# Patient Record
Sex: Female | Born: 1965
Health system: Southern US, Community
[De-identification: ages and names within clinical notes are randomized; demographics above are authoritative.]

## PROBLEM LIST (undated history)

## (undated) ENCOUNTER — Ambulatory Visit

## (undated) DIAGNOSIS — E119 Type 2 diabetes mellitus without complications: Secondary | ICD-10-CM

## (undated) DIAGNOSIS — E785 Hyperlipidemia, unspecified: Secondary | ICD-10-CM

## (undated) DIAGNOSIS — K5900 Constipation, unspecified: Secondary | ICD-10-CM

## (undated) DIAGNOSIS — K219 Gastro-esophageal reflux disease without esophagitis: Secondary | ICD-10-CM

## (undated) DIAGNOSIS — K649 Unspecified hemorrhoids: Secondary | ICD-10-CM

## (undated) DIAGNOSIS — R42 Dizziness and giddiness: Secondary | ICD-10-CM

## (undated) DIAGNOSIS — K603 Anal fistula: Principal | ICD-10-CM

## (undated) DIAGNOSIS — J302 Other seasonal allergic rhinitis: Secondary | ICD-10-CM

## (undated) DIAGNOSIS — D649 Anemia, unspecified: Secondary | ICD-10-CM

## (undated) DIAGNOSIS — I10 Essential (primary) hypertension: Secondary | ICD-10-CM

## (undated) HISTORY — DX: Anemia, unspecified: D64.9

## (undated) HISTORY — DX: Unspecified hemorrhoids: K64.9

## (undated) HISTORY — DX: Essential (primary) hypertension: I10

## (undated) HISTORY — PX: OTHER SURGICAL HISTORY: SHX169

## (undated) HISTORY — DX: Gastro-esophageal reflux disease without esophagitis: K21.9

---

## 1998-09-02 ENCOUNTER — Emergency Department (HOSPITAL_COMMUNITY): Admission: EM | Admit: 1998-09-02 | Discharge: 1998-09-02 | Payer: Self-pay | Admitting: Emergency Medicine

## 1999-10-05 ENCOUNTER — Encounter: Payer: Self-pay | Admitting: Emergency Medicine

## 1999-10-05 ENCOUNTER — Emergency Department (HOSPITAL_COMMUNITY): Admission: EM | Admit: 1999-10-05 | Discharge: 1999-10-05 | Payer: Self-pay | Admitting: Emergency Medicine

## 1999-10-07 ENCOUNTER — Emergency Department (HOSPITAL_COMMUNITY): Admission: EM | Admit: 1999-10-07 | Discharge: 1999-10-07 | Payer: Self-pay | Admitting: Emergency Medicine

## 1999-10-09 ENCOUNTER — Emergency Department (HOSPITAL_COMMUNITY): Admission: EM | Admit: 1999-10-09 | Discharge: 1999-10-09 | Payer: Self-pay | Admitting: Emergency Medicine

## 2000-10-26 ENCOUNTER — Emergency Department (HOSPITAL_COMMUNITY): Admission: EM | Admit: 2000-10-26 | Discharge: 2000-10-26 | Payer: Self-pay

## 2001-12-04 ENCOUNTER — Ambulatory Visit (HOSPITAL_COMMUNITY): Admission: RE | Admit: 2001-12-04 | Discharge: 2001-12-04 | Payer: Self-pay | Admitting: Internal Medicine

## 2004-03-30 ENCOUNTER — Other Ambulatory Visit: Admission: RE | Admit: 2004-03-30 | Discharge: 2004-03-30 | Payer: Self-pay | Admitting: Obstetrics and Gynecology

## 2004-11-10 ENCOUNTER — Ambulatory Visit: Payer: Self-pay | Admitting: Internal Medicine

## 2005-04-18 ENCOUNTER — Other Ambulatory Visit: Admission: RE | Admit: 2005-04-18 | Discharge: 2005-04-18 | Payer: Self-pay | Admitting: Obstetrics and Gynecology

## 2005-07-13 ENCOUNTER — Ambulatory Visit (HOSPITAL_COMMUNITY): Admission: RE | Admit: 2005-07-13 | Discharge: 2005-07-13 | Payer: Self-pay | Admitting: Obstetrics and Gynecology

## 2006-02-01 ENCOUNTER — Other Ambulatory Visit: Admission: RE | Admit: 2006-02-01 | Discharge: 2006-02-01 | Payer: Self-pay | Admitting: Obstetrics and Gynecology

## 2006-08-01 ENCOUNTER — Ambulatory Visit (HOSPITAL_COMMUNITY): Admission: RE | Admit: 2006-08-01 | Discharge: 2006-08-01 | Payer: Self-pay | Admitting: Obstetrics and Gynecology

## 2007-01-10 ENCOUNTER — Ambulatory Visit (HOSPITAL_COMMUNITY): Admission: RE | Admit: 2007-01-10 | Discharge: 2007-01-10 | Payer: Self-pay | Admitting: Obstetrics and Gynecology

## 2007-01-10 ENCOUNTER — Encounter (INDEPENDENT_AMBULATORY_CARE_PROVIDER_SITE_OTHER): Payer: Self-pay | Admitting: Specialist

## 2008-01-15 ENCOUNTER — Emergency Department (HOSPITAL_COMMUNITY): Admission: EM | Admit: 2008-01-15 | Discharge: 2008-01-15 | Payer: Self-pay | Admitting: Emergency Medicine

## 2008-10-25 ENCOUNTER — Emergency Department (HOSPITAL_COMMUNITY): Admission: EM | Admit: 2008-10-25 | Discharge: 2008-10-25 | Payer: Self-pay | Admitting: Emergency Medicine

## 2009-01-30 ENCOUNTER — Inpatient Hospital Stay (HOSPITAL_COMMUNITY): Admission: EM | Admit: 2009-01-30 | Discharge: 2009-02-01 | Payer: Self-pay | Admitting: Emergency Medicine

## 2009-01-31 ENCOUNTER — Ambulatory Visit: Payer: Self-pay | Admitting: Gastroenterology

## 2009-01-31 ENCOUNTER — Encounter: Payer: Self-pay | Admitting: Gastroenterology

## 2009-02-01 ENCOUNTER — Ambulatory Visit: Payer: Self-pay | Admitting: Oncology

## 2009-02-09 LAB — CBC & DIFF AND RETIC
BASO%: 1.5 % (ref 0.0–2.0)
HCT: 35.2 % (ref 34.8–46.6)
IRF: 0.36 — ABNORMAL HIGH (ref 0.130–0.330)
MCHC: 33.8 g/dL (ref 31.5–36.0)
MONO#: 0.6 10*3/uL (ref 0.1–0.9)
RBC: 4.09 10*6/uL (ref 3.70–5.45)
RDW: 15.1 % — ABNORMAL HIGH (ref 11.2–14.5)
RETIC #: 40.9 10*3/uL (ref 19.7–115.1)
Retic %: 1 % (ref 0.4–2.3)
WBC: 7.2 10*3/uL (ref 3.9–10.3)
lymph#: 2 10*3/uL (ref 0.9–3.3)

## 2009-02-09 LAB — CHCC SMEAR

## 2009-02-09 LAB — MORPHOLOGY

## 2009-02-11 LAB — IMMUNOFIXATION ELECTROPHORESIS
IgA: 199 mg/dL (ref 68–378)
IgM, Serum: 149 mg/dL (ref 60–263)

## 2009-02-11 LAB — IRON AND TIBC: Iron: 52 ug/dL (ref 42–145)

## 2009-02-24 ENCOUNTER — Emergency Department (HOSPITAL_COMMUNITY): Admission: EM | Admit: 2009-02-24 | Discharge: 2009-02-24 | Payer: Self-pay | Admitting: Emergency Medicine

## 2009-03-11 ENCOUNTER — Ambulatory Visit: Payer: Self-pay | Admitting: Internal Medicine

## 2009-03-11 DIAGNOSIS — D509 Iron deficiency anemia, unspecified: Secondary | ICD-10-CM

## 2009-03-11 DIAGNOSIS — J309 Allergic rhinitis, unspecified: Secondary | ICD-10-CM | POA: Insufficient documentation

## 2009-03-11 DIAGNOSIS — R51 Headache: Secondary | ICD-10-CM

## 2009-03-11 DIAGNOSIS — R55 Syncope and collapse: Secondary | ICD-10-CM | POA: Insufficient documentation

## 2009-03-11 DIAGNOSIS — R519 Headache, unspecified: Secondary | ICD-10-CM | POA: Insufficient documentation

## 2009-03-18 ENCOUNTER — Ambulatory Visit: Payer: Self-pay | Admitting: Internal Medicine

## 2009-03-18 ENCOUNTER — Observation Stay (HOSPITAL_COMMUNITY): Admission: EM | Admit: 2009-03-18 | Discharge: 2009-03-20 | Payer: Self-pay | Admitting: Emergency Medicine

## 2009-03-22 ENCOUNTER — Encounter: Payer: Self-pay | Admitting: Internal Medicine

## 2009-04-19 ENCOUNTER — Emergency Department (HOSPITAL_COMMUNITY): Admission: EM | Admit: 2009-04-19 | Discharge: 2009-04-19 | Payer: Self-pay | Admitting: Emergency Medicine

## 2009-04-22 ENCOUNTER — Ambulatory Visit: Payer: Self-pay | Admitting: Internal Medicine

## 2009-04-22 ENCOUNTER — Ambulatory Visit: Payer: Self-pay | Admitting: Oncology

## 2009-04-26 LAB — CBC & DIFF AND RETIC
Basophils Absolute: 0.1 10*3/uL (ref 0.0–0.1)
EOS%: 2.2 % (ref 0.0–7.0)
HGB: 13 g/dL (ref 11.6–15.9)
IRF: 0.32 (ref 0.130–0.330)
MCH: 30.9 pg (ref 25.1–34.0)
NEUT#: 3.1 10*3/uL (ref 1.5–6.5)
RDW: 15.9 % — ABNORMAL HIGH (ref 11.2–14.5)
RETIC #: 60.5 10*3/uL (ref 19.7–115.1)
lymph#: 1.9 10*3/uL (ref 0.9–3.3)

## 2009-04-26 LAB — IRON AND TIBC: TIBC: 351 ug/dL (ref 250–470)

## 2009-04-28 ENCOUNTER — Ambulatory Visit (HOSPITAL_COMMUNITY): Admission: RE | Admit: 2009-04-28 | Discharge: 2009-04-28 | Payer: Self-pay | Admitting: General Surgery

## 2009-04-28 ENCOUNTER — Encounter (INDEPENDENT_AMBULATORY_CARE_PROVIDER_SITE_OTHER): Payer: Self-pay | Admitting: General Surgery

## 2009-04-28 HISTORY — PX: HEMORRHOID SURGERY: SHX153

## 2009-05-19 ENCOUNTER — Telehealth: Payer: Self-pay | Admitting: Internal Medicine

## 2009-05-19 ENCOUNTER — Ambulatory Visit: Payer: Self-pay | Admitting: Internal Medicine

## 2009-05-24 ENCOUNTER — Telehealth: Payer: Self-pay | Admitting: Internal Medicine

## 2009-05-31 ENCOUNTER — Telehealth: Payer: Self-pay | Admitting: Internal Medicine

## 2009-06-23 ENCOUNTER — Ambulatory Visit: Payer: Self-pay | Admitting: Internal Medicine

## 2009-06-23 DIAGNOSIS — IMO0001 Reserved for inherently not codable concepts without codable children: Secondary | ICD-10-CM

## 2009-06-24 ENCOUNTER — Ambulatory Visit: Payer: Self-pay | Admitting: Oncology

## 2009-06-28 LAB — CBC WITH DIFFERENTIAL/PLATELET
BASO%: 0.9 % (ref 0.0–2.0)
EOS%: 2.1 % (ref 0.0–7.0)
Eosinophils Absolute: 0.1 10*3/uL (ref 0.0–0.5)
HCT: 41.5 % (ref 34.8–46.6)
HGB: 14.1 g/dL (ref 11.6–15.9)
MCV: 91.1 fL (ref 79.5–101.0)
MONO#: 0.4 10*3/uL (ref 0.1–0.9)
NEUT#: 2.3 10*3/uL (ref 1.5–6.5)
NEUT%: 47.4 % (ref 38.4–76.8)
Platelets: 280 10*3/uL (ref 145–400)
WBC: 4.8 10*3/uL (ref 3.9–10.3)

## 2009-06-29 ENCOUNTER — Telehealth: Payer: Self-pay | Admitting: Internal Medicine

## 2009-06-29 ENCOUNTER — Emergency Department (HOSPITAL_COMMUNITY): Admission: EM | Admit: 2009-06-29 | Discharge: 2009-06-29 | Payer: Self-pay | Admitting: Emergency Medicine

## 2009-06-29 DIAGNOSIS — R609 Edema, unspecified: Secondary | ICD-10-CM | POA: Insufficient documentation

## 2009-06-30 ENCOUNTER — Encounter (INDEPENDENT_AMBULATORY_CARE_PROVIDER_SITE_OTHER): Payer: Self-pay | Admitting: *Deleted

## 2009-07-01 ENCOUNTER — Telehealth: Payer: Self-pay | Admitting: Internal Medicine

## 2009-07-07 ENCOUNTER — Ambulatory Visit: Payer: Self-pay | Admitting: Internal Medicine

## 2009-07-07 DIAGNOSIS — T148XXA Other injury of unspecified body region, initial encounter: Secondary | ICD-10-CM

## 2009-07-14 ENCOUNTER — Telehealth: Payer: Self-pay | Admitting: Internal Medicine

## 2009-07-21 ENCOUNTER — Ambulatory Visit: Payer: Self-pay | Admitting: Internal Medicine

## 2009-07-28 ENCOUNTER — Telehealth: Payer: Self-pay | Admitting: Internal Medicine

## 2009-08-01 ENCOUNTER — Ambulatory Visit: Payer: Self-pay | Admitting: Surgery

## 2009-08-01 ENCOUNTER — Encounter: Payer: Self-pay | Admitting: Internal Medicine

## 2009-08-10 ENCOUNTER — Ambulatory Visit: Payer: Self-pay | Admitting: Internal Medicine

## 2009-08-17 ENCOUNTER — Emergency Department (HOSPITAL_COMMUNITY): Admission: EM | Admit: 2009-08-17 | Discharge: 2009-08-17 | Payer: Self-pay | Admitting: Emergency Medicine

## 2009-08-19 ENCOUNTER — Ambulatory Visit: Payer: Self-pay | Admitting: Oncology

## 2009-08-23 ENCOUNTER — Encounter: Admission: RE | Admit: 2009-08-23 | Discharge: 2009-08-23 | Payer: Self-pay | Admitting: Cardiology

## 2009-09-05 LAB — CBC & DIFF AND RETIC
BASO%: 1.6 % (ref 0.0–2.0)
Basophils Absolute: 0.1 10*3/uL (ref 0.0–0.1)
EOS%: 2.5 % (ref 0.0–7.0)
HGB: 13.8 g/dL (ref 11.6–15.9)
Immature Retic Fract: 1.7 % (ref 0.00–10.70)
MCH: 30.7 pg (ref 25.1–34.0)
MONO%: 7.1 % (ref 0.0–14.0)
RBC: 4.49 10*6/uL (ref 3.70–5.45)
RDW: 13 % (ref 11.2–14.5)
Retic %: 1.01 % (ref 0.50–1.50)
Retic Ct Abs: 45.35 10*3/uL (ref 18.30–72.70)
lymph#: 2.1 10*3/uL (ref 0.9–3.3)

## 2009-09-05 LAB — IRON AND TIBC: TIBC: 306 ug/dL (ref 250–470)

## 2009-09-15 ENCOUNTER — Encounter: Admission: RE | Admit: 2009-09-15 | Discharge: 2009-11-02 | Payer: Self-pay | Admitting: Cardiology

## 2009-12-13 ENCOUNTER — Ambulatory Visit (HOSPITAL_COMMUNITY): Admission: RE | Admit: 2009-12-13 | Discharge: 2009-12-13 | Payer: Self-pay | Admitting: Neurosurgery

## 2009-12-16 ENCOUNTER — Encounter: Admission: RE | Admit: 2009-12-16 | Discharge: 2009-12-16 | Payer: Self-pay | Admitting: Neurosurgery

## 2010-01-04 ENCOUNTER — Emergency Department (HOSPITAL_COMMUNITY): Admission: EM | Admit: 2010-01-04 | Discharge: 2010-01-04 | Payer: Self-pay | Admitting: Emergency Medicine

## 2010-01-24 ENCOUNTER — Other Ambulatory Visit: Admission: RE | Admit: 2010-01-24 | Discharge: 2010-01-24 | Payer: Self-pay | Admitting: Obstetrics and Gynecology

## 2010-04-19 ENCOUNTER — Ambulatory Visit (HOSPITAL_COMMUNITY): Admission: RE | Admit: 2010-04-19 | Discharge: 2010-04-19 | Payer: Self-pay | Admitting: Obstetrics and Gynecology

## 2010-04-21 ENCOUNTER — Ambulatory Visit (HOSPITAL_COMMUNITY): Admission: RE | Admit: 2010-04-21 | Discharge: 2010-04-21 | Payer: Self-pay | Admitting: Orthopedic Surgery

## 2010-05-16 ENCOUNTER — Encounter: Admission: RE | Admit: 2010-05-16 | Discharge: 2010-05-16 | Payer: Self-pay | Admitting: Orthopedic Surgery

## 2010-12-31 ENCOUNTER — Inpatient Hospital Stay (INDEPENDENT_AMBULATORY_CARE_PROVIDER_SITE_OTHER)
Admission: RE | Admit: 2010-12-31 | Discharge: 2010-12-31 | Disposition: A | Discharge status: Home / Self Care | Payer: BC Managed Care – PPO | Source: Ambulatory Visit | Attending: Emergency Medicine | Admitting: Emergency Medicine

## 2010-12-31 DIAGNOSIS — H9209 Otalgia, unspecified ear: Secondary | ICD-10-CM

## 2010-12-31 DIAGNOSIS — J029 Acute pharyngitis, unspecified: Secondary | ICD-10-CM

## 2011-01-19 ENCOUNTER — Other Ambulatory Visit (HOSPITAL_COMMUNITY)
Admission: RE | Admit: 2011-01-19 | Discharge: 2011-01-19 | Disposition: A | Discharge status: Home / Self Care | Payer: BC Managed Care – PPO | Source: Ambulatory Visit | Attending: Obstetrics and Gynecology | Admitting: Obstetrics and Gynecology

## 2011-01-19 ENCOUNTER — Other Ambulatory Visit: Payer: Self-pay | Admitting: Obstetrics and Gynecology

## 2011-01-19 DIAGNOSIS — Z113 Encounter for screening for infections with a predominantly sexual mode of transmission: Secondary | ICD-10-CM | POA: Insufficient documentation

## 2011-01-19 DIAGNOSIS — Z01419 Encounter for gynecological examination (general) (routine) without abnormal findings: Secondary | ICD-10-CM | POA: Insufficient documentation

## 2011-02-08 LAB — CK TOTAL AND CKMB (NOT AT ARMC)
CK, MB: 0.6 ng/mL (ref 0.3–4.0)
Total CK: 45 U/L (ref 7–177)

## 2011-02-12 LAB — CBC
HCT: 39.9 % (ref 36.0–46.0)
Hemoglobin: 13.5 g/dL (ref 12.0–15.0)
MCHC: 33.8 g/dL (ref 30.0–36.0)
MCV: 89.9 fL (ref 78.0–100.0)
RBC: 4.44 MIL/uL (ref 3.87–5.11)
WBC: 5.8 10*3/uL (ref 4.0–10.5)

## 2011-02-12 LAB — URINALYSIS, ROUTINE W REFLEX MICROSCOPIC
Hgb urine dipstick: NEGATIVE
Ketones, ur: NEGATIVE mg/dL
Specific Gravity, Urine: 1.025 (ref 1.005–1.030)
Urobilinogen, UA: 0.2 mg/dL (ref 0.0–1.0)

## 2011-02-12 LAB — DIFFERENTIAL
Basophils Relative: 1 % (ref 0–1)
Eosinophils Absolute: 0.1 10*3/uL (ref 0.0–0.7)
Eosinophils Relative: 2 % (ref 0–5)
Monocytes Relative: 6 % (ref 3–12)

## 2011-02-12 LAB — POCT I-STAT, CHEM 8
Chloride: 104 mEq/L (ref 96–112)
Creatinine, Ser: 0.7 mg/dL (ref 0.4–1.2)
HCT: 37 % (ref 36.0–46.0)
Potassium: 3.6 mEq/L (ref 3.5–5.1)
TCO2: 25 mmol/L (ref 0–100)

## 2011-02-12 LAB — PREGNANCY, URINE: Preg Test, Ur: NEGATIVE

## 2011-02-12 LAB — URINE CULTURE

## 2011-02-13 LAB — URINALYSIS, ROUTINE W REFLEX MICROSCOPIC
Bilirubin Urine: NEGATIVE
Ketones, ur: NEGATIVE mg/dL
Nitrite: NEGATIVE
Protein, ur: NEGATIVE mg/dL
pH: 6.5 (ref 5.0–8.0)

## 2011-02-13 LAB — COMPREHENSIVE METABOLIC PANEL
ALT: 11 U/L (ref 0–35)
AST: 15 U/L (ref 0–37)
AST: 16 U/L (ref 0–37)
Albumin: 3.4 g/dL — ABNORMAL LOW (ref 3.5–5.2)
Albumin: 3.5 g/dL (ref 3.5–5.2)
Alkaline Phosphatase: 37 U/L — ABNORMAL LOW (ref 39–117)
Alkaline Phosphatase: 41 U/L (ref 39–117)
BUN: 2 mg/dL — ABNORMAL LOW (ref 6–23)
Creatinine, Ser: 0.61 mg/dL (ref 0.4–1.2)
GFR calc Af Amer: >60 mL/min (ref >60)
Potassium: 3.7 mEq/L (ref 3.5–5.1)
Potassium: 3.8 mEq/L (ref 3.5–5.1)
Sodium: 140 mEq/L (ref 135–145)
Total Protein: 6.6 g/dL (ref 6.0–8.3)
Total Protein: 6.6 g/dL (ref 6.0–8.3)

## 2011-02-13 LAB — CBC
HCT: 36.5 % (ref 36.0–46.0)
Hemoglobin: 11.6 g/dL — ABNORMAL LOW (ref 12.0–15.0)
MCHC: 34 g/dL (ref 30.0–36.0)
MCHC: 34.3 g/dL (ref 30.0–36.0)
MCV: 87.3 fL (ref 78.0–100.0)
MCV: 87.5 fL (ref 78.0–100.0)
Platelets: 191 10*3/uL (ref 150–400)
RBC: 4.15 MIL/uL (ref 3.87–5.11)
RDW: 17.3 % — ABNORMAL HIGH (ref 11.5–15.5)
RDW: 17.9 % — ABNORMAL HIGH (ref 11.5–15.5)
RDW: 18 % — ABNORMAL HIGH (ref 11.5–15.5)
WBC: 4.3 10*3/uL (ref 4.0–10.5)
WBC: 6 10*3/uL (ref 4.0–10.5)

## 2011-02-13 LAB — POCT I-STAT, CHEM 8
BUN: 3 mg/dL — ABNORMAL LOW (ref 6–23)
Calcium, Ion: 1.15 mmol/L (ref 1.12–1.32)
HCT: 39 % (ref 36.0–46.0)
Hemoglobin: 13.3 g/dL (ref 12.0–15.0)
Sodium: 141 mEq/L (ref 135–145)
TCO2: 23 mmol/L (ref 0–100)

## 2011-02-13 LAB — IRON AND TIBC
Saturation Ratios: 21 % (ref 20–55)
TIBC: 357 ug/dL (ref 250–470)

## 2011-02-13 LAB — DIFFERENTIAL
Basophils Absolute: 0.1 10*3/uL (ref 0.0–0.1)
Basophils Relative: 1 % (ref 0–1)
Eosinophils Absolute: 0.1 10*3/uL (ref 0.0–0.7)
Eosinophils Relative: 2 % (ref 0–5)
Lymphocytes Relative: 37 % (ref 12–46)
Lymphs Abs: 1.6 10*3/uL (ref 0.7–4.0)
Monocytes Relative: 6 % (ref 3–12)
Neutrophils Relative %: 53 % (ref 43–77)

## 2011-02-13 LAB — CK: Total CK: 55 U/L (ref 7–177)

## 2011-02-13 LAB — CK TOTAL AND CKMB (NOT AT ARMC)
CK, MB: 0.5 ng/mL (ref 0.3–4.0)
Total CK: 54 U/L (ref 7–177)

## 2011-02-13 LAB — FERRITIN: Ferritin: 32 ng/mL (ref 10–291)

## 2011-02-13 LAB — TYPE AND SCREEN
ABO/RH(D): O POS
Antibody Screen: NEGATIVE

## 2011-02-13 LAB — LIPID PANEL
LDL Cholesterol: 117 mg/dL — ABNORMAL HIGH (ref 0–99)
VLDL: 13 mg/dL (ref 0–40)

## 2011-02-13 LAB — HEMOCCULT GUIAC POC 1CARD (OFFICE): Fecal Occult Bld: POSITIVE

## 2011-02-13 LAB — FOLATE: Folate: >20 ng/mL

## 2011-02-13 LAB — GC/CHLAMYDIA PROBE AMP, URINE: Chlamydia, Swab/Urine, PCR: NEGATIVE

## 2011-02-13 LAB — PROTIME-INR: INR: 1 (ref 0.00–1.49)

## 2011-02-13 LAB — LACTIC ACID, PLASMA: Lactic Acid, Venous: 1.1 mmol/L (ref 0.5–2.2)

## 2011-02-14 LAB — RAPID URINE DRUG SCREEN, HOSP PERFORMED
Amphetamines: NOT DETECTED
Barbiturates: NOT DETECTED
Cocaine: NOT DETECTED
Opiates: NOT DETECTED
Tetrahydrocannabinol: NOT DETECTED

## 2011-02-14 LAB — POCT I-STAT, CHEM 8
Chloride: 108 mEq/L (ref 96–112)
Glucose, Bld: 97 mg/dL (ref 70–99)
HCT: 36 % (ref 36.0–46.0)
Potassium: 4.2 mEq/L (ref 3.5–5.1)

## 2011-02-14 LAB — URINALYSIS, ROUTINE W REFLEX MICROSCOPIC
Bilirubin Urine: NEGATIVE
Leukocytes, UA: NEGATIVE
Nitrite: NEGATIVE
Specific Gravity, Urine: 1.021 (ref 1.005–1.030)
pH: 5.5 (ref 5.0–8.0)

## 2011-02-14 LAB — DIFFERENTIAL
Basophils Absolute: 0.1 10*3/uL (ref 0.0–0.1)
Basophils Relative: 1 % (ref 0–1)
Eosinophils Relative: 2 % (ref 0–5)
Monocytes Absolute: 0.3 10*3/uL (ref 0.1–1.0)

## 2011-02-14 LAB — URINE MICROSCOPIC-ADD ON

## 2011-02-14 LAB — CBC
HCT: 34.3 % — ABNORMAL LOW (ref 36.0–46.0)
Hemoglobin: 11.8 g/dL — ABNORMAL LOW (ref 12.0–15.0)
MCHC: 34.5 g/dL (ref 30.0–36.0)
Platelets: 465 10*3/uL — ABNORMAL HIGH (ref 150–400)
RDW: 15.9 % — ABNORMAL HIGH (ref 11.5–15.5)

## 2011-02-15 LAB — COMPREHENSIVE METABOLIC PANEL
ALT: 11 U/L (ref 0–35)
AST: 19 U/L (ref 0–37)
Albumin: 3.5 g/dL (ref 3.5–5.2)
Alkaline Phosphatase: 30 U/L — ABNORMAL LOW (ref 39–117)
BUN: 3 mg/dL — ABNORMAL LOW (ref 6–23)
Creatinine, Ser: 0.63 mg/dL (ref 0.4–1.2)
GFR calc non Af Amer: >60 mL/min (ref >60)
Glucose, Bld: 118 mg/dL — ABNORMAL HIGH (ref 70–99)
Total Bilirubin: 1.1 mg/dL (ref 0.3–1.2)

## 2011-02-15 LAB — HEMOGLOBIN AND HEMATOCRIT, BLOOD
HCT: 26.9 % — ABNORMAL LOW (ref 36.0–46.0)
HCT: 31.9 % — ABNORMAL LOW (ref 36.0–46.0)
Hemoglobin: 10.7 g/dL — ABNORMAL LOW (ref 12.0–15.0)
Hemoglobin: 7.9 g/dL — CL (ref 12.0–15.0)
Hemoglobin: 8.9 g/dL — ABNORMAL LOW (ref 12.0–15.0)

## 2011-02-15 LAB — CBC
HCT: 19.8 % — ABNORMAL LOW (ref 36.0–46.0)
HCT: 28.8 % — ABNORMAL LOW (ref 36.0–46.0)
Hemoglobin: 6.5 g/dL — CL (ref 12.0–15.0)
Hemoglobin: 9.4 g/dL — ABNORMAL LOW (ref 12.0–15.0)
MCHC: 32.5 g/dL (ref 30.0–36.0)
MCHC: 33 g/dL (ref 30.0–36.0)
MCV: 86.8 fL (ref 78.0–100.0)
RBC: 2.28 MIL/uL — ABNORMAL LOW (ref 3.87–5.11)
RDW: 15.4 % (ref 11.5–15.5)

## 2011-02-15 LAB — URINALYSIS, ROUTINE W REFLEX MICROSCOPIC
Bilirubin Urine: NEGATIVE
Ketones, ur: NEGATIVE mg/dL
Leukocytes, UA: NEGATIVE
Nitrite: NEGATIVE
Protein, ur: 30 mg/dL — AB
Urobilinogen, UA: 0.2 mg/dL (ref 0.0–1.0)

## 2011-02-15 LAB — CROSSMATCH: ABO/RH(D): O POS

## 2011-02-15 LAB — BASIC METABOLIC PANEL
CO2: 26 mEq/L (ref 19–32)
Calcium: 8.3 mg/dL — ABNORMAL LOW (ref 8.4–10.5)
Chloride: 108 mEq/L (ref 96–112)
GFR calc Af Amer: >60 mL/min (ref >60)
Potassium: 3.5 mEq/L (ref 3.5–5.1)
Sodium: 138 mEq/L (ref 135–145)

## 2011-02-15 LAB — PROTEIN ELECTROPH W RFLX QUANT IMMUNOGLOBULINS
Beta Globulin: 6.9 % (ref 4.7–7.2)
Gamma Globulin: 14 % (ref 11.1–18.8)
M-Spike, %: NOT DETECTED g/dL
Total Protein ELP: 6.1 g/dL (ref 6.0–8.3)

## 2011-02-15 LAB — DIFFERENTIAL
Basophils Absolute: 0 10*3/uL (ref 0.0–0.1)
Eosinophils Absolute: 0.1 10*3/uL (ref 0.0–0.7)
Eosinophils Relative: 2 % (ref 0–5)
Lymphocytes Relative: 18 % (ref 12–46)
Monocytes Absolute: 0.3 10*3/uL (ref 0.1–1.0)

## 2011-02-15 LAB — VITAMIN B12: Vitamin B-12: 340 pg/mL (ref 211–911)

## 2011-02-15 LAB — RETICULOCYTES: Retic Ct Pct: 1.5 % (ref 0.4–3.1)

## 2011-02-15 LAB — HEMOCCULT GUIAC POC 1CARD (OFFICE): Fecal Occult Bld: NEGATIVE

## 2011-02-15 LAB — LACTATE DEHYDROGENASE: LDH: 121 U/L (ref 94–250)

## 2011-02-15 LAB — URINE CULTURE

## 2011-02-15 LAB — PREPARE RBC (CROSSMATCH)

## 2011-03-20 NOTE — Op Note (Signed)
[name], [name]                   ACCOUNT NO.:  [id number]   MEDICAL RECORD NO.:  [id number]          PATIENT TYPE:  INP   LOCATION:  5531                         FACILITY:  [facility]   PHYSICIAN:  Anselmo Rod, M.D.  DATE OF BIRTH:  December 21, 1965   DATE OF PROCEDURE:  03/20/2009  DATE OF DISCHARGE:  03/20/2009                               OPERATIVE REPORT   PROCEDURE PERFORMED:  Flexible sigmoidoscopy up to 70 cm.   ENDOSCOPIST:  Anselmo Rod, M.D.   INSTRUMENT USED:  Pentax panendoscope.   INDICATION FOR PROCEDURE:  A 45 year old white female with a history of  iron deficiency anemia and rectal bleeding undergoing a flexible  sigmoidoscopy for ongoing bleeding.  Patient had a colonoscopy in March  that was unrevealing except for hemorrhoids.   PREPROCEDURE PREPARATION:  Informed consent was procured from the  patient and the patient had fasted for 8 hours prior to the procedure  and prepped with MiraLax the night prior to the procedure.  Risks and  benefits of the procedure were discussed with the patient in great  detail.   PREPROCEDURE PHYSICAL:  VITAL SIGNS:  Stable.  NECK:  Supple.  CHEST:  Clear to auscultation.  S1, S2 regular.  ABDOMEN:  Soft with normal bowel sounds.   DESCRIPTION OF THE PROCEDURE:  The patient was placed in the left  lateral decubitus position and was sedated for the EGD with Fentanyl and  Versed.  No other additional sedation was given for the flexible  sigmoidoscopy.  Once the patient was adequately positioned, the Pentax  video panendoscope was advanced from the rectum to 70 cm without  difficulty.  No masses, polyps, erosions, ulcerations or diverticula  were seen.  Large internal hemorrhoids were seen on retroflexion with  small external hemorrhoids on anal inspection.  The patient tolerated  the procedure well without immediate complications.   IMPRESSION:  Small external hemorrhoids with large internal hemorrhoids.  No other abnormalities  seen up to 70 cm.   RECOMMENDATIONS:  The patient had been advised to continue her iron  supplements and follow up with me on an outpatient basis.  She will be  referred to the surgeon for an hemorrhoidectomy and a daily diary will  be  maintained after the surgery is done.  If she continues to have rectal  bleeding once the hemorrhoidectomy is done, a capsule study may be in  order.  Further recommendation to be made in followup.  I have discussed  these plans with Dr. Reynold Bowen who is on for the teaching service today  and she agrees with my plans.      Anselmo Rod, M.D.  Electronically Signed     JNM/MEDQ  D:  03/20/2009  T:  03/20/2009  Job:  [id number]

## 2011-03-20 NOTE — Assessment & Plan Note (Signed)
OFFICE VISIT   [name], [name]  DOB:  Dec 05, 1965                                       08/01/2009  ZOXWR#:[id number]   REASON FOR VISIT:  Leg pain and swelling.   REFERRING PHYSICIAN:  Dr. Sanda Linger   HISTORY:  This is a 45 year old female that I am seeing at the request  of Dr. Yetta Barre for evaluation of bilateral leg and thigh pain, left  greater than right as well as swelling.  The patient states that this  began on August 19 and this was not associated with any trauma..  Her  biggest complaint is of pain around her left knee.  She does have some  mild swelling.  She is now walking with a cane, she has tried multiple  medications including ibuprofen and muscle relaxants as well as  elevation and has had no benefit.  The only thing that helps her is rest  and sleeping.  This is not associated with walking as it occurs at night  while she is in bed.  She has never had anything like this before.   PAST MEDICAL HISTORY:  Allergic rhinitis, iron deficiency anemia and  headaches.   PAST SURGICAL HISTORY:  Is a hemorrhoidectomy.   SOCIAL HISTORY:  No tobacco and no alcohol.   REVIEW OF SYSTEMS:  GENERAL:  Negative for fevers, chills, weight gain  or weight loss.  CARDIAC:  Negative.  PULMONARY:  Negative.  GI:  Positive for constipation.  GU:  Negative.  VASCULAR:  Negative.  NEURO:  Positive for headaches.  Ortho:  Negative.  PSYCH:  Negative.  ENT:  Negative.  HEME:  Positive for anemia.   MEDICATIONS:  Include prenatal vitamins, promethazine and iron.   ALLERGIES:  ARE TO PENICILLIN, SULFA, TYLENOL, __________, ASPIRIN,  CALCIUM AND FIORICET.   PHYSICAL EXAMINATION:  VITAL SIGNS:  Blood pressure is 131/78, pulse 82.  GENERAL:  She is well-appearing in no distress.  CARDIOVASCULAR:  Regular rate and rhythm, respirations nonlabored.  EXTREMITIES:  Warm, well-perfused.  She has palpable posterior tibial  pulses bilaterally.  There is minimal edema.  She  has no ulcerations.  PSYCH:  She is alert and oriented x3.  NEURO:  Without focal deficits.  SKIN:  Without rash.   DIAGNOSTIC STUDIES:  A venous ultrasound was performed today.  There is  no evidence of deep vein thrombosis.  There is no evidence of reflux.   ASSESSMENT/PLAN:  Bilateral leg pain left greater than right.   I had a long conversation with the patient today and I told her that it  was unclear to me as to the etiology of her pain.  However, I reassured  her that she does not have any form of vascular compromise. She has no  arterial insufficiency and her venous system is working well.  I have  recommended that if she has continued problems with swelling that she  would need to be placed in compression stockings.  I have given her a  prescription for 20-30-mm compression.  I also told her that I think it  would be appropriate for her to return to work.  Again I have reassured  her that she does not have a vascular component to her complaints.   Jorge Ny, MD  Electronically Signed   VWB/MEDQ  D:  08/01/2009  T:  08/02/2009  Job:  [id number]   cc:   Dr. Sanda Linger

## 2011-03-20 NOTE — H&P (Signed)
[name], [name]                   ACCOUNT NO.:  [id number]   MEDICAL RECORD NO.:  [id number]          PATIENT TYPE:  INP   LOCATION:  0104                         FACILITY:  [facility]   PHYSICIAN:  Oswald Hillock, MD        DATE OF BIRTH:  [birth year]   DATE OF ADMISSION:  01/30/2009  DATE OF DISCHARGE:                              HISTORY & PHYSICAL   CHIEF COMPLAINT:  Generalized weakness.   HISTORY OF PRESENT ILLNESS:  The patient is a 45 year old African  American female who presents to the emergency room with a 1 week history  of generalized weakness.  Apparently she had an upper respiratory  infection about a week back that resolved 4 days ago but she persisted  with this generalized weakness.  She does admit to bleeding PR and  states that in the past she has had hemorrhoids and she would use  Preparation H and symptoms would resolve.  However, this time around she  continues to have bleeding PR and does not report any recent history of  having hemorrhoids though.  She denies any vomiting, has taken Advil  occasionally over the last few days, last dose was yesterday.  She  denies any black tarry stools.  No chest pain, shortness of breath,  palpitations, diaphoresis, loss of consciousness or any focal weakness  of any part of the body.  The patient does not use any over-the-counter  medications and has seen her ob/gyn in the recent past.   PAST MEDICAL HISTORY:  Is significant for:  1. Iron deficiency anemia thought to be secondary to menorrhagia.  2. History of hemorrhoids.   PAST SURGICAL HISTORY:  The patient had cervical dilatation,  hysteroscopy, uterine curettage followed by NovaSure endometrial  ablation done in 2008 and incision and drainage of thrombosed  hemorrhoid.   CURRENT MEDICATIONS:  None.   ALLERGIES:  1. TYLENOL.  2. PENICILLIN.  3. SULFA.   FAMILY HISTORY:  No history of hypertension, diabetes mellitus or anemia  in the family.   SOCIAL HISTORY:  No  history of tobacco use.  Drinks alcohol socially.  No history of drug use.  Lives with family.   REVIEW OF SYSTEMS:  An extensive review of systems was done and all  systems are negative except for the positives mentioned in history of  present illness.   PHYSICAL EXAMINATION:  VITAL SIGNS:  On admission pulse 93, blood  pressure 121/80, respiratory rate of 20, temperature 98.4, O2 sats of  100% on room air.  GENERAL:  The patient is conscious, alert, oriented to time, place and  person.  Pale looking.  HEENT:  Significant pallor.  No scleral icterus.  NECK:  Neck is supple.  No lymphadenopathy.  No JVD.  Mucous membranes  are moist.  No significant oropharyngeal lesions.  CHEST:  Breath sounds heard bilaterally.  Good air entry.  No added  sounds.  CVS:  S1, S2 plus regular.  No gallop or rub.  ABDOMEN:  Abdomen is soft.  There is deep tenderness in the lower  quadrants, however, no  guarding or rebound.  Bowel sounds present.  EXTREMITIES:  No cyanosis, clubbing or edema.  RECTAL:  Exam done by the ER physician did not reveal any significant  abnormalities and hemoccult was reported negative.   LABORATORY DATA:  Stool for occult blood negative.  WBC count 4.3,  hemoglobin 6.5, hematocrit 19.8, platelet count 331.  Sodium 138,  potassium 3.5, chloride 108, CO2 26, glucose 87, BUN 6, creatinine 0.72.  Urinalysis showed cloudy appearance, negative nitrite, negative  leukocyte esterase.   IMPRESSION AND PLAN:  This is a case of a 45 year old African American  female who presents with a 1 week history of generalized progressive  weakness and admits to bleeding PR for the same duration.  1. Symptomatic anemia with bleeding PR.  Need to rule out      gastrointestinal bleed.  The patient will be admitted to the      medical service for further evaluation and management.  Given the      abdominal tenderness on clinical exam will get a CT scan of her      abdomen and pelvis and follow up  the results.  The patient will be      typed and cross matched for 4 units of packed red blood cells.      Will transfuse 2 units.  Will also do baseline blood work including      checking a reticulocyte count, iron studies, B12 and folate levels.      We will consult gastroenterology and follow with their      recommendations as well.  Will start the patient on Protonix 40 mg      IV q.8 h and give her a dose now and monitor her H and H q.6 h x24      hours.  2. DVT/GI prophylaxis.  Prophylaxis is not indicated, the patient is      already on Protonix for GI prophylaxis.      Oswald Hillock, MD  Electronically Signed     BA/MEDQ  D:  01/30/2009  T:  01/30/2009  Job:  [id number]   cc:   PCP

## 2011-03-20 NOTE — Procedures (Signed)
DUPLEX DEEP VENOUS EXAM - LOWER EXTREMITY   INDICATION:  Pedal edema.   HISTORY:  Edema:  Yes.  Trauma/Surgery:  No.  Pain:  Yes.  PE:  No.  Previous DVT:  No.  Anticoagulants:  No.  Other:   DUPLEX EXAM:                CFV   SFV   PopV  PTV    GSV                R  L  R  L  R  L  R   L  R  L  Thrombosis    o  o  o  o  o  o  o   o  o  o  Spontaneous   +  +  +  +  +  +  +   +  +  +  Phasic        +  +  +  +  +  +  +   +  +  +  Augmentation  +  +  +  +  +  +  +   +  +  +  Compressible  +  +  +  +  +  +  +   +  +  +  Competent     +  +  +  +  +  +  +   +  +  +   Legend:  + - yes  o - no  p - partial  D - decreased   IMPRESSION:  No evidence of deep venous thrombosis bilaterally.    _____________________________  V. Charlena Cross, MD   CJ/MEDQ  D:  08/01/2009  T:  08/01/2009  Job:  [id number]

## 2011-03-20 NOTE — Discharge Summary (Signed)
[name], [name]                   ACCOUNT NO.:  [id number]   MEDICAL RECORD NO.:  [id number]          PATIENT TYPE:  INP   LOCATION:  1305                         [facility]ACILITY[facility]   PHYSICIAN:  Altha Harm, MDDATE OF BIRTH:  10/25/1966   DATE OF ADMISSION:  01/30/2009  DATE OF DISCHARGE:  02/01/2009                               DISCHARGE SUMMARY   DISCHARGE DISPOSITION:  Home.   FINAL DISCHARGE DIAGNOSES:  1. Normocytic normochromic anemia, etiology unclear.  2. External thrombosed hemorrhoids.  3. Uterine curettage followed by endometrial ablation done in 2008.   DISCHARGE MEDICATIONS:  1. Preparation H applied topically once daily until hemorrhoids      resolve.  2. Fiber supplements 1 tablespoon of Citracal mixed in with water      daily.  3. Over-the-counter stool softener daily.  4. Over-the-counter laxatives such MiraLax on an as needed basis.   CONSULTANT:  Rachael Fee, MD.   PROCEDURES:  1. EGD.  2. Colonoscopy.  3. Status post 4 units of packed red blood cells.   DIAGNOSTIC STUDIES:  CT of the abdomen and pelvis with contrast done on  admission which shows left renal calculi, otherwise negative CT of the  abdomen and trace pelvic fluid.   CODE STATUS:  Full code.   ALLERGIES:  1. PENICILLIN  2. SULFA.  3. TYLENOL.   PRIMARY CARE PHYSICIAN:  Unassigned.   OB/GYN PHYSICIAN:  Dr. Alvester Morin.   PRESENTING COMPLAINT:  Generalized weakness.   HISTORY OF PRESENT ILLNESS:  Please see the H and P by Dr. Ninfa Linden for  details of the HPI.   HOSPITAL COURSE:  This is a patient who presented with a profound anemia  with a hemoglobin of 6.5 and hematocrit of 19.8.  The patient had no  active bleeding and was found to be fecal blood tested negative.  Nevertheless given her historical report of having blood in her stools  secondary to her external hemorrhoids, Gastroenterology was consulted.  They did both an EGD and colonoscopy.  The colonoscopy showed  internal/external hemorrhoids.  However, it showed a normal terminal  ileum and no active bleeding.  The EGD was essentially unremarkable.  The patient during this hospitalization had no active bleeding.  However  during the hospitalization and despite her transfusion, her hemoglobin  dropped down to 7.5.  The patient was symptomatic from this and thus was  transfused 2 units of packed red blood cells which brought her  hemoglobin up to 10.7 as noted today.  The patient denies any abdominal  pain.  She denied any back pain, any chest pain.  The patient had no  focal neurological deficits.  An LDH was done which was found to be  normal at 121 and an iron panel was also done with a serum iron normal  at 76.  I felt that this patient warranted a hematologic workup,  however, this did not require hospitalization.  Thus I spoke with Dr.  Cephas Darby of Hematology who agreed that the patient would  benefit from a hematologic workup in the outpatient setting.  He has  requested immunoelectrophoresis.  We will give the patient a container  to collect her urine at the direction of Dr. Cyndie Chime as to the  timing of when he wants that done.  However prior to leaving the  hospital, I will order serum immunoelectrophoresis to be drawn and can  be followed up by Dr. Cyndie Chime in the outpatient setting.  I have  counseled the patient on symptoms of low hemoglobin and when to present  to her  OB/GYN to have her hemoglobin tested.  I do not believe that the  patient should have random testing of her hemoglobin, but rather she  should be symptom treated at this time.   DISCHARGE INSTRUCTIONS:  1. The patient will get her records from Dr. Shannan Harper office including      the procedures done, her last hemoglobin and hematocrit that were      done for comparison.  We are also going to request that the fax to      be sent to Dr. Patsy Lager office.  2. In terms of followup, Dr. Patsy Lager office  will contact the      patient to set up an appointment for her as an outpatient.  The      patient does not need routine follow up with Gastroenterology, but      only as needed.  3. Dietary restrictions:  The patient should be on a high-fiber diet      and has been counseled to take a fiber supplement on a daily basis.      The patient will probably benefit from also using a laxative or      stool softener given the fact that she does have hemorrhoids and      that has been instructed to her  4. Physical restrictions are none.  The patient is able to return to      work without restrictions at this time.   Total time for discharge 42 minutes.      Altha Harm, MD  Electronically Signed     MAM/MEDQ  D:  02/01/2009  T:  02/01/2009  Job:  [id number]   cc:   Genene Churn. Cyndie Chime, M.D.  Fax: [phone]   Rachael Fee, MD  [address]  [place], [place] [address]

## 2011-03-20 NOTE — Consult Note (Signed)
NAMEMCKAYLEE, [name]                   ACCOUNT NO.:  [id number]   MEDICAL RECORD NO.:  [id number]          PATIENT TYPE:  INP   LOCATION:  1305                         [facility]AC[facility]   PHYSICIAN:  Jordan Hawks. Elnoria Howard, MD    DATE OF BIRTH:  [birth year]   DATE OF CONSULTATION:  01/30/2009  DATE OF DISCHARGE:                                 CONSULTATION   This is a GI consultation for  GI performed on January 30, 2009.   REASON FOR CONSULTATION:  Iron deficiency anemia.   HISTORY OF PRESENT ILLNESS:  This is a 45 year old female with a past  medical history of menorrhagia status post NovaSure endometrial ablation  in March 2008 or 2009 and history of hemorrhoids who was admitted to the  hospital with complaints of weakness.  The patient states that she was  stepping out of the shower and felt persistently weak and subsequently  presented to the emergency room for further evaluation and treatment.  While in the emergency room, the patient was noted to have a hemoglobin  of 6.5.  She complains of having hematochezia that has been ongoing and  it is a chronic issue which she has always attributed to her  hemorrhoids.  However, during the rectal examination in the emergency  room, there are no obvious findings and she was noted to be heme  negative.  In the past in 2008, she is documented to have a low blood  count of some in the 7 range with MCV in the 69 range and also an  elevated platelet count in the 700 range,  but she has not had any blood  transfusions in the past.  No complaints of chest pain or shortness of  breath but she does feel weak.  No known family history of colon cancer.  She does use Advil for severe headaches and this is every few days but  she denies taking it on a daily basis.  The patient did undergo a  NovaSure endometrial ablation in the past and this has decreased the  amount of bleeding and it appears that her periods are irregular at this  time.   PAST MEDICAL  HISTORY AND PAST SURGICAL HISTORY:  As stated above.   FAMILY HISTORY:  Noncontributory.   SOCIAL HISTORY:  Negative for tobacco, illicit drug use.  Mild alcohol  use.   ALLERGIES:  TYLENOL, PENICILLIN and SULFA.   HOME MEDICATIONS:  None.   REVIEW OF SYSTEMS:  As stated above in history of present illness,  otherwise negative.   PHYSICAL EXAMINATION:  VITAL SIGNS:  Blood pressure is 121/77, heart  rate 89, respirations 13.  GENERAL:  The patient is in no acute distress, alert and oriented.  HEENT:  Normocephalic, atraumatic.  Extraocular muscles intact.  NECK:  Supple.  No lymphadenopathy.  LUNGS:  Clear to auscultation bilaterally.  CARDIOVASCULAR:  Regular rate and rhythm.  ABDOMEN:  Flat and soft, some tenderness in the suprapubic region.  No  rebound or rigidity.  Positive bowel sounds.  EXTREMITIES:  No clubbing, cyanosis or edema.  RECTAL:  Examination is deferred by the patient as she just had a rectal  exam by the ER physician.  Apparently, there are no reported  abnormalities.   LABORATORY VALUES:  White blood cell count is 4.3, hemoglobin 6.5, MCV  is 86.8, platelets at 331.  Sodium is 138, potassium 3.5, chloride 108,  CO2 26, glucose is 87, BUN 6, creatinine 0.7.  Urinalysis is negative  and Hemoccult is negative.   IMPRESSION:  1. Severe iron-deficiency anemia.  2. Hematochezia.  3. History of menorrhagia.   Apparently, the patient's menorrhagia has decreased and there is this  history of hematochezia with every bowel movement.  In the past, she has  tried Preparation H and Tucks medicated pad with some benefit.  Her last  bloody bowel movement was yesterday; however, the physical examination  by the ER physician was negative for any findings of blood.  Unfortunately, she did not want me to reexamine her rectum at this time.  Because of this severe anemia and the rectal bleeding, I believe it  would be prudent to perform an esophagogastroduodenoscopy and   colonoscopy.   PLAN:  Plan is for the EGD and colonoscopy tomorrow, do blood  transfusions and further recommendations pending the findings.      Jordan Hawks Elnoria Howard, MD  Electronically Signed     PDH/MEDQ  D:  01/30/2009  T:  01/30/2009  Job:  [id number]

## 2011-03-20 NOTE — Consult Note (Signed)
[name], [name]                   ACCOUNT NO.:  [id number]   MEDICAL RECORD NO.:  [id number]           PATIENT TYPE:   LOCATION:                                 FACILITY:   PHYSICIAN:  Anselmo Rod, M.D.  DATE OF BIRTH:  03/19/1966   DATE OF CONSULTATION:  03/19/2009  DATE OF DISCHARGE:                                 CONSULTATION   REASON FOR CONSULTATION:  Rectal bleeding with severe anemia requiring 4  units of blood transfusion.   ASSESSMENT:  1. Rectal bleeding with anemia and a history of hemorrhoids, rule out      other courses of gastrointestinal blood loss.  2. Status post endometrial ablation for menorrhagia on January 10, 2007.  3. Right lower quadrant pain of unclear etiology.  4. History of proctalgia fugax.  5. Severe nausea/vomiting with epigastric pain, rule out peptic ulcer      disease.   RECOMMENDATIONS:  1. EGD with possible flex sig in the morning.  2. We will plan a capsule study if these tests are unrevealing.  3. We will prep the patient tonight.   DISCUSSION:  [name] is a pleasant black female who was  admitted to [facility] in March where she had similar symptoms  except for her hemoglobin was down to 6.1 g/dL.  She had an EGD and  colonoscopy done that were essentially unrevealing except for the  presence of external and internal hemorrhoids.  Patient received 4 units  of blood at the time and was discharged home; however, she continued to  have nausea with epigastric pain and was readmitted for rectal bleeding.  This time around, her hemoglobin was 12.4 and dropped to 11.6.  Patient  claims she is concerned about the source of blood loss and abdominal  discomfort that she has been having along with the nausea and vomiting.  She has 1 soft bowel movement per day with occasional bouts of  constipation.  She has not been on iron supplements through all of this.  Patient had a near syncopal episode this time around and in March  which  prompted her to come to the hospital.   PAST MEDICAL HISTORY:  See list above.   ALLERGIES:  1. PENICILLIN.  2. SULFA.  3. TYLENOL.   ALL OF WHICH SEEM TO CAUSE A RASH.   MEDICATIONS:  She takes iron supplements on a daily basis.   SOCIAL HISTORY:  She does not smoke.  She drinks alcohol very rarely on  social occasions and works at the [employer], Diplomatic Services operational officer.  She is single  with 2 grown children and lives with her mother.   FAMILY HISTORY:  Her children are healthy.  Her father died of an MI at  64.  She has a brother with diabetes.  Her mother has diabetes and  hypertension.  There is no family history of colon cancer, breast,  ovarian, or uterine cancer.   REVIEW OF SYSTEMS:  1. Epigastric and right lower quadrant pain.  2. Rectal bleeding.  3. Rectal pain.  4. Near  syncopal event prior to admission.   PHYSICAL EXAMINATION:  GENERAL:  Patient is a very pleasant,  cooperative, 45 year old black female in no acute distress with stable  vital signs.  NECK:  Supple.  CHEST:  Clear to auscultation.  S1 and S2 regular.  ABDOMEN:  Soft with epigastric and right lower quadrant tenderness on  palpation with guarding but no rebound or rigidity.  No  hepatosplenomegaly.  RECTAL:  Examination reveals small external hemorrhoids with no evidence  of an anal fissure.  Stools showed guaiac negative on guaiac testing.   LABORATORY EVALUATION:  On admission revealed a hemoglobin of 12.4 which  is down to 7.6.  Labs are otherwise unimpressive.  Lipid profile  revealed a cholesterol of 176 with triglycerides of 65, HDL of 46, LDL  of 107.  CMP done on Mar 18, 2009, revealed sodium of 140, potassium of  3.7, chloride 108, CO2 of 25, BUN 5, glucose 86, creatinine 0.7, AST of  16, ALT of 11, and alk phos of 37, albumin 3.4, calcium 8.6.  Anemia  panel done on Mar 18, 2009, also revealed a vitamin B12 level of 496,  iron of 74, TIBC of 357, percent saturation 21, retic count of  0.9%.  Stool was guaiac positive on admission, guaiac negative subsequently.   PLAN:  As above.  Further recommendations to be made in followup.      Anselmo Rod, M.D.  Electronically Signed     JNM/MEDQ  D:  03/20/2009  T:  03/20/2009  Job:  [id number]

## 2011-03-20 NOTE — Op Note (Signed)
NAMEMARTIN, SMEAL                   ACCOUNT NO.:  [id number]   MEDICAL RECORD NO.:  [id number]          PATIENT TYPE:  AMB   LOCATION:  DAY                          FACILITY:  [facility]   PHYSICIAN:  Lennie Muckle, MD      DATE OF BIRTH:  Mar 10, 1966   DATE OF PROCEDURE:  04/28/2009  DATE OF DISCHARGE:  04/19/2009                               OPERATIVE REPORT   PREOPERATIVE DIAGNOSIS:  Grade 2-3 internal hemorrhoids.   POSTOPERATIVE DIAGNOSIS:  Grade 2-3 internal hemorrhoids.   PROCEDURE:  PPH.   SURGEON:  Lennie Muckle, MD.   ASSISTANTWilmon Arms. Tsuei, M.D.   ANESTHESIA:  Endotracheal anesthesia.   FINDINGS:  Large internal hemorrhoids at 2, 5 and 7 o'clock.   SPECIMEN:  Hemorrhoids.   ESTIMATED BLOOD LOSS:  Minimal amount of blood loss.   COMPLICATIONS:  No immediate complications.   DRAINS:  No drains were placed.   INDICATIONS FOR PROCEDURE:  Ms. Sottile is a 45 year old female who had had  multiple episodes of perirectal bleeding.  She was found to have large  internal hemorrhoids.  This seemed somewhat circumferential on  examination in the office.  I had talked her about a stapled PPH.  The  risks of the surgery including but not limited to bleeding, infection,  pain were explained to the patient.  Informed consent was obtained.   DETAILS OF PROCEDURE:  Ms. Calia was identified in the preoperative  holding area.  The patient had 400 mg of Cipro.  She was then taken to  the operating room.  Once in the operating room, placed in a supine  position.  After administration of general endotracheal anesthesia, she  was placed in the prone position.  She was placed in the jackknife  position with buttock cheeks taped apart.  I then prepped the rectum  using Betadine and saline.  Time-out procedure indicating the patient  and procedure was performed.  Using 1 mL of Wydase and 9 mL of 0.25%  Marcaine, I injected the hemorrhoidal piles.  I had finger dilated the  rectum up to 3  digits prior to injecting.  After injecting the largest  bundles at approximately 2 and 5 o'clock, there was a smaller bundle  injected at approximately 7 o'clock.  I then injected the sphincters  circumferentially.  I used approximately 40 mL of Marcaine.  I then used  a 2 Prolene suture, placed a pursestring measuring 4 cm, from the  dentate line.  This was placed with the help of Dr. Corliss Skains.  The stapler  was then introduced through the pursestring.  I tied the pursestring  around the stapling device.  The stapler was deployed and held for 40  seconds.  I checked the vagina prior to performing the stapling.  The  stapler did not appear to impinge upon the vaginal wall. It was then  fired without difficulty.  The stapler was removed along with the  retractor intact.  The staple line was intact.  Hemorrhoidal tissue was  identified.  No musculature was identified.  We  then placed a Ray-Tec  within the rectum.  The staple line was inspected and we found no  evidence of bleeding.  A Gelfoam was placed in the rectum.  The patient  was extubated, transferred to the post anesthesia care unit in stable  condition.  She will be given Vicodin for pain, instructed to take  Colace and MiraLax.  I placed her off work for approximately 2 weeks.  See me back in 3 weeks.      Lennie Muckle, MD  Electronically Signed     ALA/MEDQ  D:  04/28/2009  T:  04/28/2009  Job:  [id number]   cc:   Sanda Linger, MD  [address] [place] [address] [place] [address]

## 2011-03-20 NOTE — Op Note (Signed)
[name], [name]                   ACCOUNT NO.:  [id number]   MEDICAL RECORD NO.:  [id number]          PATIENT TYPE:  INP   LOCATION:  5531                         FACILITY:  [facility]   PHYSICIAN:  Anselmo Rod, M.D.  DATE OF BIRTH:  1966/02/18   DATE OF PROCEDURE:  03/20/2009  DATE OF DISCHARGE:  03/20/2009                               OPERATIVE REPORT   PROCEDURE PERFORMED:  Esophagogastroduodenoscopy.   ENDOSCOPIST:  Anselmo Rod, MD.   INSTRUMENT USED:  Pentax video panendoscope.   INDICATION FOR PROCEDURE:  Iron deficiency anemia and rectal bleeding in  a 45 year old black female with a history of ongoing epigastric pain,  nausea, and blood in stool undergoing EGD to rule out peptic ulcer  disease, esophagitis, gastritis, etc.   PRE-PROCEDURE PREPARATION:  Informed consent was procured from the  patient.  The patient was fasted for 8 hours prior to the procedure.  Risks and benefits of the procedure were discussed with the patient in  great detail.   PREPROCEDURE PHYSICAL:  VITAL SIGNS:  Patient had stable vital signs.  NECK:  Supple.  CHEST:  Clear to auscultation, S1 S2 regular.  ABDOMEN:  Soft with normal bowel sounds.   DESCRIPTION OF PROCEDURE:  The patient was placed in the left lateral  decubitus position, sedated with 100 mcg of Fentanyl and 7.5 mg of  Versed given intravenously in slow incremental doses.  Once the patient  was adequately sedated and maintained on low flow oxygen and continuous  cardiac monitoring, the Pentax video panendoscope was advanced through  the mouthpiece over the tongue and into the esophagus under direct  vision.  The entire esophagus was widely patent with no evidence of  ring, stricture, mass, esophagitis, or Barrett's mucosa.  The Z-line  appeared healthy.  The scope was then advanced into the stomach.  The  entire gastric mucosa and the proximal small bowel appeared normal.  There was no obvious obstruction.  No source of blood  loss was  identified.   IMPRESSION:  Normal esophagogastroduodenoscopy.   RECOMMENDATIONS:  Proceed with a flexible sigmoidoscopy at this time.  Further recommendations will made thereafter.      Anselmo Rod, M.D.  Electronically Signed     JNM/MEDQ  D:  03/20/2009  T:  03/20/2009  Job:  [id number]

## 2011-03-23 NOTE — Op Note (Signed)
NAMETEDRA, COPPERNOLL NO.:  [id number]   MEDICAL RECORD NO.:  [id number]          PATIENT TYPE:  AMB   LOCATION:  SDC                           FACILITY:  [facility]   PHYSICIAN:  Miguel Aschoff, M.D.       DATE OF BIRTH:  11/09/1965   DATE OF PROCEDURE:  01/10/2007  DATE OF DISCHARGE:                               OPERATIVE REPORT   PREOPERATIVE DIAGNOSES:  1. Menorrhagia.  2. Thrombosis of hemorrhoid.   PROCEDURES:  1. Cervical dilatation, hysteroscopy, uterine curettage, followed by      NovaSure endometrial ablation.  2. Incision and drainage of thrombosed hemorrhoid   SURGEON:  Miguel Aschoff, MD   ANESTHESIA:  General.   COMPLICATIONS:  None.   JUSTIFICATION:  The patient is a 45 year old black female with a history  of very heavy menses with passage of clots that has not responded to  conservative treatment.  She presents now to undergo cervical  dilatation, hysteroscopy, uterine curettage, and endometrial ablation in  an effort to control her very heavy bleeding.  Incidentally, the patient  had been noted to have a very painful thrombosed hemorrhoid that has  been treated with topical medication but has not responded.  At this  time she is going to have this thrombosis I&D'd in an effort to control  her perianal pain.  The risks and benefits of all these procedures have  been discussed with the patient.   PROCEDURE:  The patient was taken to the operating room and placed in a  supine position, where general anesthesia was administered without  difficulty.  She was then placed in the dorsal lithotomy position and  prepped and draped in the usual sterile fashion.  After this was done,  an examination under anesthesia was carried out.  This revealed normal  external genitalia, normal Bartholin and Skene's glands, normal urethra.  The vaginal vault was without gross lesion.  The cervix was without  gross lesion.  The uterus was top normal size and globular,  smooth and  regular in shape.  No adnexal masses were noted.  The patient obviously  had a large thrombosed hemorrhoid at the 12 o'clock position.  No other  abnormalities were noted.  At this point the speculum was placed in  vaginal vault and the anterior  cervical lip was grasped with a  tenaculum and then the endocervical canal was sounded to 9 cm.  A  cervical length 4 cm was determined for a cavity length of 5 cm.  Once  this was done, the cervix was further dilated and the diagnostic  hysteroscope was advanced.  No endocervical lesions were noted.  No  endometrial lesions were noted, either.  The cavity was smooth and  regular.  No polyps or submucous myomas were found.  At this point the  hysteroscope was removed, sharp vigorous curettage was carried out.  The  tissue was sent for histologic study.  At this point the endometrial  ablation unit was placed through the cervix.  The NovaSure array was  then opened and a cavity  width of 3.8 cm was determined.  At this point  a treatment cycle for 60 seconds at 105 watts was carried out  successfully and without difficulty.  The instrument was removed  following the procedure.  It was inspected and appeared to be intact.  At this point, hysteroscopy was done again.  The cavity appeared be  completely coagulated.  At this point attention was directed to the  large hemorrhoid.  It was elevated and the area of the thrombosis was  incised and the clot was expressed.  Once this was done the area was  injected with 1% Xylocaine for relief of patient discomfort.  The  patient will continue to use topical medications on this area.   After this was done, the patient was reversed the anesthetic, taken to  the recovery room in satisfactory condition.  Medications for home are  Darvocet-N 100 one every 4 hours as needed for pain, doxycycline twice a  day x5 days, benzocaine spray to use topically on the hemorrhoid, Anusol-  HC 2.5% cream crane to  be used b.i.d. on the hemorrhoid \,  the  patient is to use an ice pack tonight and continue with sitz baths.  She  will be seen back in 3 or 4 weeks.  She is to call for any problems such  as fever, pain or heavy bleeding.  The patient tolerated the procedure  well.  The blood loss from the procedures was less than 30 mL.      Miguel Aschoff, M.D.  Electronically Signed     AR/MEDQ  D:  01/10/2007  T:  01/10/2007  Job:  [id number]

## 2011-03-23 NOTE — Discharge Summary (Signed)
Diane, Dawson NO.:  [id number]   MEDICAL RECORD NO.:  [id number]          PATIENT TYPE:  INP   LOCATION:  5531                         FACILITY:  [facility]   PHYSICIAN:  Ileana Roup, M.D.  DATE OF BIRTH:  06/23/1966   DATE OF ADMISSION:  03/18/2009  DATE OF DISCHARGE:  03/20/2009                               DISCHARGE SUMMARY   ATTENDING PHYSICIAN:  Ileana Roup, M.D.   DISCHARGE DIAGNOSES:  1. Lower gastrointestinal bleeding most likely secondary to large      internal hemorrhoids, hemoglobin at the time of discharge 11.6,      stable status post esophagogastroduodenoscopy and flexible      sigmoidoscopy, to follow up with General Surgery as an outpatient      for hemorrhoidectomy and outpatient gastrointestinal followup.  2. Right lower quadrant abdominal pain of unclear etiology, to      followup with Dr. Loreta Ave as an outpatient.  3. Chronic iron deficiency anemia, secondary to hemorrhoidal bleeding,      ferritin 32 on this admission, to continue ferrous sulfate therapy.  4. History of thrombosed hemorrhoids in March 2008, status post      incision and drainage.  5. History of menorrhagia, status post endometrial ablation and      curettage.  6. History of recent admission in March 2008 for a hemoglobin of 6.5,      status post 4 units of blood transfusion, negative      esophagogastroduodenoscopy and negative CT abdomen, colonoscopy      showing internal hemorrhoids and external hemorrhoids.  7. History of seizure disorder in childhood.   She is allergic to PENICILLIN, SULFA and TYLENOL.   DISCHARGE MEDICATIONS:  1. Ferrous sulfate 325 mg 1 pill 3 times daily.  2. Colace 100 mg 1 pill twice daily.  3. Claritin 10 mg 1 pill once daily.   The patient is advised if the rectal bleeding continues after  hemorrhoidectomy, the patient is to see Dr. Loreta Ave to perform capsule  endoscopy.   DISPOSITION ON FOLLOWUP:  The patient is to follow up with  Dr. Bertram Savin, of [facility] on Mar 22, 2009, at 11:30 a.m. [phone] for a possible hemorrhoidectomy.  The patient is to follow up with  Dr. Loreta Ave as an outpatient if the rectal bleeding continues.  The patient  is also advised to follow up with her primary care physician, Dr. Sanda Linger, in 2 weeks to check her hemoglobin.   PROCEDURES PERFORMED:  1. Flexible sigmoidoscopy, Mar 20, 2009, impression, small external      hemorrhoids with large internal hemorrhoids.  No abnormalities are      seen up to 70 cm.  2. Esophagogastroduodenoscopy.  Normal esophagogastroduodenoscopy.   Fasting lipid panel; total cholesterol 176, LDL cholesterol 117, HDL  cholesterol 46, LDH 137, lactic acid is 1.1.  Anemia panel; TIBC 357,  serum ferritin 32, vitamin B12 496.  Folate greater than 20.  Urine  pregnancy test is negative.  Fecal occult blood test is positive.   CONSULTATIONS:  GI (Dr. Loreta Ave).   BRIEF ADMITTING HISTORY AND PHYSICAL:  The patient is a 45 year old lady  with a past medical history significant for internal as well as external  hemorrhoids, status post colonoscopy in March 2010, status post 4 units  of PRBC transfusion in March 2010, comes to the emergency with chief  complaint of rectal bleed.  The patient's complaint started yesterday  afternoon when she had a big bloody bowel movement.  The patient denies  any nausea, vomiting, diarrhea, or constipation.  On the morning of  admission, she had another bowel movement at around 6 o'clock in the  morning associated with bright red blood and some stool.  Also  complaints of perianal pain after the bowel movement but denies any  constipation, nausea, vomiting, or diarrhea.  The patient reports  feeling dizzy after this episode and decided to come to the emergency.  The patient complaints of right lower quadrant abdominal pain, crampy,  total 3/10 in severity, intermittent, occurs every 5-10 minutes.  Denies  any  fever, chills, palpitation, shortness of breath, cough, chest pain,  or dysuria.  The patient denies any other complaints.   PHYSICAL EXAMINATION:  VITAL SIGNS:  Temperature 99.9, blood pressure  122/86, pulse rate of 89, respiratory rate of 16, O2 saturation 99% on  room air.  GENERAL:  The patient is not in any acute distress.  EYES:  Pupils equal, round and reacting to light.  Extraocular movements  are intact.  ENT:  Moist mucous membranes.  No sinus tenderness.  NECK:  Supple.  RESPIRATORY:  Clear to auscultation bilaterally.  CARDIOVASCULAR:  Regular in rate and rhythm.  No murmurs.  No rubs.  No  gallops.  Good peripheral pulses.  GASTROINTESTINAL:  Abdomen is soft, nondistended, nontender.  Mild  tenderness to palpation in the right lower quadrant of abdomen.  No  guarding.  No rigidity.  Bowel sounds positive.  EXTREMITIES:  No pedal edema.  GENITOURINARY:  No CVA tenderness.  External hemorrhoids palpable at 5  o'clock and 8 o'clock position.  NEUROLOGIC:  The patient is alert and oriented x3.  Motor strength is  5/5 in all 4 extremities.  Sensation is intact.  Gait is normal.  PSYCHIATRIC:  Appropriate.   LABORATORY DATA ON ADMISSION:  CBC; white count 4.3, hemoglobin 12.4,  and platelet count 178.  CMP; sodium 140, potassium 3.8, chloride 107,  bicarb 25, glucose 111, BUN 2, creatinine 0.61, total bilirubin 0.6,  alkaline phosphatase 41, AST 15, ALT 11, total protein 6.6, albumin 3.5,  and calcium 8.6.   HOSPITAL COURSE:  1. Lower GI bleeding most likely from large internal hemorrhoids.  The      patient was admitted for the same problem in March 2010 and had an      EGD as well as a colonoscopy that showed normal EGD and internal      and external hemorrhoids.  Given her right lower quadrant abdominal      pain, we consulted Gastroenterology for further workup and      management.  Gastroenterology did a repeat EGD as well as flexible      sigmoidoscopy; EGD was  normal, and flexible sigmoidoscopy showed      large internal as well as small external hemorrhoids.  The patient      is to follow up with Dr. Bertram Savin, on Mar 22, 2009 at 11:30 a.m.      for a possible hemorrhoidectomy.  If the rectal bleeding continues  even after hemorrhoidectomy, the patient is to follow up with GI as      an outpatient for a possible capsule endoscopy.  The patient's      hemoglobin was stable during the hospital stay and she did not have      any further episodes of rectal bleeding.  The patient is to follow      up with her primary care physician, Dr. Sanda Linger, for      monitoring of her hemoglobin.  2. Iron deficiency anemia.  The patient's ferritin level during this      admission is 32 and given her on going losses of rectal bleeding      from the hemorrhoids, we continued her iron supplementation      therapy.  The patient is to follow up with her primary care      physician and is to check her hemoglobin as well as ferritin levels      in 2-3 months.   DISCHARGE VITALS:  Temperature 97.9, pulse rate of 78, respiratory rate  19, blood pressure 110/77, oxygen saturation 97% on room air.  The CBC  on the day prior to discharge, white count 4.6, hemoglobin 11.6,  platelet count 164.  The patient on the day of discharge is alert and  oriented x3, and he is not in any acute distress.  He is completely  asymptomatic.  The patient is to follow up with [facility]  with Dr. Bertram Savin, on May 18, and is to take all the medications  mentioned in the discharge instructions.      Blondell Reveal, MD  Electronically Signed      Ileana Roup, M.D.  Electronically Signed    VB/MEDQ  D:  03/21/2009  T:  03/22/2009  Job:  [id number]   cc:   Rachael Fee, MD  Anselmo Rod, M.D.  Lennie Muckle, MD  Sanda Linger, MD

## 2012-04-07 ENCOUNTER — Other Ambulatory Visit (HOSPITAL_COMMUNITY)
Admission: RE | Admit: 2012-04-07 | Discharge: 2012-04-07 | Disposition: A | Discharge status: Home / Self Care | Payer: BC Managed Care – PPO | Source: Ambulatory Visit | Attending: Obstetrics and Gynecology | Admitting: Obstetrics and Gynecology

## 2012-04-07 ENCOUNTER — Other Ambulatory Visit: Payer: Self-pay | Admitting: Obstetrics and Gynecology

## 2012-04-07 DIAGNOSIS — Z01419 Encounter for gynecological examination (general) (routine) without abnormal findings: Secondary | ICD-10-CM | POA: Insufficient documentation

## 2012-04-07 DIAGNOSIS — Z1159 Encounter for screening for other viral diseases: Secondary | ICD-10-CM | POA: Insufficient documentation

## 2012-04-08 ENCOUNTER — Other Ambulatory Visit: Payer: Self-pay | Admitting: Obstetrics and Gynecology

## 2012-04-08 DIAGNOSIS — Z1231 Encounter for screening mammogram for malignant neoplasm of breast: Secondary | ICD-10-CM

## 2012-04-16 ENCOUNTER — Other Ambulatory Visit: Payer: Self-pay | Admitting: Obstetrics and Gynecology

## 2012-05-01 ENCOUNTER — Ambulatory Visit (HOSPITAL_COMMUNITY)
Admission: RE | Admit: 2012-05-01 | Discharge: 2012-05-01 | Disposition: A | Discharge status: Home / Self Care | Payer: BC Managed Care – PPO | Source: Ambulatory Visit | Attending: Obstetrics and Gynecology | Admitting: Obstetrics and Gynecology

## 2012-05-01 DIAGNOSIS — Z1231 Encounter for screening mammogram for malignant neoplasm of breast: Secondary | ICD-10-CM | POA: Insufficient documentation

## 2012-07-04 ENCOUNTER — Encounter (HOSPITAL_BASED_OUTPATIENT_CLINIC_OR_DEPARTMENT_OTHER): Payer: Self-pay | Admitting: *Deleted

## 2012-07-04 NOTE — Progress Notes (Signed)
NPO AFTER MN. ARRIVES AT 0945. PRE-OP ORDERS PENDING. (DR VARNADO IS OUT OF OFFICE TODAY). SO I ORDERED HG AND URINE PREG. UNLESS OTHERWISE ORDERED DIFFERENT FROM MD.

## 2012-07-07 ENCOUNTER — Other Ambulatory Visit: Payer: Self-pay | Admitting: Obstetrics and Gynecology

## 2012-07-07 NOTE — H&P (Signed)
07/02/2012  History of Present Illness  General:  46 y/o with chronic vulvar lesion. Previous w/u has included HSV culture, wound culture and biopsy. 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 Pt was given Clobetasol.  Pt states that it still hurts and continues to drain. After examination today, it appears she has a vulvar abscess. She desires I&D under anesthesia.   Current Medications  Claritin 10 MG Tablet 1 tablet Once a day  Atorvastatin Calcium 40 MG Tablet 1 tablet Once a day  Antivert 25.0 Milligram Tablet TAKE 1 TO 2 TABLET BY MOUTH UP TO THREE TIMES DAILY AS NEEDED FOR DIZZINESS   Meclizine HCl 25 MG Tablet Chewable 1 tablet Three times a day  Astepro 0.15% 0.15% Solution 2 puffs in each nostril once daily  Clobetasol Propionate 0.05 % Ointment 1 application to affected area Twice a day for 4 weeks  Quasense 0.15-0.03 MG Tablet TAKE 1 TABLET BY MOUTH DAILY (NEEDS AN APPT. [facility])   Medication List reviewed and reconciled with the patient   Past Medical History  Anemia   Surgical History  Hemorrhoid 2010  back surgery 2010  Novasure 2009   Family History  Mother: hypertension, diabetes   Brother 1: Diabetes   denies any GYN family cancer hx.   Social History  General:  History of smoking cigarettes: Never smoked.  no Smoking.  no Alcohol.  Caffeine: 3-4 servings daily, soda, tea.  no Recreational drug use.  Exercise: walks occasionally.  Occupation: Conservation officer, nature at [employer].  Marital Status: single.  Children: 2.  Seat belt use: yes.    Gyn History  Sexual activity currently sexually active.  Periods : Novasure, takes ocp's all the time.  LMP Takes OCP's continously.  Birth control none.  Last pap smear date 04/07/12, all neg.  Last mammogram date 04/2010.  Denies H/O Abnormal pap smear .  Denies H/O STD .  Menarche 10.    OB History  Number of pregnancies 2.  Pregnancy # 1 live birth, vaginal delivery.  Pregnancy # 2 live  birth, C-section.    Allergies  Tylenol: rash: Allergy  Penicillin (for allergy): anaphylaxis: Allergy  Sulfa drugs (for allergy): anaphylaxis: Allergy  Butalbital-APAP: rash: Allergy   Hospitalization/Major Diagnostic Procedure  bleeding hemorrhoids    Review of Systems  CONSTITUTIONAL:  Fatigue none. Fever none today.  SKIN:  Rash no. Suspicious lesions no.  CARDIOLOGY:  Chest pain none. Murmurs No h/o mitral valve prolapse.  RESPIRATORY:  Shortness of breath no. Cough no.  GASTROENTEROLOGY:  Abdominal pain none. Change in bowel habits no. Constipation No. Gallstones No gallbladder problems. Hepatitis/yellow jaundice No h/o liver problems.  FEMALE REPRODUCTIVE:  Breast lumps or discharge no. Breast pain none. Dyspareunia none. Dysuria no. Irregular menses no . Pelvic pain none. Regular menses yes. Unusual vaginal discharge no. Vaginal itching no.  NEUROLOGY:  Migraines none. Seizures No. Tingling/numbness none.  PSYCHOLOGY:  Depression no.  ENDOCRINOLOGY:  Hot flashes none. Weight gain none. Weight loss none.  HEMATOLOGY/LYMPH:  Anemia no. Blood Clots No h/o blood clots.  DERMATOLOGY:  Acne none.     Vital Signs  Wt 153, Wt change 2 lb, Ht 64.75, BMI 25.65, Pulse sitting 79, BP sitting 144/92.   Physical Examination  GENERAL:  Patient appears alert and oriented.  General Appearance: well-appearing, well-developed, no acute distress.  Speech: clear.  LUNGS:  General clear bilaterally, no crackles, no wheezes.  HEART:  Heart sounds: normal, RRR, no murmur.  ABDOMEN:  General: soft, nontender, nondistended.  FEMALE GENITOURINARY:  Vulva: 2 raised lesion less than 1 cm. Upper lesion draining puralent discharge and tender. Lower lesion without drainage. Fluctuant areat above the top lesion about 2 cm. When abscess compressed pus drained through top lesion.  EXTREMITIES:  General: No edema, no calf tenderness.     Assessments   1. Pre-op exam - V72.84 (Primary)     2. Vulvar abscess - 616.4   Treatment  1. Vulvar abscess  Pt counseled on diagnosis. In that the biopsy was so tender, I recommend surgical management under anesthesia to includ I&D of abscess and removal of to vulvar lesions. Risks, benefits and alternatives discussed. Pt aware she may have a lesion that may need to heal by secondary intension, which would require TID packing of wound.  Referral To:Geryl Rankins [facility] - Gynecology Reason:Precert and schedule Excison of vulvar abscess at [facility]    Follow Up  1 week post op

## 2012-07-08 ENCOUNTER — Encounter (HOSPITAL_BASED_OUTPATIENT_CLINIC_OR_DEPARTMENT_OTHER): Payer: Self-pay | Admitting: *Deleted

## 2012-07-08 ENCOUNTER — Other Ambulatory Visit: Payer: Self-pay | Admitting: Obstetrics and Gynecology

## 2012-07-08 ENCOUNTER — Ambulatory Visit (HOSPITAL_BASED_OUTPATIENT_CLINIC_OR_DEPARTMENT_OTHER)
Admission: RE | Admit: 2012-07-08 | Discharge: 2012-07-08 | Disposition: A | Discharge status: Home / Self Care | Payer: BC Managed Care – PPO | Source: Ambulatory Visit | Attending: Obstetrics and Gynecology | Admitting: Obstetrics and Gynecology

## 2012-07-08 ENCOUNTER — Ambulatory Visit (HOSPITAL_BASED_OUTPATIENT_CLINIC_OR_DEPARTMENT_OTHER): Payer: BC Managed Care – PPO | Admitting: Anesthesiology

## 2012-07-08 ENCOUNTER — Encounter (HOSPITAL_BASED_OUTPATIENT_CLINIC_OR_DEPARTMENT_OTHER): Payer: Self-pay | Admitting: Anesthesiology

## 2012-07-08 ENCOUNTER — Encounter (HOSPITAL_BASED_OUTPATIENT_CLINIC_OR_DEPARTMENT_OTHER)
Admission: RE | Disposition: A | Discharge status: Home / Self Care | Payer: Self-pay | Source: Ambulatory Visit | Attending: Obstetrics and Gynecology

## 2012-07-08 DIAGNOSIS — N764 Abscess of vulva: Secondary | ICD-10-CM | POA: Insufficient documentation

## 2012-07-08 DIAGNOSIS — K611 Rectal abscess: Secondary | ICD-10-CM

## 2012-07-08 DIAGNOSIS — N909 Noninflammatory disorder of vulva and perineum, unspecified: Secondary | ICD-10-CM | POA: Insufficient documentation

## 2012-07-08 HISTORY — PX: IRRIGATION AND DEBRIDEMENT ABSCESS: SHX5252

## 2012-07-08 HISTORY — DX: Hyperlipidemia, unspecified: E78.5

## 2012-07-08 LAB — CBC WITH DIFFERENTIAL/PLATELET
Basophils Absolute: 0.1 10*3/uL (ref 0.0–0.1)
Basophils Relative: 1 % (ref 0–1)
Eosinophils Absolute: 0.1 10*3/uL (ref 0.0–0.7)
HCT: 43 % (ref 36.0–46.0)
MCHC: 33.7 g/dL (ref 30.0–36.0)
Monocytes Absolute: 0.3 10*3/uL (ref 0.1–1.0)
Neutro Abs: 2.4 10*3/uL (ref 1.7–7.7)
Neutrophils Relative %: 43 % (ref 43–77)
RDW: 12.8 % (ref 11.5–15.5)

## 2012-07-08 SURGERY — MINOR INCISION AND DRAINAGE OF ABSCESS
Anesthesia: General | Site: Vulva | Wound class: Clean Contaminated

## 2012-07-08 MED ORDER — CLINDAMYCIN PHOSPHATE 900 MG/50ML IV SOLN
900.0000 mg | INTRAVENOUS | Status: AC
  Administered 2012-07-08: 900 mg via INTRAVENOUS

## 2012-07-08 MED ORDER — PROPOFOL 10 MG/ML IV BOLUS
INTRAVENOUS | Status: DC | PRN
  Administered 2012-07-08: 200 mg via INTRAVENOUS

## 2012-07-08 MED ORDER — ONDANSETRON HCL 4 MG/2ML IJ SOLN
INTRAMUSCULAR | Status: DC | PRN
  Administered 2012-07-08: 4 mg via INTRAVENOUS

## 2012-07-08 MED ORDER — DEXAMETHASONE SODIUM PHOSPHATE 4 MG/ML IJ SOLN
INTRAMUSCULAR | Status: DC | PRN
  Administered 2012-07-08: 8 mg via INTRAVENOUS

## 2012-07-08 MED ORDER — BUPIVACAINE HCL 0.25 % IJ SOLN
INTRAMUSCULAR | Status: DC | PRN
  Administered 2012-07-08: 1 mL

## 2012-07-08 MED ORDER — CIPROFLOXACIN IN D5W 400 MG/200ML IV SOLN
400.0000 mg | INTRAVENOUS | Status: AC
  Administered 2012-07-08: 400 mg via INTRAVENOUS

## 2012-07-08 MED ORDER — IBUPROFEN 600 MG PO TABS: 600.0000 mg | ORAL_TABLET | Freq: Four times a day (QID) | ORAL | Status: AC | PRN

## 2012-07-08 MED ORDER — LIDOCAINE HCL (CARDIAC) 20 MG/ML IV SOLN
INTRAVENOUS | Status: DC | PRN
  Administered 2012-07-08: 50 mg via INTRAVENOUS

## 2012-07-08 MED ORDER — FENTANYL CITRATE 0.05 MG/ML IJ SOLN
25.0000 ug | INTRAMUSCULAR | Status: DC | PRN
  Administered 2012-07-08 (×3): 25 ug via INTRAVENOUS

## 2012-07-08 MED ORDER — LACTATED RINGERS IV SOLN
INTRAVENOUS | Status: DC
  Administered 2012-07-08 (×2): via INTRAVENOUS

## 2012-07-08 MED ORDER — OXYCODONE-ACETAMINOPHEN 5-325 MG PO TABS
1.0000 | ORAL_TABLET | ORAL | Status: DC | PRN
  Administered 2012-07-08: 1 via ORAL

## 2012-07-08 MED ORDER — MIDAZOLAM HCL 5 MG/5ML IJ SOLN
INTRAMUSCULAR | Status: DC | PRN
  Administered 2012-07-08: 2 mg via INTRAVENOUS

## 2012-07-08 MED ORDER — FENTANYL CITRATE 0.05 MG/ML IJ SOLN
INTRAMUSCULAR | Status: DC | PRN
  Administered 2012-07-08 (×3): 25 ug via INTRAVENOUS
  Administered 2012-07-08: 50 ug via INTRAVENOUS
  Administered 2012-07-08: 25 ug via INTRAVENOUS

## 2012-07-08 MED ORDER — LACTATED RINGERS IV SOLN: INTRAVENOUS | Status: DC

## 2012-07-08 MED ORDER — PROMETHAZINE HCL 25 MG/ML IJ SOLN: 6.2500 mg | INTRAMUSCULAR | Status: DC | PRN

## 2012-07-08 MED ORDER — OXYCODONE-ACETAMINOPHEN 5-325 MG PO TABS: ORAL_TABLET | ORAL | Status: DC

## 2012-07-08 SURGICAL SUPPLY — 38 items
APPLICATOR COTTON TIP 6IN NSTR (MISCELLANEOUS) ×1 IMPLANT
BLADE SURG 15 STRL LF DISP TIS (BLADE) ×1 IMPLANT
BLADE SURG 15 STRL SS (BLADE) ×2
CLOTH BEACON ORANGE TIMEOUT ST (SAFETY) ×2 IMPLANT
DRAIN PENROSE 18X1/4 LTX STRL (WOUND CARE) IMPLANT
DRAPE LG THREE QUARTER DISP (DRAPES) ×2 IMPLANT
DRAPE UNDERBUTTOCKS STRL (DRAPE) ×2 IMPLANT
ELECT REM PT RETURN 9FT ADLT (ELECTROSURGICAL)
ELECTRODE REM PT RTRN 9FT ADLT (ELECTROSURGICAL) IMPLANT
GAUZE PACKING 1/4 X5 YD (GAUZE/BANDAGES/DRESSINGS) IMPLANT
GAUZE PACKING IODOFORM 1/2 (PACKING) IMPLANT
GLOVE BIO SURGEON STRL SZ7 (GLOVE) ×2 IMPLANT
GLOVE BIOGEL M 6.5 STRL (GLOVE) ×1 IMPLANT
GLOVE BIOGEL PI IND STRL 7.0 (GLOVE) ×2 IMPLANT
GLOVE BIOGEL PI INDICATOR 7.0 (GLOVE) ×3
GOWN PREVENTION PLUS LG XLONG (DISPOSABLE) ×2 IMPLANT
GOWN PREVENTION PLUS XLARGE (GOWN DISPOSABLE) ×2 IMPLANT
IODIFORM ×1 IMPLANT
LEGGING LITHOTOMY PAIR STRL (DRAPES) ×2 IMPLANT
NDL HYPO 25X1 1.5 SAFETY (NEEDLE) ×1 IMPLANT
NEEDLE HYPO 25X1 1.5 SAFETY (NEEDLE) ×2 IMPLANT
NS IRRIG 500ML POUR BTL (IV SOLUTION) ×2 IMPLANT
PACK BASIN DAY SURGERY FS (CUSTOM PROCEDURE TRAY) ×2 IMPLANT
PAD OB MATERNITY 4.3X12.25 (PERSONAL CARE ITEMS) ×2 IMPLANT
PAD PREP 24X48 CUFFED NSTRL (MISCELLANEOUS) ×2 IMPLANT
PENCIL BUTTON HOLSTER BLD 10FT (ELECTRODE) ×1 IMPLANT
SPONGE GAUZE 4X4 12PLY (GAUZE/BANDAGES/DRESSINGS) ×1 IMPLANT
SUT VIC AB 2-0 SH 27 (SUTURE)
SUT VIC AB 2-0 SH 27XBRD (SUTURE) IMPLANT
SWAB CULTURE LIQ STUART DBL (MISCELLANEOUS) IMPLANT
SYR CONTROL 10ML LL (SYRINGE) IMPLANT
TAPE PAPER 2X10 WHT MICROPORE (GAUZE/BANDAGES/DRESSINGS) ×1 IMPLANT
TOWEL OR 17X24 6PK STRL BLUE (TOWEL DISPOSABLE) ×4 IMPLANT
TRAY DSU PREP LF (CUSTOM PROCEDURE TRAY) ×2 IMPLANT
TUBE ANAEROBIC SPECIMEN COL (MISCELLANEOUS) IMPLANT
TUBE CONNECTING 12X1/4 (SUCTIONS) ×1 IMPLANT
WATER STERILE IRR 500ML POUR (IV SOLUTION) ×1 IMPLANT
YANKAUER SUCT BULB TIP NO VENT (SUCTIONS) ×1 IMPLANT

## 2012-07-08 NOTE — Progress Notes (Signed)
Pt seen and examinded in the holding area.  Pt reports mass continues to drain.  No pain meds required in the last few days.  Procedure reviewed and post op wound care discussed.

## 2012-07-08 NOTE — Anesthesia Preprocedure Evaluation (Signed)
Anesthesia Evaluation  Patient identified by MRN, date of birth, ID band Patient awake    Reviewed: Allergy & Precautions, H&P , NPO status , Patient's Chart, lab work & pertinent test results  Airway Mallampati: II TM Distance: >3 FB Neck ROM: Full    Dental  (+) Teeth Intact and Dental Advisory Given   Pulmonary neg pulmonary ROS,  breath sounds clear to auscultation  Pulmonary exam normal       Cardiovascular negative cardio ROS  Rhythm:Regular Rate:Normal     Neuro/Psych  Headaches,  Neuromuscular disease negative neurological ROS  negative psych ROS   GI/Hepatic negative GI ROS, Neg liver ROS,   Endo/Other  negative endocrine ROS  Renal/GU negative Renal ROS  negative genitourinary   Musculoskeletal negative musculoskeletal ROS (+)   Abdominal   Peds negative pediatric ROS (+)  Hematology negative hematology ROS (+)   Anesthesia Other Findings   Reproductive/Obstetrics negative OB ROS                           Anesthesia Physical Anesthesia Plan  ASA: I  Anesthesia Plan: General   Post-op Pain Management:    Induction: Intravenous  Airway Management Planned: LMA  Additional Equipment:   Intra-op Plan:   Post-operative Plan: Extubation in OR  Informed Consent: I have reviewed the patients History and Physical, chart, labs and discussed the procedure including the risks, benefits and alternatives for the proposed anesthesia with the patient or authorized representative who has indicated his/her understanding and acceptance.   Dental advisory given  Plan Discussed with: CRNA  Anesthesia Plan Comments:         Anesthesia Quick Evaluation

## 2012-07-08 NOTE — Transfer of Care (Signed)
Immediate Anesthesia Transfer of Care Note  Patient: [name]  Procedure(s) Performed: Procedure(s) (LRB): MINOR INCISION AND DRAINAGE OF ABSCESS (N/A)  Patient Location: PACU  Anesthesia Type: General  Level of Consciousness: sleeping and comfortable  Airway & Oxygen Therapy: Patient Spontanous Breathing and Patient connected to face mask oxygen  Post-op Assessment: Report given to PACU RN and Post -op Vital signs reviewed and stable  Post vital signs: Reviewed and stable  Complications: No apparent anesthesia complications

## 2012-07-08 NOTE — Anesthesia Postprocedure Evaluation (Signed)
Anesthesia Post Note  Patient: [name]  Procedure(s) Performed: Procedure(s) (LRB): MINOR INCISION AND DRAINAGE OF ABSCESS (N/A)  Anesthesia type: General  Patient location: PACU  Post pain: Pain level controlled  Post assessment: Post-op Vital signs reviewed  Last Vitals:  Filed Vitals:   07/08/12 1239  BP: 113/75  Pulse: 93  Temp: 36.2 C  Resp: 12    Post vital signs: Reviewed  Level of consciousness: sedated  Complications: No apparent anesthesia complications

## 2012-07-08 NOTE — Brief Op Note (Signed)
07/08/2012  12:28 PM  PATIENT:  [name]  46 y.o. female  PRE-OPERATIVE DIAGNOSIS:  Vulvar Abcess  POST-OPERATIVE DIAGNOSIS:  Vulvar/ perirectal Abscess  PROCEDURE:  Procedure(s) (LRB) with comments: MINOR INCISION AND DRAINAGE OF ABSCESS (N/A) - Vulvar Abscess  SURGEON:  Surgeon(s) and Role:    * Geryl Rankins, MD - Primary  PHYSICIAN ASSISTANT: None  ASSISTANTS: Technician   ANESTHESIA:   general  EBL:  Total I/O In: 200 [I.V.:200] Out: -   BLOOD ADMINISTERED:none  DRAINS: none   LOCAL MEDICATIONS USED:  MARCAINE  0.25%   SPECIMEN:  Source of Specimen:  vulvar lesions and abscess wall, aerobic and anaerobic cultures obtained  DISPOSITION OF SPECIMEN:  PATHOLOGY  COUNTS:  YES  TOURNIQUET:  * No tourniquets in log *  DICTATION: .Other Dictation: Dictation Number [id number]  PLAN OF CARE: Discharge to home after PACU  PATIENT DISPOSITION:  PACU - hemodynamically stable.   Delay start of Pharmacological VTE agent (>24hrs) due to surgical blood loss or risk of bleeding: not applicable

## 2012-07-08 NOTE — Anesthesia Procedure Notes (Signed)
Procedure Name: LMA Insertion Date/Time: 07/08/2012 11:48 AM Performed by: Norva Pavlov Pre-anesthesia Checklist: Patient identified, Emergency Drugs available, Suction available and Patient being monitored Patient Re-evaluated:Patient Re-evaluated prior to inductionOxygen Delivery Method: Circle System Utilized Preoxygenation: Pre-oxygenation with 100% oxygen Intubation Type: IV induction Ventilation: Mask ventilation without difficulty LMA: LMA inserted LMA Size: 4.0 Number of attempts: 1 Airway Equipment and Method: bite block Placement Confirmation: positive ETCO2 Tube secured with: Tape Dental Injury: Teeth and Oropharynx as per pre-operative assessment

## 2012-07-09 ENCOUNTER — Encounter (HOSPITAL_BASED_OUTPATIENT_CLINIC_OR_DEPARTMENT_OTHER): Payer: Self-pay | Admitting: Obstetrics and Gynecology

## 2012-07-09 NOTE — Op Note (Signed)
[name], [name] NO.:  [id number]  MEDICAL RECORD NO.:  [id number]  LOCATION:                                FACILITY:  [facility]  PHYSICIAN:  Pieter Partridge, MD   DATE OF BIRTH:  20-Feb-1966  DATE OF PROCEDURE:  07/08/2012 DATE OF DISCHARGE:                              OPERATIVE REPORT   PREOPERATIVE DIAGNOSIS:  Left vulvar abscess.  POSTOPERATIVE DIAGNOSIS:  Left vulvar/perirectal abscess.  PROCEDURE:  Incision and drainage of abscess with excision of vulvar lesions.  SURGEON:  Pieter Partridge, MD  ASSISTANT:  Technician.  ANESTHESIA:  General.  IV FLUIDS:  200.  URINE OUTPUT:  No urine drained during the procedure.  EBL:  Minimal.  BLOOD ADMINISTERED:  None.  DRAINS:  None.  Local Marcaine 0.25%.  SOURCE OF SPECIMEN:  Vulvar lesion and abscess.  CULTURES OBTAINED:  Aerobic and anaerobic cultures obtained.  DISPOSITION:  Specimens to pathology.  DISPOSITION TO PACU:  Hemodynamically stable.  FINDINGS:  Several areas of induration extending from the left of the peritoneum, right below the labia majora and extending down to the rectum, and a previous vulvar abscess that was active on the 12 o'clock position of this area was no longer obvious.  The bottom lesion had active draining or pus draining with compression of the perirectal region.  Abscess cavity was significantly indurated.  No obvious fistula or tracking identified.  PROCEDURE IN DETAIL:  [name] was identified in the holding area.  She was then taken to the operating room with IV running.  She was placed in the dorsal supine position and underwent LMA anesthesia without complication.  She was then placed in the dorsal supine position and prepped and draped in normal sterile fashion.  Prior to prepping, I palpated the area that was previously fluctuant, which is no longer was.  Once the patient was prepped, I palpated the entire area and marked the demarcations the borders  of the abscess.  The 2 vulvar lesions that have actively been draining for vertical to each other like 12 and 6 o'clock.  I initially made an elliptical incision to encompass those 2 areas.  I then opened the subcutaneous space below, it was very, very indurated, but there was a softer fluctuant area that I felt was a tract that led to those 2 lesions that was then opened and extended into the vulvar perirectal area approximately 2-3 cm.  The cyst wall was identified, but there was no collection of purulent discharge. However, cultures were obtained.  I debrided the area, removed most of the portion of the induration and abscess wall and that was sent.  I tried to identify if there was a tract or some type of fistula with hemostat and there was no obvious connection.  Once the exploration and debridement was complete, hemostasis was achieved as it had been during the surgery with the Bovie.  I placed some Monsel at the bed of the wound.  It was then packed with iodoform gauze.  A 4 x 4 gauze would be placed in the areas.  The bleeding was hemostatic, however, as I scrubbed there was  some pulling, I felt like it was in the crease of her buttock and as soon as her leg was down that would provide some tamponade.  It responded very well to tamponade prior to completing the procedure and packing it.  All instruments and sponge counts were correct x3.  The patient did very well during the procedure.  She received Cipro prior to incision and clindamycin as the procedure continued.  SCDs were in place throughout the procedure.  The patient tolerated the procedure well.     Pieter Partridge, MD     EBV/MEDQ  D:  07/08/2012  T:  07/09/2012  Job:  [id number]

## 2012-07-12 LAB — CULTURE, ROUTINE-ABSCESS: Culture: NO GROWTH

## 2012-07-13 LAB — ANAEROBIC CULTURE

## 2012-08-13 ENCOUNTER — Telehealth (INDEPENDENT_AMBULATORY_CARE_PROVIDER_SITE_OTHER): Payer: Self-pay | Admitting: General Surgery

## 2012-08-13 NOTE — Telephone Encounter (Signed)
LMOM letting pt know that we have set up an appt for her to see Dr. Biagio Quint on 10/11 at 10:00.

## 2012-08-14 ENCOUNTER — Telehealth (INDEPENDENT_AMBULATORY_CARE_PROVIDER_SITE_OTHER): Payer: Self-pay

## 2012-08-14 NOTE — Telephone Encounter (Signed)
Left message for patient regarding appointment time change from 10:00 to 2:00pm w/Dr. Biagio Quint 08/15/12.

## 2012-08-15 ENCOUNTER — Encounter (INDEPENDENT_AMBULATORY_CARE_PROVIDER_SITE_OTHER): Payer: Self-pay | Admitting: General Surgery

## 2012-08-15 ENCOUNTER — Encounter (INDEPENDENT_AMBULATORY_CARE_PROVIDER_SITE_OTHER): Payer: Self-pay

## 2012-08-15 ENCOUNTER — Telehealth (INDEPENDENT_AMBULATORY_CARE_PROVIDER_SITE_OTHER): Payer: Self-pay

## 2012-08-15 ENCOUNTER — Ambulatory Visit (INDEPENDENT_AMBULATORY_CARE_PROVIDER_SITE_OTHER): Payer: BC Managed Care – PPO | Admitting: General Surgery

## 2012-08-15 VITALS — BP 142/80 | HR 88 | Temp 97.2°F | Resp 16 | Ht 64.0 in | Wt 157.2 lb

## 2012-08-15 DIAGNOSIS — K603 Anal fistula, unspecified: Secondary | ICD-10-CM

## 2012-08-15 NOTE — Progress Notes (Signed)
Patient ID: [name], female   DOB: 07/04/1966, 45 y.o.   MRN: [id number]  Chief Complaint  Patient presents with  . Pre-op Exam    eval vulvar/perirectal abscess    HPI [name] is a 45 y.o. female.   HPI this patient is known to our practice for a prior hemorrhoid procedure by Dr. Allen. She recently had development of a perirectal abscess and her gynecologist performed exam under anesthesia and incision and drainage of this area which required wound packing. She said that this wound has never completely healed and she has daily drainage and discomfort from a small area in the skin to the left side of her anus. She does have a fever chills or active redness but has been on antibiotics and has not demonstrated complete healing. She says that it does cause some discomfort and occasionally some feculent drainage but mostly has greenish mucoid drainage  Past Medical History  Diagnosis Date  . Vulvar abscess   . Hyperlipidemia     Past Surgical History  Procedure Date  . Irrigation and debridement abscess 07/08/2012    Procedure: MINOR INCISION AND DRAINAGE OF ABSCESS;  Surgeon: Evelyn Varnado, MD;  Location: [facility];  Service: Gynecology;  Laterality: N/A;  Vulvar Abscess    History reviewed. No pertinent family history.  Social History History  Substance Use Topics  . Smoking status: Never Smoker   . Smokeless tobacco: Never Used  . Alcohol Use: No    Allergies  Allergen Reactions  . Ampicillin Anaphylaxis  . Penicillins Anaphylaxis  . Sulfonamide Derivatives Anaphylaxis    Current Outpatient Prescriptions  Medication Sig Dispense Refill  . oxyCODONE-acetaminophen (ROXICET) 5-325 MG per tablet 1-2 tablets by mouth every 4-6 hours prn pain.  30 tablet  0  . PRESCRIPTION MEDICATION Take 1 tablet by mouth daily.      . simvastatin (ZOCOR) 10 MG tablet Take 10 mg by mouth at bedtime.        Review of Systems Review of Systems All other review of  systems negative or noncontributory except as stated in the HPI  Blood pressure 142/80, pulse 88, temperature 97.2 F (36.2 C), temperature source Temporal, resp. rate 16, height 5' 4" (1.626 m), weight 157 lb 3.2 oz (71.305 kg).  Physical Exam Physical Exam Physical Exam  Nursing note and vitals reviewed. Constitutional: She is oriented to person, place, and time. She appears well-developed and well-nourished. No distress.  HENT:  Head: Normocephalic and atraumatic.  Mouth/Throat: No oropharyngeal exudate.  Eyes: Conjunctivae and EOM are normal. Pupils are equal, round, and reactive to light. Right eye exhibits no discharge. Left eye exhibits no discharge. No scleral icterus.  Neck: Normal range of motion. Neck supple. No tracheal deviation present.  Cardiovascular: Normal rate, regular rhythm, normal heart sounds and intact distal pulses.   Pulmonary/Chest: Effort normal and breath sounds normal. No stridor. No respiratory distress. She has no wheezes.  Abdominal: Soft. Bowel sounds are normal. She exhibits no distension and no mass. There is no tenderness. There is no rebound and no guarding.  Musculoskeletal: Normal range of motion. She exhibits no edema and no tenderness.  Neurological: She is alert and oriented to person, place, and time.  Skin: Skin is warm and dry. No rash noted. She is not diaphoretic. No erythema. No pallor.  Psychiatric: She has a normal mood and affect. Her behavior is normal. Judgment and thought content normal.  Rectal:  Rectal exam demonstrates no internal lesions   although there was possibly a small amount of purulence expressed from the rectum. She has a 5 mm area of with a pinpoint opening to the left of the rectum at the 2:00 position which has some mucus drainage which is likely a perianal fistula I do not appreciate any obvious abscess or erythema she does have some chronic induration around the area.  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 It is difficult to tell in the clinic because her exam is limited by discomfort. I have recommended rectal exam under anesthesia for further evaluation with possible seton placement or possible anal fistulotomy. We discussed the pros and cons of this including the risks of the procedure including the possibility of injuring the sphincters and needing repeat procedure and the possibility of persistent drainage and the need for wound care. We also discussed the option of continued observation as this may heal on its own but it has been 2 months and continues to drain. She would like to proceed with rectal exam under anesthesia and possible fistulotomy and seton placement   Plan    We will set her up for rectal exam under anesthesia with possible fistulotomy possible seton placement       Lamaria Hildebrandt DAVID 08/15/2012, 3:19 PM    

## 2012-08-15 NOTE — Telephone Encounter (Signed)
Second message left for patient regarding appointment time change from 10:00 to 2:00 on 08/15/12 w/Dr. Biagio Quint.

## 2012-08-20 ENCOUNTER — Other Ambulatory Visit (INDEPENDENT_AMBULATORY_CARE_PROVIDER_SITE_OTHER): Payer: Self-pay | Admitting: General Surgery

## 2012-08-20 DIAGNOSIS — K603 Anal fistula, unspecified: Secondary | ICD-10-CM

## 2012-08-21 ENCOUNTER — Encounter (HOSPITAL_COMMUNITY): Payer: Self-pay

## 2012-08-21 ENCOUNTER — Encounter (HOSPITAL_COMMUNITY)
Admission: RE | Admit: 2012-08-21 | Discharge: 2012-08-21 | Disposition: A | Discharge status: Home / Self Care | Payer: BC Managed Care – PPO | Source: Ambulatory Visit | Attending: General Surgery | Admitting: General Surgery

## 2012-08-21 VITALS — BP 140/91 | HR 81 | Temp 98.9°F | Resp 20 | Ht 64.0 in | Wt 156.3 lb

## 2012-08-21 DIAGNOSIS — K603 Anal fistula, unspecified: Secondary | ICD-10-CM

## 2012-08-21 LAB — BASIC METABOLIC PANEL
BUN: 8 mg/dL (ref 6–23)
CO2: 24 mEq/L (ref 19–32)
Calcium: 9.3 mg/dL (ref 8.4–10.5)
Creatinine, Ser: 0.78 mg/dL (ref 0.50–1.10)
GFR calc Af Amer: >90 mL/min (ref >90)
GFR calc non Af Amer: >90 mL/min (ref >90)
Potassium: 4.1 mEq/L (ref 3.5–5.1)
Sodium: 139 mEq/L (ref 135–145)

## 2012-08-21 LAB — CBC
MCHC: 33.4 g/dL (ref 30.0–36.0)
Platelets: 288 10*3/uL (ref 150–400)
RDW: 13 % (ref 11.5–15.5)
WBC: 6.1 10*3/uL (ref 4.0–10.5)

## 2012-08-21 LAB — HCG, SERUM, QUALITATIVE: Preg, Serum: NEGATIVE

## 2012-08-21 MED ORDER — CIPROFLOXACIN IN D5W 400 MG/200ML IV SOLN
400.0000 mg | INTRAVENOUS | Status: AC
  Administered 2012-08-22: 400 mg via INTRAVENOUS
  Filled 2012-08-21: qty 200

## 2012-08-21 NOTE — Pre-Procedure Instructions (Signed)
18 [name]  08/21/2012   Your procedure is scheduled on:  Friday August 22, 2012 at 1230 PM  Report to [facility] at 1030AM.  Call this number if you have problems the morning of surgery: [phone]   Remember:   Do not eat food or drink:After Midnight.Thursday      Take these medicines the morning of surgery with A SIP OF WATER: Antivert and Percocet if needed.   Do not wear jewelry, make-up or nail polish.  Do not wear lotions, powders, or perfumes. You may wear deodorant.  Do not shave 48 hours prior to surgery.   Do not bring valuables to the hospital.  Contacts, dentures or bridgework may not be worn into surgery.  Leave suitcase in the car. After surgery it may be brought to your room.  For patients admitted to the hospital, checkout time is 11:00 AM the day of discharge.   Patients discharged the day of surgery will not be allowed to drive home.    Special Instructions: Shower using CHG 2 nights before surgery and the night before surgery.  If you shower the day of surgery use CHG.  Use special wash - you have one bottle of CHG for all showers.  You should use approximately 1/3 of the bottle for each shower.   Please read over the following fact sheets that you were given: Pain Booklet, Coughing and Deep Breathing, MRSA Information and Surgical Site Infection Prevention

## 2012-08-21 NOTE — Progress Notes (Signed)
Dr Biagio Quint 's  Office notified  that pt  was unaware that an enema was needed AM before surgery. Germaine at office stated to instruct pt to do a Fleets enema before coming to hospital. Pt 's mother notified.

## 2012-08-22 ENCOUNTER — Encounter (HOSPITAL_COMMUNITY): Payer: Self-pay | Admitting: *Deleted

## 2012-08-22 ENCOUNTER — Ambulatory Visit (HOSPITAL_COMMUNITY)
Admission: RE | Admit: 2012-08-22 | Discharge: 2012-08-22 | Disposition: A | Discharge status: Home / Self Care | Payer: BC Managed Care – PPO | Source: Ambulatory Visit | Attending: General Surgery | Admitting: General Surgery

## 2012-08-22 ENCOUNTER — Encounter (HOSPITAL_COMMUNITY): Payer: Self-pay | Admitting: Anesthesiology

## 2012-08-22 ENCOUNTER — Encounter (HOSPITAL_COMMUNITY)
Admission: RE | Disposition: A | Discharge status: Home / Self Care | Payer: Self-pay | Source: Ambulatory Visit | Attending: General Surgery

## 2012-08-22 ENCOUNTER — Ambulatory Visit (HOSPITAL_COMMUNITY): Payer: BC Managed Care – PPO | Admitting: Anesthesiology

## 2012-08-22 DIAGNOSIS — K603 Anal fistula, unspecified: Secondary | ICD-10-CM | POA: Insufficient documentation

## 2012-08-22 DIAGNOSIS — Z01812 Encounter for preprocedural laboratory examination: Secondary | ICD-10-CM | POA: Insufficient documentation

## 2012-08-22 DIAGNOSIS — K648 Other hemorrhoids: Secondary | ICD-10-CM | POA: Insufficient documentation

## 2012-08-22 DIAGNOSIS — E785 Hyperlipidemia, unspecified: Secondary | ICD-10-CM | POA: Insufficient documentation

## 2012-08-22 HISTORY — PX: ANAL FISTULECTOMY: SHX1139

## 2012-08-22 SURGERY — FISTULECTOMY, ANAL
Anesthesia: General | Site: Rectum | Wound class: Dirty or Infected

## 2012-08-22 MED ORDER — LACTATED RINGERS IV SOLN
INTRAVENOUS | Status: DC
  Administered 2012-08-22: 12:00:00 via INTRAVENOUS

## 2012-08-22 MED ORDER — ROCURONIUM BROMIDE 100 MG/10ML IV SOLN
INTRAVENOUS | Status: DC | PRN
  Administered 2012-08-22: 40 mg via INTRAVENOUS

## 2012-08-22 MED ORDER — ONDANSETRON HCL 4 MG/2ML IJ SOLN
INTRAMUSCULAR | Status: DC | PRN
  Administered 2012-08-22: 4 mg via INTRAVENOUS

## 2012-08-22 MED ORDER — FLEET ENEMA 7-19 GM/118ML RE ENEM
1.0000 | ENEMA | Freq: Once | RECTAL | Status: AC
  Administered 2012-08-22: 1 via RECTAL
  Filled 2012-08-22: qty 1

## 2012-08-22 MED ORDER — BUPIVACAINE-EPINEPHRINE PF 0.25-1:200000 % IJ SOLN
INTRAMUSCULAR | Status: AC
  Filled 2012-08-22: qty 30

## 2012-08-22 MED ORDER — HYDROCODONE-ACETAMINOPHEN 5-325 MG PO TABS: 1.0000 | ORAL_TABLET | Freq: Four times a day (QID) | ORAL | Status: DC | PRN

## 2012-08-22 MED ORDER — PROMETHAZINE HCL 25 MG/ML IJ SOLN: 6.2500 mg | INTRAMUSCULAR | Status: DC | PRN

## 2012-08-22 MED ORDER — MIDAZOLAM HCL 5 MG/5ML IJ SOLN
INTRAMUSCULAR | Status: DC | PRN
  Administered 2012-08-22: 2 mg via INTRAVENOUS

## 2012-08-22 MED ORDER — DIBUCAINE 1 % RE OINT
TOPICAL_OINTMENT | RECTAL | Status: DC | PRN
  Administered 2012-08-22: 1 via RECTAL

## 2012-08-22 MED ORDER — LACTATED RINGERS IV SOLN
INTRAVENOUS | Status: DC | PRN
  Administered 2012-08-22: 14:00:00 via INTRAVENOUS

## 2012-08-22 MED ORDER — ARTIFICIAL TEARS OP OINT
TOPICAL_OINTMENT | OPHTHALMIC | Status: DC | PRN
  Administered 2012-08-22: 1 via OPHTHALMIC

## 2012-08-22 MED ORDER — BUPIVACAINE LIPOSOME 1.3 % IJ SUSP
20.0000 mL | INTRAMUSCULAR | Status: DC
  Filled 2012-08-22: qty 20

## 2012-08-22 MED ORDER — MIDAZOLAM HCL 2 MG/2ML IJ SOLN: 1.0000 mg | INTRAMUSCULAR | Status: DC | PRN

## 2012-08-22 MED ORDER — HYDROMORPHONE HCL PF 1 MG/ML IJ SOLN: 0.2500 mg | INTRAMUSCULAR | Status: DC | PRN

## 2012-08-22 MED ORDER — DEXTROSE 5 % IV SOLN
INTRAVENOUS | Status: DC | PRN
  Administered 2012-08-22: 15:00:00 via INTRAVENOUS

## 2012-08-22 MED ORDER — FENTANYL CITRATE 0.05 MG/ML IJ SOLN: 50.0000 ug | Freq: Once | INTRAMUSCULAR | Status: DC

## 2012-08-22 MED ORDER — LIDOCAINE-EPINEPHRINE (PF) 1 %-1:200000 IJ SOLN
INTRAMUSCULAR | Status: DC | PRN
  Administered 2012-08-22: 14:00:00 via INTRAMUSCULAR

## 2012-08-22 MED ORDER — OXYCODONE HCL 5 MG PO TABS: 5.0000 mg | ORAL_TABLET | Freq: Once | ORAL | Status: DC | PRN

## 2012-08-22 MED ORDER — FENTANYL CITRATE 0.05 MG/ML IJ SOLN
INTRAMUSCULAR | Status: DC | PRN
  Administered 2012-08-22 (×3): 50 ug via INTRAVENOUS

## 2012-08-22 MED ORDER — NEOSTIGMINE METHYLSULFATE 1 MG/ML IJ SOLN
INTRAMUSCULAR | Status: DC | PRN
  Administered 2012-08-22: 4 mg via INTRAVENOUS

## 2012-08-22 MED ORDER — PROPOFOL 10 MG/ML IV BOLUS
INTRAVENOUS | Status: DC | PRN
  Administered 2012-08-22: 180 mg via INTRAVENOUS

## 2012-08-22 MED ORDER — ACETAMINOPHEN 10 MG/ML IV SOLN
1000.0000 mg | Freq: Once | INTRAVENOUS | Status: DC
  Filled 2012-08-22: qty 100

## 2012-08-22 MED ORDER — LIDOCAINE HCL (CARDIAC) 20 MG/ML IV SOLN
INTRAVENOUS | Status: DC | PRN
  Administered 2012-08-22: 80 mg via INTRAVENOUS

## 2012-08-22 MED ORDER — GLYCOPYRROLATE 0.2 MG/ML IJ SOLN
INTRAMUSCULAR | Status: DC | PRN
  Administered 2012-08-22: 0.6 mg via INTRAVENOUS

## 2012-08-22 MED ORDER — OXYCODONE HCL 5 MG/5ML PO SOLN: 5.0000 mg | Freq: Once | ORAL | Status: DC | PRN

## 2012-08-22 SURGICAL SUPPLY — 46 items
APL SKNCLS STERI-STRIP NONHPOA (GAUZE/BANDAGES/DRESSINGS) ×1
BENZOIN TINCTURE PRP APPL 2/3 (GAUZE/BANDAGES/DRESSINGS) ×2 IMPLANT
BLADE SURG 11 STRL SS (BLADE) IMPLANT
BLADE SURG 15 STRL LF DISP TIS (BLADE) ×1 IMPLANT
BLADE SURG 15 STRL SS (BLADE) ×2
BRIEF STRETCH FOR OB PAD LRG (UNDERPADS AND DIAPERS) ×2 IMPLANT
CANISTER SUCTION 1200CC (MISCELLANEOUS) ×2 IMPLANT
CLEANER TIP ELECTROSURG 2X2 (MISCELLANEOUS) ×1 IMPLANT
COVER MAYO STAND STRL (DRAPES) ×2 IMPLANT
COVER TABLE BACK 60X90 (DRAPES) ×2 IMPLANT
DECANTER SPIKE VIAL GLASS SM (MISCELLANEOUS) IMPLANT
DRAPE PED LAPAROTOMY (DRAPES) ×2 IMPLANT
DRAPE UTILITY XL STRL (DRAPES) ×2 IMPLANT
DRSG PAD ABDOMINAL 8X10 ST (GAUZE/BANDAGES/DRESSINGS) ×1 IMPLANT
ELECT COATED BLADE 2.86 ST (ELECTRODE) ×2 IMPLANT
ELECT REM PT RETURN 9FT ADLT (ELECTROSURGICAL) ×2
ELECTRODE REM PT RTRN 9FT ADLT (ELECTROSURGICAL) ×1 IMPLANT
GAUZE SPONGE 4X4 12PLY STRL LF (GAUZE/BANDAGES/DRESSINGS) ×2 IMPLANT
GAUZE VASELINE 1X8 (GAUZE/BANDAGES/DRESSINGS) ×2 IMPLANT
GLOVE SURG SS PI 7.5 STRL IVOR (GLOVE) ×4 IMPLANT
GLOVE SURG SS PI 8.5 STRL IVOR (GLOVE) ×1
GLOVE SURG SS PI 8.5 STRL STRW (GLOVE) ×1 IMPLANT
GOWN PREVENTION PLUS XLARGE (GOWN DISPOSABLE) ×2 IMPLANT
GOWN PREVENTION PLUS XXLARGE (GOWN DISPOSABLE) ×2 IMPLANT
LOOP VESSEL MAXI BLUE (MISCELLANEOUS) ×1 IMPLANT
NDL HYPO 25X1 1.5 SAFETY (NEEDLE) ×1 IMPLANT
NEEDLE HYPO 25X1 1.5 SAFETY (NEEDLE) ×2 IMPLANT
PACK BASIN DAY SURGERY FS (CUSTOM PROCEDURE TRAY) ×2 IMPLANT
PENCIL BUTTON HOLSTER BLD 10FT (ELECTRODE) ×2 IMPLANT
SHEARS HARMONIC 9CM CVD (BLADE) ×2 IMPLANT
SLEEVE SCD COMPRESS KNEE MED (MISCELLANEOUS) ×1 IMPLANT
SPONGE GAUZE 4X4 12PLY (GAUZE/BANDAGES/DRESSINGS) ×2 IMPLANT
SPONGE SURGIFOAM ABS GEL 100 (HEMOSTASIS) IMPLANT
SURGILUBE 2OZ TUBE FLIPTOP (MISCELLANEOUS) ×2 IMPLANT
SUT CHROMIC 3 0 SH 27 (SUTURE) ×2 IMPLANT
SYR CONTROL 10ML LL (SYRINGE) ×2 IMPLANT
TAPE CLOTH 3X10 TAN LF (GAUZE/BANDAGES/DRESSINGS) ×2 IMPLANT
TAPE CLOTH SURG 4X10 WHT LF (GAUZE/BANDAGES/DRESSINGS) ×1 IMPLANT
TAPE UMBILICAL 1/8 X36 TWILL (MISCELLANEOUS) ×1 IMPLANT
TOWEL OR 17X24 6PK STRL BLUE (TOWEL DISPOSABLE) ×4 IMPLANT
TOWEL OR NON WOVEN STRL DISP B (DISPOSABLE) ×2 IMPLANT
TRAY DSU PREP LF (CUSTOM PROCEDURE TRAY) ×2 IMPLANT
TUBE CONNECTING 20X1/4 (TUBING) ×2 IMPLANT
UNDERPAD 30X30 INCONTINENT (UNDERPADS AND DIAPERS) IMPLANT
WATER STERILE IRR 1000ML POUR (IV SOLUTION) ×2 IMPLANT
YANKAUER SUCT BULB TIP NO VENT (SUCTIONS) ×2 IMPLANT

## 2012-08-22 NOTE — Anesthesia Postprocedure Evaluation (Signed)
Anesthesia Post Note  Patient: [name]  Procedure(s) Performed: Procedure(s) (LRB): FISTULECTOMY ANAL (N/A)  Anesthesia type: general  Patient location: PACU  Post pain: Pain level controlled  Post assessment: Patient's Cardiovascular Status Stable  Last Vitals:  Filed Vitals:   08/22/12 1042  BP: 143/97  Pulse: 82  Temp: 36.9 C  Resp: 18    Post vital signs: Reviewed and stable  Level of consciousness: sedated  Complications: No apparent anesthesia complications

## 2012-08-22 NOTE — Anesthesia Procedure Notes (Signed)
Procedure Name: Intubation Date/Time: 08/22/2012 1:47 PM Performed by: Darcey Nora B Pre-anesthesia Checklist: Patient identified, Emergency Drugs available and Suction available Patient Re-evaluated:Patient Re-evaluated prior to inductionOxygen Delivery Method: Circle system utilized Preoxygenation: Pre-oxygenation with 100% oxygen Intubation Type: IV induction Ventilation: Mask ventilation without difficulty Laryngoscope Size: Mac and 3 Grade View: Grade II Tube type: Oral Tube size: 7.5 mm Number of attempts: 1 Airway Equipment and Method: Stylet Secured at: 21 (cm at teeth) cm Tube secured with: Tape Dental Injury: Teeth and Oropharynx as per pre-operative assessment

## 2012-08-22 NOTE — Op Note (Signed)
[name], [name] NO.:  [id number]  MEDICAL RECORD NO.:  [id number]  LOCATION:  [facility]                         FACILITY:  [facility]  PHYSICIAN:  Lodema Pilot, MD       DATE OF BIRTH:  [birth year]  DATE OF PROCEDURE: DATE OF DISCHARGE:  08/22/2012                              OPERATIVE REPORT   PROCEDURE:  Rectal exam under anesthesia with Seton placement.  SURGEON:  Lodema Pilot, MD  ASSISTANT:  None.  ANESTHESIA:  General endotracheal tube anesthesia with 30 mL of 0.25% Marcaine with epinephrine.  FLUIDS:  600 mL of crystalloid.  ESTIMATED BLOOD LOSS:  Minimal.  DRAINS:  Noncutting Seton placement.  SPECIMENS:  None.  COMPLICATIONS:  None apparent.  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  INDICATION FOR PROCEDURE:  [name] is a 46 year old female with prior incision and drainage of the perirectal abscess back in September. Since then, even though her wound has healed, she has persistent drainage from the area of the incision and drainage and concern for perirectal abscess.  OPERATIVE DETAILS:  [name] was seen and evaluated in the preoperative area, and risks and benefits of the procedure were discussed in lay terms.  Informed consent was obtained.  She was given prophylactic antibiotics and taken to the operating room, placed on the table in supine position and general endotracheal tube anesthesia was obtained, and she was flipped in the prone jack-knife position, and her buttocks were taped laterally, and her buttock region was prepped and draped in a standard surgical fashion.  Procedure time-out was performed with all operative team members to confirm proper patient, procedure, and I then placed the anal speculum into the rectum, inspected the area for any internal connection.  I immediately noticed at the 7 o'clock  position while she was in the prone jack-knife position an internal opening with purulent drainage.  I placed the fistula probe through the external opening and tract in a radial fashion towards the internal opening, and the tip of the probe was easily visualized coming out from the internal opening.  There was some firm tissue superficial to the probe concerning for sphincter muscle, although this could not be induration from her chronic infections, and felt that the safest thing to do for this probable intersphincteric abscess would be to curettage the tract and placed the [facility] with plans for later fistula plug placement.  I pulled the umbilical tape from an opening through to the other, and the curettage the tract with the umbilical tape and then placed the vessel loop through the wound and tied to itself for placement of a noncutting Seton.  Because of the probable sphincter involvement, I did not feel it was safe to perform fistulotomy, and I thought that this would be the best option to drain the chronic infection and any residual abscess and come back in 2-6 weeks for placement of the fistula plug and hopeful definitive treatment.  The Burnadette Pop was tied to itself and inspected the remainder of the perirectal area.  There  was no other internal opening was identified.  The only other abnormality was internal and external hemorrhoid at the left lateral column.  I injected the perirectal area with 30 mL of 0.25% Marcaine with epinephrine, and a dressing was applied.  All sponge, needle, and instrument counts were correct at the end of the case.  The patient tolerated the procedure well without apparent complication.          ______________________________ Lodema Pilot, MD     BL/MEDQ  D:  08/22/2012  T:  08/22/2012  Job:  [id number]

## 2012-08-22 NOTE — H&P (View-Only) (Signed)
Patient ID: [name], female   DOB: 07-25-1966, 46 y.o.   MRN: [id number]  Chief Complaint  Patient presents with  . Pre-op Exam    eval vulvar/perirectal abscess    HPI [name] is a 46 y.o. female.   HPI this patient is known to our practice for a prior hemorrhoid procedure by Dr. Freida Busman. She recently had development of a perirectal abscess and her gynecologist performed exam under anesthesia and incision and drainage of this area which required wound packing. She said that this wound has never completely healed and she has daily drainage and discomfort from a small area in the skin to the left side of her anus. She does have a fever chills or active redness but has been on antibiotics and has not demonstrated complete healing. She says that it does cause some discomfort and occasionally some feculent drainage but mostly has greenish mucoid drainage  Past Medical History  Diagnosis Date  . Vulvar abscess   . Hyperlipidemia     Past Surgical History  Procedure Date  . Irrigation and debridement abscess 07/08/2012    Procedure: MINOR INCISION AND DRAINAGE OF ABSCESS;  Surgeon: Geryl Rankins, MD;  Location: [facility];  Service: Gynecology;  Laterality: N/A;  Vulvar Abscess    History reviewed. No pertinent family history.  Social History History  Substance Use Topics  . Smoking status: Never Smoker   . Smokeless tobacco: Never Used  . Alcohol Use: No    Allergies  Allergen Reactions  . Ampicillin Anaphylaxis  . Penicillins Anaphylaxis  . Sulfonamide Derivatives Anaphylaxis    Current Outpatient Prescriptions  Medication Sig Dispense Refill  . oxyCODONE-acetaminophen (ROXICET) 5-325 MG per tablet 1-2 tablets by mouth every 4-6 hours prn pain.  30 tablet  0  . PRESCRIPTION MEDICATION Take 1 tablet by mouth daily.      . simvastatin (ZOCOR) 10 MG tablet Take 10 mg by mouth at bedtime.        Review of Systems Review of Systems All other review of  systems negative or noncontributory except as stated in the HPI  Blood pressure 142/80, pulse 88, temperature 97.2 F (36.2 C), temperature source Temporal, resp. rate 16, height 5\' 4"  (1.626 m), weight 157 lb 3.2 oz (71.305 kg).  Physical Exam Physical Exam Physical Exam  Nursing note and vitals reviewed. Constitutional: She is oriented to person, place, and time. She appears well-developed and well-nourished. No distress.  HENT:  Head: Normocephalic and atraumatic.  Mouth/Throat: No oropharyngeal exudate.  Eyes: Conjunctivae and EOM are normal. Pupils are equal, round, and reactive to light. Right eye exhibits no discharge. Left eye exhibits no discharge. No scleral icterus.  Neck: Normal range of motion. Neck supple. No tracheal deviation present.  Cardiovascular: Normal rate, regular rhythm, normal heart sounds and intact distal pulses.   Pulmonary/Chest: Effort normal and breath sounds normal. No stridor. No respiratory distress. She has no wheezes.  Abdominal: Soft. Bowel sounds are normal. She exhibits no distension and no mass. There is no tenderness. There is no rebound and no guarding.  Musculoskeletal: Normal range of motion. She exhibits no edema and no tenderness.  Neurological: She is alert and oriented to person, place, and time.  Skin: Skin is warm and dry. No rash noted. She is not diaphoretic. No erythema. No pallor.  Psychiatric: She has a normal mood and affect. Her behavior is normal. Judgment and thought content normal.  Rectal:  Rectal exam demonstrates no internal lesions  although there was possibly a small amount of purulence expressed from the rectum. She has a 5 mm area of with a pinpoint opening to the left of the rectum at the 2:00 position which has some mucus drainage which is likely a perianal fistula I do not appreciate any obvious abscess or erythema she does have some chronic induration around the area.  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 It is difficult to tell in the clinic because her exam is limited by discomfort. I have recommended rectal exam under anesthesia for further evaluation with possible seton placement or possible anal fistulotomy. We discussed the pros and cons of this including the risks of the procedure including the possibility of injuring the sphincters and needing repeat procedure and the possibility of persistent drainage and the need for wound care. We also discussed the option of continued observation as this may heal on its own but it has been 2 months and continues to drain. She would like to proceed with rectal exam under anesthesia and possible fistulotomy and seton placement   Plan    We will set her up for rectal exam under anesthesia with possible fistulotomy possible seton placement       [name] DAVID 08/15/2012, 3:19 PM

## 2012-08-22 NOTE — Transfer of Care (Signed)
Immediate Anesthesia Transfer of Care Note  Patient: [name]  Procedure(s) Performed: Procedure(s) (LRB) with comments: FISTULECTOMY ANAL (N/A) - rectal examination under anesthesia with seton placement   Patient Location: PACU  Anesthesia Type: General  Level of Consciousness: awake, sedated and patient cooperative  Airway & Oxygen Therapy: Patient Spontanous Breathing and Patient connected to face mask oxygen  Post-op Assessment: Report given to PACU RN and Post -op Vital signs reviewed and stable  Post vital signs: Reviewed and stable  Complications: No apparent anesthesia complications

## 2012-08-22 NOTE — Interval H&P Note (Signed)
History and Physical Interval Note:  08/22/2012 12:54 PM  [name]  has presented today for surgery, with the diagnosis of Anal fistula  The various methods of treatment have been discussed with the patient and family. After consideration of risks, benefits and other options for treatment, the patient has consented to  Procedure(s) (LRB) with comments: FISTULECTOMY ANAL (N/A) - rectal EUA, possible fistulotomy, possible Seton as a surgical intervention .  The patient's history has been reviewed, patient examined, no change in status, stable for surgery.  I have reviewed the patient's chart and labs.  Questions were answered to the patient's satisfaction.   I have seen and examined the patient.  She denies any changes from our last visit.  I again discussed with her the risks of infection, bleeding, pain, scarring, need for wound care, recurrence, incontinence, and possible need for seton placement or for future procedures. She expressed understanding and desires to proceed with rectal EUA, possible fistulotomy, possible seton  [name]

## 2012-08-22 NOTE — Anesthesia Preprocedure Evaluation (Addendum)
Anesthesia Evaluation  Patient identified by MRN, date of birth, ID band Patient awake    Reviewed: Allergy & Precautions, H&P , NPO status , Patient's Chart, lab work & pertinent test results, reviewed documented beta blocker date and time   Airway Mallampati: I TM Distance: >3 FB Neck ROM: Full    Dental  (+) Teeth Intact and Dental Advisory Given   Pulmonary  breath sounds clear to auscultation        Cardiovascular Exercise Tolerance: Good Rhythm:Regular Rate:Normal     Neuro/Psych  Headaches,  Neuromuscular disease    GI/Hepatic   Endo/Other    Renal/GU      Musculoskeletal   Abdominal (+)  Abdomen: soft. Bowel sounds: normal.  Peds  Hematology   Anesthesia Other Findings   Reproductive/Obstetrics                         Anesthesia Physical Anesthesia Plan  ASA: I  Anesthesia Plan: General   Post-op Pain Management:    Induction: Intravenous  Airway Management Planned: LMA  Additional Equipment:   Intra-op Plan:   Post-operative Plan: Extubation in OR  Informed Consent: I have reviewed the patients History and Physical, chart, labs and discussed the procedure including the risks, benefits and alternatives for the proposed anesthesia with the patient or authorized representative who has indicated his/her understanding and acceptance.     Plan Discussed with: CRNA and Surgeon  Anesthesia Plan Comments: (Will need OET if prone)        Anesthesia Quick Evaluation

## 2012-08-22 NOTE — Brief Op Note (Signed)
08/22/2012  2:44 PM  PATIENT:  [name]  46 y.o. female  PRE-OPERATIVE DIAGNOSIS:  Anal fistula  POST-OPERATIVE DIAGNOSIS:  Anal fistula  PROCEDURE:  Procedure(s) (LRB) with comments: FISTULECTOMY ANAL (N/A) - rectal examination under anesthesia with seton placement   SURGEON:  Surgeon(s) and Role:    * Lodema Pilot, DO - Primary  PHYSICIAN ASSISTANT:   ASSISTANTS: none   ANESTHESIA:   general  EBL:     BLOOD ADMINISTERED:none  DRAINS: noncutting seton   LOCAL MEDICATIONS USED:  MARCAINE     SPECIMEN:  No Specimen  DISPOSITION OF SPECIMEN:  N/A  COUNTS:  YES  TOURNIQUET:  * No tourniquets in log *  DICTATION: .Other Dictation: Dictation Number [id number]  PLAN OF CARE: Discharge to home after PACU  PATIENT DISPOSITION:  PACU - hemodynamically stable.   Delay start of Pharmacological VTE agent (>24hrs) due to surgical blood loss or risk of bleeding: no

## 2012-08-22 NOTE — Preoperative (Signed)
Beta Blockers   Reason not to administer Beta Blockers:Not Applicable 

## 2012-08-25 ENCOUNTER — Telehealth (INDEPENDENT_AMBULATORY_CARE_PROVIDER_SITE_OTHER): Payer: Self-pay

## 2012-08-25 DIAGNOSIS — K603 Anal fistula: Secondary | ICD-10-CM

## 2012-08-25 MED ORDER — OXYCODONE HCL 5 MG PO TABS: 5.0000 mg | ORAL_TABLET | ORAL | Status: DC | PRN

## 2012-08-25 NOTE — Telephone Encounter (Signed)
The pt called to report her Hydrocodone caused a bumpy rash on her legs and arms.  Her surgery was Friday 10/18.  She stopped it and took Benadryl.  She would like something else for pain.  She is allergic to Tramadol.  I paged Dr Biagio Quint and he gave the ok for Oxycodone #50 if someone else can write it.  I will get someone in the office to write it.  She needs to come back in 10-14 days.  I will let Neysa Bonito know.

## 2012-08-25 NOTE — Addendum Note (Signed)
Addended by: Ardeth Sportsman on: 08/25/2012 11:52 AM   Modules accepted: Orders

## 2012-08-26 ENCOUNTER — Encounter (INDEPENDENT_AMBULATORY_CARE_PROVIDER_SITE_OTHER): Payer: Self-pay

## 2012-08-26 ENCOUNTER — Encounter (HOSPITAL_COMMUNITY): Payer: Self-pay | Admitting: General Surgery

## 2012-08-26 NOTE — Telephone Encounter (Signed)
Patient scheduled for post op appointment on 08/29/12 @ 8:45 w/Dr. Biagio Quint

## 2012-08-28 ENCOUNTER — Encounter (INDEPENDENT_AMBULATORY_CARE_PROVIDER_SITE_OTHER): Payer: BC Managed Care – PPO | Admitting: General Surgery

## 2012-08-29 ENCOUNTER — Encounter (INDEPENDENT_AMBULATORY_CARE_PROVIDER_SITE_OTHER): Payer: Self-pay | Admitting: General Surgery

## 2012-08-29 ENCOUNTER — Other Ambulatory Visit (INDEPENDENT_AMBULATORY_CARE_PROVIDER_SITE_OTHER): Payer: Self-pay | Admitting: General Surgery

## 2012-08-29 ENCOUNTER — Ambulatory Visit (INDEPENDENT_AMBULATORY_CARE_PROVIDER_SITE_OTHER): Payer: BC Managed Care – PPO | Admitting: General Surgery

## 2012-08-29 VITALS — BP 132/80 | HR 80 | Temp 98.0°F | Resp 18 | Ht 64.0 in | Wt 155.6 lb

## 2012-08-29 DIAGNOSIS — K603 Anal fistula: Secondary | ICD-10-CM

## 2012-08-29 NOTE — Progress Notes (Signed)
Subjective:     Patient ID: [name], female   DOB: 1966-02-26, 46 y.o.   MRN: [id number]  HPI This patient follows up 2 weeks status post rectal exam under anesthesia with seton placement for intersphinctericfistula. She has some occasional discomfort and small amount of drainage but her discomfort seems to be improved. No fevers or chills and no complaints.  Review of Systems     Objective:   Physical Exam No distress nontoxic-appearing Her seton is still in place 10 she has no evidence of infection. She has a small amount of granulation tissue at the external opening of the fistula    Assessment:     Fistula in ano Her seton is in place and it is draining well. We discussed the treatment options from here including converting this to a cutting seton versus repeat rectal exam under anesthesia with attempted fistula plug placement versus fistulotomy. We discussed the pros and cons of each and the risks and benefits of each and she is interested in possible fistula plug. We will go ahead and set her up for this. Discussed the risk of infection, bleeding, pain, scarring, recurrence or persistent fistula, incontinence and she expressed understanding and would like to proceed with rectal exam under anesthesia with possible fistulotomy or fistula plug placement     Plan:     We will set her up for rectal exam and anesthesia in about 2 weeks.

## 2012-09-05 DIAGNOSIS — K603 Anal fistula, unspecified: Secondary | ICD-10-CM

## 2012-09-05 HISTORY — DX: Anal fistula, unspecified: K60.30

## 2012-09-05 HISTORY — DX: Anal fistula: K60.3

## 2012-09-11 ENCOUNTER — Encounter (INDEPENDENT_AMBULATORY_CARE_PROVIDER_SITE_OTHER): Payer: BC Managed Care – PPO | Admitting: General Surgery

## 2012-09-11 ENCOUNTER — Encounter (HOSPITAL_BASED_OUTPATIENT_CLINIC_OR_DEPARTMENT_OTHER): Payer: Self-pay | Admitting: *Deleted

## 2012-09-17 ENCOUNTER — Encounter (HOSPITAL_BASED_OUTPATIENT_CLINIC_OR_DEPARTMENT_OTHER): Payer: Self-pay | Admitting: Anesthesiology

## 2012-09-17 ENCOUNTER — Ambulatory Visit (HOSPITAL_BASED_OUTPATIENT_CLINIC_OR_DEPARTMENT_OTHER): Payer: BC Managed Care – PPO | Admitting: Anesthesiology

## 2012-09-17 ENCOUNTER — Ambulatory Visit (HOSPITAL_BASED_OUTPATIENT_CLINIC_OR_DEPARTMENT_OTHER)
Admission: RE | Admit: 2012-09-17 | Discharge: 2012-09-17 | Disposition: A | Discharge status: Home / Self Care | Payer: BC Managed Care – PPO | Source: Ambulatory Visit | Attending: General Surgery | Admitting: General Surgery

## 2012-09-17 ENCOUNTER — Encounter (HOSPITAL_BASED_OUTPATIENT_CLINIC_OR_DEPARTMENT_OTHER): Payer: Self-pay | Admitting: *Deleted

## 2012-09-17 ENCOUNTER — Encounter (HOSPITAL_BASED_OUTPATIENT_CLINIC_OR_DEPARTMENT_OTHER)
Admission: RE | Disposition: A | Discharge status: Home / Self Care | Payer: Self-pay | Source: Ambulatory Visit | Attending: General Surgery

## 2012-09-17 DIAGNOSIS — K602 Anal fissure, unspecified: Secondary | ICD-10-CM

## 2012-09-17 DIAGNOSIS — K603 Anal fistula, unspecified: Secondary | ICD-10-CM | POA: Insufficient documentation

## 2012-09-17 HISTORY — PX: EXAMINATION UNDER ANESTHESIA: SHX1540

## 2012-09-17 HISTORY — DX: Anal fistula: K60.3

## 2012-09-17 HISTORY — PX: FISTULA PLUG: SHX5831

## 2012-09-17 HISTORY — PX: ANAL FISTULECTOMY: SHX1139

## 2012-09-17 SURGERY — EXAM UNDER ANESTHESIA
Anesthesia: General | Site: Rectum | Wound class: Dirty or Infected

## 2012-09-17 MED ORDER — LEVOFLOXACIN IN D5W 500 MG/100ML IV SOLN
500.0000 mg | Freq: Once | INTRAVENOUS | Status: DC
Start: 2012-09-17 — End: 2012-09-17
  Filled 2012-09-17: qty 100

## 2012-09-17 MED ORDER — MIDAZOLAM HCL 5 MG/5ML IJ SOLN
INTRAMUSCULAR | Status: DC | PRN
  Administered 2012-09-17: 2 mg via INTRAVENOUS

## 2012-09-17 MED ORDER — PROPOFOL 10 MG/ML IV BOLUS
INTRAVENOUS | Status: DC | PRN
  Administered 2012-09-17: 150 mg via INTRAVENOUS

## 2012-09-17 MED ORDER — FLEET ENEMA 7-19 GM/118ML RE ENEM
1.0000 | ENEMA | Freq: Once | RECTAL | Status: DC
Start: 2012-09-17 — End: 2012-09-17

## 2012-09-17 MED ORDER — FENTANYL CITRATE 0.05 MG/ML IJ SOLN
INTRAMUSCULAR | Status: DC | PRN
  Administered 2012-09-17: 25 ug via INTRAVENOUS
  Administered 2012-09-17: 50 ug via INTRAVENOUS
  Administered 2012-09-17: 25 ug via INTRAVENOUS
  Administered 2012-09-17: 50 ug via INTRAVENOUS

## 2012-09-17 MED ORDER — FENTANYL CITRATE 0.05 MG/ML IJ SOLN
25.0000 ug | INTRAMUSCULAR | Status: DC | PRN
  Administered 2012-09-17: 50 ug via INTRAVENOUS
  Administered 2012-09-17 (×2): 25 ug via INTRAVENOUS

## 2012-09-17 MED ORDER — DEXAMETHASONE SODIUM PHOSPHATE 4 MG/ML IJ SOLN
INTRAMUSCULAR | Status: DC | PRN
  Administered 2012-09-17: 10 mg via INTRAVENOUS

## 2012-09-17 MED ORDER — OXYCODONE HCL 5 MG PO TABS: 5.0000 mg | ORAL_TABLET | ORAL | Status: DC | PRN

## 2012-09-17 MED ORDER — SUCCINYLCHOLINE CHLORIDE 20 MG/ML IJ SOLN
INTRAMUSCULAR | Status: DC | PRN
  Administered 2012-09-17: 100 mg via INTRAVENOUS

## 2012-09-17 MED ORDER — ONDANSETRON HCL 4 MG/2ML IJ SOLN
INTRAMUSCULAR | Status: DC | PRN
  Administered 2012-09-17: 4 mg via INTRAVENOUS

## 2012-09-17 MED ORDER — LIDOCAINE HCL (CARDIAC) 20 MG/ML IV SOLN
INTRAVENOUS | Status: DC | PRN
  Administered 2012-09-17: 50 mg via INTRAVENOUS

## 2012-09-17 MED ORDER — PHENYLEPHRINE HCL 10 MG/ML IJ SOLN
INTRAMUSCULAR | Status: DC | PRN
  Administered 2012-09-17 (×2): 40 ug via INTRAVENOUS

## 2012-09-17 MED ORDER — DOCUSATE SODIUM 100 MG PO CAPS: 100.0000 mg | ORAL_CAPSULE | Freq: Two times a day (BID) | ORAL | Status: DC

## 2012-09-17 MED ORDER — BUPIVACAINE LIPOSOME 1.3 % IJ SUSP
INTRAMUSCULAR | Status: DC | PRN
  Administered 2012-09-17: 20 mL

## 2012-09-17 MED ORDER — LEVOFLOXACIN IN D5W 500 MG/100ML IV SOLN
500.0000 mg | Freq: Once | INTRAVENOUS | Status: AC
  Administered 2012-09-17: .5 g via INTRAVENOUS

## 2012-09-17 MED ORDER — OXYCODONE HCL 5 MG PO TABS
5.0000 mg | ORAL_TABLET | Freq: Once | ORAL | Status: AC
  Administered 2012-09-17: 5 mg via ORAL

## 2012-09-17 MED ORDER — LACTATED RINGERS IV SOLN
INTRAVENOUS | Status: DC
  Administered 2012-09-17 (×2): via INTRAVENOUS

## 2012-09-17 SURGICAL SUPPLY — 52 items
APL SKNCLS STERI-STRIP NONHPOA (GAUZE/BANDAGES/DRESSINGS) ×3
BENZOIN TINCTURE PRP APPL 2/3 (GAUZE/BANDAGES/DRESSINGS) ×4 IMPLANT
BLADE SURG 11 STRL SS (BLADE) IMPLANT
BLADE SURG 15 STRL LF DISP TIS (BLADE) ×1 IMPLANT
BLADE SURG 15 STRL SS (BLADE) ×2
BRIEF STRETCH FOR OB PAD LRG (UNDERPADS AND DIAPERS) ×2 IMPLANT
CANISTER SUCTION 1200CC (MISCELLANEOUS) ×2 IMPLANT
COVER MAYO STAND STRL (DRAPES) ×2 IMPLANT
COVER TABLE BACK 60X90 (DRAPES) ×2 IMPLANT
DECANTER SPIKE VIAL GLASS SM (MISCELLANEOUS) IMPLANT
DRAPE PED LAPAROTOMY (DRAPES) ×2 IMPLANT
DRAPE UTILITY XL STRL (DRAPES) ×2 IMPLANT
DRSG PAD ABDOMINAL 8X10 ST (GAUZE/BANDAGES/DRESSINGS) IMPLANT
ELECT COATED BLADE 2.86 ST (ELECTRODE) ×2 IMPLANT
ELECT REM PT RETURN 9FT ADLT (ELECTROSURGICAL) ×2
ELECTRODE REM PT RTRN 9FT ADLT (ELECTROSURGICAL) ×1 IMPLANT
GAUZE SPONGE 4X4 12PLY STRL LF (GAUZE/BANDAGES/DRESSINGS) ×2 IMPLANT
GAUZE VASELINE 1X8 (GAUZE/BANDAGES/DRESSINGS) ×2 IMPLANT
GLOVE BIO SURGEON STRL SZ 6.5 (GLOVE) ×1 IMPLANT
GLOVE BIO SURGEON STRL SZ7.5 (GLOVE) ×1 IMPLANT
GLOVE BIOGEL M STER SZ 6 (GLOVE) ×1 IMPLANT
GLOVE BIOGEL PI IND STRL 7.0 (GLOVE) IMPLANT
GLOVE BIOGEL PI IND STRL 8 (GLOVE) IMPLANT
GLOVE BIOGEL PI INDICATOR 7.0 (GLOVE) ×1
GLOVE BIOGEL PI INDICATOR 8 (GLOVE) ×1
GLOVE SURG SS PI 7.5 STRL IVOR (GLOVE) ×4 IMPLANT
GLOVE SURG SS PI 8.5 STRL IVOR (GLOVE) ×1
GLOVE SURG SS PI 8.5 STRL STRW (GLOVE) ×1 IMPLANT
GOWN PREVENTION PLUS XLARGE (GOWN DISPOSABLE) ×1 IMPLANT
GOWN PREVENTION PLUS XXLARGE (GOWN DISPOSABLE) ×4 IMPLANT
KIT ANAL FISTULA (Mesh General) ×1 IMPLANT
NDL HYPO 25X1 1.5 SAFETY (NEEDLE) ×1 IMPLANT
NEEDLE HYPO 25X1 1.5 SAFETY (NEEDLE) ×2 IMPLANT
PACK BASIN DAY SURGERY FS (CUSTOM PROCEDURE TRAY) ×2 IMPLANT
PENCIL BUTTON HOLSTER BLD 10FT (ELECTRODE) ×2 IMPLANT
SHEARS HARMONIC 9CM CVD (BLADE) ×1 IMPLANT
SLEEVE SCD COMPRESS KNEE MED (MISCELLANEOUS) ×1 IMPLANT
SPONGE GAUZE 4X4 12PLY (GAUZE/BANDAGES/DRESSINGS) ×2 IMPLANT
SPONGE SURGIFOAM ABS GEL 100 (HEMOSTASIS) IMPLANT
SURGILUBE 2OZ TUBE FLIPTOP (MISCELLANEOUS) ×2 IMPLANT
SUT CHROMIC 3 0 SH 27 (SUTURE) ×3 IMPLANT
SUT VIC AB 3-0 SH 27 (SUTURE) ×2
SUT VIC AB 3-0 SH 27X BRD (SUTURE) IMPLANT
SYR CONTROL 10ML LL (SYRINGE) ×2 IMPLANT
TAPE CLOTH 3X10 TAN LF (GAUZE/BANDAGES/DRESSINGS) ×2 IMPLANT
TOWEL OR 17X24 6PK STRL BLUE (TOWEL DISPOSABLE) ×4 IMPLANT
TOWEL OR NON WOVEN STRL DISP B (DISPOSABLE) ×2 IMPLANT
TRAY DSU PREP LF (CUSTOM PROCEDURE TRAY) ×2 IMPLANT
TUBE CONNECTING 20X1/4 (TUBING) ×2 IMPLANT
UNDERPAD 30X30 INCONTINENT (UNDERPADS AND DIAPERS) IMPLANT
WATER STERILE IRR 1000ML POUR (IV SOLUTION) ×2 IMPLANT
YANKAUER SUCT BULB TIP NO VENT (SUCTIONS) ×2 IMPLANT

## 2012-09-17 NOTE — Interval H&P Note (Signed)
History and Physical Interval Note:  09/17/2012 10:46 AM  [name]  has presented today for surgery, with the diagnosis of anal fistula  The various methods of treatment have been discussed with the patient and family. After consideration of risks, benefits and other options for treatment, the patient has consented to  Procedure(s) (LRB) with comments: EXAM UNDER ANESTHESIA (N/A) - Rectal exam under anesthesia FISTULECTOMY ANAL (N/A) - possible fistulotomy FISTULA PLUG (N/A) - Possible fistula plug as a surgical intervention .  The patient's history has been reviewed, patient examined, no change in status, stable for surgery.  I have reviewed the patient's chart and labs.  Questions were answered to the patient's satisfaction.  She has had prior seton placement for intersphincteric fistula.  She has been feeling better but drainage persists.  Otherwise she denies any changes from prior exam.  I again discussed with her the planned procedure of infection, bleeding, pain, scarring, recurrence, persistent drainage and fistula, and possible fistulotomy and incontinence and she expressed understanding and desires to proceed with rectal EUA, possible fistulotomy, and possible fistula plug.   Lodema Pilot DAVID

## 2012-09-17 NOTE — Brief Op Note (Signed)
09/17/2012  12:40 PM  PATIENT:  [name]  46 y.o. female  PRE-OPERATIVE DIAGNOSIS:  anal fistula  POST-OPERATIVE DIAGNOSIS:  anal fistula  PROCEDURE:  Procedure(s) (LRB) with comments: EXAM UNDER ANESTHESIA (N/A) - Rectal exam under anesthesia FISTULECTOMY ANAL (N/A) - possible fistulotomy FISTULA PLUG (N/A) - Possible fistula plug  SURGEON:  Surgeon(s) and Role:    * Lodema Pilot, DO - Primary    * Romie Levee, MD - Assisting  PHYSICIAN ASSISTANT:   ASSISTANTS: Thomas   ANESTHESIA:   local and general  EBL:  Total I/O In: 1000 [I.V.:1000] Out: -   BLOOD ADMINISTERED:none  DRAINS: none   LOCAL MEDICATIONS USED:  MARCAINE     SPECIMEN:  No Specimen  DISPOSITION OF SPECIMEN:  N/A  COUNTS:  YES  TOURNIQUET:  * No tourniquets in log *  DICTATION: .Other Dictation: Dictation Number   PLAN OF CARE: Discharge to home after PACU  PATIENT DISPOSITION:  PACU - hemodynamically stable.   Delay start of Pharmacological VTE agent (>24hrs) due to surgical blood loss or risk of bleeding: no

## 2012-09-17 NOTE — Anesthesia Preprocedure Evaluation (Signed)
Anesthesia Evaluation  Patient identified by MRN, date of birth, ID band Patient awake    Reviewed: Allergy & Precautions, H&P , NPO status , Patient's Chart, lab work & pertinent test results  Airway Mallampati: II TM Distance: >3 FB Neck ROM: Full    Dental No notable dental hx. (+) Teeth Intact and Dental Advisory Given   Pulmonary neg pulmonary ROS,  breath sounds clear to auscultation  Pulmonary exam normal       Cardiovascular negative cardio ROS  Rhythm:Regular Rate:Normal     Neuro/Psych negative neurological ROS  negative psych ROS   GI/Hepatic negative GI ROS, Neg liver ROS,   Endo/Other  negative endocrine ROS  Renal/GU negative Renal ROS  negative genitourinary   Musculoskeletal   Abdominal   Peds  Hematology negative hematology ROS (+)   Anesthesia Other Findings   Reproductive/Obstetrics negative OB ROS                           Anesthesia Physical Anesthesia Plan  ASA: I  Anesthesia Plan: General   Post-op Pain Management:    Induction: Intravenous  Airway Management Planned: Oral ETT  Additional Equipment:   Intra-op Plan:   Post-operative Plan: Extubation in OR  Informed Consent: I have reviewed the patients History and Physical, chart, labs and discussed the procedure including the risks, benefits and alternatives for the proposed anesthesia with the patient or authorized representative who has indicated his/her understanding and acceptance.   Dental advisory given  Plan Discussed with: CRNA  Anesthesia Plan Comments:         Anesthesia Quick Evaluation  

## 2012-09-17 NOTE — Anesthesia Postprocedure Evaluation (Signed)
  Anesthesia Post-op Note  Patient: [name]  Procedure(s) Performed: Procedure(s) (LRB) with comments: EXAM UNDER ANESTHESIA (N/A) - Rectal exam under anesthesia FISTULECTOMY ANAL (N/A) - possible fistulotomy FISTULA PLUG (N/A) - Possible fistula plug  Patient Location: PACU  Anesthesia Type:General  Level of Consciousness: awake, alert  and oriented  Airway and Oxygen Therapy: Patient Spontanous Breathing  Post-op Pain: mild  Post-op Assessment: Post-op Vital signs reviewed, Patient's Cardiovascular Status Stable, Respiratory Function Stable, Patent Airway and No signs of Nausea or vomiting  Post-op Vital Signs: Reviewed and stable  Complications: No apparent anesthesia complications

## 2012-09-17 NOTE — Op Note (Signed)
NAMELOYS, CWIKLA NO.:  [id number]  MEDICAL RECORD NO.:  [id number]  LOCATION:                                 FACILITY:  PHYSICIAN:  Lodema Pilot, MD       DATE OF BIRTH:  November 11, 1965  DATE OF PROCEDURE:  09/17/2012 DATE OF DISCHARGE:                              OPERATIVE REPORT   PROCEDURE:  Rectal exam under anesthesia with mucosal advancement flap and placement of anal fistula plug.  SURGEON:  Lodema Pilot, MD  ASSISTANT:  Dr. Maisie Fus.  ANESTHESIA:  General endotracheal anesthesia with 20 mL of Exparel.  FLUIDS:  1300 mL crystalloid.  ESTIMATED BLOOD LOSS:  Minimal.  DRAINS:  None.  SPECIMENS:  None.  COMPLICATIONS:  None apparent.  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  COMPLICATIONS:  None apparent.  INDICATION FOR PROCEDURE:  Ms. Jobin is a 46 year old female who had incision and drainage of perirectal abscess in September who had what felt was nonhealing of the wound, but had persistent drainage and underwent rectal exam under anesthesia and was found to have well- developed anal fistula.  I placed a seton for drainage of her chronic infection and this has been in place for 3-4 weeks and now we planned for a more definitive treatment.  OPERATIVE DETAILS:  Ms. Desir was seen and evaluated in the preoperative area.  The risks and benefits of the procedure were discussed in lay terms.  Informed consent was obtained.  She was taken to the operating room and general endotracheal anesthesia was obtained.  Prophylactic antibiotics were given.  She was flipped in a prone jack-knife position and previously placed seton was visualized at the 7 o'clock position. She had some chronic small amount of purulent drainage from this area. The tract was well developed and again easily cannulated.  Dr.  Maisie Fus and I palpated the area with anal fistula probe intact.  There was just a small amount of internal sphincter muscle superficial to the probe and then we thought that anal fistulotomy would probably be the most definitive treatment for her and would involve minimal muscles and I started to divide the skin on top of the fistula probe.  However it did appear to involve some of the external sphincter and so I did not divide any of the sphincter and at this point, we decided to abandon the fistulotomy and planned for fistula plug placement.  I excised the external chronic granulation tissue and then abraded and debrided the internal tract with the provided type clamps in the [facility] anal fistula plug.  Injected the tract with hydrogen peroxide and then flushed this with saline solution and then a mucosal advancement flap was performed with the proximal mucosa superficial to the sphincter muscles and the tissue around the internal opening was excised and debrided.  The fistula plug was then pulled from the internal opening to the external opening.  The plug was sutured in place in standard fashion with perpendicular sutures with the provided 3-0 Vicryl suture.  The sutures were secured and  this approximated the mucosa over the internal fistula opening and then the mucosal advancement flap was sutured down over the internal opening as well with 3-0 chromic sutures.  She also had some redundant hemorrhoidal tissue and hemorrhoidopexy was used to suture over the advancement flap as well with 3-0 Vicryl suture and the fistula plug was cut, flushed along the tract.  The wound was inspected for hemostasis which was noted to be adequate and the area was injected with 20 mL of Exparel anesthetic.  All sponge, needle, and instrument counts were correct at the end of the case.  The patient tolerated the procedure well without apparent complications.          ______________________________ Lodema Pilot, MD     BL/MEDQ  D:  09/17/2012  T:  09/17/2012  Job:  [id number]

## 2012-09-17 NOTE — Anesthesia Procedure Notes (Signed)
Procedure Name: Intubation Date/Time: 09/17/2012 11:21 AM Performed by: Zenia Resides D Pre-anesthesia Checklist: Emergency Drugs available, Suction available, Patient being monitored, Patient identified and Timeout performed Patient Re-evaluated:Patient Re-evaluated prior to inductionOxygen Delivery Method: Circle System Utilized Preoxygenation: Pre-oxygenation with 100% oxygen Intubation Type: IV induction Ventilation: Mask ventilation without difficulty Laryngoscope Size: Mac and 3 Grade View: Grade I Tube type: Oral Tube size: 7.0 mm Number of attempts: 1 Airway Equipment and Method: stylet and oral airway Placement Confirmation: ETT inserted through vocal cords under direct vision,  positive ETCO2 and breath sounds checked- equal and bilateral Secured at: 21 cm Tube secured with: Tape Dental Injury: Teeth and Oropharynx as per pre-operative assessment

## 2012-09-17 NOTE — Transfer of Care (Signed)
Immediate Anesthesia Transfer of Care Note  Patient: [name]  Procedure(s) Performed: Procedure(s) (LRB) with comments: EXAM UNDER ANESTHESIA (N/A) - Rectal exam under anesthesia FISTULECTOMY ANAL (N/A) - possible fistulotomy FISTULA PLUG (N/A) - Possible fistula plug  Patient Location: PACU  Anesthesia Type:General  Level of Consciousness: awake, alert  and oriented  Airway & Oxygen Therapy: Patient Spontanous Breathing and Patient connected to face mask oxygen  Post-op Assessment: Report given to PACU RN and Post -op Vital signs reviewed and stable  Post vital signs: Reviewed and stable  Complications: No apparent anesthesia complications

## 2012-09-17 NOTE — H&P (View-Only) (Signed)
Subjective:     Patient ID: [name], female   DOB: 05/11/1966, 45 y.o.   MRN: [id number]  HPI This patient follows up 2 weeks status post rectal exam under anesthesia with seton placement for intersphinctericfistula. She has some occasional discomfort and small amount of drainage but her discomfort seems to be improved. No fevers or chills and no complaints.  Review of Systems     Objective:   Physical Exam No distress nontoxic-appearing Her seton is still in place 10 she has no evidence of infection. She has a small amount of granulation tissue at the external opening of the fistula    Assessment:     Fistula in ano Her seton is in place and it is draining well. We discussed the treatment options from here including converting this to a cutting seton versus repeat rectal exam under anesthesia with attempted fistula plug placement versus fistulotomy. We discussed the pros and cons of each and the risks and benefits of each and she is interested in possible fistula plug. We will go ahead and set her up for this. Discussed the risk of infection, bleeding, pain, scarring, recurrence or persistent fistula, incontinence and she expressed understanding and would like to proceed with rectal exam under anesthesia with possible fistulotomy or fistula plug placement     Plan:     We will set her up for rectal exam and anesthesia in about 2 weeks.      

## 2012-09-18 MED FILL — Levofloxacin in D5W IV Soln 500 MG/100ML: INTRAVENOUS | Qty: 100 | Status: AC

## 2012-09-19 NOTE — Addendum Note (Signed)
Addendum  created 09/19/12 1021 by Lance Coon, CRNA   Modules edited:Anesthesia Medication Administration

## 2012-09-19 NOTE — Addendum Note (Signed)
Addendum  created 09/19/12 1021 by Weston Kaylub Detienne, CRNA   Modules edited:Anesthesia Medication Administration    

## 2012-09-22 ENCOUNTER — Encounter (HOSPITAL_BASED_OUTPATIENT_CLINIC_OR_DEPARTMENT_OTHER): Payer: Self-pay | Admitting: General Surgery

## 2012-09-28 ENCOUNTER — Emergency Department (HOSPITAL_COMMUNITY)
Admission: EM | Admit: 2012-09-28 | Discharge: 2012-09-28 | Disposition: A | Discharge status: Home / Self Care | Payer: BC Managed Care – PPO | Attending: Emergency Medicine | Admitting: Emergency Medicine

## 2012-09-28 ENCOUNTER — Encounter (HOSPITAL_COMMUNITY): Payer: Self-pay | Admitting: *Deleted

## 2012-09-28 ENCOUNTER — Emergency Department (HOSPITAL_COMMUNITY): Payer: BC Managed Care – PPO

## 2012-09-28 DIAGNOSIS — E785 Hyperlipidemia, unspecified: Secondary | ICD-10-CM | POA: Insufficient documentation

## 2012-09-28 DIAGNOSIS — Z79899 Other long term (current) drug therapy: Secondary | ICD-10-CM | POA: Insufficient documentation

## 2012-09-28 DIAGNOSIS — R109 Unspecified abdominal pain: Secondary | ICD-10-CM | POA: Insufficient documentation

## 2012-09-28 DIAGNOSIS — Z8719 Personal history of other diseases of the digestive system: Secondary | ICD-10-CM | POA: Insufficient documentation

## 2012-09-28 DIAGNOSIS — K5900 Constipation, unspecified: Secondary | ICD-10-CM

## 2012-09-28 DIAGNOSIS — K6289 Other specified diseases of anus and rectum: Secondary | ICD-10-CM | POA: Insufficient documentation

## 2012-09-28 MED ORDER — POLYETHYLENE GLYCOL 3350 17 GM/SCOOP PO POWD: 17.0000 g | Freq: Two times a day (BID) | ORAL | Status: DC

## 2012-09-28 NOTE — ED Notes (Signed)
Patient had procedure to repair a rectal fistula.  She states she is having pain and she cannot pass stool.  The procedure was done on Thursday. Patient has small amount of bleeding only

## 2012-09-28 NOTE — ED Provider Notes (Signed)
History  This chart was scribed for Diane Lyons, MD by Shari Heritage, ED Scribe. The patient was seen in room TR10C/TR10C. Patient's care was started at 1520.  CSN: [id number]  Arrival date & time 09/28/12  1446   First MD Initiated Contact with Patient 09/28/12 1520      Chief Complaint  Patient presents with  . Constipation     The history is provided by the patient. No language interpreter was used.    HPI Comments: CATHI HAZAN is a 46 y.o. female who presents to the Emergency Department complaining of persistent constipation and moderate, constant, non-radiating rectal pain onset 1 week ago. There is associated mild abdominal cramping. Patient states that her last BM was sometime last week. Patient was prescribed Colace 100mg  to take twice daily with Oxycodone 5 mg for pain. Patient says the Colace and pain medication have provided little relief. Patient says that she took three laxatives today with no change. Per medical records, patient had an anal fistulectomy and fistula plug procedure done on Wednesday, 09/17/12 by Lodema Pilot, DO at [facility]. Patient states that she had a rectal fistula that needed to be repaired. Patient's other medical history includes hyperlipidemia. She does not smoke or drink alcohol.    Past Medical History  Diagnosis Date  . Hyperlipidemia   . Anal fistula 09/2012    Past Surgical History  Procedure Date  . Irrigation and debridement abscess 07/08/2012    Procedure: MINOR INCISION AND DRAINAGE OF ABSCESS;  Surgeon: Geryl Rankins, MD;  Location: [facility];  Service: Gynecology;  Laterality: N/A;  Vulvar Abscess  . Anal fistulectomy 08/22/2012    Procedure: FISTULECTOMY ANAL;  Surgeon: Lodema Pilot, DO;  Location: [facility] OR;  Service: General;  Laterality: N/A;  rectal examination under anesthesia with seton placement   . Hemorrhoid surgery 04/28/2009    PPH  . Hysteroscopy with novasure 01/10/2007  . Hemorrhoid surgery 01/10/2007      I & D thrombosed hemorrhoid  . Examination under anesthesia 09/17/2012    Procedure: EXAM UNDER ANESTHESIA;  Surgeon: Lodema Pilot, DO;  Location: [facility];  Service: General;  Laterality: N/A;  Rectal exam under anesthesia  . Anal fistulectomy 09/17/2012    Procedure: FISTULECTOMY ANAL;  Surgeon: Lodema Pilot, DO;  Location: [facility];  Service: General;  Laterality: N/A;  possible fistulotomy  . Fistula plug 09/17/2012    Procedure: FISTULA PLUG;  Surgeon: Lodema Pilot, DO;  Location: [facility];  Service: General;  Laterality: N/A;  Possible fistula plug    No family history on file.  History  Substance Use Topics  . Smoking status: Never Smoker   . Smokeless tobacco: Never Used  . Alcohol Use: No    OB History    Grav Para Term Preterm Abortions TAB SAB Ect Mult Living                  Review of Systems  Gastrointestinal: Positive for abdominal pain (mild cramping), constipation and rectal pain.  All other systems reviewed and are negative.    Allergies  Ampicillin; Penicillins; Sulfonamide derivatives; Hydrocodone-acetaminophen; and Tramadol  Home Medications   Current Outpatient Rx  Name  Route  Sig  Dispense  Refill  . DOCUSATE SODIUM 100 MG PO CAPS   Oral   Take 1 capsule (100 mg total) by mouth 2 (two) times daily.   60 capsule   0   . IBUPROFEN 200 MG  PO TABS   Oral   Take 200 mg by mouth every 6 (six) hours as needed. For pain         . LEVONORGEST-ETH ESTRAD 91-DAY 0.15-0.03 MG PO TABS   Oral   Take 1 tablet by mouth daily.         . OXYCODONE HCL 5 MG PO TABS   Oral   Take 1-2 tablets (5-10 mg total) by mouth every 4 (four) hours as needed for pain.   50 tablet   0     Triage Vitals: BP 145/85  Pulse 81  Temp 98.7 F (37.1 C) (Oral)  Resp 18  Ht 5\' 4"  (1.626 m)  Wt 157 lb (71.215 kg)  BMI 26.95 kg/m2  SpO2 100%  Physical Exam  Constitutional: She is oriented to person, place, and  time. She appears well-developed and well-nourished. No distress.  HENT:  Head: Normocephalic and atraumatic.  Eyes: EOM are normal.  Neck: Normal range of motion.  Cardiovascular: Normal rate.   Pulmonary/Chest: Effort normal.  Abdominal: Soft. Bowel sounds are normal. She exhibits no distension. There is no tenderness. There is no rebound and no guarding.  Genitourinary:       Beside the rectum, there is a small healing incision with no purulent drainage or surrounding erythema.    Musculoskeletal: Normal range of motion.  Neurological: She is alert and oriented to person, place, and time.  Skin: Skin is warm and dry. No rash noted.  Psychiatric: She has a normal mood and affect. Her behavior is normal.    ED Course  Procedures (including critical care time) DIAGNOSTIC STUDIES: Oxygen Saturation is 100% on room air, normal by my interpretation.    COORDINATION OF CARE: 3:31 PM- Patient informed of current plan for treatment and evaluation and agrees with plan at this time. Will order x-ray of abdomen.   4:56 PM- Spoke with general surgery about patient's case.    Dg Abd 1 View  09/28/2012  *RADIOLOGY REPORT*  Clinical Data: Constipation.  ABDOMEN - 1 VIEW  Comparison: CT abdomen and pelvis 01/30/2009.  Findings: Large stool burden is present throughout the colon.  1 cm stone projects over the lower pole of the left kidney.  No evidence of small bowel obstruction.  No focal bony abnormality.  IMPRESSION:  1.  Large stool burden diffusely. 2.  1 cm stone lower pole left kidney.   Original Report Authenticated By: Holley Dexter, M.D.      No diagnosis found.    MDM  Xrays show increased stool burden.  I spoke with Dr. Carolynne Edouard who was on for surgery to discuss.  He advised against any enemas due to the procedure she had performed.  He recommends miralax and gatorade.  Will try this.  Return prn.        I personally performed the services described in this documentation,  which was scribed in my presence. The recorded information has been reviewed and is accurate.      Diane Lyons, MD 09/28/12 [phone]

## 2012-10-01 ENCOUNTER — Encounter (INDEPENDENT_AMBULATORY_CARE_PROVIDER_SITE_OTHER): Payer: Self-pay | Admitting: General Surgery

## 2012-10-01 ENCOUNTER — Ambulatory Visit (INDEPENDENT_AMBULATORY_CARE_PROVIDER_SITE_OTHER): Payer: BC Managed Care – PPO | Admitting: General Surgery

## 2012-10-01 VITALS — BP 128/82 | HR 90 | Temp 97.8°F | Resp 18 | Ht 64.0 in | Wt 158.0 lb

## 2012-10-01 DIAGNOSIS — K603 Anal fistula: Secondary | ICD-10-CM

## 2012-10-01 DIAGNOSIS — Z5189 Encounter for other specified aftercare: Secondary | ICD-10-CM

## 2012-10-01 DIAGNOSIS — K612 Anorectal abscess: Secondary | ICD-10-CM

## 2012-10-01 DIAGNOSIS — Z4889 Encounter for other specified surgical aftercare: Secondary | ICD-10-CM

## 2012-10-01 MED ORDER — OXYCODONE HCL 5 MG PO TABS: 5.0000 mg | ORAL_TABLET | ORAL | Status: DC | PRN

## 2012-10-01 MED ORDER — DOCUSATE SODIUM 100 MG PO CAPS: 100.0000 mg | ORAL_CAPSULE | Freq: Two times a day (BID) | ORAL | Status: DC

## 2012-10-01 NOTE — Progress Notes (Signed)
Subjective:     Patient ID: [name], female   DOB: 1966/01/14, 46 y.o.   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  She still has some discomfort in the area of and is doing her sitz bath and now that she is on MiraLAX has been doing much better. She had some constipation initially and is taking MiraLAX and Colace.  Review of Systems     Objective:   Physical Exam Her wound is healing nicely. She has a normal fibrinous exudate but does not appear to have any purulence or sign of infection.    Assessment:     Status post rectal exam under anesthesia and this was a fistula plug She's doing well and her wound is healing nicely. It is hard to tell at this point whether the fistula has been completely cleared of such he still has an open wound. I recommended that she continue with her current wound care and I will see her back in about 3 weeks.     Plan:     See her back in 3 weeks. I refilled her pain medication and her stool softeners.

## 2012-10-17 ENCOUNTER — Telehealth (INDEPENDENT_AMBULATORY_CARE_PROVIDER_SITE_OTHER): Payer: Self-pay

## 2012-10-17 NOTE — Telephone Encounter (Signed)
Pt called wanting to know if Dr Biagio Quint would call in  another refill on oxycodone. Pt is one month post rectal surgery. Pt advised oxycodone cannot be called in. Pt states she is doing well but has occasional soreness. Pt advised to use advil tid and if this does not handle discomfort pt will call back for MD to review request for narcotic. Pt states she is ok trying this.

## 2012-10-22 ENCOUNTER — Encounter (INDEPENDENT_AMBULATORY_CARE_PROVIDER_SITE_OTHER): Payer: Self-pay

## 2012-10-24 ENCOUNTER — Encounter (INDEPENDENT_AMBULATORY_CARE_PROVIDER_SITE_OTHER): Payer: Self-pay | Admitting: General Surgery

## 2012-10-24 ENCOUNTER — Ambulatory Visit (INDEPENDENT_AMBULATORY_CARE_PROVIDER_SITE_OTHER): Payer: BC Managed Care – PPO | Admitting: General Surgery

## 2012-10-24 VITALS — BP 130/78 | HR 76 | Temp 97.8°F | Resp 18 | Ht 64.0 in | Wt 158.4 lb

## 2012-10-24 DIAGNOSIS — Z5189 Encounter for other specified aftercare: Secondary | ICD-10-CM

## 2012-10-24 DIAGNOSIS — Z4889 Encounter for other specified surgical aftercare: Secondary | ICD-10-CM

## 2012-10-24 NOTE — Progress Notes (Signed)
Subjective:     Patient ID: [name], female   DOB: 03-23-1966, 46 y.o.   MRN: [id number]  HPI Ms. Mallin follows up status post anal fistula plug with mucosal advancement flap.  She still has a little drainage in the area of and some occasional sharp pains. She is off pain medication and is taking Motrin as needed. She continues with her stool softeners.  Review of Systems     Objective:   Physical Exam The cavity is completely healed in but she does have some proud granulation tissue in the area and I think that is what is responsible for her drainage. I do not see any fistula openings. I did apply some silver nitrate to the granulation tissue    Assessment:     Status post anal fistula plug with mucosal advancement flap I think that the fistula is healing. Type licenser for nitrate to the wound and I would like to see her back in another week to recheck the wound and possibly reapply the silver nitrate.    Plan:     followup one week

## 2012-10-31 ENCOUNTER — Encounter (INDEPENDENT_AMBULATORY_CARE_PROVIDER_SITE_OTHER): Payer: Self-pay | Admitting: General Surgery

## 2012-10-31 ENCOUNTER — Ambulatory Visit (INDEPENDENT_AMBULATORY_CARE_PROVIDER_SITE_OTHER): Payer: BC Managed Care – PPO | Admitting: General Surgery

## 2012-10-31 VITALS — BP 136/82 | HR 88 | Temp 98.2°F | Resp 12 | Ht 64.0 in | Wt 158.0 lb

## 2012-10-31 DIAGNOSIS — Z4889 Encounter for other specified surgical aftercare: Secondary | ICD-10-CM

## 2012-10-31 DIAGNOSIS — Z5189 Encounter for other specified aftercare: Secondary | ICD-10-CM

## 2012-10-31 NOTE — Progress Notes (Signed)
Subjective:     Patient ID: [name], female   DOB: 1966-03-30, 46 y.o.   MRN: [id number]  HPI This patient follows up status post anal fistula plug placement and mucosal advancement flap. She still has some drainage in the area but it is less than previous. She says her discomfort is less as well  Review of Systems     Objective:   Physical Exam She still has some granulation tissue but I do not appreciate any fistula tract. I think that the drainage is from some proud granulation tissue and applied some silver nitrate again today.    Assessment:     Status post anal fistula plug placement and mucosal advancement flap I think this fistula is basically healed and that the drainage is due to some granulation tissue. I again applied some silver nitrate to the wound and I recommended that if she continues to have drainage in the area, to call me back in one week and will set her up for an appointment to see myself or Dr. Maisie Fus    Plan:     She will come back in one week and let me know how she is doing.

## 2013-03-19 ENCOUNTER — Encounter (INDEPENDENT_AMBULATORY_CARE_PROVIDER_SITE_OTHER): Payer: Self-pay | Admitting: General Surgery

## 2013-03-19 ENCOUNTER — Ambulatory Visit (INDEPENDENT_AMBULATORY_CARE_PROVIDER_SITE_OTHER): Payer: BC Managed Care – PPO | Admitting: General Surgery

## 2013-03-19 ENCOUNTER — Encounter (INDEPENDENT_AMBULATORY_CARE_PROVIDER_SITE_OTHER): Payer: Self-pay

## 2013-03-19 VITALS — BP 136/82 | HR 78 | Resp 18 | Ht 64.0 in | Wt 155.0 lb

## 2013-03-19 DIAGNOSIS — K603 Anal fistula: Secondary | ICD-10-CM

## 2013-03-19 MED ORDER — OXYCODONE HCL 5 MG PO TABS: 5.0000 mg | ORAL_TABLET | Freq: Three times a day (TID) | ORAL | Status: DC | PRN

## 2013-03-19 MED ORDER — LIDOCAINE HCL 2 % EX GEL: CUTANEOUS | Status: DC

## 2013-03-19 NOTE — Progress Notes (Signed)
Subjective:     Patient ID: [name], female   DOB: Aug 16, 1966, 47 y.o.   MRN: [id number]  HPI This patient follows upabout 6 months status post anal fistula plug placement with mucosal advancement flap for an anal fistula.  She had been doing well since December and hasn't had any drainage or discomfort until a few weeks ago when she had some discomfort in the same area and then she started leaking from the same area. Since then she says that she has had consistent drainage and has been wearing pads to control the drainage. She also has some popping and pins and needles pain in the area. She says that she has been having some difficulty with hygiene as well. She's about the been functioning normally until about the time when she had this recurrent drainage and at that time she had some constipation.   Review of Systems     Objective:   Physical Exam She is in no acute distress and nontoxic-appearing sitting comfortably on the chair She has some granulation tissue in the area of the left anterior region of her anal region was with some clear drainage it to be expressed consistent with a likely recurrent anal fistula. I do not see any infection or erythema     Assessment:     Recurrent anal fistula Concern that she is recurrence of her anal fistula. She does have some drainage and on exam has evidence of recurrent anal fissure with. I have recommended that she be evaluated by Dr. Maisie Fus to discuss more definitive options. She will likely require anal fistulotomy.  I have given her some pain medication as well as topical lidocaine her discomfort as she requests.     Plan:     We will set her up for evaluation by Dr. Maisie Fus to discuss additional surgical options

## 2013-04-14 ENCOUNTER — Ambulatory Visit (INDEPENDENT_AMBULATORY_CARE_PROVIDER_SITE_OTHER): Payer: BC Managed Care – PPO | Admitting: General Surgery

## 2013-04-14 ENCOUNTER — Encounter (INDEPENDENT_AMBULATORY_CARE_PROVIDER_SITE_OTHER): Payer: Self-pay | Admitting: General Surgery

## 2013-04-14 VITALS — BP 140/88 | HR 84 | Temp 97.0°F | Resp 16 | Ht 64.0 in | Wt 154.4 lb

## 2013-04-14 DIAGNOSIS — K603 Anal fistula: Secondary | ICD-10-CM

## 2013-04-14 NOTE — Patient Instructions (Signed)
Anal Abscess/Fistula  What is an anal abscess? An anal abscess is an infected cavity filled with pus found near the anus or rectum. What is an anal fistula? An anal fistula (also called fistula-in-ano) is frequently the result of a previous or current anal abscess, occurring in up to 50% of patients with abscesses. Normal anatomy includes small glands just inside the anus. Occasionally, these glands get clogged and potentially can become infected, leading to an abscess. The fistula is a tunnel that forms under the skin and connects the infected glands to the abscess. A fistula can be present with or without an abscess and may connect just to the skin of the buttocks near the anal opening. Other situations that can result in a fistula include Crohn's disease, radiation, trauma and malignancy. How does someone get an anal abscess or a fistula? The abscess is most often a result of an acute infection in the internal glands of the anus. Occasionally, bacteria, fecal material or foreign matter can clog the anal gland and create a condition for an abscess cavity to form. Other medical conditions can make these types of infections more likely.  After an abscess drains on its own or has been drained (opened), a tunnel (fistula) may persist, connecting the infected anal gland to the external skin. This typically will involve some type of drainage from the external opening and occurs in up to 50% of abscesses. If the opening on the skin heals when a fistula is present, a recurrent abscess may develop. What are the specific signs or symptoms of an abscess or fistula? A patient with an abscess may have pain, redness or swelling in the area around the anal area. Fatigue, general malaise, as well as accompanying fever or chills are also common. Similar signs and symptoms may be present when patients have a fistula, with the addition of possible irritation of the perianal skin or drainage from an external opening. Is  any specific testing necessary to diagnose an abscess or fistula? No. Most anal abscesses or fistula-in-ano are diagnosed and managed on the basis of clinical findings. Occasionally, additional studies such as ultrasound, CT scan, or MRI can assist with the diagnosis of deeper abscesses or the delineation of the fistula tunnel to help guide treatment.  What is the treatment of an anal abscess? The treatment of an abscess is surgical drainage under most circumstances. An incision is made in the skin near the anus to drain the infection. This can be done in a doctor's office with local anesthetic or in an operating room under deeper anesthesia. Hospitalization may be required for patients prone to more significant infections such as diabetics or patients with decreased immunity. Are antibiotics required to treat this type of infection? Antibiotics alone are a poor alternative to drainage of the infection. For uncomplicated abscesses, the addition of antibiotics to surgical drainage does not improve healing time or reduce the potential for recurrences. There are some conditions in which antibiotics are indicated, such as for patients with compromised or altered immunity, some cardiac valvular conditions or extensive cellulitis. A comprehensive discussion of your past medical history and a physical exam are important to determine if antibiotics are indicated. What is the treatment of an anal fistula? Surgery is almost always necessary to cure an anal fistula. Although surgery can be fairly straightforward, it may also be complicated, occasionally requiring staged or multiple operations. Consider identifying a specialist in colon and rectal surgery who would be familiar with a number of potential operations   to treat the fistula. The surgery may be performed at the same time as drainage of an abscess, although sometimes the fistula doesn't appear until weeks to years after the initial drainage. If the fistula is  straightforward, a fistulotomy may be performed. This procedure involves connecting the internal opening within the anal canal to the external opening, creating a groove that will heal from the inside out. This surgery often will require dividing a small portion of the sphincter muscle which has the unlikely potential for affecting the control of bowel movements in a limited number of cases.  Other procedures include placing material within the fistula tract to occlude it or surgically altering the surrounding tissue to accomplish closure of the fistula, with the choice of procedure depending upon the type, length, and location of the fistula. Most of the operations can be performed on an outpatient basis, but may occasionally require hospitalization. What is the recovery like from surgery? Pain after surgery is controlled with pain pills, fiber and bulk laxatives. Patients should plan for time at home using sitz baths and attempt to avoid the constipation that can be associated with prescription pain medication. Discuss with your surgeon the specific care and time away from work prior to surgery to prepare yourself for post-operative care. Can the abscess or fistula recur? If adequately treated and properly healed, both are unlikely to return. However, despite proper and indicated open or minimally invasive treatment, both abscesses and fistulas can potentially recur. Should similar symptoms arise, suggesting recurrence, it is recommended that you find a colon and rectal surgeon to manage your condition. What is a colon and rectal surgeon? Colon and rectal surgeons are experts in the surgical and non-surgical treatment of diseases of the colon, rectum and anus. They have completed advanced surgical training in the treatment of these diseases as well as full general surgical training. Board-certified colon and rectal surgeons complete residencies in general surgery and colon and rectal surgery, and pass  intensive examinations conducted by the American Board of Surgery and the American Board of Colon and Rectal Surgery. They are well-versed in the treatment of both benign and malignant diseases of the colon, rectum and anus and are able to perform routine screening examinations and surgically treat conditions if indicated to do so.  author: Michael Buckmire, MD, FACS, FASCRS, on behalf of the ASCRS Public Relations Committee  2012 American Society of Colon & Rectal Surgeons  

## 2013-04-14 NOTE — Progress Notes (Signed)
Chief Complaint  Patient presents with  . Follow-up    eval recurrent fistula    HISTORY: [name] is a 47 y.o. female who presents to the office with anal drainage.  Other symptoms include pain with BM's.  This had been occurring for several weeks.  She is s/p a fistula plug placement with advancement flap in Dec, which recurred in about 5 months.  It is continuous in nature.  Her bowel habits are regular and her bowel movements are soft, with miralax, colace and metamucil.         Past Medical History  Diagnosis Date  . Hyperlipidemia   . Anal fistula 09/2012      Past Surgical History  Procedure Laterality Date  . Irrigation and debridement abscess  07/08/2012    Procedure: MINOR INCISION AND DRAINAGE OF ABSCESS;  Surgeon: Geryl Rankins, MD;  Location: [facility];  Service: Gynecology;  Laterality: N/A;  Vulvar Abscess  . Anal fistulectomy  08/22/2012    Procedure: FISTULECTOMY ANAL;  Surgeon: Lodema Pilot, DO;  Location: [facility];  Service: General;  Laterality: N/A;  rectal examination under anesthesia with seton placement   . Hemorrhoid surgery  04/28/2009    PPH  . Hysteroscopy with novasure  01/10/2007  . Hemorrhoid surgery  01/10/2007    I & D thrombosed hemorrhoid  . Examination under anesthesia  09/17/2012    Procedure: EXAM UNDER ANESTHESIA;  Surgeon: Lodema Pilot, DO;  Location: [facility];  Service: General;  Laterality: N/A;  Rectal exam under anesthesia  . Anal fistulectomy  09/17/2012    Procedure: FISTULECTOMY ANAL;  Surgeon: Lodema Pilot, DO;  Location: [facility];  Service: General;  Laterality: N/A;  possible fistulotomy  . Fistula plug  09/17/2012    Procedure: FISTULA PLUG;  Surgeon: Lodema Pilot, DO;  Location: [facility];  Service: General;  Laterality: N/A;  Possible fistula plug        Current Outpatient Prescriptions  Medication Sig Dispense Refill  . docusate sodium (COLACE) 100 MG capsule Take 1  capsule (100 mg total) by mouth 2 (two) times daily.  60 capsule  3  . ibuprofen (ADVIL,MOTRIN) 200 MG tablet Take 200 mg by mouth every 6 (six) hours as needed. For pain      . levonorgestrel-ethinyl estradiol (QUASENSE) 0.15-0.03 MG tablet Take 1 tablet by mouth daily.      Marland Kitchen lidocaine (XYLOCAINE JELLY) 2 % jelly Apply to affected area daily prn  30 mL  0  . oxyCODONE (ROXICODONE) 5 MG immediate release tablet Take 1 tablet (5 mg total) by mouth every 8 (eight) hours as needed for pain.  40 tablet  0  . polyethylene glycol powder (GLYCOLAX/MIRALAX) powder Take 17 g by mouth 2 (two) times daily.  255 g  0  . simvastatin (ZOCOR) 10 MG tablet Take 10 mg by mouth at bedtime.       No current facility-administered medications for this visit.      Allergies  Allergen Reactions  . Ampicillin Anaphylaxis  . Penicillins Anaphylaxis  . Sulfonamide Derivatives Anaphylaxis  . Hydrocodone-Acetaminophen Hives  . Tylenol (Acetaminophen)   . Tramadol Itching      History reviewed. No pertinent family history.  History   Social History  . Marital Status: Single    Spouse Name: N/A    Number of Children: N/A  . Years of Education: N/A   Social History Main Topics  . Smoking status: Never  Smoker   . Smokeless tobacco: Never Used  . Alcohol Use: No  . Drug Use: No  . Sexually Active: Not Currently    Birth Control/ Protection: Condom   Other Topics Concern  . None   Social History Narrative  . None      REVIEW OF SYSTEMS - PERTINENT POSITIVES ONLY: Review of Systems - General ROS: negative for - chills, fever or weight loss Hematological and Lymphatic ROS: negative for - bleeding problems, blood clots or bruising Respiratory ROS: no cough, shortness of breath, or wheezing Cardiovascular ROS: no chest pain or dyspnea on exertion Gastrointestinal ROS: no abdominal pain, change in bowel habits, or black or bloody stools Genito-Urinary ROS: no dysuria, trouble voiding, or  hematuria  EXAM: Filed Vitals:   04/14/13 1044  BP: 140/88  Pulse: 84  Temp: 97 F (36.1 C)  Resp: 16    General appearance: alert and cooperative Resp: clear to auscultation bilaterally Cardio: regular rate and rhythm GI: soft, non-tender; bowel sounds normal; no masses,  no organomegaly  Anal Exam Findings: fistula appears superficial on digital exam    ASSESSMENT AND PLAN: [name] is a 47 y.o. F with a recurrent anal fistula.  Given her fistula plug failure, I would like to start with an anal EUA.  If the fistula is shallow, then we will perform a fistulotomy.  If it is more complex, then we will place a seton.  We discussed the risks of fistulotomy, which include pain, bleeding and a small chance of incontinence.      Vanita Panda, MD Colon and Rectal Surgery / General Surgery [facility]      Visit Diagnoses: 1. Anal fistula     Primary Care Physician: Geryl Rankins, MD

## 2013-04-24 ENCOUNTER — Other Ambulatory Visit: Payer: Self-pay | Admitting: Obstetrics and Gynecology

## 2013-04-24 DIAGNOSIS — Z1231 Encounter for screening mammogram for malignant neoplasm of breast: Secondary | ICD-10-CM

## 2013-05-06 ENCOUNTER — Telehealth (INDEPENDENT_AMBULATORY_CARE_PROVIDER_SITE_OTHER): Payer: Self-pay | Admitting: *Deleted

## 2013-05-06 ENCOUNTER — Encounter (HOSPITAL_BASED_OUTPATIENT_CLINIC_OR_DEPARTMENT_OTHER): Payer: Self-pay | Admitting: *Deleted

## 2013-05-06 NOTE — Progress Notes (Signed)
NPO AFTER MN. ARRIVES AT 0600. NEEDS HG.  

## 2013-05-06 NOTE — Telephone Encounter (Signed)
Patient called to ask if she will need a bowel prep or enema prior to her surgery.

## 2013-05-06 NOTE — Telephone Encounter (Signed)
No, she does not need to do a prep.

## 2013-05-06 NOTE — Telephone Encounter (Signed)
I called and notified the pt

## 2013-05-12 ENCOUNTER — Ambulatory Visit (HOSPITAL_COMMUNITY)
Admission: RE | Admit: 2013-05-12 | Discharge: 2013-05-12 | Disposition: A | Discharge status: Home / Self Care | Payer: BC Managed Care – PPO | Source: Ambulatory Visit | Attending: Obstetrics and Gynecology | Admitting: Obstetrics and Gynecology

## 2013-05-12 DIAGNOSIS — Z1231 Encounter for screening mammogram for malignant neoplasm of breast: Secondary | ICD-10-CM | POA: Insufficient documentation

## 2013-05-14 ENCOUNTER — Ambulatory Visit (HOSPITAL_BASED_OUTPATIENT_CLINIC_OR_DEPARTMENT_OTHER): Payer: BC Managed Care – PPO | Admitting: Anesthesiology

## 2013-05-14 ENCOUNTER — Encounter (HOSPITAL_BASED_OUTPATIENT_CLINIC_OR_DEPARTMENT_OTHER): Payer: Self-pay | Admitting: Anesthesiology

## 2013-05-14 ENCOUNTER — Ambulatory Visit (HOSPITAL_BASED_OUTPATIENT_CLINIC_OR_DEPARTMENT_OTHER)
Admission: RE | Admit: 2013-05-14 | Discharge: 2013-05-14 | Disposition: A | Discharge status: Home / Self Care | Payer: BC Managed Care – PPO | Source: Ambulatory Visit | Attending: General Surgery | Admitting: General Surgery

## 2013-05-14 ENCOUNTER — Encounter (HOSPITAL_BASED_OUTPATIENT_CLINIC_OR_DEPARTMENT_OTHER)
Admission: RE | Disposition: A | Discharge status: Home / Self Care | Payer: Self-pay | Source: Ambulatory Visit | Attending: General Surgery

## 2013-05-14 ENCOUNTER — Encounter (HOSPITAL_BASED_OUTPATIENT_CLINIC_OR_DEPARTMENT_OTHER): Payer: Self-pay | Admitting: *Deleted

## 2013-05-14 DIAGNOSIS — K603 Anal fistula, unspecified: Secondary | ICD-10-CM | POA: Insufficient documentation

## 2013-05-14 DIAGNOSIS — E785 Hyperlipidemia, unspecified: Secondary | ICD-10-CM | POA: Insufficient documentation

## 2013-05-14 DIAGNOSIS — Z79899 Other long term (current) drug therapy: Secondary | ICD-10-CM | POA: Insufficient documentation

## 2013-05-14 HISTORY — DX: Constipation, unspecified: K59.00

## 2013-05-14 HISTORY — PX: PLACEMENT OF SETON: SHX6029

## 2013-05-14 HISTORY — PX: EXAMINATION UNDER ANESTHESIA: SHX1540

## 2013-05-14 SURGERY — EXAM UNDER ANESTHESIA
Anesthesia: General | Site: Rectum | Wound class: Clean Contaminated

## 2013-05-14 MED ORDER — EPHEDRINE SULFATE 50 MG/ML IJ SOLN
INTRAMUSCULAR | Status: DC | PRN
  Administered 2013-05-14: 10 mg via INTRAVENOUS

## 2013-05-14 MED ORDER — BUPIVACAINE-EPINEPHRINE 0.25% -1:200000 IJ SOLN
INTRAMUSCULAR | Status: DC | PRN
  Administered 2013-05-14: 17 mL

## 2013-05-14 MED ORDER — ACETIC ACID 5 % SOLN
Status: DC | PRN
  Administered 2013-05-14: 1 via TOPICAL

## 2013-05-14 MED ORDER — LIDOCAINE 5 % EX OINT
TOPICAL_OINTMENT | CUTANEOUS | Status: DC | PRN
  Administered 2013-05-14: 1

## 2013-05-14 MED ORDER — LIDOCAINE HCL (CARDIAC) 20 MG/ML IV SOLN
INTRAVENOUS | Status: DC | PRN
  Administered 2013-05-14: 80 mg via INTRAVENOUS

## 2013-05-14 MED ORDER — SUCCINYLCHOLINE CHLORIDE 20 MG/ML IJ SOLN
INTRAMUSCULAR | Status: DC | PRN
  Administered 2013-05-14: 120 mg via INTRAVENOUS

## 2013-05-14 MED ORDER — FENTANYL CITRATE 0.05 MG/ML IJ SOLN
25.0000 ug | INTRAMUSCULAR | Status: DC | PRN
  Filled 2013-05-14: qty 1

## 2013-05-14 MED ORDER — PROPOFOL 10 MG/ML IV BOLUS
INTRAVENOUS | Status: DC | PRN
  Administered 2013-05-14: 200 mg via INTRAVENOUS

## 2013-05-14 MED ORDER — ONDANSETRON HCL 4 MG/2ML IJ SOLN
4.0000 mg | Freq: Four times a day (QID) | INTRAMUSCULAR | Status: DC | PRN
  Filled 2013-05-14: qty 2

## 2013-05-14 MED ORDER — SODIUM CHLORIDE 0.9 % IJ SOLN
3.0000 mL | Freq: Two times a day (BID) | INTRAMUSCULAR | Status: DC
  Filled 2013-05-14: qty 3

## 2013-05-14 MED ORDER — FENTANYL CITRATE 0.05 MG/ML IJ SOLN
INTRAMUSCULAR | Status: DC | PRN
  Administered 2013-05-14 (×2): 50 ug via INTRAVENOUS

## 2013-05-14 MED ORDER — MEPERIDINE HCL 25 MG/ML IJ SOLN
6.2500 mg | INTRAMUSCULAR | Status: DC | PRN
  Filled 2013-05-14: qty 1

## 2013-05-14 MED ORDER — SODIUM CHLORIDE 0.9 % IJ SOLN
3.0000 mL | INTRAMUSCULAR | Status: DC | PRN
  Filled 2013-05-14: qty 3

## 2013-05-14 MED ORDER — LACTATED RINGERS IV SOLN
INTRAVENOUS | Status: DC
  Administered 2013-05-14 (×2): via INTRAVENOUS
  Filled 2013-05-14: qty 1000

## 2013-05-14 MED ORDER — LIDOCAINE HCL 4 % MT SOLN
OROMUCOSAL | Status: DC | PRN
  Administered 2013-05-14: 4 mL via TOPICAL

## 2013-05-14 MED ORDER — MIDAZOLAM HCL 5 MG/5ML IJ SOLN
INTRAMUSCULAR | Status: DC | PRN
  Administered 2013-05-14: 2 mg via INTRAVENOUS

## 2013-05-14 MED ORDER — SODIUM CHLORIDE 0.9 % IV SOLN
250.0000 mL | INTRAVENOUS | Status: DC | PRN
  Filled 2013-05-14: qty 250

## 2013-05-14 MED ORDER — DEXAMETHASONE SODIUM PHOSPHATE 4 MG/ML IJ SOLN
INTRAMUSCULAR | Status: DC | PRN
  Administered 2013-05-14: 10 mg via INTRAVENOUS

## 2013-05-14 MED ORDER — LACTATED RINGERS IV SOLN
INTRAVENOUS | Status: DC
  Filled 2013-05-14: qty 1000

## 2013-05-14 MED ORDER — GLYCOPYRROLATE 0.2 MG/ML IJ SOLN
INTRAMUSCULAR | Status: DC | PRN
  Administered 2013-05-14: 0.2 mg via INTRAVENOUS

## 2013-05-14 MED ORDER — ONDANSETRON HCL 4 MG/2ML IJ SOLN
INTRAMUSCULAR | Status: DC | PRN
  Administered 2013-05-14: 4 mg via INTRAVENOUS

## 2013-05-14 MED ORDER — OXYCODONE HCL 5 MG PO TABS
5.0000 mg | ORAL_TABLET | ORAL | Status: DC | PRN
  Filled 2013-05-14: qty 2

## 2013-05-14 MED ORDER — OXYCODONE HCL 5 MG PO TABS: 5.0000 mg | ORAL_TABLET | Freq: Four times a day (QID) | ORAL | Status: DC | PRN

## 2013-05-14 MED ORDER — PROMETHAZINE HCL 25 MG/ML IJ SOLN
6.2500 mg | INTRAMUSCULAR | Status: DC | PRN
  Filled 2013-05-14: qty 1

## 2013-05-14 SURGICAL SUPPLY — 58 items
APL SKNCLS STERI-STRIP NONHPOA (GAUZE/BANDAGES/DRESSINGS) ×1
BANDAGE GAUZE ELAST BULKY 4 IN (GAUZE/BANDAGES/DRESSINGS) ×1 IMPLANT
BENZOIN TINCTURE PRP APPL 2/3 (GAUZE/BANDAGES/DRESSINGS) ×2 IMPLANT
BLADE HEX COATED 2.75 (ELECTRODE) ×2 IMPLANT
BLADE SURG 10 STRL SS (BLADE) IMPLANT
BLADE SURG 15 STRL LF DISP TIS (BLADE) ×1 IMPLANT
BLADE SURG 15 STRL SS (BLADE) ×2
BRIEF STRETCH FOR OB PAD LRG (UNDERPADS AND DIAPERS) ×2 IMPLANT
CANISTER SUCTION 2500CC (MISCELLANEOUS) ×2 IMPLANT
CLOTH BEACON ORANGE TIMEOUT ST (SAFETY) ×2 IMPLANT
COVER MAYO STAND STRL (DRAPES) ×2 IMPLANT
COVER TABLE BACK 60X90 (DRAPES) ×2 IMPLANT
DECANTER SPIKE VIAL GLASS SM (MISCELLANEOUS) ×1 IMPLANT
DRAPE LG THREE QUARTER DISP (DRAPES) ×4 IMPLANT
DRAPE PED LAPAROTOMY (DRAPES) ×2 IMPLANT
DRAPE UNDERBUTTOCKS STRL (DRAPE) IMPLANT
DRSG PAD ABDOMINAL 8X10 ST (GAUZE/BANDAGES/DRESSINGS) ×1 IMPLANT
ELECT BLADE 6.5 .24CM SHAFT (ELECTRODE) IMPLANT
ELECT REM PT RETURN 9FT ADLT (ELECTROSURGICAL) ×2
ELECTRODE REM PT RTRN 9FT ADLT (ELECTROSURGICAL) ×1 IMPLANT
GAUZE SPONGE 4X4 16PLY XRAY LF (GAUZE/BANDAGES/DRESSINGS) ×2 IMPLANT
GAUZE VASELINE 3X9 (GAUZE/BANDAGES/DRESSINGS) IMPLANT
GLOVE BIO SURGEON STRL SZ 6.5 (GLOVE) ×4 IMPLANT
GLOVE BIOGEL PI IND STRL 7.0 (GLOVE) ×2 IMPLANT
GLOVE BIOGEL PI INDICATOR 7.0 (GLOVE) ×2
GLOVE INDICATOR 7.0 STRL GRN (GLOVE) ×4 IMPLANT
GOWN PREVENTION PLUS LG XLONG (DISPOSABLE) ×1 IMPLANT
GOWN PREVENTION PLUS XLARGE (GOWN DISPOSABLE) ×1 IMPLANT
GOWN STRL REIN XL XLG (GOWN DISPOSABLE) ×5 IMPLANT
HEMOSTAT SURGICEL 2X14 (HEMOSTASIS) ×2 IMPLANT
LEGGING LITHOTOMY PAIR STRL (DRAPES) IMPLANT
LOOP VESSEL MAXI BLUE (MISCELLANEOUS) IMPLANT
LOOP VESSEL MINI RED (MISCELLANEOUS) ×1 IMPLANT
NDL HYPO 25X1 1.5 SAFETY (NEEDLE) ×1 IMPLANT
NDL SAFETY ECLIPSE 18X1.5 (NEEDLE) ×1 IMPLANT
NEEDLE HYPO 18GX1.5 SHARP (NEEDLE)
NEEDLE HYPO 22GX1.5 SAFETY (NEEDLE) ×2 IMPLANT
NEEDLE HYPO 25X1 1.5 SAFETY (NEEDLE) ×2 IMPLANT
NS IRRIG 500ML POUR BTL (IV SOLUTION) ×2 IMPLANT
PACK BASIN DAY SURGERY FS (CUSTOM PROCEDURE TRAY) ×2 IMPLANT
PENCIL BUTTON HOLSTER BLD 10FT (ELECTRODE) ×2 IMPLANT
SPONGE GAUZE 4X4 12PLY (GAUZE/BANDAGES/DRESSINGS) IMPLANT
SPONGE SURGIFOAM ABS GEL 12-7 (HEMOSTASIS) IMPLANT
SUT CHROMIC 2 0 SH (SUTURE) ×2 IMPLANT
SUT CHROMIC 3 0 SH 27 (SUTURE) IMPLANT
SUT ETHIBOND 0 (SUTURE) ×1 IMPLANT
SUT ETHILON 2 0 PS N (SUTURE) ×1 IMPLANT
SUT MON AB 3-0 SH 27 (SUTURE)
SUT MON AB 3-0 SH27 (SUTURE) ×1 IMPLANT
SUT SILK 2 0 (SUTURE) ×2
SUT SILK 2-0 18XBRD TIE 12 (SUTURE) IMPLANT
SUT VIC AB 4-0 P-3 18XBRD (SUTURE) IMPLANT
SUT VIC AB 4-0 P3 18 (SUTURE)
SYR CONTROL 10ML LL (SYRINGE) ×2 IMPLANT
TOWEL OR 17X24 6PK STRL BLUE (TOWEL DISPOSABLE) ×4 IMPLANT
TRAY DSU PREP LF (CUSTOM PROCEDURE TRAY) ×2 IMPLANT
TUBE CONNECTING 12X1/4 (SUCTIONS) ×2 IMPLANT
YANKAUER SUCT BULB TIP NO VENT (SUCTIONS) ×2 IMPLANT

## 2013-05-14 NOTE — H&P (View-Only) (Signed)
Chief Complaint  Patient presents with  . Follow-up    eval recurrent fistula    HISTORY: [name] is a 47 y.o. female who presents to the office with anal drainage.  Other symptoms include pain with BM's.  This had been occurring for several weeks.  She is s/p a fistula plug placement with advancement flap in Dec, which recurred in about 5 months.  It is continuous in nature.  Her bowel habits are regular and her bowel movements are soft, with miralax, colace and metamucil.         Past Medical History  Diagnosis Date  . Hyperlipidemia   . Anal fistula 09/2012      Past Surgical History  Procedure Laterality Date  . Irrigation and debridement abscess  07/08/2012    Procedure: MINOR INCISION AND DRAINAGE OF ABSCESS;  Surgeon: Geryl Rankins, MD;  Location: [facility];  Service: Gynecology;  Laterality: N/A;  Vulvar Abscess  . Anal fistulectomy  08/22/2012    Procedure: FISTULECTOMY ANAL;  Surgeon: Lodema Pilot, DO;  Location: [facility];  Service: General;  Laterality: N/A;  rectal examination under anesthesia with seton placement   . Hemorrhoid surgery  04/28/2009    PPH  . Hysteroscopy with novasure  01/10/2007  . Hemorrhoid surgery  01/10/2007    I & D thrombosed hemorrhoid  . Examination under anesthesia  09/17/2012    Procedure: EXAM UNDER ANESTHESIA;  Surgeon: Lodema Pilot, DO;  Location: [facility];  Service: General;  Laterality: N/A;  Rectal exam under anesthesia  . Anal fistulectomy  09/17/2012    Procedure: FISTULECTOMY ANAL;  Surgeon: Lodema Pilot, DO;  Location: [facility];  Service: General;  Laterality: N/A;  possible fistulotomy  . Fistula plug  09/17/2012    Procedure: FISTULA PLUG;  Surgeon: Lodema Pilot, DO;  Location: [facility];  Service: General;  Laterality: N/A;  Possible fistula plug        Current Outpatient Prescriptions  Medication Sig Dispense Refill  . docusate sodium (COLACE) 100 MG capsule Take 1  capsule (100 mg total) by mouth 2 (two) times daily.  60 capsule  3  . ibuprofen (ADVIL,MOTRIN) 200 MG tablet Take 200 mg by mouth every 6 (six) hours as needed. For pain      . levonorgestrel-ethinyl estradiol (QUASENSE) 0.15-0.03 MG tablet Take 1 tablet by mouth daily.      Marland Kitchen lidocaine (XYLOCAINE JELLY) 2 % jelly Apply to affected area daily prn  30 mL  0  . oxyCODONE (ROXICODONE) 5 MG immediate release tablet Take 1 tablet (5 mg total) by mouth every 8 (eight) hours as needed for pain.  40 tablet  0  . polyethylene glycol powder (GLYCOLAX/MIRALAX) powder Take 17 g by mouth 2 (two) times daily.  255 g  0  . simvastatin (ZOCOR) 10 MG tablet Take 10 mg by mouth at bedtime.       No current facility-administered medications for this visit.      Allergies  Allergen Reactions  . Ampicillin Anaphylaxis  . Penicillins Anaphylaxis  . Sulfonamide Derivatives Anaphylaxis  . Hydrocodone-Acetaminophen Hives  . Tylenol (Acetaminophen)   . Tramadol Itching      History reviewed. No pertinent family history.  History   Social History  . Marital Status: Single    Spouse Name: N/A    Number of Children: N/A  . Years of Education: N/A   Social History Main Topics  . Smoking status: Never  Smoker   . Smokeless tobacco: Never Used  . Alcohol Use: No  . Drug Use: No  . Sexually Active: Not Currently    Birth Control/ Protection: Condom   Other Topics Concern  . None   Social History Narrative  . None      REVIEW OF SYSTEMS - PERTINENT POSITIVES ONLY: Review of Systems - General ROS: negative for - chills, fever or weight loss Hematological and Lymphatic ROS: negative for - bleeding problems, blood clots or bruising Respiratory ROS: no cough, shortness of breath, or wheezing Cardiovascular ROS: no chest pain or dyspnea on exertion Gastrointestinal ROS: no abdominal pain, change in bowel habits, or black or bloody stools Genito-Urinary ROS: no dysuria, trouble voiding, or  hematuria  EXAM: Filed Vitals:   04/14/13 1044  BP: 140/88  Pulse: 84  Temp: 97 F (36.1 C)  Resp: 16    General appearance: alert and cooperative Resp: clear to auscultation bilaterally Cardio: regular rate and rhythm GI: soft, non-tender; bowel sounds normal; no masses,  no organomegaly  Anal Exam Findings: fistula appears superficial on digital exam    ASSESSMENT AND PLAN: [name] is a 47 y.o. F with a recurrent anal fistula.  Given her fistula plug failure, I would like to start with an anal EUA.  If the fistula is shallow, then we will perform a fistulotomy.  If it is more complex, then we will place a seton.  We discussed the risks of fistulotomy, which include pain, bleeding and a small chance of incontinence.      Diane Panda, MD Colon and Rectal Surgery / General Surgery [facility]      Visit Diagnoses: 1. Anal fistula     Primary Care Physician: Geryl Rankins, MD

## 2013-05-14 NOTE — Interval H&P Note (Signed)
History and Physical Interval Note:  05/14/2013 7:27 AM  [name]  has presented today for surgery, with the diagnosis of anal fistula  The various methods of treatment have been discussed with the patient and family. After consideration of risks, benefits and other options for treatment, the patient has consented to  Procedure(s): EXAM UNDER ANESTHESIA (N/A) POSSIBLE FISTULOTOMY (N/A) POSSIBLE PLACEMENT OF SETON (N/A) as a surgical intervention .  The patient's history has been reviewed, patient examined, no change in status, stable for surgery.  I have reviewed the patient's chart and labs.  Questions were answered to the patient's satisfaction.     Vanita Panda, MD  Colorectal and General Surgery [facility]

## 2013-05-14 NOTE — Anesthesia Procedure Notes (Signed)
Procedure Name: Intubation Performed by: Norva Pavlov Pre-anesthesia Checklist: Patient identified, Emergency Drugs available, Suction available and Patient being monitored Patient Re-evaluated:Patient Re-evaluated prior to inductionOxygen Delivery Method: Circle System Utilized Preoxygenation: Pre-oxygenation with 100% oxygen Intubation Type: IV induction Ventilation: Mask ventilation without difficulty Laryngoscope Size: Mac and 3 Tube type: Oral Tube size: 7.0 mm Number of attempts: 1 Airway Equipment and Method: stylet and LTA kit utilized Placement Confirmation: ETT inserted through vocal cords under direct vision,  positive ETCO2 and breath sounds checked- equal and bilateral Tube secured with: Tape Dental Injury: Teeth and Oropharynx as per pre-operative assessment

## 2013-05-14 NOTE — Anesthesia Preprocedure Evaluation (Signed)
Anesthesia Evaluation  Patient identified by MRN, date of birth, ID band Patient awake    Reviewed: Allergy & Precautions, H&P , NPO status , Patient's Chart, lab work & pertinent test results  Airway Mallampati: II TM Distance: >3 FB Neck ROM: Full    Dental no notable dental hx. (+) Teeth Intact and Dental Advisory Given   Pulmonary neg pulmonary ROS,  breath sounds clear to auscultation  Pulmonary exam normal       Cardiovascular negative cardio ROS  Rhythm:Regular Rate:Normal     Neuro/Psych negative neurological ROS  negative psych ROS   GI/Hepatic negative GI ROS, Neg liver ROS,   Endo/Other  negative endocrine ROS  Renal/GU negative Renal ROS  negative genitourinary   Musculoskeletal   Abdominal   Peds  Hematology negative hematology ROS (+)   Anesthesia Other Findings   Reproductive/Obstetrics negative OB ROS                           Anesthesia Physical  Anesthesia Plan  ASA: I  Anesthesia Plan: General   Post-op Pain Management:    Induction: Intravenous  Airway Management Planned: Oral ETT  Additional Equipment:   Intra-op Plan:   Post-operative Plan: Extubation in OR  Informed Consent: I have reviewed the patients History and Physical, chart, labs and discussed the procedure including the risks, benefits and alternatives for the proposed anesthesia with the patient or authorized representative who has indicated his/her understanding and acceptance.   Dental advisory given  Plan Discussed with: CRNA  Anesthesia Plan Comments:         Anesthesia Quick Evaluation                                  Anesthesia Evaluation  Patient identified by MRN, date of birth, ID band Patient awake    Reviewed: Allergy & Precautions, H&P , NPO status , Patient's Chart, lab work & pertinent test results  Airway Mallampati: II TM Distance: >3 FB Neck ROM: Full    Dental No notable dental hx. (+) Teeth Intact and Dental Advisory Given   Pulmonary neg pulmonary ROS,  breath sounds clear to auscultation  Pulmonary exam normal       Cardiovascular negative cardio ROS  Rhythm:Regular Rate:Normal     Neuro/Psych negative neurological ROS  negative psych ROS   GI/Hepatic negative GI ROS, Neg liver ROS,   Endo/Other  negative endocrine ROS  Renal/GU negative Renal ROS  negative genitourinary   Musculoskeletal   Abdominal   Peds  Hematology negative hematology ROS (+)   Anesthesia Other Findings   Reproductive/Obstetrics negative OB ROS                           Anesthesia Physical Anesthesia Plan  ASA: I  Anesthesia Plan: General   Post-op Pain Management:    Induction: Intravenous  Airway Management Planned: Oral ETT  Additional Equipment:   Intra-op Plan:   Post-operative Plan: Extubation in OR  Informed Consent: I have reviewed the patients History and Physical, chart, labs and discussed the procedure including the risks, benefits and alternatives for the proposed anesthesia with the patient or authorized representative who has indicated his/her understanding and acceptance.   Dental advisory given  Plan Discussed with: CRNA  Anesthesia Plan Comments:         Anesthesia Quick Evaluation

## 2013-05-14 NOTE — Anesthesia Postprocedure Evaluation (Signed)
  Anesthesia Post-op Note  Patient: [name]  Procedure(s) Performed: Procedure(s) (LRB): EXAM UNDER ANESTHESIA (N/A) POSSIBLE FISTULOTOMY (N/A)  PLACEMENT OF SETON (N/A)  Patient Location: PACU  Anesthesia Type: General  Level of Consciousness: awake and alert   Airway and Oxygen Therapy: Patient Spontanous Breathing  Post-op Pain: mild  Post-op Assessment: Post-op Vital signs reviewed, Patient's Cardiovascular Status Stable, Respiratory Function Stable, Patent Airway and No signs of Nausea or vomiting  Last Vitals:  Filed Vitals:   05/14/13 0945  BP: 138/91  Pulse: 81  Temp: 36.3 C  Resp: 14    Post-op Vital Signs: stable   Complications: No apparent anesthesia complications

## 2013-05-14 NOTE — Op Note (Signed)
05/14/2013  8:07 AM  PATIENT:  [name]  47 y.o. female  Patient Care Team: Geryl Rankins, MD as PCP - General (Obstetrics and Gynecology)  PRE-OPERATIVE DIAGNOSIS:  anal fistula  POST-OPERATIVE DIAGNOSIS:  Anal fistula- L anterior (recurrent)  PROCEDURE:   EXAM UNDER ANESTHESIA PLACEMENT OF SETON  SURGEON:  Surgeon(s): Romie Levee, MD  ASSISTANT: none   ANESTHESIA:   general  EBL: min  Total I/O In: 1000 [I.V.:1000] Out: -   Delay start of Pharmacological VTE agent (>24hrs) due to surgical blood loss or risk of bleeding:  yes  DRAINS: none   SPECIMEN:  No Specimen  DISPOSITION OF SPECIMEN:  PATHOLOGY  COUNTS:  YES  PLAN OF CARE: Discharge to home after PACU  PATIENT DISPOSITION:  PACU - hemodynamically stable.  INDICATION: This is a 47 y.o. F who underwent fistula plug placement earlier this year.  She developed a recurrence and was sent to me for evaluation.  We are here today for evaluation and possible seton placement.  OR FINDINGS: L anterior complex fistula with significant scarring, large internal opening.  Thinning of perineal body anteriorly.    DESCRIPTION: The patient was identified in the preoperative holding area and taken to the OR where they were laid prone on the operating room table, after general anesthesia was smoothly induced.  The patient was then prepped and draped in the usual sterile fashion. A surgical timeout was performed indicating the correct patient, procedure, positioning and need for preoperative antibiotics. SCDs were noted to be in place and functioning prior to the operation.  I began with a digital exam.  The internal opening was easily palpated and rather large.  I palpated a nodule in the L lateral proximal anal canal.  I performed a rectal block using 0.25% Marcaine with epinephrine.  I then placed a Hill Ferguson anoscope into the canal and performed a complete anal exam.  She was noted to have a shallow fissure anteriorly.   The nodule palpated appeared to be scar and did not look abnormal on anoscopy.  I placed an S shaped fistula probe into the fistula.  This passed easily into the internal opening.  This appeared to encompass the entire external sphincter.   A suture was passed through the fistula tract and a seton was pulled back through.  This was secured in a loop using Ethibond suture.  Lidocaine ointment was applied, and a sterile dressing was placed.  The patient was awakened from anesthesia and sent to the PACU in stable condition.  All counts were correct per OR staff.    Future planning: I would like to leave the seton in place for at least 2 months.  I think she would do well with a LIFT procedure, once her scarring and inflammation have softened.  I am concerned that she may have an anterior sphincter defect, which is making this area hard to heal.  Ultimately she may need an anal [place] to evaluate this and a sphincter repair, if she does indeed have a defect, to get rid of her fistula defect.  I will discuss all of her options with her in detail in her subsequent office visits.

## 2013-05-14 NOTE — Transfer of Care (Signed)
Immediate Anesthesia Transfer of Care Note  Patient: [name]  Procedure(s) Performed: Procedure(s) (LRB): EXAM UNDER ANESTHESIA (N/A) POSSIBLE FISTULOTOMY (N/A)  PLACEMENT OF SETON (N/A)  Patient Location: PACU  Anesthesia Type: General  Level of Consciousness: awake, alert  and oriented  Airway & Oxygen Therapy: Patient Spontanous Breathing and Patient connected to face mask oxygen  Post-op Assessment: Report given to PACU RN and Post -op Vital signs reviewed and stable  Post vital signs: Reviewed and stable  Complications: No apparent anesthesia complications

## 2013-05-15 ENCOUNTER — Encounter (HOSPITAL_BASED_OUTPATIENT_CLINIC_OR_DEPARTMENT_OTHER): Payer: Self-pay | Admitting: General Surgery

## 2013-05-15 ENCOUNTER — Telehealth (INDEPENDENT_AMBULATORY_CARE_PROVIDER_SITE_OTHER): Payer: Self-pay | Admitting: *Deleted

## 2013-05-15 NOTE — Telephone Encounter (Signed)
Patient called to report some chills but no fevers.  Explained to patient that bc her surgery was just yesterday this symptom could be still from the anesthesia.  Encouraged patient to drink lots of fluids throughout today and give [place] a call back this afternoon if symptoms don't start relieving.  Patient states understanding and agreeable at this time.

## 2013-05-19 LAB — POCT HEMOGLOBIN-HEMACUE: Hemoglobin: 14 g/dL (ref 12.0–15.0)

## 2013-05-26 ENCOUNTER — Telehealth (INDEPENDENT_AMBULATORY_CARE_PROVIDER_SITE_OTHER): Payer: Self-pay

## 2013-05-26 NOTE — Telephone Encounter (Signed)
Pt called stating she is having rx request for miralax faxed to Dr Maisie Fus today for a refill. I advised pt once we have request we will send it to Dr Maisie Fus for review.

## 2013-06-03 ENCOUNTER — Ambulatory Visit (INDEPENDENT_AMBULATORY_CARE_PROVIDER_SITE_OTHER): Payer: BC Managed Care – PPO | Admitting: General Surgery

## 2013-06-03 ENCOUNTER — Encounter (INDEPENDENT_AMBULATORY_CARE_PROVIDER_SITE_OTHER): Payer: Self-pay

## 2013-06-03 VITALS — BP 130/86 | HR 80 | Resp 16 | Ht 64.0 in | Wt 153.0 lb

## 2013-06-03 DIAGNOSIS — K603 Anal fistula: Secondary | ICD-10-CM

## 2013-06-03 MED ORDER — DOCUSATE SODIUM 100 MG PO CAPS: 100.0000 mg | ORAL_CAPSULE | Freq: Two times a day (BID) | ORAL | Status: DC

## 2013-06-03 MED ORDER — OXYCODONE HCL 5 MG PO TABS: 5.0000 mg | ORAL_TABLET | Freq: Four times a day (QID) | ORAL | Status: DC | PRN

## 2013-06-03 NOTE — Patient Instructions (Signed)
Continue to use Sitz baths after bowel movements and times when you are having pain.  Ok to use lidocaine ointment for pain as well.  Return to the office in 1 month.  OK to return to work.

## 2013-06-03 NOTE — Progress Notes (Signed)
[name] is a 47 y.o. female who is status post a seton on 7/10.  She is still having some pain and drainage.  She is using her Sitz baths daily.    Objective: Filed Vitals:   06/03/13 1509  BP: 130/86  Pulse: 80  Resp: 16    General appearance: alert and cooperative  Incision: seton in place   Assessment: s/p  Patient Active Problem List   Diagnosis Date Noted  . Anal fistula 04/14/2013  . UNSPECIFIED SITE OF SPRAIN AND STRAIN 07/07/2009  . PEDAL EDEMA 06/29/2009  . MUSCLE PAIN 06/23/2009  . 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 ALLERGIC RHINITIS 03/11/2009  . SYNCOPE, VASOVAGAL 03/11/2009  . HEADACHE 03/11/2009    Plan: She is doing well.  I will see her back in 1 month.  Hopefully her pain will have subsided enough for Korea to do an Korea to evaluate her sphincters.      Vanita Panda, MD [facility], [place] [phone]   06/03/2013 3:22 PM

## 2013-07-08 ENCOUNTER — Ambulatory Visit (INDEPENDENT_AMBULATORY_CARE_PROVIDER_SITE_OTHER): Payer: BC Managed Care – PPO | Admitting: General Surgery

## 2013-07-08 ENCOUNTER — Encounter (INDEPENDENT_AMBULATORY_CARE_PROVIDER_SITE_OTHER): Payer: Self-pay | Admitting: General Surgery

## 2013-07-08 VITALS — BP 140/82 | HR 78 | Resp 16 | Ht 64.0 in | Wt 151.4 lb

## 2013-07-08 DIAGNOSIS — K603 Anal fistula: Secondary | ICD-10-CM

## 2013-07-08 NOTE — Patient Instructions (Signed)
We will perform an anal ultrasound to evaluate your sphincter muscles before surgery

## 2013-07-08 NOTE — Progress Notes (Signed)
[name] is a 47 y.o. female who is status post a seton on 7/10 of an anterior anal fistula. She is still having some itching and drainage. She is using her Sitz as needed Objective:  Filed Vitals:   07/08/13 1424  BP: 140/82  Pulse: 78  Resp: 16   General appearance: alert and cooperative  Incision: seton in place, no tissue sepsis noted Assessment:  s/p  Patient Active Problem List    Diagnosis  Date Noted   .  Anal fistula  04/14/2013   .  UNSPECIFIED SITE OF SPRAIN AND STRAIN  07/07/2009   .  PEDAL EDEMA  06/29/2009   .  MUSCLE PAIN  06/23/2009   .  ANEMIA-IRON DEFICIENCY  03/11/2009   .  ALLERGIC RHINITIS  03/11/2009   .  SYNCOPE, VASOVAGAL  03/11/2009   .  HEADACHE  03/11/2009   Plan:  She is doing well.  We will schedule an Korea to evaluate her sphincters and then plan surgery from there.  Vanita Panda, MD  [facility], [place]  [phone]

## 2013-07-20 ENCOUNTER — Other Ambulatory Visit (INDEPENDENT_AMBULATORY_CARE_PROVIDER_SITE_OTHER): Payer: Self-pay | Admitting: General Surgery

## 2013-07-20 ENCOUNTER — Encounter (INDEPENDENT_AMBULATORY_CARE_PROVIDER_SITE_OTHER): Payer: Self-pay

## 2013-07-20 ENCOUNTER — Encounter (HOSPITAL_COMMUNITY): Payer: Self-pay

## 2013-07-20 ENCOUNTER — Encounter (HOSPITAL_COMMUNITY)
Admission: RE | Disposition: A | Discharge status: Home / Self Care | Payer: Self-pay | Source: Ambulatory Visit | Attending: General Surgery

## 2013-07-20 ENCOUNTER — Ambulatory Visit (HOSPITAL_COMMUNITY)
Admission: RE | Admit: 2013-07-20 | Discharge: 2013-07-20 | Disposition: A | Discharge status: Home / Self Care | Payer: BC Managed Care – PPO | Source: Ambulatory Visit | Attending: General Surgery | Admitting: General Surgery

## 2013-07-20 DIAGNOSIS — E785 Hyperlipidemia, unspecified: Secondary | ICD-10-CM | POA: Insufficient documentation

## 2013-07-20 DIAGNOSIS — K5900 Constipation, unspecified: Secondary | ICD-10-CM | POA: Insufficient documentation

## 2013-07-20 DIAGNOSIS — K603 Anal fistula, unspecified: Secondary | ICD-10-CM | POA: Insufficient documentation

## 2013-07-20 HISTORY — PX: RECTAL ULTRASOUND: SHX2306

## 2013-07-20 SURGERY — US RECTUM
Wound class: Clean Contaminated

## 2013-07-20 MED ORDER — MIDAZOLAM HCL 10 MG/2ML IJ SOLN
INTRAMUSCULAR | Status: AC
  Filled 2013-07-20: qty 2

## 2013-07-20 MED ORDER — SODIUM CHLORIDE 0.9 % IV SOLN
INTRAVENOUS | Status: DC
  Administered 2013-07-20: 500 mL via INTRAVENOUS

## 2013-07-20 MED ORDER — FENTANYL CITRATE 0.05 MG/ML IJ SOLN
INTRAMUSCULAR | Status: AC
  Filled 2013-07-20: qty 2

## 2013-07-20 NOTE — Op Note (Signed)
07/20/2013  8:10 AM  PATIENT:  [name]  47 y.o. female  Patient Care Team: Geryl Rankins, MD as PCP - General (Obstetrics and Gynecology)  PRE-OPERATIVE DIAGNOSIS:  anal fistula  POST-OPERATIVE DIAGNOSIS: same  PROCEDURE:  Anal ultrasound  SURGEON:  Surgeon(s): Romie Levee, MD  ANESTHESIA:   none  EBL: none    INDICATION: This is a 47 y.o. F with a recurrent anterior anal fistula and a thin perineal body.  OR FINDINGS: sphincters seem to be intact but internal is somewhat thin anteriorly   DESCRIPTION: The patient was identified in the preoperative holding area and taken to the procedure room where they were laid in decubitus position on the exam room table.  A timeout was performed indicating the correct patient and procedure. I began by performing a digital rectal exam.  There were no abnormal masses.  The fistula seton was in place.  The US probe was inserted without difficulty.  The levators were identified and the probe was withdrawn slowly.  The internal sphincter was identified and followed distally.  It did seem to thin out antertiorly, but not defect was identified.  The external sphincter was also identified and followed distally.  It was difficult to capture a complete photo anteriorly due to shadowing, but it did appear to be intact as well.  The fistula was identified in the left anterior position.  There was a a small associated fluid pocket noted as well within the intersphincteric space.

## 2013-07-20 NOTE — H&P (Signed)
[name] is a 47 y.o. F with a recurrent anterior perianal fistula.    Past Medical History  Diagnosis Date  . Hyperlipidemia   . 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 Constipation    Past Surgical History  Procedure Laterality Date  . Irrigation and debridement abscess  07/08/2012    Procedure: MINOR INCISION AND DRAINAGE OF ABSCESS;  Surgeon: Geryl Rankins, MD;  Location: [facility];  Service: Gynecology;  Laterality: N/A;  Vulvar Abscess  . Anal fistulectomy  08/22/2012    Procedure: FISTULECTOMY ANAL;  Surgeon: Lodema Pilot, DO;  Location: [facility];  Service: General;  Laterality: N/A;  rectal examination under anesthesia with seton placement   . Examination under anesthesia  09/17/2012    Procedure: EXAM UNDER ANESTHESIA;  Surgeon: Lodema Pilot, DO;  Location: [facility];  Service: General;  Laterality: N/A;  Rectal exam under anesthesia  . Anal fistulectomy  09/17/2012    Procedure: FISTULECTOMY ANAL;  Surgeon: Lodema Pilot, DO;  Location: [facility];  Service: General;  Laterality: N/A;  possible fistulotomy  . Fistula plug  09/17/2012    Procedure: FISTULA PLUG;  Surgeon: Lodema Pilot, DO;  Location: [facility];  Service: General;  Laterality: N/A;  Possible fistula plug  . D & c hysteroscopy/ novasure endometrial ablation/ i & d thrombosed hemorroid  01-10-2007  DR Miguel Aschoff  . Hemorrhoid surgery  04-28-2009    [facility] INTERNAL HEMORRHOIDS  . Examination under anesthesia N/A 05/14/2013    Procedure: EXAM UNDER ANESTHESIA;  Surgeon: Romie Levee, MD;  Location: [facility];  Service: General;  Laterality: N/A;  . Placement of seton N/A 05/14/2013    Procedure:  PLACEMENT OF SETON;  Surgeon: Romie Levee, MD;  Location: [facility];  Service: General;  Laterality: N/A;   History   Social History  . Marital Status: Single    Spouse Name: N/A    Number of Children: N/A  . Years of Education: N/A    Occupational History  . Not on file.   Social History Main Topics  . Smoking status: Never Smoker   . Smokeless tobacco: Never Used  . Alcohol Use: No  . Drug Use: No  . Sexual Activity: Not on file   Other Topics Concern  . Not on file   Social History Narrative  . No narrative on file   History reviewed. No pertinent family history. Allergies  Allergen Reactions  . Ampicillin Anaphylaxis  . Penicillins Anaphylaxis  . Sulfonamide Derivatives Anaphylaxis  . Hydrocodone-Acetaminophen Hives  . Tylenol [Acetaminophen] Itching  . Tramadol Itching   Scheduled Meds:  Continuous Infusions: . sodium chloride 500 mL (07/20/13 0715)   PRN Meds:.  Review of Systems - General ROS: negative for - chills or fever Hematological and Lymphatic ROS: negative for - bleeding problems Cardiovascular ROS: negative for - chest pain or shortness of breath Gastrointestinal ROS: negative for - abdominal pain or diarrhea Genito-Urinary ROS: no dysuria, trouble voiding, or hematuria  Physical Exam  Constitutional: She is oriented to person, place, and time and well-developed, well-nourished, and in no distress.  HENT:  Head: Normocephalic and atraumatic.  Eyes: Pupils are equal, round, and reactive to light.  Neck: Normal range of motion.  Cardiovascular: Normal rate and regular rhythm.   Pulmonary/Chest: Effort normal and breath sounds normal.  Abdominal: Soft. Bowel sounds are normal.  Musculoskeletal: Normal range of motion.  Neurological: She is alert and oriented to person,  place, and time.   A/P: We will perform an endoscopic ultrasound to evaluate the sphincter complex prior to attempting a second repair.  The risks and benefits of the procedure were explained prior to the procedure and consent was signed and placed on chart.  Risks include minimal bleeding and discomfort.

## 2013-07-21 ENCOUNTER — Encounter (HOSPITAL_COMMUNITY): Payer: Self-pay | Admitting: General Surgery

## 2013-08-05 ENCOUNTER — Ambulatory Visit (INDEPENDENT_AMBULATORY_CARE_PROVIDER_SITE_OTHER): Payer: BC Managed Care – PPO | Admitting: General Surgery

## 2013-08-05 ENCOUNTER — Encounter (INDEPENDENT_AMBULATORY_CARE_PROVIDER_SITE_OTHER): Payer: Self-pay | Admitting: General Surgery

## 2013-08-05 VITALS — BP 120/78 | HR 80 | Resp 16 | Ht 64.0 in | Wt 151.2 lb

## 2013-08-05 DIAGNOSIS — K603 Anal fistula, unspecified: Secondary | ICD-10-CM

## 2013-08-05 NOTE — Progress Notes (Signed)
[name] is a 47 y.o. female who is here for a follow up visit regarding her anal fistula.  She is having itching and minimal drainage.  Objective: Filed Vitals:   08/05/13 1459  BP: 120/78  Pulse: 80  Resp: 16    General appearance: alert and cooperative GI: normal findings: soft, non-tender Anal exam: deferred per pt  Assessment and Plan: Doing well.  Will plan for MAF in 2 weeks    .Vanita Panda, MD [facility], [place] [phone]       andIn his stool is as an as part of a treadmill that were is as they had Axis II there is we need used to 3 which is a is exactly is right

## 2013-08-12 ENCOUNTER — Encounter (HOSPITAL_COMMUNITY): Payer: Self-pay | Admitting: Pharmacy Technician

## 2013-08-13 ENCOUNTER — Encounter (HOSPITAL_COMMUNITY)
Admission: RE | Admit: 2013-08-13 | Discharge: 2013-08-13 | Disposition: A | Discharge status: Home / Self Care | Payer: BC Managed Care – PPO | Source: Ambulatory Visit | Attending: General Surgery | Admitting: General Surgery

## 2013-08-13 ENCOUNTER — Encounter (HOSPITAL_COMMUNITY): Payer: Self-pay

## 2013-08-13 DIAGNOSIS — J302 Other seasonal allergic rhinitis: Secondary | ICD-10-CM

## 2013-08-13 DIAGNOSIS — Z01812 Encounter for preprocedural laboratory examination: Secondary | ICD-10-CM | POA: Insufficient documentation

## 2013-08-13 HISTORY — DX: Other seasonal allergic rhinitis: J30.2

## 2013-08-13 LAB — HCG, SERUM, QUALITATIVE: Preg, Serum: NEGATIVE

## 2013-08-13 NOTE — Patient Instructions (Addendum)
20 ARMENTA ERSKIN  08/13/2013   Your procedure is scheduled on 10-16   -2014  Report to [facility] at      0530  AM.  Call this number if you have problems the morning of surgery: [phone]  Or Presurgical Testing [phone](Litzi Binning)   Remember: Follow any bowel prep instructions per MD office.    Do not eat food:After Midnight.   Take these medicines the morning of surgery with A SIP OF WATER: none   Do not wear jewelry, make-up or nail polish.  Do not wear lotions, powders, or perfumes. You may wear deodorant.  Do not shave 12 hours prior to first CHG shower(legs and under arms).(face and neck okay.)  Do not bring valuables to the hospital.  Contacts, dentures or bridgework,body piercing,  may not be worn into surgery.  Leave suitcase in the car. After surgery it may be brought to your room.  For patients admitted to the hospital, checkout time is 11:00 AM the day of discharge.   Patients discharged the day of surgery will not be allowed to drive home. Must have responsible person with you x 24 hours once discharged.  Name and phone number of your driver: Dondra Prader [phone]  Special Instructions: CHG(Chlorhedine 4%-"Hibiclens","Betasept","Aplicare") Shower Use Special Wash: see special instructions.(avoid face and genitals)       Failure to follow these instructions may result in Cancellation of your surgery.   Patient signature_______________________________________________________

## 2013-08-19 NOTE — Anesthesia Preprocedure Evaluation (Addendum)
Anesthesia Evaluation  Patient identified by MRN, date of birth, ID band Patient awake    Reviewed: Allergy & Precautions, H&P , NPO status , Patient's Chart, lab work & pertinent test results  Airway Mallampati: II TM Distance: >3 FB Neck ROM: Full    Dental no notable dental hx. (+) Teeth Intact and Dental Advisory Given   Pulmonary neg pulmonary ROS,  breath sounds clear to auscultation  Pulmonary exam normal       Cardiovascular negative cardio ROS  Rhythm:Regular Rate:Normal     Neuro/Psych  Headaches, negative psych ROS   GI/Hepatic negative GI ROS, Neg liver ROS,   Endo/Other  negative endocrine ROS  Renal/GU negative Renal ROS     Musculoskeletal   Abdominal   Peds  Hematology negative hematology ROS (+)   Anesthesia Other Findings   Reproductive/Obstetrics negative OB ROS                           Anesthesia Physical  Anesthesia Plan  ASA: II  Anesthesia Plan: General   Post-op Pain Management:    Induction: Intravenous  Airway Management Planned: Oral ETT  Additional Equipment:   Intra-op Plan:   Post-operative Plan: Extubation in OR  Informed Consent: I have reviewed the patients History and Physical, chart, labs and discussed the procedure including the risks, benefits and alternatives for the proposed anesthesia with the patient or authorized representative who has indicated his/her understanding and acceptance.   Dental advisory given  Plan Discussed with: CRNA  Anesthesia Plan Comments:         Anesthesia Quick Evaluation                                  Anesthesia Evaluation  Patient identified by MRN, date of birth, ID band Patient awake    Reviewed: Allergy & Precautions, H&P , NPO status , Patient's Chart, lab work & pertinent test results  Airway Mallampati: II TM Distance: >3 FB Neck ROM: Full    Dental No notable dental hx. (+)  Teeth Intact and Dental Advisory Given   Pulmonary neg pulmonary ROS,  breath sounds clear to auscultation  Pulmonary exam normal       Cardiovascular negative cardio ROS  Rhythm:Regular Rate:Normal     Neuro/Psych negative neurological ROS  negative psych ROS   GI/Hepatic negative GI ROS, Neg liver ROS,   Endo/Other  negative endocrine ROS  Renal/GU negative Renal ROS  negative genitourinary   Musculoskeletal   Abdominal   Peds  Hematology negative hematology ROS (+)   Anesthesia Other Findings   Reproductive/Obstetrics negative OB ROS                           Anesthesia Physical Anesthesia Plan  ASA: I  Anesthesia Plan: General   Post-op Pain Management:    Induction: Intravenous  Airway Management Planned: Oral ETT  Additional Equipment:   Intra-op Plan:   Post-operative Plan: Extubation in OR  Informed Consent: I have reviewed the patients History and Physical, chart, labs and discussed the procedure including the risks, benefits and alternatives for the proposed anesthesia with the patient or authorized representative who has indicated his/her understanding and acceptance.   Dental advisory given  Plan Discussed with: CRNA  Anesthesia Plan Comments:         Anesthesia Quick Evaluation

## 2013-08-20 ENCOUNTER — Encounter (HOSPITAL_COMMUNITY): Payer: BC Managed Care – PPO | Admitting: Anesthesiology

## 2013-08-20 ENCOUNTER — Encounter (HOSPITAL_COMMUNITY): Payer: Self-pay | Admitting: *Deleted

## 2013-08-20 ENCOUNTER — Ambulatory Visit (HOSPITAL_COMMUNITY): Payer: BC Managed Care – PPO | Admitting: Anesthesiology

## 2013-08-20 ENCOUNTER — Encounter (HOSPITAL_COMMUNITY)
Admission: RE | Disposition: A | Discharge status: Home / Self Care | Payer: Self-pay | Source: Ambulatory Visit | Attending: General Surgery

## 2013-08-20 ENCOUNTER — Ambulatory Visit (HOSPITAL_COMMUNITY)
Admission: RE | Admit: 2013-08-20 | Discharge: 2013-08-20 | Disposition: A | Discharge status: Home / Self Care | Payer: BC Managed Care – PPO | Source: Ambulatory Visit | Attending: General Surgery | Admitting: General Surgery

## 2013-08-20 DIAGNOSIS — K603 Anal fistula, unspecified: Secondary | ICD-10-CM | POA: Insufficient documentation

## 2013-08-20 DIAGNOSIS — R51 Headache: Secondary | ICD-10-CM | POA: Insufficient documentation

## 2013-08-20 DIAGNOSIS — E785 Hyperlipidemia, unspecified: Secondary | ICD-10-CM | POA: Insufficient documentation

## 2013-08-20 DIAGNOSIS — K5900 Constipation, unspecified: Secondary | ICD-10-CM | POA: Insufficient documentation

## 2013-08-20 DIAGNOSIS — J301 Allergic rhinitis due to pollen: Secondary | ICD-10-CM | POA: Insufficient documentation

## 2013-08-20 HISTORY — PX: RECTAL EXAM UNDER ANESTHESIA: SHX6399

## 2013-08-20 SURGERY — EXAM UNDER ANESTHESIA, RECTUM
Anesthesia: General | Site: Anus | Wound class: Clean Contaminated

## 2013-08-20 MED ORDER — MEPERIDINE HCL 50 MG/ML IJ SOLN
INTRAMUSCULAR | Status: AC
  Filled 2013-08-20: qty 1

## 2013-08-20 MED ORDER — DIBUCAINE 1 % RE OINT
TOPICAL_OINTMENT | RECTAL | Status: AC
  Filled 2013-08-20: qty 28

## 2013-08-20 MED ORDER — LIDOCAINE HCL 2 % EX GEL
CUTANEOUS | Status: AC
  Filled 2013-08-20: qty 10

## 2013-08-20 MED ORDER — METRONIDAZOLE IN NACL 5-0.79 MG/ML-% IV SOLN
500.0000 mg | INTRAVENOUS | Status: AC
  Administered 2013-08-20: 500 mg via INTRAVENOUS

## 2013-08-20 MED ORDER — CIPROFLOXACIN IN D5W 400 MG/200ML IV SOLN
INTRAVENOUS | Status: AC
  Filled 2013-08-20: qty 200

## 2013-08-20 MED ORDER — MEPERIDINE HCL 50 MG/ML IJ SOLN
6.2500 mg | INTRAMUSCULAR | Status: DC | PRN
  Administered 2013-08-20: 12.5 mg via INTRAVENOUS

## 2013-08-20 MED ORDER — LACTATED RINGERS IV SOLN
INTRAVENOUS | Status: DC | PRN
  Administered 2013-08-20 (×2): via INTRAVENOUS

## 2013-08-20 MED ORDER — FENTANYL CITRATE 0.05 MG/ML IJ SOLN
INTRAMUSCULAR | Status: DC | PRN
  Administered 2013-08-20: 100 ug via INTRAVENOUS

## 2013-08-20 MED ORDER — MIDAZOLAM HCL 5 MG/5ML IJ SOLN
INTRAMUSCULAR | Status: DC | PRN
  Administered 2013-08-20: 2 mg via INTRAVENOUS

## 2013-08-20 MED ORDER — KETOROLAC TROMETHAMINE 30 MG/ML IJ SOLN
INTRAMUSCULAR | Status: DC | PRN
  Administered 2013-08-20: 30 mg via INTRAVENOUS

## 2013-08-20 MED ORDER — LIDOCAINE HCL 2 % EX GEL
CUTANEOUS | Status: DC | PRN
  Administered 2013-08-20: 1 via TOPICAL

## 2013-08-20 MED ORDER — PHENYLEPHRINE HCL 10 MG/ML IJ SOLN
INTRAMUSCULAR | Status: DC | PRN
  Administered 2013-08-20 (×4): 80 ug via INTRAVENOUS
  Administered 2013-08-20 (×2): 40 ug via INTRAVENOUS
  Administered 2013-08-20 (×2): 80 ug via INTRAVENOUS

## 2013-08-20 MED ORDER — OXYCODONE HCL 5 MG PO TABS: 5.0000 mg | ORAL_TABLET | Freq: Four times a day (QID) | ORAL | Status: DC | PRN

## 2013-08-20 MED ORDER — NEOSTIGMINE METHYLSULFATE 1 MG/ML IJ SOLN
INTRAMUSCULAR | Status: DC | PRN
  Administered 2013-08-20: 4 mg via INTRAVENOUS

## 2013-08-20 MED ORDER — BUPIVACAINE-EPINEPHRINE 0.25% -1:200000 IJ SOLN
INTRAMUSCULAR | Status: DC | PRN
  Administered 2013-08-20: 40 mL

## 2013-08-20 MED ORDER — HYDROGEN PEROXIDE 3 % EX SOLN
CUTANEOUS | Status: DC | PRN
  Administered 2013-08-20: 1

## 2013-08-20 MED ORDER — HYDROMORPHONE HCL PF 1 MG/ML IJ SOLN: 0.2500 mg | INTRAMUSCULAR | Status: DC | PRN

## 2013-08-20 MED ORDER — PROMETHAZINE HCL 25 MG/ML IJ SOLN: 6.2500 mg | INTRAMUSCULAR | Status: DC | PRN

## 2013-08-20 MED ORDER — HYDROGEN PEROXIDE 3 % EX SOLN
CUTANEOUS | Status: AC
Start: 2013-08-20 — End: 2013-08-20
  Filled 2013-08-20: qty 473

## 2013-08-20 MED ORDER — KETAMINE HCL 50 MG/ML IJ SOLN
INTRAMUSCULAR | Status: DC | PRN
  Administered 2013-08-20: 25 mg via INTRAMUSCULAR
  Administered 2013-08-20: 15 mg via INTRAMUSCULAR
  Administered 2013-08-20: 25 mg via INTRAMUSCULAR

## 2013-08-20 MED ORDER — ONDANSETRON HCL 4 MG/2ML IJ SOLN
INTRAMUSCULAR | Status: DC | PRN
  Administered 2013-08-20: 4 mg via INTRAMUSCULAR

## 2013-08-20 MED ORDER — GLYCOPYRROLATE 0.2 MG/ML IJ SOLN
INTRAMUSCULAR | Status: DC | PRN
  Administered 2013-08-20: 0.6 mg via INTRAVENOUS

## 2013-08-20 MED ORDER — PROPOFOL 10 MG/ML IV BOLUS
INTRAVENOUS | Status: DC | PRN
  Administered 2013-08-20: 200 mg via INTRAVENOUS

## 2013-08-20 MED ORDER — LIDOCAINE HCL (CARDIAC) 20 MG/ML IV SOLN
INTRAVENOUS | Status: DC | PRN
  Administered 2013-08-20: 75 mg via INTRAVENOUS

## 2013-08-20 MED ORDER — METRONIDAZOLE IN NACL 5-0.79 MG/ML-% IV SOLN
INTRAVENOUS | Status: AC
  Filled 2013-08-20: qty 100

## 2013-08-20 MED ORDER — BUPIVACAINE-EPINEPHRINE 0.25% -1:200000 IJ SOLN
INTRAMUSCULAR | Status: AC
  Filled 2013-08-20: qty 1

## 2013-08-20 MED ORDER — DIBUCAINE 1 % RE OINT
TOPICAL_OINTMENT | RECTAL | Status: DC | PRN
  Administered 2013-08-20: 1 via RECTAL

## 2013-08-20 MED ORDER — ESMOLOL HCL 10 MG/ML IV SOLN
INTRAVENOUS | Status: DC | PRN
  Administered 2013-08-20: 30 mg via INTRAVENOUS
  Administered 2013-08-20: 20 mg via INTRAVENOUS

## 2013-08-20 MED ORDER — ROCURONIUM BROMIDE 100 MG/10ML IV SOLN
INTRAVENOUS | Status: DC | PRN
  Administered 2013-08-20: 40 mg via INTRAVENOUS

## 2013-08-20 MED ORDER — CIPROFLOXACIN IN D5W 400 MG/200ML IV SOLN
400.0000 mg | INTRAVENOUS | Status: AC
  Administered 2013-08-20: 400 mg via INTRAVENOUS

## 2013-08-20 SURGICAL SUPPLY — 33 items
BLADE EXTENDED COATED 6.5IN (ELECTRODE) ×1 IMPLANT
BLADE HEX COATED 2.75 (ELECTRODE) ×2 IMPLANT
BLADE SURG 15 STRL LF DISP TIS (BLADE) ×1 IMPLANT
BLADE SURG 15 STRL SS (BLADE) ×2
CANISTER SUCTION 2500CC (MISCELLANEOUS) ×2 IMPLANT
CLOTH BEACON ORANGE TIMEOUT ST (SAFETY) ×1 IMPLANT
DECANTER SPIKE VIAL GLASS SM (MISCELLANEOUS) ×2 IMPLANT
DRSG PAD ABDOMINAL 8X10 ST (GAUZE/BANDAGES/DRESSINGS) IMPLANT
ELECT REM PT RETURN 9FT ADLT (ELECTROSURGICAL) ×2
ELECTRODE REM PT RTRN 9FT ADLT (ELECTROSURGICAL) ×1 IMPLANT
GAUZE SPONGE 4X4 16PLY XRAY LF (GAUZE/BANDAGES/DRESSINGS) ×2 IMPLANT
GLOVE BIO SURGEON STRL SZ 6.5 (GLOVE) ×2 IMPLANT
GLOVE BIOGEL PI IND STRL 7.0 (GLOVE) ×1 IMPLANT
GLOVE BIOGEL PI INDICATOR 7.0 (GLOVE) ×3
GOWN BRE IMP PREV XXLGXLNG (GOWN DISPOSABLE) ×2 IMPLANT
GOWN PREVENTION PLUS LG XLONG (DISPOSABLE) ×2 IMPLANT
KIT BASIN OR (CUSTOM PROCEDURE TRAY) ×2 IMPLANT
LUBRICANT JELLY K Y 4OZ (MISCELLANEOUS) ×2 IMPLANT
NDL HYPO 25X1 1.5 SAFETY (NEEDLE) ×1 IMPLANT
NEEDLE HYPO 25X1 1.5 SAFETY (NEEDLE) ×2 IMPLANT
NS IRRIG 1000ML POUR BTL (IV SOLUTION) ×2 IMPLANT
PACK LITHOTOMY IV (CUSTOM PROCEDURE TRAY) ×2 IMPLANT
PENCIL BUTTON HOLSTER BLD 10FT (ELECTRODE) ×2 IMPLANT
SPONGE GAUZE 4X4 12PLY (GAUZE/BANDAGES/DRESSINGS) ×1 IMPLANT
SUT CHROMIC 2 0 SH (SUTURE) IMPLANT
SUT CHROMIC 3 0 SH 27 (SUTURE) IMPLANT
SUT MON AB 3-0 SH 27 (SUTURE) ×4
SUT MON AB 3-0 SH27 (SUTURE) ×2 IMPLANT
SUT VIC AB 4-0 P-3 18XBRD (SUTURE) IMPLANT
SUT VIC AB 4-0 P3 18 (SUTURE)
SYR CONTROL 10ML LL (SYRINGE) ×2 IMPLANT
TOWEL OR 17X26 10 PK STRL BLUE (TOWEL DISPOSABLE) ×2 IMPLANT
YANKAUER SUCT BULB TIP 10FT TU (MISCELLANEOUS) ×2 IMPLANT

## 2013-08-20 NOTE — Transfer of Care (Signed)
Immediate Anesthesia Transfer of Care Note  Patient: [name]  Procedure(s) Performed: Procedure(s) with comments: mucosal advancement flap  (N/A) - parks anal retractor long rectal instrucments prone jack knife anal fistula   Patient Location: PACU  Anesthesia Type:General  Level of Consciousness: awake and patient cooperative  Airway & Oxygen Therapy: Patient Spontanous Breathing and Patient connected to face mask oxygen  Post-op Assessment: Report given to PACU RN and Post -op Vital signs reviewed and stable  Post vital signs: Reviewed and stable  Complications: No apparent anesthesia complications

## 2013-08-20 NOTE — Preoperative (Signed)
Beta Blockers   Reason not to administer Beta Blockers:Not Applicable 

## 2013-08-20 NOTE — H&P (Signed)
[name] is a 47 y.o. F who has failed a fistula plug.  She has had a seton in place for 3 months.    Past Medical History  Diagnosis Date  . Hyperlipidemia   . 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 Constipation   . Seasonal allergies 08-13-13    tx. Claritin   Past Surgical History  Procedure Laterality Date  . Irrigation and debridement abscess  07/08/2012    Procedure: MINOR INCISION AND DRAINAGE OF ABSCESS;  Surgeon: Geryl Rankins, MD;  Location: [facility];  Service: Gynecology;  Laterality: N/A;  Vulvar Abscess  . Anal fistulectomy  08/22/2012    Procedure: FISTULECTOMY ANAL;  Surgeon: Lodema Pilot, DO;  Location: [facility];  Service: General;  Laterality: N/A;  rectal examination under anesthesia with seton placement   . Examination under anesthesia  09/17/2012    Procedure: EXAM UNDER ANESTHESIA;  Surgeon: Lodema Pilot, DO;  Location: [facility];  Service: General;  Laterality: N/A;  Rectal exam under anesthesia  . Anal fistulectomy  09/17/2012    Procedure: FISTULECTOMY ANAL;  Surgeon: Lodema Pilot, DO;  Location: [facility];  Service: General;  Laterality: N/A;  possible fistulotomy  . Fistula plug  09/17/2012    Procedure: FISTULA PLUG;  Surgeon: Lodema Pilot, DO;  Location: [facility];  Service: General;  Laterality: N/A;  Possible fistula plug  . D & c hysteroscopy/ novasure endometrial ablation/ i & d thrombosed hemorroid  01-10-2007  DR Miguel Aschoff  . Hemorrhoid surgery  04-28-2009    PPH INTERNAL HEMORRHOIDS  . Examination under anesthesia N/A 05/14/2013    Procedure: EXAM UNDER ANESTHESIA;  Surgeon: Romie Levee, MD;  Location: [facility];  Service: General;  Laterality: N/A;  . Placement of seton N/A 05/14/2013    Procedure:  PLACEMENT OF SETON;  Surgeon: Romie Levee, MD;  Location: [facility];  Service: General;  Laterality: N/A;  . Rectal ultrasound N/A 07/20/2013    Procedure: RECTAL  ULTRASOUND;  Surgeon: Romie Levee, MD;  Location: [facility];  Service: Endoscopy;  Laterality: N/A;   Allergies  Allergen Reactions  . Ampicillin Anaphylaxis  . Penicillins Anaphylaxis  . Sulfonamide Derivatives Anaphylaxis  . Hydrocodone-Acetaminophen Hives  . Tylenol [Acetaminophen] Itching  . Tramadol Itching   Scheduled Meds: . metronidazole  500 mg Intravenous On Call to OR   And  . ciprofloxacin  400 mg Intravenous On Call to OR   Continuous Infusions:  PRN Meds:.  Review of Systems - General ROS: negative for - chills or fever Respiratory ROS: no cough, shortness of breath, or wheezing Cardiovascular ROS: no chest pain or dyspnea on exertion Gastrointestinal ROS: no abdominal pain, change in bowel habits, or black or bloody stools Genito-Urinary ROS: no dysuria, trouble voiding, or hematuria  Filed Vitals:   08/20/13 0523  BP: 131/83  Pulse: 78  Temp: 98.3 F (36.8 C)  Resp: 18   Physical Exam  Constitutional: She appears well-developed and well-nourished.  HENT:  Head: Normocephalic and atraumatic.  Eyes: EOM are normal. Pupils are equal, round, and reactive to light.  Neck: Normal range of motion.  Cardiovascular: Normal rate and regular rhythm.   Pulmonary/Chest: Effort normal and breath sounds normal.  Abdominal: Soft. Bowel sounds are normal.  Skin: Skin is warm and dry.    Assessment and plan Recurrent fistula.  Will proceed with MAF.  Risks of surgery explained.  These include bleeding, infection, pain, small risk of  incontinence, recurrence (30-40%).  She understands these and agrees to proceed.

## 2013-08-20 NOTE — Progress Notes (Signed)
Short Stay Phase 2 post op. Ambulated to BR with 2 people. Unsteady on feet but alert. Voided large amount clear yellow urine. Gauze pad to rectum removed due to saturated with urine. small amount of serosang drainage on pad and in toilet after urination. Fresh gauze applied between buttock and pt assisted back to bed.

## 2013-08-20 NOTE — Op Note (Signed)
08/20/2013  9:19 AM  PATIENT:  [name]  47 y.o. female  Patient Care Team: Geryl Rankins, MD as PCP - General (Obstetrics and Gynecology)  PRE-OPERATIVE DIAGNOSIS:  Recurrent anal fistula  POST-OPERATIVE DIAGNOSIS:  Recurrent anal fistula  PROCEDURE:  mucosal advancement flap   SURGEON:  Surgeon(s): Romie Levee, MD  ASSISTANT: none   ANESTHESIA:   local and general  EBL:  Total I/O In: 1000 [I.V.:1000] Out: -   DRAINS: none   SPECIMEN:  No Specimen  DISPOSITION OF SPECIMEN:  N/A  COUNTS:  YES  PLAN OF CARE: Discharge to home after PACU  PATIENT DISPOSITION:  PACU - hemodynamically stable.  INDICATION: this is a 47 year old female who presents to my office with a recurrent perianal fistula after fistula plug placement. She was taken to the operating room 3 months ago and a seton was placed. She is here today for definitive treatment. The risks benefits and alternative treatments all been discussed with the patient and she has elected to proceed with a mucosal advancement flap.   OR FINDINGS: left anterior fistula at the dentate line.  thick perineal body with external sphincter intact.  DESCRIPTION: the patient was identified in the preoperative holding area taken to the OR where she was laid supine on the operating room gurney. General anesthesia was smoothly induced. The patient was then placed prone on the operating room table in jackknife position. She was a properly padded and then prepped and draped in usual sterile fashion. After this was completed a surgical timeout was performed indicating correct patient, procedure, positioning and preoperative antibiotics. SCDs were also noted to be in place prior to the initiation of anesthesia. I began by performing a digital rectal exam. The fistula internal opening could be palpated just above the dentate line. The external opening was in the left anterior position and a radial tract. The seton was in place. I removed  the seton. I then marked out a advancement flap using Bovie electric cautery. I made sure that the base was twice as wide as the tip. I then injected the area with Marcaine with epinephrine. This lifted the submucosal plane and allow for easy dissection. This was done using Bovie electrocautery and blunt dissection. A flap was raised approximately 3-4 cm proximal into the rectum. Hemostasis was achieved as we went. Once the flap was completely free the sides of the flap were sutured using running 3-0 chromic sutures. The internal fistula opening was closed using 2 interrupted figure-of-eight 2-0 Vicryl sutures. The top of the flap was brought out to the level of the skin. This was sutured to the skin using interrupted 3-0 chromic sutures. The end of the flap was trimmed off. The external opening was then widened using Bovie electrocautery. I then evaluated the flap by injecting hydrogen peroxide into the external opening. There were a couple areas of leakage which were close using interrupted 3-0 chromic suture. After this the flap was irrigated with saline. The anal canal was irrigated and determined to be hemostatic. A rectal block was performed using quarter percent Marcaine with epinephrine. A sterile dressing was applied. The patient was awakened from anesthesia and sent to the post anesthesia care unit in stable condition. All counts were correct per operating room staff.

## 2013-08-20 NOTE — Anesthesia Postprocedure Evaluation (Signed)
Anesthesia Post Note  Patient: [name]  Procedure(s) Performed: Procedure(s) (LRB): mucosal advancement flap  (N/A)  Anesthesia type: General  Patient location: PACU  Post pain: Pain level controlled  Post assessment: Post-op Vital signs reviewed  Last Vitals: BP 125/75  Pulse 78  Temp(Src) 36.5 C (Oral)  Resp 16  SpO2 99%  Post vital signs: Reviewed  Level of consciousness: sedated  Complications: No apparent anesthesia complications

## 2013-08-20 NOTE — Progress Notes (Signed)
Short Stay Phase 2 post op Pt was able to ambulate in room with minimal assist in room and tolerated this well. Pt is eager to go home

## 2013-08-21 ENCOUNTER — Encounter (HOSPITAL_COMMUNITY): Payer: Self-pay | Admitting: General Surgery

## 2013-09-04 ENCOUNTER — Telehealth (INDEPENDENT_AMBULATORY_CARE_PROVIDER_SITE_OTHER): Payer: Self-pay

## 2013-09-04 DIAGNOSIS — K6289 Other specified diseases of anus and rectum: Secondary | ICD-10-CM

## 2013-09-04 MED ORDER — IBUPROFEN 400 MG PO TABS: 800.0000 mg | ORAL_TABLET | Freq: Four times a day (QID) | ORAL | Status: DC | PRN

## 2013-09-04 NOTE — Addendum Note (Signed)
Addended by: Vanita Panda on: 09/04/2013 10:26 AM   Modules accepted: Orders

## 2013-09-04 NOTE — Telephone Encounter (Addendum)
Rx sent to pharmacy.  Please inform pt.   Pt called in stating she does not want to take any narcotic medication anymore. She wants to ask if Dr Maisie Fus will send a prescription for 800 mg ibuprofen to her pharmacy. Advised I will send this request to Dr Janee Morn and we will call her later on this afternoon.

## 2013-09-04 NOTE — Telephone Encounter (Signed)
I called and notified the pt her RX was sent to the pharmacy

## 2013-09-10 ENCOUNTER — Encounter (INDEPENDENT_AMBULATORY_CARE_PROVIDER_SITE_OTHER): Payer: Self-pay

## 2013-09-10 ENCOUNTER — Ambulatory Visit (INDEPENDENT_AMBULATORY_CARE_PROVIDER_SITE_OTHER): Payer: BC Managed Care – PPO | Admitting: General Surgery

## 2013-09-10 ENCOUNTER — Encounter (INDEPENDENT_AMBULATORY_CARE_PROVIDER_SITE_OTHER): Payer: Self-pay | Admitting: General Surgery

## 2013-09-10 VITALS — BP 132/84 | HR 72 | Temp 97.9°F | Resp 16 | Ht 64.0 in | Wt 150.8 lb

## 2013-09-10 DIAGNOSIS — K603 Anal fistula: Secondary | ICD-10-CM

## 2013-09-10 DIAGNOSIS — Z9889 Other specified postprocedural states: Secondary | ICD-10-CM

## 2013-09-10 MED ORDER — LIDOCAINE 5 % EX OINT
1.0000 | TOPICAL_OINTMENT | CUTANEOUS | Status: DC | PRN
Start: 2013-09-10 — End: 2014-11-24

## 2013-09-10 MED ORDER — OXYCODONE HCL 5 MG PO TABS: 5.0000 mg | ORAL_TABLET | Freq: Four times a day (QID) | ORAL | Status: DC | PRN

## 2013-09-10 NOTE — Patient Instructions (Signed)
Return to the office in 4 weeks 

## 2013-09-10 NOTE — Progress Notes (Signed)
[name] is a 47 y.o. female who is status post a MAF on 10/16.  She is doing well.  She is still having some shooting type pain.  Her bowels are moving well.  Objective: Filed Vitals:   09/10/13 1554  BP: 132/84  Pulse: 72  Temp: 97.9 F (36.6 C)  Resp: 16    General appearance: alert and cooperative GI: normal findings: soft, non-tender  Incision: healing well, no purulent drainage at fistula site DRE: flap intact   Assessment: s/p  Patient Active Problem List   Diagnosis Date Noted  . Anal fistula 04/14/2013  . UNSPECIFIED SITE OF SPRAIN AND STRAIN 07/07/2009  . PEDAL EDEMA 06/29/2009  . MUSCLE PAIN 06/23/2009  . 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 ALLERGIC RHINITIS 03/11/2009  . SYNCOPE, VASOVAGAL 03/11/2009  . HEADACHE 03/11/2009    Plan: Doing well.  Use miralax as needed for hard stools.  Cont fiber supplement.  Refilled pain meds.  RTO in 4 wks    .Vanita Panda, MD [facility], [place] [phone]   09/10/2013 4:05 PM

## 2013-09-10 NOTE — Addendum Note (Signed)
Addended by: Vanita Panda on: 09/10/2013 04:11 PM   Modules accepted: Orders

## 2013-09-15 ENCOUNTER — Encounter (INDEPENDENT_AMBULATORY_CARE_PROVIDER_SITE_OTHER): Payer: Self-pay

## 2013-09-15 ENCOUNTER — Telehealth (INDEPENDENT_AMBULATORY_CARE_PROVIDER_SITE_OTHER): Payer: Self-pay

## 2013-09-15 NOTE — Telephone Encounter (Signed)
Called patient to make aware that Dr. Maisie Fus has completed a RTW note and attached to Short Term Disability Forms.  Patient advised that RTW note states she may return on 09/17/13 with Light Duty Restrictions or return to Full Duty on or after 10/12/2013.  Forms signed and placed in medical records to be faxed and scanned into patient's chart.

## 2013-10-06 ENCOUNTER — Ambulatory Visit (INDEPENDENT_AMBULATORY_CARE_PROVIDER_SITE_OTHER): Payer: BC Managed Care – PPO | Admitting: General Surgery

## 2013-10-06 ENCOUNTER — Encounter (INDEPENDENT_AMBULATORY_CARE_PROVIDER_SITE_OTHER): Payer: Self-pay

## 2013-10-06 ENCOUNTER — Encounter (INDEPENDENT_AMBULATORY_CARE_PROVIDER_SITE_OTHER): Payer: Self-pay | Admitting: General Surgery

## 2013-10-06 VITALS — BP 138/88 | HR 80 | Temp 97.8°F | Resp 14 | Ht 64.0 in | Wt 151.4 lb

## 2013-10-06 DIAGNOSIS — Z9889 Other specified postprocedural states: Secondary | ICD-10-CM

## 2013-10-06 NOTE — Patient Instructions (Signed)
Return to the office in 2 weeks 

## 2013-10-06 NOTE — Progress Notes (Signed)
[name] is a 47 y.o. female who is status post a MAF on 10/16. She is doing well. She is still having some shooting type pain. Her bowels are moving well.   Objective:  Filed Vitals:   10/06/13 1016  BP: 138/88  Pulse: 80  Temp: 97.8 F (36.6 C)  Resp: 14   General appearance: alert and cooperative  GI: normal findings: soft, non-tender  Incision: granulation tissue noted at ext site DRE: flap intact but purulence noted internally and at external site Assessment:  s/p  Patient Active Problem List    Diagnosis  Date Noted   .  Anal fistula  04/14/2013   .  UNSPECIFIED SITE OF SPRAIN AND STRAIN  07/07/2009   .  PEDAL EDEMA  06/29/2009   .  MUSCLE PAIN  06/23/2009   .  ANEMIA-IRON DEFICIENCY  03/11/2009   .  ALLERGIC RHINITIS  03/11/2009   .  SYNCOPE, VASOVAGAL  03/11/2009   .  HEADACHE  03/11/2009   Plan:  Doing well. Use miralax as needed for hard stools. Cont fiber supplement. Refilled pain meds. RTO in 2 wks. If drainage hasn't stopped, may need EUA.   Marland KitchenVanita Panda, MD  [facility], [place]  [phone]

## 2013-10-09 ENCOUNTER — Other Ambulatory Visit (INDEPENDENT_AMBULATORY_CARE_PROVIDER_SITE_OTHER): Payer: Self-pay | Admitting: General Surgery

## 2013-10-09 ENCOUNTER — Telehealth (INDEPENDENT_AMBULATORY_CARE_PROVIDER_SITE_OTHER): Payer: Self-pay

## 2013-10-09 DIAGNOSIS — K6289 Other specified diseases of anus and rectum: Secondary | ICD-10-CM

## 2013-10-09 MED ORDER — IBUPROFEN 400 MG PO TABS: 800.0000 mg | ORAL_TABLET | Freq: Four times a day (QID) | ORAL | Status: DC | PRN

## 2013-10-09 NOTE — Telephone Encounter (Signed)
Ibuprofen refilled.  Please notify pt

## 2013-10-09 NOTE — Telephone Encounter (Signed)
Pt is s/p MAF on 08/20/13 by Dr. Maisie Fus.  She was seen in clinic on 12/2 for a f/u exam.  She states she has been hurting since that office visit.  She does not need a narcotic; prescription Ibuprofen would be fine.  Message will be forwarded to Dr. Maisie Fus, and she would hear back from the nurse this afternoon.

## 2013-10-09 NOTE — Telephone Encounter (Signed)
Notified pt Rx for Ibuprofen sent to her pharmacy.

## 2013-10-20 ENCOUNTER — Encounter (INDEPENDENT_AMBULATORY_CARE_PROVIDER_SITE_OTHER): Payer: Self-pay | Admitting: General Surgery

## 2013-10-20 ENCOUNTER — Ambulatory Visit (INDEPENDENT_AMBULATORY_CARE_PROVIDER_SITE_OTHER): Payer: BC Managed Care – PPO | Admitting: General Surgery

## 2013-10-20 VITALS — BP 140/96 | HR 76 | Temp 98.7°F | Resp 14 | Ht 64.0 in | Wt 151.0 lb

## 2013-10-20 DIAGNOSIS — K603 Anal fistula: Secondary | ICD-10-CM

## 2013-10-20 DIAGNOSIS — K605 Anorectal fistula: Secondary | ICD-10-CM

## 2013-10-20 MED ORDER — OXYCODONE HCL 5 MG PO TABS: 5.0000 mg | ORAL_TABLET | Freq: Four times a day (QID) | ORAL | Status: DC | PRN

## 2013-10-20 NOTE — Patient Instructions (Signed)
Call the office within the next couple weeks to let me know your decision.

## 2013-10-20 NOTE — Progress Notes (Signed)
[name] is a 47 y.o. female who is status post a MAF in Oct.  She continues to have pain and drainage.    Objective: Filed Vitals:   10/20/13 1008  BP: 140/96  Pulse: 76  Temp: 98.7 F (37.1 C)  Resp: 14    General appearance: alert and cooperative GI: normal findings: soft, non-tender  Incision: granulation tissue present, no fluctuant masses, no purulent drainage.     Assessment: s/p  Patient Active Problem List   Diagnosis Date Noted  . Anal fistula 04/14/2013  . UNSPECIFIED SITE OF SPRAIN AND STRAIN 07/07/2009  . PEDAL EDEMA 06/29/2009  . MUSCLE PAIN 06/23/2009  . 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 ALLERGIC RHINITIS 03/11/2009  . SYNCOPE, VASOVAGAL 03/11/2009  . HEADACHE 03/11/2009    Plan: I suspect she has a recurrent fistula after a mucosal advancement flap. I recommended exam under anesthesia in the operating room with possible seton placement versus fistulotomy.  I also offered a second opinion at a tertiary care center. The patient will discuss this with her family and call us back in the next couple weeks with her decision. I gave her a small amount of oxycodone to help with pain at night.    Vanita Panda, MD [facility] Surgery, [place] [phone]   10/20/2013 10:47 AM

## 2013-10-21 ENCOUNTER — Other Ambulatory Visit (INDEPENDENT_AMBULATORY_CARE_PROVIDER_SITE_OTHER): Payer: Self-pay

## 2013-10-21 DIAGNOSIS — K605 Anorectal fistula: Secondary | ICD-10-CM

## 2013-10-23 ENCOUNTER — Telehealth (INDEPENDENT_AMBULATORY_CARE_PROVIDER_SITE_OTHER): Payer: Self-pay | Admitting: *Deleted

## 2013-10-23 NOTE — Telephone Encounter (Signed)
I spoke with pt and informed her of the appt scheduled at [facility] with Dr. Byrd Hesselbach on 11/06/13 @ 9:00am.  I informed pt that they would be sending her a packet with all of her appt information as well as their address and where to park.  She is agreeable with this appt.

## 2013-11-20 ENCOUNTER — Other Ambulatory Visit (INDEPENDENT_AMBULATORY_CARE_PROVIDER_SITE_OTHER): Payer: Self-pay

## 2013-11-20 DIAGNOSIS — K603 Anal fistula: Secondary | ICD-10-CM

## 2013-11-20 MED ORDER — DOCUSATE SODIUM 100 MG PO CAPS: 100.0000 mg | ORAL_CAPSULE | Freq: Two times a day (BID) | ORAL | Status: DC

## 2014-01-19 ENCOUNTER — Other Ambulatory Visit: Payer: Self-pay

## 2014-03-25 ENCOUNTER — Other Ambulatory Visit: Payer: Self-pay | Admitting: *Deleted

## 2014-03-25 DIAGNOSIS — R209 Unspecified disturbances of skin sensation: Secondary | ICD-10-CM

## 2014-04-16 ENCOUNTER — Encounter: Payer: Self-pay | Admitting: Surgery

## 2014-04-19 ENCOUNTER — Encounter: Payer: Self-pay | Admitting: Surgery

## 2014-04-19 ENCOUNTER — Ambulatory Visit (HOSPITAL_COMMUNITY)
Admission: RE | Admit: 2014-04-19 | Discharge: 2014-04-19 | Disposition: A | Discharge status: Home / Self Care | Payer: BC Managed Care – PPO | Source: Ambulatory Visit | Attending: Surgery | Admitting: Surgery

## 2014-04-19 ENCOUNTER — Other Ambulatory Visit: Payer: Self-pay | Admitting: Surgery

## 2014-04-19 ENCOUNTER — Ambulatory Visit (INDEPENDENT_AMBULATORY_CARE_PROVIDER_SITE_OTHER): Payer: BC Managed Care – PPO | Admitting: Surgery

## 2014-04-19 VITALS — BP 168/98 | HR 81 | Resp 18 | Ht 64.0 in | Wt 151.1 lb

## 2014-04-19 DIAGNOSIS — R209 Unspecified disturbances of skin sensation: Secondary | ICD-10-CM | POA: Insufficient documentation

## 2014-04-19 DIAGNOSIS — M79609 Pain in unspecified limb: Secondary | ICD-10-CM

## 2014-04-19 DIAGNOSIS — M79601 Pain in right arm: Secondary | ICD-10-CM | POA: Insufficient documentation

## 2014-04-19 NOTE — Progress Notes (Signed)
Patient name: [name] J Puzzo MRN: [id number] DOB: 02-Sep-1966 Sex: female   Referred by: Dr. Nickola MajorHawkes  Reason for referral:  Chief Complaint  Patient presents with  . New Evaluation    constant sharp right hand and arm since 01-25-2014     HISTORY OF PRESENT ILLNESS: This is a very pleasant 48 year old female who I had seen in the past, and 2010 for leg pain and swelling.  She comes back today for evaluation of right hand issues.  The patient states that since March 23 she has been experiencing right hand swelling and pain in her right arm.  She used to work as a Ambulance personcash register.  She states the pain goes from her shoulder to her wrist.  The swelling is mostly in her hand.  Manual manipulation of her arm doesn't hurt initially but does have delayed pain.  She has trouble opening and closing her hand secondary to the swelling.  She has not been able to to work.  She has no significant other medical problems.  She has been told she has a bulging disc at C5-C6  Past Medical History  Diagnosis Date  . Hyperlipidemia   . 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 Constipation   . Seasonal allergies 08-13-13    tx. Claritin    Past Surgical History  Procedure Laterality Date  . Irrigation and debridement abscess  07/08/2012    Procedure: MINOR INCISION AND DRAINAGE OF ABSCESS;  Surgeon: Geryl RankinsEvelyn Varnado, MD;  [facility] [place];  Service: Gynecology;  Laterality: N/A;  Vulvar Abscess  . Anal fistulectomy  08/22/2012    Procedure: FISTULECTOMY ANAL;  Surgeon: Lodema PilotBrian Layton, DO;  Location: [facility];  Service: General;  Laterality: N/A;  rectal examination under anesthesia with seton placement   . Examination under anesthesia  09/17/2012    Procedure: EXAM UNDER ANESTHESIA;  Surgeon: Lodema PilotBrian Layton, DO;  Location: [facility];  Service: General;  Laterality: N/A;  Rectal exam under anesthesia  . Anal fistulectomy  09/17/2012    Procedure: FISTULECTOMY ANAL;  Surgeon: Lodema PilotBrian Layton, DO;   Location: [facility];  Service: General;  Laterality: N/A;  possible fistulotomy  . Fistula plug  09/17/2012    Procedure: FISTULA PLUG;  Surgeon: Lodema PilotBrian Layton, DO;  Location: [facility];  Service: General;  Laterality: N/A;  Possible fistula plug  . D & c hysteroscopy/ novasure endometrial ablation/ i & d thrombosed hemorroid  01-10-2007  DR Miguel AschoffALLAN ROSS  . Hemorrhoid surgery  04-28-2009    [facility] INTERNAL HEMORRHOIDS  . Examination under anesthesia N/A 05/14/2013    Procedure: EXAM UNDER ANESTHESIA;  Surgeon: Romie LeveeAlicia Thomas, MD;  [facility] [place];  Service: General;  Laterality: N/A;  . Placement of seton N/A 05/14/2013    Procedure:  PLACEMENT OF SETON;  Surgeon: Romie LeveeAlicia Thomas, MD;  [facility] [place];  Service: General;  Laterality: N/A;  . Rectal ultrasound N/A 07/20/2013    Procedure: RECTAL ULTRASOUND;  Surgeon: Romie LeveeAlicia Thomas, MD;  Location: [facility];  Service: Endoscopy;  Laterality: N/A;  . Rectal exam under anesthesia N/A 08/20/2013    Procedure: mucosal advancement flap ;  Surgeon: Romie LeveeAlicia Thomas, MD;  Location: [facility];  Service: General;  Laterality: N/A;  parks anal retractor long rectal instrucments prone jack knife anal fistula     History   Social History  . Marital Status: Single    Spouse Name: N/A    Number of Children: N/A  .  Years of Education: N/A   Occupational History  . Not on file.   Social History Main Topics  . Smoking status: Never Smoker   . Smokeless tobacco: Never Used  . Alcohol Use: No  . Drug Use: No  . Sexual Activity: Yes    Birth Control/ Protection: Pill   Other Topics Concern  . Not on file   Social History Narrative  . No narrative on file    History reviewed. No pertinent family history.  Allergies as of 04/19/2014 - Review Complete 04/19/2014  Allergen Reaction Noted  . Ampicillin Anaphylaxis 07/04/2012  . Penicillins Anaphylaxis   . Sulfonamide derivatives  Anaphylaxis   . Hydrocodone-acetaminophen Hives 09/11/2012  . Tylenol [acetaminophen] Itching   . Tramadol Itching 08/18/2012    Current Outpatient Prescriptions on File Prior to Visit  Medication Sig Dispense Refill  . docusate sodium (COLACE) 100 MG capsule Take 1 capsule (100 mg total) by mouth 2 (two) times daily.  60 capsule  3  . lidocaine (XYLOCAINE) 5 % ointment Apply 1 application topically as needed.  35.44 g  0  . loratadine (CLARITIN) 10 MG tablet Take 10 mg by mouth daily.      . Multiple Vitamin (MULTIVITAMIN WITH MINERALS) TABS tablet Take 1 tablet by mouth daily.      Marland Kitchen. oxyCODONE (OXY IR/ROXICODONE) 5 MG immediate release tablet Take 1-2 tablets (5-10 mg total) by mouth every 6 (six) hours as needed.  15 tablet  0  . ibuprofen (ADVIL,MOTRIN) 400 MG tablet Take 2 tablets (800 mg total) by mouth every 6 (six) hours as needed.  30 tablet  0  . levonorgestrel-ethinyl estradiol (QUASENSE) 0.15-0.03 MG tablet Take 1 tablet by mouth daily.      . psyllium (REGULOID) 0.52 G capsule Take 0.52 g by mouth daily.       No current facility-administered medications on file prior to visit.     REVIEW OF SYSTEMS: Please see history of present illness  PHYSICAL EXAMINATION: General: The patient appears their stated age.  Vital signs are BP 168/98  Pulse 81  Resp 18  Ht 5\' 4"  (1.626 m)  Wt 151 lb 1.6 oz (68.539 kg)  BMI 25.92 kg/m2 HEENT:  No gross abnormalities Pulmonary: Respirations are non-labored Musculoskeletal: There are no major deformities.   Neurologic: No focal weakness or paresthesias are detected, Skin: There are no ulcer or rashes noted. Psychiatric: The patient has normal affect. Cardiovascular: There is a regular rate and rhythm without significant murmur appreciated.  Positive right hand swelling.  Palpable radial and brachial pulse.  No change in the pulse width elevation of her arm over her head.  Diagnostic Studies: Ultrasound has been ordered and reviewed.   There is no evidence of arterial or venous pathology  Assessment:  Right arm pain and hand swelling Plan: I discussed the ultrasound findings with with the patient.  I initially thought that she may be having symptoms related to thoracic outlet syndrome, however she has no change in her pulse width arm abduction in addition, her swelling is only in her hand.  There is no venous or arterial pathology seen on ultrasound.  Therefore, I feel that it is unlikely that the patient's complaints and symptoms are related to vascular problems.  This could be carpal tunnel vs. another orthopedic problem or maybe even partially related to her bulging disc in her cervical spine.  I did make arrangements today to get the patient assistance from a financial perspective.  I will  refer her back to her primary referring physicians for further management.  Again I do not think there is a vascular etiology for her symptoms.     Jorge Ny, M.D. [facility] Office: [phone] Pager:  [phone]

## 2014-11-24 ENCOUNTER — Encounter (HOSPITAL_COMMUNITY): Payer: Self-pay | Admitting: Physical Medicine and Rehabilitation

## 2014-11-24 ENCOUNTER — Emergency Department (HOSPITAL_COMMUNITY)
Admission: EM | Admit: 2014-11-24 | Discharge: 2014-11-24 | Disposition: A | Discharge status: Home / Self Care | Payer: 59 | Attending: Emergency Medicine | Admitting: Emergency Medicine

## 2014-11-24 ENCOUNTER — Emergency Department (HOSPITAL_COMMUNITY): Payer: 59

## 2014-11-24 DIAGNOSIS — R748 Abnormal levels of other serum enzymes: Secondary | ICD-10-CM | POA: Diagnosis not present

## 2014-11-24 DIAGNOSIS — R1011 Right upper quadrant pain: Secondary | ICD-10-CM | POA: Diagnosis not present

## 2014-11-24 DIAGNOSIS — Z880 Allergy status to penicillin: Secondary | ICD-10-CM | POA: Diagnosis not present

## 2014-11-24 DIAGNOSIS — K5900 Constipation, unspecified: Secondary | ICD-10-CM | POA: Insufficient documentation

## 2014-11-24 DIAGNOSIS — Z8639 Personal history of other endocrine, nutritional and metabolic disease: Secondary | ICD-10-CM | POA: Insufficient documentation

## 2014-11-24 DIAGNOSIS — Z793 Long term (current) use of hormonal contraceptives: Secondary | ICD-10-CM | POA: Insufficient documentation

## 2014-11-24 DIAGNOSIS — R112 Nausea with vomiting, unspecified: Secondary | ICD-10-CM | POA: Diagnosis present

## 2014-11-24 DIAGNOSIS — Z79899 Other long term (current) drug therapy: Secondary | ICD-10-CM | POA: Diagnosis not present

## 2014-11-24 DIAGNOSIS — R110 Nausea: Secondary | ICD-10-CM

## 2014-11-24 LAB — COMPREHENSIVE METABOLIC PANEL
ALK PHOS: 46 U/L (ref 39–117)
ALT: 146 U/L — AB (ref 0–35)
AST: 85 U/L — ABNORMAL HIGH (ref 0–37)
Albumin: 3.8 g/dL (ref 3.5–5.2)
Anion gap: 12 (ref 5–15)
BILIRUBIN TOTAL: 0.5 mg/dL (ref 0.3–1.2)
BUN: 6 mg/dL (ref 6–23)
CO2: 26 mmol/L (ref 19–32)
Calcium: 9.3 mg/dL (ref 8.4–10.5)
Chloride: 98 mEq/L (ref 96–112)
Creatinine, Ser: 0.75 mg/dL (ref 0.50–1.10)
GFR calc Af Amer: >90 mL/min (ref >90)
GFR calc non Af Amer: >90 mL/min (ref >90)
Glucose, Bld: 87 mg/dL (ref 70–99)
POTASSIUM: 3.3 mmol/L — AB (ref 3.5–5.1)
SODIUM: 136 mmol/L (ref 135–145)
TOTAL PROTEIN: 7.3 g/dL (ref 6.0–8.3)

## 2014-11-24 LAB — URINE MICROSCOPIC-ADD ON

## 2014-11-24 LAB — URINALYSIS, ROUTINE W REFLEX MICROSCOPIC
BILIRUBIN URINE: NEGATIVE
Glucose, UA: NEGATIVE mg/dL
HGB URINE DIPSTICK: NEGATIVE
Ketones, ur: 15 mg/dL — AB
NITRITE: NEGATIVE
PH: 6 (ref 5.0–8.0)
Protein, ur: NEGATIVE mg/dL
SPECIFIC GRAVITY, URINE: 1.013 (ref 1.005–1.030)
UROBILINOGEN UA: 0.2 mg/dL (ref 0.0–1.0)

## 2014-11-24 LAB — LIPASE, BLOOD: Lipase: 27 U/L (ref 11–59)

## 2014-11-24 LAB — CBC WITH DIFFERENTIAL/PLATELET
Basophils Absolute: 0 10*3/uL (ref 0.0–0.1)
Basophils Relative: 1 % (ref 0–1)
EOS PCT: 1 % (ref 0–5)
Eosinophils Absolute: 0.1 10*3/uL (ref 0.0–0.7)
HCT: 39 % (ref 36.0–46.0)
Hemoglobin: 13.1 g/dL (ref 12.0–15.0)
LYMPHS ABS: 2.2 10*3/uL (ref 0.7–4.0)
Lymphocytes Relative: 28 % (ref 12–46)
MCH: 29.8 pg (ref 26.0–34.0)
MCHC: 33.6 g/dL (ref 30.0–36.0)
MCV: 88.6 fL (ref 78.0–100.0)
Monocytes Absolute: 0.6 10*3/uL (ref 0.1–1.0)
Monocytes Relative: 7 % (ref 3–12)
Neutro Abs: 4.8 10*3/uL (ref 1.7–7.7)
Neutrophils Relative %: 63 % (ref 43–77)
PLATELETS: 296 10*3/uL (ref 150–400)
RBC: 4.4 MIL/uL (ref 3.87–5.11)
RDW: 12.7 % (ref 11.5–15.5)
WBC: 7.7 10*3/uL (ref 4.0–10.5)

## 2014-11-24 LAB — I-STAT TROPONIN, ED: TROPONIN I, POC: 0 ng/mL (ref 0.00–0.08)

## 2014-11-24 LAB — I-STAT CREATININE, ED: Creatinine, Ser: 0.8 mg/dL (ref 0.50–1.10)

## 2014-11-24 MED ORDER — ONDANSETRON 8 MG PO TBDP: 8.0000 mg | ORAL_TABLET | Freq: Three times a day (TID) | ORAL | Status: DC | PRN

## 2014-11-24 MED ORDER — ONDANSETRON HCL 4 MG/2ML IJ SOLN
4.0000 mg | Freq: Once | INTRAMUSCULAR | Status: AC
  Administered 2014-11-24: 4 mg via INTRAVENOUS
  Filled 2014-11-24: qty 2

## 2014-11-24 MED ORDER — SODIUM CHLORIDE 0.9 % IV BOLUS (SEPSIS)
1000.0000 mL | Freq: Once | INTRAVENOUS | Status: AC
Start: 2014-11-24 — End: 2014-11-24
  Administered 2014-11-24: 1000 mL via INTRAVENOUS

## 2014-11-24 MED ORDER — OXYCODONE-ACETAMINOPHEN 5-325 MG PO TABS
1.0000 | ORAL_TABLET | Freq: Once | ORAL | Status: AC
  Administered 2014-11-24: 1 via ORAL
  Filled 2014-11-24: qty 1

## 2014-11-24 NOTE — ED Notes (Signed)
Pt presents to department for evaluation of nausea, vomiting and diffuse abdominal pain. Ongoing x1 month. 5/10 pain upon arrival to ED. Pt is alert and oriented x4.

## 2014-11-25 LAB — HEPATITIS PANEL, ACUTE
HCV Ab: NEGATIVE
Hep A IgM: NONREACTIVE
Hep B C IgM: NONREACTIVE
Hepatitis B Surface Ag: NEGATIVE

## 2014-11-26 NOTE — ED Provider Notes (Signed)
CSN: [id number]     Arrival date & time 11/24/14  1152 History   First MD Initiated Contact with Patient 11/24/14 1213     Chief Complaint  Patient presents with  . Nausea  . Emesis  . Abdominal Pain      HPI Patient reports nausea and vomiting rather consistently over the past month.  She reports diffuse crampy abdominal pain and some upper abdominal discomfort or pain.  She has not seen her primary care doctor during this.  She reports 5 out of 10 pain at this time.  No hematemesis.  No fevers or chills.  No chest pain shortness of breath.  Denies back pain or flank pain.  No urinary complaints.   Past Medical History  Diagnosis Date  . Hyperlipidemia   . 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 Constipation   . Seasonal allergies 08-13-13    tx. Claritin   Past Surgical History  Procedure Laterality Date  . Irrigation and debridement abscess  07/08/2012    Procedure: MINOR INCISION AND DRAINAGE OF ABSCESS;  Surgeon: Geryl Rankins, MD;  Location: [facility];  Service: Gynecology;  Laterality: N/A;  Vulvar Abscess  . Anal fistulectomy  08/22/2012    Procedure: FISTULECTOMY ANAL;  Surgeon: Lodema Pilot, DO;  Location: [facility];  Service: General;  Laterality: N/A;  rectal examination under anesthesia with seton placement   . Examination under anesthesia  09/17/2012    Procedure: EXAM UNDER ANESTHESIA;  Surgeon: Lodema Pilot, DO;  Location: [facility];  Service: General;  Laterality: N/A;  Rectal exam under anesthesia  . Anal fistulectomy  09/17/2012    Procedure: FISTULECTOMY ANAL;  Surgeon: Lodema Pilot, DO;  Location: [facility];  Service: General;  Laterality: N/A;  possible fistulotomy  . Fistula plug  09/17/2012    Procedure: FISTULA PLUG;  Surgeon: Lodema Pilot, DO;  Location: [facility];  Service: General;  Laterality: N/A;  Possible fistula plug  . D & c hysteroscopy/ novasure endometrial ablation/ i & d thrombosed hemorroid   01-10-2007  DR Miguel Aschoff  . Hemorrhoid surgery  04-28-2009    [facility] INTERNAL HEMORRHOIDS  . Examination under anesthesia N/A 05/14/2013    Procedure: EXAM UNDER ANESTHESIA;  Surgeon: Romie Levee, MD;  Location: [facility];  Service: General;  Laterality: N/A;  . Placement of seton N/A 05/14/2013    Procedure:  PLACEMENT OF SETON;  Surgeon: Romie Levee, MD;  Location: [facility];  Service: General;  Laterality: N/A;  . Rectal ultrasound N/A 07/20/2013    Procedure: RECTAL ULTRASOUND;  Surgeon: Romie Levee, MD;  [facility];  Service: Endoscopy;  Laterality: N/A;  . Rectal exam under anesthesia N/A 08/20/2013    Procedure: mucosal advancement flap ;  Surgeon: Romie Levee, MD;  Location: [facility];  Service: General;  Laterality: N/A;  parks anal retractor long rectal instrucments prone jack knife anal fistula    No family history on file. History  Substance Use Topics  . Smoking status: Never Smoker   . Smokeless tobacco: Never Used  . Alcohol Use: No   OB History    No data available     Review of Systems  All other systems reviewed and are negative.     Allergies  Ampicillin; Penicillins; Sulfonamide derivatives; Hydrocodone-acetaminophen; Tylenol; and Tramadol  Home Medications   Prior to Admission medications   Medication Sig Start Date End Date Taking? Authorizing Provider  docusate sodium (COLACE) 100 MG  capsule Take 1 capsule (100 mg total) by mouth 2 (two) times daily. 11/20/13  Yes Romie LeveeAlicia Thomas, MD  ketotifen (ZADITOR) 0.025 % ophthalmic solution Place 1 drop into both eyes daily.   Yes Historical Provider, MD  loratadine (CLARITIN) 10 MG tablet Take 10 mg by mouth daily.   Yes Historical Provider, MD  norethindrone-ethinyl estradiol-iron (MICROGESTIN FE,GILDESS FE,LOESTRIN FE) 1.5-30 MG-MCG tablet Take 1 tablet by mouth daily.   Yes Historical Provider, MD  psyllium (REGULOID) 0.52 G capsule Take 0.52 g by mouth daily.   Yes  Historical Provider, MD  ondansetron (ZOFRAN ODT) 8 MG disintegrating tablet Take 1 tablet (8 mg total) by mouth every 8 (eight) hours as needed for nausea or vomiting. 11/24/14   Lyanne CoKevin M Sylvestre Rathgeber, MD   BP 120/65 mmHg  Pulse 91  Temp(Src) 98.3 F (36.8 C) (Oral)  Resp 16  Ht 5\' 4"  (1.626 m)  Wt 153 lb (69.4 kg)  BMI 26.25 kg/m2  SpO2 100% Physical Exam  Constitutional: She is oriented to person, place, and time. She appears well-developed and well-nourished. No distress.  HENT:  Head: Normocephalic and atraumatic.  Eyes: EOM are normal.  Neck: Normal range of motion.  Cardiovascular: Normal rate, regular rhythm and normal heart sounds.   Pulmonary/Chest: Effort normal and breath sounds normal.  Abdominal: Soft. She exhibits no distension.  Mild upper abdominal tenderness without guarding or rebound.  No focal right upper quadrant tenderness.  Musculoskeletal: Normal range of motion.  Neurological: She is alert and oriented to person, place, and time.  Skin: Skin is warm and dry.  Psychiatric: She has a normal mood and affect. Judgment normal.  Nursing note and vitals reviewed.   ED Course  Procedures (including critical care time) Labs Review Labs Reviewed  COMPREHENSIVE METABOLIC PANEL - Abnormal; Notable for the following:    Potassium 3.3 (*)    AST 85 (*)    ALT 146 (*)    All other components within normal limits  URINALYSIS, ROUTINE W REFLEX MICROSCOPIC - Abnormal; Notable for the following:    APPearance CLOUDY (*)    Ketones, ur 15 (*)    Leukocytes, UA TRACE (*)    All other components within normal limits  URINE MICROSCOPIC-ADD ON - Abnormal; Notable for the following:    Squamous Epithelial / LPF MANY (*)    All other components within normal limits  CBC WITH DIFFERENTIAL  LIPASE, BLOOD  HEPATITIS PANEL, ACUTE  I-STAT CREATININE, ED  I-STAT TROPOININ, ED    Imaging Review Gallbladder: No gallstones or wall thickening visualized. No sonographic Murphy  sign noted.  Common bile duct: Diameter: 3.8 mm.  Liver: No focal lesion identified. Within normal limits in parenchymal echogenicity.  IVC: No abnormality visualized.  Pancreas: Visualized portion unremarkable.  Spleen: Size and appearance within normal limits.  Right Kidney: Length: 10.5 cm. Echogenicity within normal limits. No mass or hydronephrosis visualized.  Left Kidney: Length: 10.4 cm. A 1.8 cm echogenic focus is noted in the midportion of the left kidney consistent with a nonobstructing renal stone. This has increased in size somewhat from the prior exam at which time it measured 1 cm.  Abdominal aorta: No aneurysm visualized.  Other findings: None.  IMPRESSION: Nonobstructing left renal calculus.   EKG Interpretation   Date/Time:  Wednesday November 24 2014 12:34:01 EST Ventricular Rate:  87 PR Interval:  157 QRS Duration: 80 QT Interval:  363 QTC Calculation: 437 R Axis:   52 Text Interpretation:  Sinus rhythm Abnormal  R-wave progression, early  transition No significant change was found Confirmed by Thedore Pickel  MD, Caryn Bee  ([id number]) on 11/24/2014 2:38:41 PM      MDM   Final diagnoses:  Right upper quadrant pain  Nausea  Elevated liver enzymes   nonspecific elevation of her liver function tests.  The patient feels much better at this time.  Plan will be to discharge home with primary care follow-up.  Of instructed the patient to follow-up on her liver function tests and these will need to be repeated.  An acute hepatitis panel was also ordered and the primary care doctor will need to follow-up on this as well.  The patient was informed of these outstanding laboratory studies.  Repeat abdominal exam is without significant tenderness.  No peritoneal signs.  Doubt appendicitis.  Discharge home in good condition.  Vital signs are normal.    Lyanne Co, MD 11/26/14 [phone]

## 2014-11-29 ENCOUNTER — Encounter: Payer: Self-pay | Admitting: Internal Medicine

## 2014-12-01 ENCOUNTER — Emergency Department (HOSPITAL_COMMUNITY): Payer: 59

## 2014-12-01 ENCOUNTER — Encounter (HOSPITAL_COMMUNITY): Payer: Self-pay

## 2014-12-01 ENCOUNTER — Emergency Department (HOSPITAL_COMMUNITY)
Admission: EM | Admit: 2014-12-01 | Discharge: 2014-12-01 | Disposition: A | Discharge status: Home / Self Care | Payer: 59 | Attending: Emergency Medicine | Admitting: Emergency Medicine

## 2014-12-01 DIAGNOSIS — R1011 Right upper quadrant pain: Secondary | ICD-10-CM | POA: Diagnosis not present

## 2014-12-01 DIAGNOSIS — Z79899 Other long term (current) drug therapy: Secondary | ICD-10-CM | POA: Insufficient documentation

## 2014-12-01 DIAGNOSIS — R112 Nausea with vomiting, unspecified: Secondary | ICD-10-CM | POA: Diagnosis not present

## 2014-12-01 DIAGNOSIS — Z880 Allergy status to penicillin: Secondary | ICD-10-CM | POA: Insufficient documentation

## 2014-12-01 DIAGNOSIS — Z793 Long term (current) use of hormonal contraceptives: Secondary | ICD-10-CM | POA: Insufficient documentation

## 2014-12-01 DIAGNOSIS — Z3202 Encounter for pregnancy test, result negative: Secondary | ICD-10-CM | POA: Diagnosis not present

## 2014-12-01 DIAGNOSIS — R1110 Vomiting, unspecified: Secondary | ICD-10-CM | POA: Diagnosis present

## 2014-12-01 DIAGNOSIS — R799 Abnormal finding of blood chemistry, unspecified: Secondary | ICD-10-CM | POA: Diagnosis not present

## 2014-12-01 DIAGNOSIS — Z8639 Personal history of other endocrine, nutritional and metabolic disease: Secondary | ICD-10-CM | POA: Insufficient documentation

## 2014-12-01 DIAGNOSIS — K5900 Constipation, unspecified: Secondary | ICD-10-CM | POA: Diagnosis not present

## 2014-12-01 DIAGNOSIS — R109 Unspecified abdominal pain: Secondary | ICD-10-CM

## 2014-12-01 LAB — CBC WITH DIFFERENTIAL/PLATELET
BASOS ABS: 0 10*3/uL (ref 0.0–0.1)
BASOS PCT: 0 % (ref 0–1)
Eosinophils Absolute: 0 10*3/uL (ref 0.0–0.7)
Eosinophils Relative: 0 % (ref 0–5)
HCT: 41 % (ref 36.0–46.0)
Hemoglobin: 14.1 g/dL (ref 12.0–15.0)
Lymphocytes Relative: 33 % (ref 12–46)
Lymphs Abs: 2.9 10*3/uL (ref 0.7–4.0)
MCH: 30.5 pg (ref 26.0–34.0)
MCHC: 34.4 g/dL (ref 30.0–36.0)
MCV: 88.7 fL (ref 78.0–100.0)
Monocytes Absolute: 0.5 10*3/uL (ref 0.1–1.0)
Monocytes Relative: 6 % (ref 3–12)
Neutro Abs: 5.3 10*3/uL (ref 1.7–7.7)
Neutrophils Relative %: 61 % (ref 43–77)
PLATELETS: 344 10*3/uL (ref 150–400)
RBC: 4.62 MIL/uL (ref 3.87–5.11)
RDW: 12.9 % (ref 11.5–15.5)
WBC: 8.8 10*3/uL (ref 4.0–10.5)

## 2014-12-01 LAB — COMPREHENSIVE METABOLIC PANEL
ALT: 238 U/L — ABNORMAL HIGH (ref 0–35)
AST: 125 U/L — ABNORMAL HIGH (ref 0–37)
Albumin: 4 g/dL (ref 3.5–5.2)
Alkaline Phosphatase: 48 U/L (ref 39–117)
Anion gap: 10 (ref 5–15)
BUN: 7 mg/dL (ref 6–23)
CHLORIDE: 102 mmol/L (ref 96–112)
CO2: 24 mmol/L (ref 19–32)
Calcium: 9.1 mg/dL (ref 8.4–10.5)
Creatinine, Ser: 0.82 mg/dL (ref 0.50–1.10)
GFR calc Af Amer: >90 mL/min (ref >90)
GFR calc non Af Amer: 82 mL/min — ABNORMAL LOW (ref >90)
Glucose, Bld: 89 mg/dL (ref 70–99)
POTASSIUM: 3.7 mmol/L (ref 3.5–5.1)
Sodium: 136 mmol/L (ref 135–145)
Total Bilirubin: 0.6 mg/dL (ref 0.3–1.2)
Total Protein: 7.9 g/dL (ref 6.0–8.3)

## 2014-12-01 LAB — URINALYSIS, ROUTINE W REFLEX MICROSCOPIC
Glucose, UA: NEGATIVE mg/dL
Hgb urine dipstick: NEGATIVE
Ketones, ur: 15 mg/dL — AB
Nitrite: NEGATIVE
PH: 5.5 (ref 5.0–8.0)
PROTEIN: 30 mg/dL — AB
SPECIFIC GRAVITY, URINE: 1.027 (ref 1.005–1.030)
Urobilinogen, UA: 0.2 mg/dL (ref 0.0–1.0)

## 2014-12-01 LAB — URINE MICROSCOPIC-ADD ON

## 2014-12-01 LAB — POC URINE PREG, ED: PREG TEST UR: NEGATIVE

## 2014-12-01 MED ORDER — SODIUM CHLORIDE 0.9 % IV SOLN
Freq: Once | INTRAVENOUS | Status: AC
  Administered 2014-12-01: 18:00:00 via INTRAVENOUS

## 2014-12-01 MED ORDER — IOHEXOL 300 MG/ML  SOLN
100.0000 mL | Freq: Once | INTRAMUSCULAR | Status: AC | PRN
  Administered 2014-12-01: 100 mL via INTRAVENOUS

## 2014-12-01 MED ORDER — IOHEXOL 300 MG/ML  SOLN
25.0000 mL | Freq: Once | INTRAMUSCULAR | Status: AC | PRN
  Administered 2014-12-01: 25 mL via ORAL

## 2014-12-01 MED ORDER — BENZONATATE 100 MG PO CAPS: 200.0000 mg | ORAL_CAPSULE | Freq: Two times a day (BID) | ORAL | Status: DC | PRN

## 2014-12-01 MED ORDER — OXYCODONE-ACETAMINOPHEN 5-325 MG PO TABS: 2.0000 | ORAL_TABLET | Freq: Four times a day (QID) | ORAL | Status: DC | PRN

## 2014-12-01 MED ORDER — ONDANSETRON 4 MG PO TBDP
8.0000 mg | ORAL_TABLET | Freq: Once | ORAL | Status: AC
  Administered 2014-12-01: 8 mg via ORAL
  Filled 2014-12-01: qty 2

## 2014-12-01 NOTE — ED Notes (Signed)
Patient transported to Ultrasound 

## 2014-12-01 NOTE — ED Notes (Addendum)
Pt states she was seen here a week ago for nausea and vomiting since around December 15th. Unable to see GI doctor till march, states she is constantly gagging but is not actually vomiting and has been able to keep down fluids and food. Denies diarrhea, states some intermittent abd pain under rt ribs. Pt alert, oriented,nad

## 2014-12-01 NOTE — Discharge Instructions (Signed)
Abdominal Pain °Many things can cause abdominal pain. Usually, abdominal pain is not caused by a disease and will improve without treatment. It can often be observed and treated at home. Your health care provider will do a physical exam and possibly order blood tests and X-rays to help determine the seriousness of your pain. However, in many cases, more time must pass before a clear cause of the pain can be found. Before that point, your health care provider may not know if you need more testing or further treatment. °HOME CARE INSTRUCTIONS  °Monitor your abdominal pain for any changes. The following actions may help to alleviate any discomfort you are experiencing: °· Only take over-the-counter or prescription medicines as directed by your health care provider. °· Do not take laxatives unless directed to do so by your health care provider. °· Try a clear liquid diet (broth, tea, or water) as directed by your health care provider. Slowly move to a bland diet as tolerated. °SEEK MEDICAL CARE IF: °· You have unexplained abdominal pain. °· You have abdominal pain associated with nausea or diarrhea. °· You have pain when you urinate or have a bowel movement. °· You experience abdominal pain that wakes you in the night. °· You have abdominal pain that is worsened or improved by eating food. °· You have abdominal pain that is worsened with eating fatty foods. °· You have a fever. °SEEK IMMEDIATE MEDICAL CARE IF:  °· Your pain does not go away within 2 hours. °· You keep throwing up (vomiting). °· Your pain is felt only in portions of the abdomen, such as the right side or the left lower portion of the abdomen. °· You pass bloody or black tarry stools. °MAKE SURE YOU: °· Understand these instructions.   °· Will watch your condition.   °· Will get help right away if you are not doing well or get worse.   °Document Released: 08/01/2005 Document Revised: 10/27/2013 Document Reviewed: 07/01/2013 °Exit[employer]® Patient Information  ©2015 [employer]. This information is not intended to replace advice given to you by your health care provider. Make sure you discuss any questions you have with your health care provider. ° °Nausea and Vomiting °Nausea is a sick feeling that often comes before throwing up (vomiting). Vomiting is a reflex where stomach contents come out of your mouth. Vomiting can cause severe loss of body fluids (dehydration). Children and elderly adults can become dehydrated quickly, especially if they also have diarrhea. Nausea and vomiting are symptoms of a condition or disease. It is important to find the cause of your symptoms. °CAUSES  °· Direct irritation of the stomach lining. This irritation can result from increased acid production (gastroesophageal reflux disease), infection, food poisoning, taking certain medicines (such as nonsteroidal anti-inflammatory drugs), alcohol use, or tobacco use. °· Signals from the brain. These signals could be caused by a headache, heat exposure, an inner ear disturbance, increased pressure in the brain from injury, infection, a tumor, or a concussion, pain, emotional stimulus, or metabolic problems. °· An obstruction in the gastrointestinal tract (bowel obstruction). °· Illnesses such as diabetes, hepatitis, gallbladder problems, appendicitis, kidney problems, cancer, sepsis, atypical symptoms of a heart attack, or eating disorders. °· Medical treatments such as chemotherapy and radiation. °· Receiving medicine that makes you sleep (general anesthetic) during surgery. °DIAGNOSIS °Your caregiver may ask for tests to be done if the problems do not improve after a few days. Tests may also be done if symptoms are severe or if the reason for the nausea   and vomiting is not clear. Tests may include: °· Urine tests. °· Blood tests. °· Stool tests. °· Cultures (to look for evidence of infection). °· X-rays or other imaging studies. °Test results can help your caregiver make decisions about  treatment or the need for additional tests. °TREATMENT °You need to stay well hydrated. Drink frequently but in small amounts. You may wish to drink water, sports drinks, clear broth, or eat frozen ice pops or gelatin dessert to help stay hydrated. When you eat, eating slowly may help prevent nausea. There are also some antinausea medicines that may help prevent nausea. °HOME CARE INSTRUCTIONS  °· Take all medicine as directed by your caregiver. °· If you do not have an appetite, do not force yourself to eat. However, you must continue to drink fluids. °· If you have an appetite, eat a normal diet unless your caregiver tells you differently. °¨ Eat a variety of complex carbohydrates (rice, wheat, potatoes, bread), lean meats, yogurt, fruits, and vegetables. °¨ Avoid high-fat foods because they are more difficult to digest. °· Drink enough water and fluids to keep your urine clear or pale yellow. °· If you are dehydrated, ask your caregiver for specific rehydration instructions. Signs of dehydration may include: °¨ Severe thirst. °¨ Dry lips and mouth. °¨ Dizziness. °¨ Dark urine. °¨ Decreasing urine frequency and amount. °¨ Confusion. °¨ Rapid breathing or pulse. °SEEK IMMEDIATE MEDICAL CARE IF:  °· You have blood or brown flecks (like coffee grounds) in your vomit. °· You have black or bloody stools. °· You have a severe headache or stiff neck. °· You are confused. °· You have severe abdominal pain. °· You have chest pain or trouble breathing. °· You do not urinate at least once every 8 hours. °· You develop cold or clammy skin. °· You continue to vomit for longer than 24 to 48 hours. °· You have a fever. °MAKE SURE YOU:  °· Understand these instructions. °· Will watch your condition. °· Will get help right away if you are not doing well or get worse. °Document Released: 10/22/2005 Document Revised: 01/14/2012 Document Reviewed: 03/21/2011 °ExitCare® Patient Information ©2015 [employer]. This information is not  intended to replace advice given to you by your health care provider. Make sure you discuss any questions you have with your health care provider. ° °

## 2014-12-01 NOTE — ED Provider Notes (Signed)
CSN: [id number]     Arrival date & time 12/01/14  1212 History   First MD Initiated Contact with Patient 12/01/14 [phone]     Chief Complaint  Patient presents with  . Vomiting     (Consider location/radiation/quality/duration/timing/severity/associated sxs/prior Treatment) HPI Comments: Patient with past medical history of hyperlipidemia, presents emergency department with chief complaint of right upper quadrant pain and associated vomiting times several weeks. She has been seen here multiple times for nausea and vomiting. She states that it is worsened with using, especially fried foods. She denies any fevers or chills. Denies any chest pain, shortness breath, diarrhea, constipation. She has not tried taking anything to alleviate her symptoms. She states that when the pain comes it is a sharp stabbing pain in her right upper quadrant but sometimes radiates around her ribs. Currently she denies any pain. She does have persistent nausea with some vomiting.  The history is provided by the patient. No language interpreter was used.    Past Medical History  Diagnosis Date  . Hyperlipidemia   . 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 Constipation   . Seasonal allergies 08-13-13    tx. Claritin   Past Surgical History  Procedure Laterality Date  . Irrigation and debridement abscess  07/08/2012    Procedure: MINOR INCISION AND DRAINAGE OF ABSCESS;  Surgeon: Geryl Rankins, MD;  Location: [facility];  Service: Gynecology;  Laterality: N/A;  Vulvar Abscess  . Anal fistulectomy  08/22/2012    Procedure: FISTULECTOMY ANAL;  Surgeon: Lodema Pilot, DO;  Location: [facility];  Service: General;  Laterality: N/A;  rectal examination under anesthesia with seton placement   . Examination under anesthesia  09/17/2012    Procedure: EXAM UNDER ANESTHESIA;  Surgeon: Lodema Pilot, DO;  Location: [facility];  Service: General;  Laterality: N/A;  Rectal exam under anesthesia  . Anal fistulectomy   09/17/2012    Procedure: FISTULECTOMY ANAL;  Surgeon: Lodema Pilot, DO;  Location: [facility];  Service: General;  Laterality: N/A;  possible fistulotomy  . Fistula plug  09/17/2012    Procedure: FISTULA PLUG;  Surgeon: Lodema Pilot, DO;  Location: [facility];  Service: General;  Laterality: N/A;  Possible fistula plug  . D & c hysteroscopy/ novasure endometrial ablation/ i & d thrombosed hemorroid  01-10-2007  DR Miguel Aschoff  . Hemorrhoid surgery  04-28-2009    [facility] INTERNAL HEMORRHOIDS  . Examination under anesthesia N/A 05/14/2013    Procedure: EXAM UNDER ANESTHESIA;  Surgeon: Romie Levee, MD;  Location: [facility];  Service: General;  Laterality: N/A;  . Placement of seton N/A 05/14/2013    Procedure:  PLACEMENT OF SETON;  Surgeon: Romie Levee, MD;  Location: [facility];  Service: General;  Laterality: N/A;  . Rectal ultrasound N/A 07/20/2013    Procedure: RECTAL ULTRASOUND;  Surgeon: Romie Levee, MD;  Location: [facility];  Service: Endoscopy;  Laterality: N/A;  . Rectal exam under anesthesia N/A 08/20/2013    Procedure: mucosal advancement flap ;  Surgeon: Romie Levee, MD;  Location: [facility];  Service: General;  Laterality: N/A;  parks anal retractor long rectal instrucments prone jack knife anal fistula    History reviewed. No pertinent family history. History  Substance Use Topics  . Smoking status: Never Smoker   . Smokeless tobacco: Never Used  . Alcohol Use: No   OB History    No data available     Review of Systems  Constitutional:  Negative for fever and chills.  Respiratory: Negative for shortness of breath.   Cardiovascular: Negative for chest pain.  Gastrointestinal: Positive for nausea, vomiting and abdominal pain. Negative for diarrhea and constipation.  Genitourinary: Negative for dysuria.  All other systems reviewed and are negative.     Allergies  Ampicillin; Penicillins; Sulfonamide  derivatives; Hydrocodone-acetaminophen; Tylenol; and Tramadol  Home Medications   Prior to Admission medications   Medication Sig Start Date End Date Taking? Authorizing Provider  docusate sodium (COLACE) 100 MG capsule Take 1 capsule (100 mg total) by mouth 2 (two) times daily. 11/20/13   Romie Levee, MD  ketotifen (ZADITOR) 0.025 % ophthalmic solution Place 1 drop into both eyes daily.    Historical Provider, MD  loratadine (CLARITIN) 10 MG tablet Take 10 mg by mouth daily.    Historical Provider, MD  norethindrone-ethinyl estradiol-iron (MICROGESTIN FE,GILDESS FE,LOESTRIN FE) 1.5-30 MG-MCG tablet Take 1 tablet by mouth daily.    Historical Provider, MD  ondansetron (ZOFRAN ODT) 8 MG disintegrating tablet Take 1 tablet (8 mg total) by mouth every 8 (eight) hours as needed for nausea or vomiting. 11/24/14   Lyanne Co, MD  psyllium (REGULOID) 0.52 G capsule Take 0.52 g by mouth daily.    Historical Provider, MD   BP 134/77 mmHg  Pulse 86  Temp(Src) 98.3 F (36.8 C) (Oral)  Resp 20  SpO2 100% Physical Exam  Constitutional: She is oriented to person, place, and time. She appears well-developed and well-nourished.  HENT:  Head: Normocephalic and atraumatic.  Eyes: Conjunctivae and EOM are normal. Pupils are equal, round, and reactive to light.  Neck: Normal range of motion. Neck supple.  Cardiovascular: Normal rate and regular rhythm.  Exam reveals no gallop and no friction rub.   No murmur heard. Pulmonary/Chest: Effort normal and breath sounds normal. No respiratory distress. She has no wheezes. She has no rales. She exhibits no tenderness.  Abdominal: Soft. Bowel sounds are normal. She exhibits no distension and no mass. There is no tenderness. There is no rebound and no guarding.  No focal abdominal tenderness, no RLQ tenderness or pain at McBurney's point, no RUQ tenderness or Murphy's sign, no left-sided abdominal tenderness, no fluid wave, or signs of peritonitis    Musculoskeletal: Normal range of motion. She exhibits no edema or tenderness.  Neurological: She is alert and oriented to person, place, and time.  Skin: Skin is warm and dry.  Psychiatric: She has a normal mood and affect. Her behavior is normal. Judgment and thought content normal.  Nursing note and vitals reviewed.   ED Course  Procedures (including critical care time) Results for orders placed or performed during the hospital encounter of 12/01/14  CBC with Differential  Result Value Ref Range   WBC 8.8 4.0 - 10.5 K/uL   RBC 4.62 3.87 - 5.11 MIL/uL   Hemoglobin 14.1 12.0 - 15.0 g/dL   HCT 91.4 78.2 - 95.6 %   MCV 88.7 78.0 - 100.0 fL   MCH 30.5 26.0 - 34.0 pg   MCHC 34.4 30.0 - 36.0 g/dL   RDW 21.3 08.6 - 57.8 %   Platelets 344 150 - 400 K/uL   Neutrophils Relative % 61 43 - 77 %   Neutro Abs 5.3 1.7 - 7.7 K/uL   Lymphocytes Relative 33 12 - 46 %   Lymphs Abs 2.9 0.7 - 4.0 K/uL   Monocytes Relative 6 3 - 12 %   Monocytes Absolute 0.5 0.1 - 1.0 K/uL  Eosinophils Relative 0 0 - 5 %   Eosinophils Absolute 0.0 0.0 - 0.7 K/uL   Basophils Relative 0 0 - 1 %   Basophils Absolute 0.0 0.0 - 0.1 K/uL  Comprehensive metabolic panel  Result Value Ref Range   Sodium 136 135 - 145 mmol/L   Potassium 3.7 3.5 - 5.1 mmol/L   Chloride 102 96 - 112 mmol/L   CO2 24 19 - 32 mmol/L   Glucose, Bld 89 70 - 99 mg/dL   BUN 7 6 - 23 mg/dL   Creatinine, Ser 1.61 0.50 - 1.10 mg/dL   Calcium 9.1 8.4 - 09.6 mg/dL   Total Protein 7.9 6.0 - 8.3 g/dL   Albumin 4.0 3.5 - 5.2 g/dL   AST 045 (H) 0 - 37 U/L   ALT 238 (H) 0 - 35 U/L   Alkaline Phosphatase 48 39 - 117 U/L   Total Bilirubin 0.6 0.3 - 1.2 mg/dL   GFR calc non Af Amer 82 (L) >90 mL/min   GFR calc Af Amer >90 >90 mL/min   Anion gap 10 5 - 15  Urinalysis, Routine w reflex microscopic  Result Value Ref Range   Color, Urine AMBER (A) YELLOW   APPearance CLOUDY (A) CLEAR   Specific Gravity, Urine 1.027 1.005 - 1.030   pH 5.5 5.0 - 8.0    Glucose, UA NEGATIVE NEGATIVE mg/dL   Hgb urine dipstick NEGATIVE NEGATIVE   Bilirubin Urine SMALL (A) NEGATIVE   Ketones, ur 15 (A) NEGATIVE mg/dL   Protein, ur 30 (A) NEGATIVE mg/dL   Urobilinogen, UA 0.2 0.0 - 1.0 mg/dL   Nitrite NEGATIVE NEGATIVE   Leukocytes, UA SMALL (A) NEGATIVE  Urine microscopic-add on  Result Value Ref Range   Squamous Epithelial / LPF MANY (A) RARE   WBC, UA 3-6 <3 WBC/hpf   RBC / HPF 0-2 <3 RBC/hpf   Bacteria, UA RARE RARE   Urine-Other MUCOUS PRESENT   POC urine preg, ED (not at [facility])  Result Value Ref Range   Preg Test, Ur NEGATIVE NEGATIVE   US Abdomen Complete  11/24/2014   CLINICAL DATA:  Right upper quadrant pain  EXAM: ULTRASOUND ABDOMEN COMPLETE  COMPARISON:  09/28/2012  FINDINGS: Gallbladder: No gallstones or wall thickening visualized. No sonographic Murphy sign noted.  Common bile duct: Diameter: 3.8 mm.  Liver: No focal lesion identified. Within normal limits in parenchymal echogenicity.  IVC: No abnormality visualized.  Pancreas: Visualized portion unremarkable.  Spleen: Size and appearance within normal limits.  Right Kidney: Length: 10.5 cm. Echogenicity within normal limits. No mass or hydronephrosis visualized.  Left Kidney: Length: 10.4 cm. A 1.8 cm echogenic focus is noted in the midportion of the left kidney consistent with a nonobstructing renal stone. This has increased in size somewhat from the prior exam at which time it measured 1 cm.  Abdominal aorta: No aneurysm visualized.  Other findings: None.  IMPRESSION: Nonobstructing left renal calculus.   Electronically Signed   By: Alcide Clever M.D.   On: 11/24/2014 15:54   Ct Abdomen Pelvis W Contrast  12/01/2014   CLINICAL DATA:  Nausea, vomiting for 1 month  EXAM: CT ABDOMEN AND PELVIS WITH CONTRAST  TECHNIQUE: Multidetector CT imaging of the abdomen and pelvis was performed using the standard protocol following bolus administration of intravenous contrast.  CONTRAST:  OMNIPAQUE  IOHEXOL 300 MG/ML  SOLN  COMPARISON:  None.  FINDINGS: The lung bases are clear.  The liver demonstrates no focal abnormality. There is  no intrahepatic or extrahepatic biliary ductal dilatation. The gallbladder is normal. The spleen demonstrates no focal abnormality.There is a 12 x 6 mm nonobstructing left renal calculus. The right kidney, adrenal glands and pancreas are normal. The bladder is unremarkable.  The stomach, duodenum, small intestine, and large intestine demonstrate no bowel dilatation. There is a loop of jejunum (image 41/ series 201) with bowel wall thickening and mild haziness in the surrounding fat which may reflect mild enteritis secondary to an infectious or inflammatory etiology. There is a normal caliber appendix in the right lower quadrant without periappendiceal inflammatory changes. There is no pneumoperitoneum, pneumatosis, or portal venous gas. There is a moderate amount of pelvic free fluid. There is no lymphadenopathy.  The abdominal aorta is normal in caliber.  There are no lytic or sclerotic osseous lesions.  IMPRESSION: 1. Mild relative jejunal wall thickening and mild haziness in the surrounding fat concerning for enteritis secondary to an infectious or inflammatory etiology. 2. Nonobstructing left renal calculus.   Electronically Signed   By: Elige KoHetal  Patel   On: 12/01/2014 19:19   [place] Abdomen Limited  12/01/2014   CLINICAL DATA:  Intermittent RIGHT upper quadrant pain, vomiting since 10/19/2014  EXAM: US ABDOMEN LIMITED - RIGHT UPPER QUADRANT  COMPARISON:  Ultrasound abdomen 11/24/2014  FINDINGS: Gallbladder:  Normally distended without stones or wall thickening.  No pericholecystic fluid or sonographic Murphy sign.  Common bile duct:  Diameter: Normal caliber 2 mm diameter  Liver:  Normal appearance  No RIGHT upper quadrant free fluid.  IMPRESSION: Normal exam.   Electronically Signed   By: Ulyses SouthwardMark  Boles M.D.   On: 12/01/2014 16:52      EKG Interpretation None      MDM    Final diagnoses:  RUQ pain  Abdominal pain  Abdominal pain    Patient with right upper quadrant pain, persistent nausea and vomiting. Suspect gallbladder disease. Patient has had new increase in LFTs. I will repeat an abdominal ultrasound. Will give fluids, treat symptoms the emergency department. Will reassess.  Ultrasound was negative, however given the amount of vomiting that the patient has had, fill it is necessary to order a CAT scan of her abdomen for further evaluation. If the CT scan is negative, will consider hepatitis panel, and outpatient follow-up.  7:54 PM CT remarkable for possible enteritis. Patient has nausea medicine at home. I will give her a little bit of pain medicine. Patient discussed with Dr. Romeo AppleHarrison, who agrees the plan. Plan for primary care follow-up, and repeat LFTs, consider hepatitis screen.    Roxy Horsemanobert Eaven Schwager, PA-C 12/01/14 1955  Purvis SheffieldForrest Harrison, MD 12/02/14 1224

## 2014-12-01 NOTE — ED Notes (Signed)
Patient finished with contrast 

## 2014-12-01 NOTE — ED Notes (Signed)
Pt here for nausea and vomiting x 1 month.  Sts she was seen and evaluated for same a week ago.  Still continuing to have abdominal pain and emesis.  Unable to see PCP until March.

## 2015-01-11 ENCOUNTER — Ambulatory Visit (INDEPENDENT_AMBULATORY_CARE_PROVIDER_SITE_OTHER): Payer: 59 | Admitting: Neurology

## 2015-01-11 ENCOUNTER — Encounter: Payer: Self-pay | Admitting: Neurology

## 2015-01-11 ENCOUNTER — Ambulatory Visit (INDEPENDENT_AMBULATORY_CARE_PROVIDER_SITE_OTHER): Payer: Self-pay | Admitting: Neurology

## 2015-01-11 DIAGNOSIS — R209 Unspecified disturbances of skin sensation: Secondary | ICD-10-CM

## 2015-01-11 DIAGNOSIS — M25521 Pain in right elbow: Secondary | ICD-10-CM

## 2015-01-11 DIAGNOSIS — M79621 Pain in right upper arm: Secondary | ICD-10-CM

## 2015-01-11 DIAGNOSIS — M79601 Pain in right arm: Secondary | ICD-10-CM

## 2015-01-11 NOTE — Procedures (Signed)
     HISTORY:  [name] is a 49 year old patient with a history of right hand and arm and shoulder discomfort since March 2015. The patient also has some neck discomfort associated with the above symptoms. She feels a sensation of swelling in the right hand. She is being evaluated for the chronic pain issue.  NERVE CONDUCTION STUDIES:  Nerve conduction studies were performed on the right upper extremity. The distal motor latencies and motor amplitudes for the median and ulnar nerves were within normal limits. The F wave latencies and nerve conduction velocities for these nerves were also normal. The sensory latencies for the median and ulnar nerves were normal.   EMG STUDIES:  EMG study was performed on the right upper extremity:  The first dorsal interosseous muscle reveals 2 to 4 K units with full recruitment. No fibrillations or positive waves were noted. The abductor pollicis brevis muscle reveals 2 to 4 K units with full recruitment. No fibrillations or positive waves were noted. The extensor indicis proprius muscle reveals 1 to 3 K units with full recruitment. No fibrillations or positive waves were noted. The pronator teres muscle reveals 2 to 3 K units with full recruitment. No fibrillations or positive waves were noted. The biceps muscle reveals 1 to 2 K units with full recruitment. No fibrillations or positive waves were noted. The triceps muscle reveals 2 to 4 K units with full recruitment. No fibrillations or positive waves were noted. The anterior deltoid muscle reveals 2 to 3 K units with full recruitment. No fibrillations or positive waves were noted. The cervical paraspinal muscles were tested at 2 levels. No abnormalities of insertional activity were seen at either level tested. There was poor relaxation.   IMPRESSION:  Nerve conduction studies done on the right upper extremity was unremarkable. No evidence of a neuropathy is seen. EMG evaluation of the right upper  extremity is normal, without evidence of an overlying cervical radiculopathy.  Marlan Palau. Keith Mohamed Portlock MD 01/11/2015 10:51 AM  [facility] [address] [place], [place] [id number]  Phone [phone] Fax [phone]

## 2015-01-11 NOTE — Progress Notes (Signed)
Please refer to EMG and nerve conduction study procedure note. 

## 2015-01-12 ENCOUNTER — Ambulatory Visit: Payer: 59 | Admitting: Internal Medicine

## 2015-03-01 ENCOUNTER — Other Ambulatory Visit (INDEPENDENT_AMBULATORY_CARE_PROVIDER_SITE_OTHER): Payer: 59

## 2015-03-01 ENCOUNTER — Encounter: Payer: Self-pay | Admitting: Internal Medicine

## 2015-03-01 ENCOUNTER — Ambulatory Visit (INDEPENDENT_AMBULATORY_CARE_PROVIDER_SITE_OTHER): Payer: 59 | Admitting: Internal Medicine

## 2015-03-01 VITALS — BP 114/80 | HR 88 | Ht 64.25 in | Wt 152.5 lb

## 2015-03-01 DIAGNOSIS — K219 Gastro-esophageal reflux disease without esophagitis: Secondary | ICD-10-CM | POA: Diagnosis not present

## 2015-03-01 DIAGNOSIS — R935 Abnormal findings on diagnostic imaging of other abdominal regions, including retroperitoneum: Secondary | ICD-10-CM | POA: Diagnosis not present

## 2015-03-01 DIAGNOSIS — R194 Change in bowel habit: Secondary | ICD-10-CM

## 2015-03-01 DIAGNOSIS — R109 Unspecified abdominal pain: Secondary | ICD-10-CM

## 2015-03-01 DIAGNOSIS — R112 Nausea with vomiting, unspecified: Secondary | ICD-10-CM

## 2015-03-01 DIAGNOSIS — R110 Nausea: Secondary | ICD-10-CM

## 2015-03-01 LAB — CBC WITH DIFFERENTIAL/PLATELET
Basophils Absolute: 0.1 10*3/uL (ref 0.0–0.1)
Basophils Relative: 0.8 % (ref 0.0–3.0)
EOS ABS: 0.1 10*3/uL (ref 0.0–0.7)
EOS PCT: 2 % (ref 0.0–5.0)
HCT: 40.3 % (ref 36.0–46.0)
Hemoglobin: 13.8 g/dL (ref 12.0–15.0)
LYMPHS ABS: 3 10*3/uL (ref 0.7–4.0)
Lymphocytes Relative: 45.2 % (ref 12.0–46.0)
MCHC: 34.2 g/dL (ref 30.0–36.0)
MCV: 88.8 fl (ref 78.0–100.0)
MONOS PCT: 6.5 % (ref 3.0–12.0)
Monocytes Absolute: 0.4 10*3/uL (ref 0.1–1.0)
Neutro Abs: 3 10*3/uL (ref 1.4–7.7)
Neutrophils Relative %: 45.5 % (ref 43.0–77.0)
PLATELETS: 331 10*3/uL (ref 150.0–400.0)
RBC: 4.54 Mil/uL (ref 3.87–5.11)
RDW: 13.9 % (ref 11.5–15.5)
WBC: 6.6 10*3/uL (ref 4.0–10.5)

## 2015-03-01 LAB — COMPREHENSIVE METABOLIC PANEL
ALK PHOS: 41 U/L (ref 39–117)
ALT: 26 U/L (ref 0–35)
AST: 17 U/L (ref 0–37)
Albumin: 4.2 g/dL (ref 3.5–5.2)
BILIRUBIN TOTAL: 0.4 mg/dL (ref 0.2–1.2)
BUN: 9 mg/dL (ref 6–23)
CALCIUM: 9.4 mg/dL (ref 8.4–10.5)
CO2: 33 meq/L — AB (ref 19–32)
CREATININE: 0.7 mg/dL (ref 0.40–1.20)
Chloride: 100 mEq/L (ref 96–112)
GFR: 113.7 mL/min (ref >60.00)
Glucose, Bld: 83 mg/dL (ref 70–99)
Potassium: 3.6 mEq/L (ref 3.5–5.1)
SODIUM: 139 meq/L (ref 135–145)
TOTAL PROTEIN: 7.3 g/dL (ref 6.0–8.3)

## 2015-03-01 NOTE — Patient Instructions (Signed)
Your physician has requested that you go to the basement for the following lab work before leaving today:  CBC, CMET  You have been scheduled for an endoscopy and colonoscopy. Please follow the written instructions given to you at your visit today. Please pick up your prep supplies at the pharmacy within the next 1-3 days. If you use inhalers (even only as needed), please bring them with you on the day of your procedure. Your physician has requested that you go to www.startemmi.com and enter the access code given to you at your visit today. This web site gives a general overview about your procedure. However, you should still follow specific instructions given to you by our office regarding your preparation for the procedure.

## 2015-03-01 NOTE — Progress Notes (Signed)
HISTORY OF PRESENT ILLNESS:  [name] is a 49 y.o. female is referred today by Dr. Clyda GreenerAlvin Blount regarding intermittent nausea with vomiting, abdominal discomfort, and abnormal liver tests. I saw the patient in January 2003 when she underwent complete colonoscopy to evaluate abdominal pain with bloating, diarrhea, and hematochezia. The examination was normal except for internal hemorrhoids. She was felt to have a self-limited infectious colitis which resolved. She was subsequently seen by my partner, Dr. Christella HartiganJacobs, in March 2009 when she was evaluated as an inpatient for iron deficiency anemia. Complete colonoscopy including intubation of the ileum was normal except for hemorrhoids. Upper endoscopy including duodenal biopsies was also normal. In 2014 the patient had problems with symptomatic perirectal fistula for which [facility] (Dr. Byrd HesselbachWaters). No longer has seton, but does report some mild intermittent drainage. She was seen in the emergency room January 2016 with complaints of nausea vomiting and right upper quadrant pain. Abdominal ultrasound was negative except for nonobstructing left renal calculus. CT scan of the abdomen and pelvis with contrast revealed mild relative jejunal wall thickening and mild haziness in the surrounding fat concerning for enteritis secondary to infection or inflammatory process. Laboratories at that time were remarkable for elevated hepatic transaminases with AST 125 and ALT 238. Other liver tests were normal as were her basic chemistries. CBC was normal. Urine pregnancy test negative. The patient does have chronic pain. She was placed on Neurontin 2 months ago. Hydrocodone 2-3 weeks ago. No longer using Ultram or Percocet. She has had problems with constipation since initiating medication for pain. No family history of colon cancer or inflammatory bowel disease. Currently problems with nausea, bloating, and some  mild intermittent right-sided discomfort. She had been having some intermittent GERD symptoms which have improved since she was placed on Nexium by her PCP. No dysphagia.  REVIEW OF SYSTEMS:  All non-GI ROS negative except for back pain, sinus trouble, cough, menstrual cramps, ankle edema  Past Medical History  Diagnosis Date  . Hyperlipidemia   . 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 Constipation   . Seasonal allergies 08-13-13    tx. Claritin  . Hemorrhoids   . Anemia   . HTN (hypertension)     Past Surgical History  Procedure Laterality Date  . Irrigation and debridement abscess  07/08/2012    Procedure: MINOR INCISION AND DRAINAGE OF ABSCESS;  Surgeon: Geryl RankinsEvelyn Varnado, MD;  [facility];  Service: Gynecology;  Laterality: N/A;  Vulvar Abscess  . Anal fistulectomy  08/22/2012    Procedure: FISTULECTOMY ANAL;  Surgeon: Lodema PilotBrian Layton, DO;  Location: [facility];  Service: General;  Laterality: N/A;  rectal examination under anesthesia with seton placement   . Examination under anesthesia  09/17/2012    Procedure: EXAM UNDER ANESTHESIA;  Surgeon: Lodema PilotBrian Layton, DO;  Location: [facility];  Service: General;  Laterality: N/A;  Rectal exam under anesthesia  . Anal fistulectomy  09/17/2012    Procedure: FISTULECTOMY ANAL;  Surgeon: Lodema PilotBrian Layton, DO;  Location: [facility];  Service: General;  Laterality: N/A;  possible fistulotomy  . Fistula plug  09/17/2012    Procedure: FISTULA PLUG;  Surgeon: Lodema PilotBrian Layton, DO;  Location:  SUR[facility]ERY CENTER;  Service: General;  Laterality: N/A;  Possible fistula plug  . D & c hysteroscopy/ novasure endometrial ablation/ i & d thrombosed hemorroid  01-10-2007  DR Miguel AschoffALLAN ROSS  . Hemorrhoid surgery  04-28-2009    [facility]  . Examination under anesthesia N/A 05/14/2013    Procedure: EXAM UNDER ANESTHESIA;  Surgeon: Romie Levee, MD;  Location: [facility];  Service: General;  Laterality: N/A;   . Placement of seton N/A 05/14/2013    Procedure:  PLACEMENT OF SETON;  Surgeon: Romie Levee, MD;  Location: [facility];  Service: General;  Laterality: N/A;  . Rectal ultrasound N/A 07/20/2013    Procedure: RECTAL ULTRASOUND;  Surgeon: Romie Levee, MD;  [facility];  Service: Endoscopy;  Laterality: N/A;  . Rectal exam under anesthesia N/A 08/20/2013    Procedure: mucosal advancement flap ;  Surgeon: Romie Levee, MD;  Location[facility];  Service: General;  Laterality: N/A;  parks anal retractor long rectal instrucments prone jack knife anal fistula     Social History MARLENY FALLER  reports that she has never smoked. She has never used smokeless tobacco. She reports that she drinks alcohol. She reports that she does not use illicit drugs.  family history includes Diabetes in her brother, maternal aunt, maternal uncle, and mother.  Allergies  Allergen Reactions  . Ampicillin Anaphylaxis  . Penicillins Anaphylaxis  . Sulfonamide Derivatives Anaphylaxis  . Hydrocodone-Acetaminophen Hives    Have to take a Benadryl to take med  . Tramadol Itching  . Tylenol [Acetaminophen] Itching       PHYSICAL EXAMINATION: Vital signs: BP 114/80 mmHg  Pulse 88  Ht 5' 4.25" (1.632 m)  Wt 152 lb 8 oz (69.174 kg)  BMI 25.97 kg/m2  Constitutional: generally well-appearing, no acute distress Psychiatric: alert and oriented x3, cooperative Eyes: extraocular movements intact, anicteric, conjunctiva pink Mouth: oral pharynx moist, no lesions Neck: supple no lymphadenopathy Cardiovascular: heart regular rate and rhythm, no murmur Lungs: clear to auscultation bilaterally Abdomen: soft, mild right lower quadrant tenderness to palpation without mass, nondistended, no obvious ascites, no peritoneal signs, normal bowel sounds, no organomegaly Rectal: Deferred until colonoscopy Extremities: no lower extremity edema bilaterally Skin: no lesions on visible extremities Neuro: No  focal deficits.   ASSESSMENT:  #1. Problems with right-sided abdominal discomfort and intermittent nausea with vomiting. Abnormal CT scan as described. This may have represented an acute infectious process which was self-limited with associated abnormal LFTs and subsequent significant, though incomplete, improvement. Alternatively, with history of iron to see anemia, perirectal fistula, and CT scan as described, inflammatory bowel disease needs to be excluded. Somewhat reassuring that she has had previous negative colonoscopy and ileal intubation 2010. However, may have had interval change. We discussed this in detail. #2. Constipation secondary to medication #3. GERD. Improved on Nexium #4. History of perirectal fistula requiring ALT total surgeries   PLAN:  #1. CBC and basic chemistries today #2. Repeat liver tests today, to compare to previous abnormal levels #3. Continue Nexium once daily #4. Schedule colonoscopy and upper endoscopy to evaluate pain and CT related abnormalities. Rule out IBD.The nature of the procedure, as well as the risks, benefits, and alternatives were carefully and thoroughly reviewed with the patient. Ample time for discussion and questions allowed. The patient understood, was satisfied, and agreed to proceed.  A copy has been sent to Dr. Bruna Potter

## 2015-03-07 ENCOUNTER — Telehealth: Payer: Self-pay | Admitting: Internal Medicine

## 2015-03-07 MED ORDER — ONDANSETRON HCL 4 MG PO TABS: 4.0000 mg | ORAL_TABLET | ORAL | Status: DC | PRN

## 2015-03-07 NOTE — Telephone Encounter (Signed)
Pt states that this morning she started having nausea and vomiting, spitting up foamy material. Pt states she breaks out in a cold sweat and feels as if she is going to pass out. Pt would like something for the nausea. Please advise.

## 2015-03-07 NOTE — Telephone Encounter (Signed)
Zofran 4 mg every 4 hours as needed. Dispense 60. No refills

## 2015-03-07 NOTE — Telephone Encounter (Signed)
Prescription sent to pharmacy, pt aware. 

## 2015-03-15 ENCOUNTER — Telehealth: Payer: Self-pay | Admitting: Internal Medicine

## 2015-03-15 MED ORDER — NA SULFATE-K SULFATE-MG SULF 17.5-3.13-1.6 GM/177ML PO SOLN: 1.0000 | Freq: Once | ORAL | Status: DC

## 2015-03-15 NOTE — Telephone Encounter (Signed)
suprep sent

## 2015-03-18 ENCOUNTER — Ambulatory Visit (AMBULATORY_SURGERY_CENTER): Payer: 59 | Admitting: Internal Medicine

## 2015-03-18 ENCOUNTER — Encounter: Payer: Self-pay | Admitting: Internal Medicine

## 2015-03-18 VITALS — BP 116/76 | HR 80 | Temp 99.0°F | Resp 14 | Ht 64.25 in | Wt 152.0 lb

## 2015-03-18 DIAGNOSIS — R935 Abnormal findings on diagnostic imaging of other abdominal regions, including retroperitoneum: Secondary | ICD-10-CM

## 2015-03-18 DIAGNOSIS — R1031 Right lower quadrant pain: Secondary | ICD-10-CM | POA: Diagnosis not present

## 2015-03-18 DIAGNOSIS — Z1211 Encounter for screening for malignant neoplasm of colon: Secondary | ICD-10-CM

## 2015-03-18 DIAGNOSIS — R194 Change in bowel habit: Secondary | ICD-10-CM | POA: Diagnosis not present

## 2015-03-18 DIAGNOSIS — K219 Gastro-esophageal reflux disease without esophagitis: Secondary | ICD-10-CM | POA: Diagnosis not present

## 2015-03-18 DIAGNOSIS — K222 Esophageal obstruction: Secondary | ICD-10-CM

## 2015-03-18 MED ORDER — SODIUM CHLORIDE 0.9 % IV SOLN: 500.0000 mL | INTRAVENOUS | Status: DC

## 2015-03-18 MED ORDER — SODIUM CHLORIDE 0.9 % IV SOLN
4.0000 mg | Freq: Once | INTRAVENOUS | Status: DC
  Administered 2015-03-18: 4 mg via INTRAVENOUS

## 2015-03-18 NOTE — Patient Instructions (Addendum)
YOU HAD AN ENDOSCOPIC PROCEDURE TODAY AT THE [facility]:   Refer to the procedure report that was given to you for any specific questions about what was found during the examination.  If the procedure report does not answer your questions, please call your gastroenterologist to clarify.  If you requested that your care partner not be given the details of your procedure findings, then the procedure report has been included in a sealed envelope for you to review at your convenience later.  YOU SHOULD EXPECT: Some feelings of bloating in the abdomen. Passage of more gas than usual.  Walking can help get rid of the air that was put into your GI tract during the procedure and reduce the bloating. If you had a lower endoscopy (such as a colonoscopy or flexible sigmoidoscopy) you may notice spotting of blood in your stool or on the toilet paper. If you underwent a bowel prep for your procedure, you may not have a normal bowel movement for a few days.  Please Note:  You might notice some irritation and congestion in your nose or some drainage.  This is from the oxygen used during your procedure.  There is no need for concern and it should clear up in a day or so.  SYMPTOMS TO REPORT IMMEDIATELY:   Following lower endoscopy (colonoscopy or flexible sigmoidoscopy):  Excessive amounts of blood in the stool  Significant tenderness or worsening of abdominal pains  Swelling of the abdomen that is new, acute  Fever of 100F or higher   Following upper endoscopy (EGD)  Vomiting of blood or coffee ground material  New chest pain or pain under the shoulder blades  Painful or persistently difficult swallowing  New shortness of breath  Fever of 100F or higher  Black, tarry-looking stools  For urgent or emergent issues, a gastroenterologist can be reached at any hour by calling [phone].   DIET: Your first meal following the procedure should be a small meal and then it is ok to progress to  your normal diet. Heavy or fried foods are harder to digest and may make you feel nauseous or bloated.  Likewise, meals heavy in dairy and vegetables can increase bloating.  Drink plenty of fluids but you should avoid alcoholic beverages for 24 hours.  ACTIVITY:  You should plan to take it easy for the rest of today and you should NOT DRIVE or use heavy machinery until tomorrow (because of the sedation medicines used during the test).    FOLLOW UP: Our staff will call the number listed on your records the next business day following your procedure to check on you and address any questions or concerns that you may have regarding the information given to you following your procedure. If we do not reach you, we will leave a message.  However, if you are feeling well and you are not experiencing any problems, there is no need to return our call.  We will assume that you have returned to your regular daily activities without incident.  If any biopsies were taken you will be contacted by phone or by letter within the next 1-3 weeks.  Please call us at [phone] if you have not heard about the biopsies in 3 weeks.    SIGNATURES/CONFIDENTIALITY: You and/or your care partner have signed paperwork which will be entered into your electronic medical record.  These signatures attest to the fact that that the information above on your After Visit Summary has been reviewed   and is understood.  Full responsibility of the confidentiality of this discharge information lies with you and/or your care-partner.YOU HAD AN ENDOSCOPIC PROCEDURE TODAY AT [facility]:   Refer to the procedure report that was given to you for any specific questions about what was found during the examination.  If the procedure report does not answer your questions, please call your gastroenterologist to clarify.  If you requested that your care partner not be given the details of your procedure findings, then the procedure  report has been included in a sealed envelope for you to review at your convenience later.  YOU SHOULD EXPECT: Some feelings of bloating in the abdomen. Passage of more gas than usual.  Walking can help get rid of the air that was put into your GI tract during the procedure and reduce the bloating. If you had a lower endoscopy (such as a colonoscopy or flexible sigmoidoscopy) you may notice spotting of blood in your stool or on the toilet paper. If you underwent a bowel prep for your procedure, you may not have a normal bowel movement for a few days.  Please Note:  You might notice some irritation and congestion in your nose or some drainage.  This is from the oxygen used during your procedure.  There is no need for concern and it should clear up in a day or so.  SYMPTOMS TO REPORT IMMEDIATELY:   Following lower endoscopy (colonoscopy or flexible sigmoidoscopy):  Excessive amounts of blood in the stool  Significant tenderness or worsening of abdominal pains  Swelling of the abdomen that is new, acute  Fever of 100F or higher   Following upper endoscopy (EGD)  Vomiting of blood or coffee ground material  New chest pain or pain under the shoulder blades  Painful or persistently difficult swallowing  New shortness of breath  Fever of 100F or higher  Black, tarry-looking stools  For urgent or emergent issues, a gastroenterologist can be reached at any hour by calling [phone].   DIET: Your first meal following the procedure should be a small meal and then it is ok to progress to your normal diet. Heavy or fried foods are harder to digest and may make you feel nauseous or bloated.  Likewise, meals heavy in dairy and vegetables can increase bloating.  Drink plenty of fluids but you should avoid alcoholic beverages for 24 hours.  ACTIVITY:  You should plan to take it easy for the rest of today and you should NOT DRIVE or use heavy machinery until tomorrow (because of the sedation  medicines used during the test).    FOLLOW UP: Our staff will call the number listed on your records the next business day following your procedure to check on you and address any questions or concerns that you may have regarding the information given to you following your procedure. If we do not reach you, we will leave a message.  However, if you are feeling well and you are not experiencing any problems, there is no need to return our call.  We will assume that you have returned to your regular daily activities without incident.  If any biopsies were taken you will be contacted by phone or by letter within the next 1-3 weeks.  Please call us at [phone] if you have not heard about the biopsies in 3 weeks.    SIGNATURES/CONFIDENTIALITY: You and/or your care partner have signed paperwork which will be entered into your electronic medical record.  These signatures attest to the fact that that the information above on your After Visit Summary has been reviewed and is understood.  Full responsibility of the confidentiality of this discharge information lies with you and/or your care-partner.  GERD information given.  Continue Nexium.  Return to Dr. Bruna PotterBlount, GI follow-up as needed.

## 2015-03-18 NOTE — Op Note (Signed)
[facility] [address] [place], [id number]   ENDOSCOPY PROCEDURE REPORT  PATIENT: [name], [name]  MR#: [id number] BIRTHDATE: June 07, 1966 , 48  yrs. old GENDER: female ENDOSCOPIST: Roxy CedarJohn N Perry Jr, MD REFERRED BY:  Clyda GreenerAlvin Blount, M.D. PROCEDURE DATE:  03/18/2015 PROCEDURE:  EGD, diagnostic ASA CLASS:     Class II INDICATIONS:  abdominal pain and history of esophageal reflux. MEDICATIONS: Monitored anesthesia care and Propofol 200 mg IV TOPICAL ANESTHETIC: none  DESCRIPTION OF PROCEDURE: After the risks benefits and alternatives of the procedure were thoroughly explained, informed consent was obtained.  The LB [id number] endoscope was introduced through the mouth and advanced to the second portion of the duodenum , Without limitations.  The instrument was slowly withdrawn as the mucosa was fully examined.    EXAM:The esophagus revealed an incidental large caliber distal ring at the GE junction.  Esophagus was otherwise normal.  The stomach was normal.  The duodenum was normal.  Retroflexed views revealed a hiatal hernia.     The scope was then withdrawn from the patient and the procedure completed.  COMPLICATIONS: There were no immediate complications.  ENDOSCOPIC IMPRESSION: 1. Incidental esophageal ring. Otherwise normal EGD 2. GERD  RECOMMENDATIONS: 1.  Anti-reflux regimen to be followed 2.  Continue Nexium daily to control acid reflux symptoms 3. Return to the care of Dr. Bruna PotterBlount. GI follow-up as needed  REPEAT EXAM:  eSigned:  Roxy CedarJohn N Perry Jr, MD 03/18/2015 2:46 PM    UX:[id number]:Alvin Bruna PotterBlount, MD and The Patient

## 2015-03-18 NOTE — Op Note (Signed)
[facility] [address] [place], [id number]   COLONOSCOPY PROCEDURE REPORT  PATIENT: [name], [name]  MR#: [id number] BIRTHDATE: 29-Aug-1966 , 48  yrs. old GENDER: female ENDOSCOPIST: Roxy CedarJohn N Levern Pitter Jr, MD REFERRED XB:[id number]:Alvin Blount, M.D. PROCEDURE DATE:  03/18/2015 PROCEDURE:   Colonoscopy, diagnostic First Screening Colonoscopy - Avg.  risk and is 50 yrs.  old or older - No.  Prior Negative Screening - Now for repeat screening. N/A  History of Adenoma - Now for follow-up colonoscopy & has been > or = to 3 yrs.  N/A  Recommend repeat exam, <10 yrs? No ASA CLASS:   Class II INDICATIONS:Evaluation of barium enema or other imaging study of an abnormality that is likely to be clinically significant, such as filling defect or stricture. Abnormal CT. Problems with abdominal pain and change in bowel habits. Prior history of iron deficiency anemia and perirectal fistula. Rule out inflammatory bowel disease. Also routine colon cancer screening. MEDICATIONS: Monitored anesthesia care and Propofol 200 mg IV  DESCRIPTION OF PROCEDURE:   After the risks benefits and alternatives of the procedure were thoroughly explained, informed consent was obtained.  The digital rectal exam revealed no abnormalities of the rectum.   The LB [id number]  endoscope was introduced through the anus and advanced to the cecum, which was identified by both the appendix and ileocecal valve. No adverse events experienced.   The quality of the prep was excellent. (MoviPrep was used)  The instrument was then slowly withdrawn as the colon was fully examined.   COLON FINDINGS: The examined terminal ileum was deeply intubated and appeared to be normal.   A normal appearing cecum, ileocecal valve, and appendiceal orifice were identified.  The ascending, transverse, descending, sigmoid colon, and rectum appeared unremarkable.  Retroflexed views revealed no abnormalities. The time to cecum = 2.0  Withdrawal time = 8.1   The scope was withdrawn and the procedure completed. COMPLICATIONS: There were no immediate complications.  ENDOSCOPIC IMPRESSION: 1.   The examined terminal ileum appeared  normal 2.   Normal colonoscopy  RECOMMENDATIONS: 1.  Continue current colorectal screening recommendations for "routine risk" patients with a repeat colonoscopy in 10 years. 2.  Upper endoscopy today (please see report)  eSigned:  Roxy CedarJohn N Hayven Fatima Jr, MD 03/18/2015 2:42 PM   cc: Clyda GreenerAlvin Blount, MD and The Patient

## 2015-03-18 NOTE — Progress Notes (Signed)
1450 Zofran 4mg . IV for nausea.

## 2015-03-21 ENCOUNTER — Telehealth: Payer: Self-pay | Admitting: *Deleted

## 2015-03-21 NOTE — Telephone Encounter (Signed)
  Follow up Call-  Call back number 03/18/2015  Post procedure Call Back phone  # [phone] or [phone]  Permission to leave phone message Yes     Patient questions:  Message left to call us if necessary.

## 2015-08-08 ENCOUNTER — Other Ambulatory Visit: Payer: Self-pay | Admitting: Family Medicine

## 2015-08-08 ENCOUNTER — Ambulatory Visit
Admission: RE | Admit: 2015-08-08 | Discharge: 2015-08-08 | Disposition: A | Discharge status: Home / Self Care | Payer: 59 | Source: Ambulatory Visit | Attending: Family Medicine | Admitting: Family Medicine

## 2015-08-08 DIAGNOSIS — R05 Cough: Secondary | ICD-10-CM

## 2015-08-08 DIAGNOSIS — R059 Cough, unspecified: Secondary | ICD-10-CM

## 2015-12-06 ENCOUNTER — Ambulatory Visit: Payer: Self-pay | Admitting: Physical Therapy

## 2016-03-10 ENCOUNTER — Emergency Department (HOSPITAL_COMMUNITY)
Admission: EM | Admit: 2016-03-10 | Discharge: 2016-03-10 | Disposition: A | Discharge status: Home / Self Care | Payer: Self-pay | Attending: Emergency Medicine | Admitting: Emergency Medicine

## 2016-03-10 ENCOUNTER — Encounter (HOSPITAL_COMMUNITY): Payer: Self-pay | Admitting: Emergency Medicine

## 2016-03-10 DIAGNOSIS — A4902 Methicillin resistant Staphylococcus aureus infection, unspecified site: Secondary | ICD-10-CM | POA: Insufficient documentation

## 2016-03-10 DIAGNOSIS — Z79899 Other long term (current) drug therapy: Secondary | ICD-10-CM | POA: Insufficient documentation

## 2016-03-10 DIAGNOSIS — L0292 Furuncle, unspecified: Secondary | ICD-10-CM | POA: Insufficient documentation

## 2016-03-10 DIAGNOSIS — Z862 Personal history of diseases of the blood and blood-forming organs and certain disorders involving the immune mechanism: Secondary | ICD-10-CM | POA: Insufficient documentation

## 2016-03-10 DIAGNOSIS — K5900 Constipation, unspecified: Secondary | ICD-10-CM | POA: Insufficient documentation

## 2016-03-10 DIAGNOSIS — Z8639 Personal history of other endocrine, nutritional and metabolic disease: Secondary | ICD-10-CM | POA: Insufficient documentation

## 2016-03-10 DIAGNOSIS — I10 Essential (primary) hypertension: Secondary | ICD-10-CM | POA: Insufficient documentation

## 2016-03-10 DIAGNOSIS — Z880 Allergy status to penicillin: Secondary | ICD-10-CM | POA: Insufficient documentation

## 2016-03-10 DIAGNOSIS — B957 Other staphylococcus as the cause of diseases classified elsewhere: Secondary | ICD-10-CM

## 2016-03-10 MED ORDER — DOXYCYCLINE HYCLATE 100 MG PO CAPS: 100.0000 mg | ORAL_CAPSULE | Freq: Two times a day (BID) | ORAL | Status: DC

## 2016-03-10 NOTE — ED Notes (Signed)
Per pt, states abscesses that she noticed 3 weeks ago-2 on left side and 2 on buttocks

## 2016-03-10 NOTE — ED Notes (Signed)
In addition to below note, pt mentions concern over non-productive cough x 1 month, worse at night and with activity, improved with rest during daytime. No associated symptoms. No history of asthma or COPD.

## 2016-03-10 NOTE — Discharge Instructions (Signed)
Abscess An abscess is an infected area that contains a collection of pus and debris.It can occur in almost any part of the body. An abscess is also known as a furuncle or boil. CAUSES  An abscess occurs when tissue gets infected. This can occur from blockage of oil or sweat glands, infection of hair follicles, or a minor injury to the skin. As the body tries to fight the infection, pus collects in the area and creates pressure under the skin. This pressure causes pain. People with weakened immune systems have difficulty fighting infections and get certain abscesses more often.  SYMPTOMS Usually an abscess develops on the skin and becomes a painful mass that is red, warm, and tender. If the abscess forms under the skin, you may feel a moveable soft area under the skin. Some abscesses break open (rupture) on their own, but most will continue to get worse without care. The infection can spread deeper into the body and eventually into the bloodstream, causing you to feel ill.  DIAGNOSIS  Your caregiver will take your medical history and perform a physical exam. A sample of fluid may also be taken from the abscess to determine what is causing your infection. TREATMENT  Your caregiver may prescribe antibiotic medicines to fight the infection. However, taking antibiotics alone usually does not cure an abscess. Your caregiver may need to make a small cut (incision) in the abscess to drain the pus. In some cases, gauze is packed into the abscess to reduce pain and to continue draining the area. HOME CARE INSTRUCTIONS   Only take over-the-counter or prescription medicines for pain, discomfort, or fever as directed by your caregiver.  If you were prescribed antibiotics, take them as directed. Finish them even if you start to feel better.  If gauze is used, follow your caregiver's directions for changing the gauze.  To avoid spreading the infection:  Keep your draining abscess covered with a  bandage.  Wash your hands well.  Do not share personal care items, towels, or whirlpools with others.  Avoid skin contact with others.  Keep your skin and clothes clean around the abscess.  Keep all follow-up appointments as directed by your caregiver. SEEK MEDICAL CARE IF:   You have increased pain, swelling, redness, fluid drainage, or bleeding.  You have muscle aches, chills, or a general ill feeling.  You have a fever. MAKE SURE YOU:   Understand these instructions.  Will watch your condition.  Will get help right away if you are not doing well or get worse.   This information is not intended to replace advice given to you by your health care provider. Make sure you discuss any questions you have with your health care provider.   Document Released: 08/01/2005 Document Revised: 04/22/2012 Document Reviewed: 01/04/2012 Elsevier Interactive Patient Education 2016 [employer] Community-Associated MRSA CA-MRSA stands for community-associated methicillin-resistant Staphylococcus aureus. MRSA is a type of bacteria that is resistant to some common antibiotic medicines. It can cause infections in the skin and many other places in the body. Staphylococcus aureus, which is often called staph, is a bacterium that normally lives on the skin or in the nose of some people. Staph on the surface of the skin or in the nose does not cause problems. However, if the staph enters the body through a cut, wound, or break in the skin, an infection can happen. Until recently, infections with the MRSA type of staph occurred mainly in hospitals and other health care settings. Now there  are increasing problems with MRSA infections in the community as well. Infections with MRSA may be very serious or even life-threatening. CAUSES  All staph bacteria, including MRSA:  Are normally harmless unless they enter the body through a scratch, cut, or wound, such as with surgery.  Can be spread from person to  person by touching contaminated objects or through direct contact.  Cases of MRSA diseases in the community have been associated with:  Recent antibiotic use.  Sharing contaminated towels or clothes.  Having active skin diseases.  Participating in contact sports.  Living in crowded settings.  IV drug use.  Community-associated MRSA infections are usually skin infections, but they may cause other severe illnesses, such as:  Pneumonia.  Bone or joint infections.  Bloodstream infections (sepsis). SYMPTOMS This condition usually starts with a skin infection. Signs and symptoms may vary and can include:  An infected area of skin that is red and swollen, feels painful, and is warm to the touch.  Pus under the skin or pus draining from the infected area.  Fever. DIAGNOSIS This condition is diagnosed by cultures of fluid samples that may come from:  Swabs that are taken from cuts or wounds in infected areas.  Nasal swabs.  Saliva or deep-cough specimens from the lungs (sputum).  Urine.  Blood. TREATMENT  Treatment varies and is based on how serious, how deep, or how extensive the infection is. For example:  Some skin infections, such as a small boil or abscess, may be treated by draining pus from the site of the infection.  Deeper or more widespread soft tissue infections are usually treated with surgery to drain pus and with antibiotics that are given through a vein or by mouth. This may be recommended even if you are pregnant.  Serious infections may require a hospital stay. If antibiotics are prescribed, they may be needed for several weeks. HOME CARE INSTRUCTIONS  Take your antibiotics as directed by your health care provider. Finish the antibiotic even if you start to feel better.  Avoid close contact with people around you as much as possible. Do not use towels, razors, toothbrushes, bedding, or other items that will be used by others.  To fight the infection,  follow your health care provider's instructions for wound care. Wash your hands before and after changing your bandages (dressings).  If you have an intravascular device, such as a catheter or port, make sure that you know how to care for it. This might include:  Keeping the skin around the area clean.  Avoiding having your blood pressure taken on the side where the catheter is located.  Knowing when to report any suspected problems to your health care provider.  Be sure to tell any health care providers who care for you that you have MRSA so they are aware of your infection. PREVENTION  Wash your hands frequently with soap and water for at least 15 seconds. If soap and water are not available, use alcohol-based hand disinfectants.  Make sure that people who live with you wash their hands often, too.  Do not share personal items. For example, avoid sharing razors and other personal hygiene items, towels, clothing, and athletic equipment.  Wash and dry your clothes and bedding at the warmest temperatures that are recommended on the labels.  Keep wounds covered. Pus from infected sores may contain MRSA and other bacteria. Keep cuts and abrasions clean and covered with germ-free (sterile), dry bandages until they are healed.  If you have  a wound that appears infected, ask your health care provider if a culture should be done for MRSA and other bacteria.  If you are breastfeeding, talk to your health care provider about MRSA. You may be asked to temporarily stop breastfeeding. SEEK IMMEDIATE MEDICAL CARE IF:  The infection appears to be getting worse. Signs include:  Increased warmth, redness, or tenderness around the wound site.  A red line that extends from the infection site.  A dark color in the area around the infection.  Wound drainage that is tan, yellow, or green.  A bad smell coming from the wound.  You feel nauseous, you vomit, or you cannot keep medicine down.  You  have a fever.  You have difficulty breathing.   This information is not intended to replace advice given to you by your health care provider. Make sure you discuss any questions you have with your health care provider.   Document Released: 01/25/2006 Document Revised: 11/12/2014 Document Reviewed: 01/25/2011 Elsevier Interactive Patient Education [employer]

## 2016-03-14 NOTE — ED Provider Notes (Signed)
CSN: [id number]     Arrival date & time 03/10/16  [id number] History   First MD Initiated Contact with Patient 03/10/16 [phone]     Chief Complaint  Patient presents with  . Abscess     (Consider location/radiation/quality/duration/timing/severity/associated sxs/prior Treatment) Patient is a 50 y.o. female presenting with abscess. The history is provided by the patient.  Abscess Location:  Ano-genital Ano-genital abscess location:  L buttock and R buttock Size:  1-2 cm Abscess quality: painful and redness   Abscess quality: not draining and no fluctuance   Red streaking: no   Duration:  2 weeks Progression:  Unchanged Pain details:    Quality:  Sharp and throbbing   Severity:  Moderate Chronicity:  New Context: not diabetes, not immunosuppression, not injected drug use and not skin injury   Ineffective treatments:  None tried Risk factors: family hx of MRSA, hx of MRSA and prior abscess     Past Medical History  Diagnosis Date  . Hyperlipidemia   . 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 Constipation   . Seasonal allergies 08-13-13    tx. Claritin  . Hemorrhoids   . Anemia   . HTN (hypertension)    Past Surgical History  Procedure Laterality Date  . Irrigation and debridement abscess  07/08/2012    Procedure: MINOR INCISION AND DRAINAGE OF ABSCESS;  Surgeon: Geryl RankinsEvelyn Varnado, MD;  [facility] [place];  Service: Gynecology;  Laterality: N/A;  Vulvar Abscess  . Anal fistulectomy  08/22/2012    Procedure: FISTULECTOMY ANAL;  Surgeon: Lodema PilotBrian Layton, DO;  Location: [facility];  Service: General;  Laterality: N/A;  rectal examination under anesthesia with seton placement   . Examination under anesthesia  09/17/2012    Procedure: EXAM UNDER ANESTHESIA;  Surgeon: Lodema PilotBrian Layton, DO;  Location: [facility];  Service: General;  Laterality: N/A;  Rectal exam under anesthesia  . Anal fistulectomy  09/17/2012    Procedure: FISTULECTOMY ANAL;  Surgeon: Lodema PilotBrian Layton, DO;  [place]: [facility]  [place];  Service: General;  Laterality: N/A;  possible fistulotomy  . Fistula plug  09/17/2012    Procedure: FISTULA PLUG;  Surgeon: Lodema PilotBrian Layton, DO;  Location: [facility];  Service: General;  Laterality: N/A;  Possible fistula plug  . D & c hysteroscopy/ novasure endometrial ablation/ i & d thrombosed hemorroid  01-10-2007  DR Miguel AschoffALLAN ROSS  . Hemorrhoid surgery  04-28-2009    [facility] INTERNAL HEMORRHOIDS  . Examination under anesthesia N/A 05/14/2013    Procedure: EXAM UNDER ANESTHESIA;  Surgeon: Romie LeveeAlicia Thomas, MD;  [facility] [place];  Service: General;  Laterality: N/A;  . Placement of seton N/A 05/14/2013    Procedure:  PLACEMENT OF SETON;  Surgeon: Romie LeveeAlicia Thomas, MD;  [facility] ;  Service: General;  Laterality: N/A;  . Rectal ultrasound N/A 07/20/2013    Procedure: RECTAL ULTRASOUND;  Surgeon: Romie LeveeAlicia Thomas, MD;  Location: [facility];  Service: Endoscopy;  Laterality: N/A;  . Rectal exam under anesthesia N/A 08/20/2013    Procedure: mucosal advancement flap ;  Surgeon: Romie LeveeAlicia Thomas, MD;  Location: [facility];  Service: General;  Laterality: N/A;  parks anal retractor long rectal instrucments prone jack knife anal fistula    Family History  Problem Relation Age of Onset  . Diabetes Mother   . Diabetes Brother   . Diabetes Maternal Uncle   . Diabetes Maternal Aunt   . Colon cancer Neg Hx   . Stomach cancer Neg Hx  Social History  Substance Use Topics  . Smoking status: Never Smoker   . Smokeless tobacco: Never Used  . Alcohol Use: 0.0 oz/week    0 Standard drinks or equivalent per week     Comment: occa   OB History    No data available     Review of Systems  All other systems reviewed and are negative.     Allergies  Ampicillin; Penicillins; Sulfonamide derivatives; Hydrocodone-acetaminophen; Tramadol; and Tylenol  Home Medications   Prior to Admission medications   Medication Sig Start Date End Date  Taking? Authorizing Provider  benzonatate (TESSALON) 100 MG capsule Take 2 capsules (200 mg total) by mouth 2 (two) times daily as needed for cough. 12/01/14   Roxy Horseman, PA-C  docusate sodium (COLACE) 100 MG capsule Take 1 capsule (100 mg total) by mouth 2 (two) times daily. Patient taking differently: Take 100 mg by mouth daily.  11/20/13   Romie Levee, MD  doxycycline (VIBRAMYCIN) 100 MG capsule Take 1 capsule (100 mg total) by mouth 2 (two) times daily. 03/10/16   Derwood Kaplan, MD  gabapentin (NEURONTIN) 300 MG capsule Take 300 mg by mouth daily.    Historical Provider, MD  gemfibrozil (LOPID) 600 MG tablet Take 600 mg by mouth 2 (two) times daily before a meal.    Historical Provider, MD  HYDROcodone-acetaminophen (NORCO) 10-325 MG per tablet Take 1 tablet by mouth 2 (two) times daily. 01/27/15   Historical Provider, MD  ketotifen (ZADITOR) 0.025 % ophthalmic solution Place 1 drop into both eyes daily.    Historical Provider, MD  lisinopril-hydrochlorothiazide (PRINZIDE,ZESTORETIC) 20-25 MG per tablet Take 1 tablet by mouth daily. 02/23/15   Historical Provider, MD  loratadine (CLARITIN) 10 MG tablet Take 10 mg by mouth daily.    Historical Provider, MD  ondansetron (ZOFRAN) 4 MG tablet Take 1 tablet (4 mg total) by mouth every 4 (four) hours as needed for nausea or vomiting. 03/07/15   Hilarie Fredrickson, MD  oxyCODONE-acetaminophen (PERCOCET/ROXICET) 5-325 MG per tablet Take 2 tablets by mouth every 6 (six) hours as needed for severe pain. 12/01/14   Roxy Horseman, PA-C  QUASENSE 0.15-0.03 MG tablet Take 1 tablet by mouth daily. 02/14/15   Historical Provider, MD  traMADol (ULTRAM) 50 MG tablet Take 1 tablet by mouth 2 (two) times daily as needed. 11/25/14   Historical Provider, MD   BP 137/98 mmHg  Pulse 80  Temp(Src) 98.2 F (36.8 C) (Oral)  Resp 16  SpO2 96% Physical Exam  Constitutional: She is oriented to person, place, and time. She appears well-developed.  HENT:  Head: Normocephalic  and atraumatic.  Eyes: EOM are normal.  Neck: Normal range of motion. Neck supple.  Cardiovascular: Normal rate.   Pulmonary/Chest: Effort normal.  Abdominal: Bowel sounds are normal.  Neurological: She is alert and oriented to person, place, and time.  Skin: Skin is warm and dry. Rash noted.  Pt has 4 lesions in her gluteal region, 2 in each buttock. The lesions are 1.5-2 cm in DM. No fluctuance. No drainage.  Nursing note and vitals reviewed.   ED Course  Procedures (including critical care time) Labs Review Labs Reviewed - No data to display  Imaging Review No results found. I have personally reviewed and evaluated these images and lab results as part of my medical decision-making.   EKG Interpretation None      MDM   Final diagnoses:  Boils  Staphylococcus aureus infection, multiple-resistant (MRSA)    Has multiple  boils. No drainable abscess. Will give bactrim, warm compresses. Strict ER return precautions have been discussed, and patient is agreeing with the plan and is comfortable with the workup done and the recommendations from the ER.     Derwood Kaplan, MD 03/14/16 [phone]

## 2016-10-10 ENCOUNTER — Emergency Department (HOSPITAL_COMMUNITY)
Admission: EM | Admit: 2016-10-10 | Discharge: 2016-10-10 | Disposition: A | Discharge status: Home / Self Care | Payer: Self-pay | Attending: Emergency Medicine | Admitting: Emergency Medicine

## 2016-10-10 ENCOUNTER — Emergency Department (HOSPITAL_COMMUNITY): Payer: Self-pay

## 2016-10-10 ENCOUNTER — Encounter (HOSPITAL_COMMUNITY): Payer: Self-pay | Admitting: *Deleted

## 2016-10-10 DIAGNOSIS — Z79899 Other long term (current) drug therapy: Secondary | ICD-10-CM | POA: Insufficient documentation

## 2016-10-10 DIAGNOSIS — I10 Essential (primary) hypertension: Secondary | ICD-10-CM | POA: Insufficient documentation

## 2016-10-10 DIAGNOSIS — E876 Hypokalemia: Secondary | ICD-10-CM

## 2016-10-10 DIAGNOSIS — R42 Dizziness and giddiness: Secondary | ICD-10-CM

## 2016-10-10 HISTORY — DX: Dizziness and giddiness: R42

## 2016-10-10 LAB — I-STAT TROPONIN, ED: Troponin i, poc: 0 ng/mL (ref 0.00–0.08)

## 2016-10-10 LAB — BASIC METABOLIC PANEL
ANION GAP: 13 (ref 5–15)
BUN: 7 mg/dL (ref 6–20)
CHLORIDE: 97 mmol/L — AB (ref 101–111)
CO2: 29 mmol/L (ref 22–32)
Calcium: 10 mg/dL (ref 8.9–10.3)
Creatinine, Ser: 0.66 mg/dL (ref 0.44–1.00)
GFR calc non Af Amer: >60 mL/min (ref >60)
GLUCOSE: 87 mg/dL (ref 65–99)
POTASSIUM: 2.7 mmol/L — AB (ref 3.5–5.1)
Sodium: 139 mmol/L (ref 135–145)

## 2016-10-10 LAB — CBC WITH DIFFERENTIAL/PLATELET
BASOS ABS: 0.1 10*3/uL (ref 0.0–0.1)
BASOS PCT: 1 %
Eosinophils Absolute: 0.1 10*3/uL (ref 0.0–0.7)
Eosinophils Relative: 2 %
HEMATOCRIT: 41.9 % (ref 36.0–46.0)
HEMOGLOBIN: 14.4 g/dL (ref 12.0–15.0)
LYMPHS PCT: 43 %
Lymphs Abs: 2.5 10*3/uL (ref 0.7–4.0)
MCH: 31 pg (ref 26.0–34.0)
MCHC: 34.4 g/dL (ref 30.0–36.0)
MCV: 90.1 fL (ref 78.0–100.0)
Monocytes Absolute: 0.3 10*3/uL (ref 0.1–1.0)
Monocytes Relative: 6 %
NEUTROS ABS: 2.8 10*3/uL (ref 1.7–7.7)
NEUTROS PCT: 48 %
Platelets: 339 10*3/uL (ref 150–400)
RBC: 4.65 MIL/uL (ref 3.87–5.11)
RDW: 12.9 % (ref 11.5–15.5)
WBC: 5.7 10*3/uL (ref 4.0–10.5)

## 2016-10-10 MED ORDER — SODIUM CHLORIDE 0.9 % IV SOLN: INTRAVENOUS | Status: DC

## 2016-10-10 MED ORDER — POTASSIUM CHLORIDE CRYS ER 20 MEQ PO TBCR
40.0000 meq | EXTENDED_RELEASE_TABLET | Freq: Once | ORAL | Status: AC
  Administered 2016-10-10: 40 meq via ORAL
  Filled 2016-10-10: qty 2

## 2016-10-10 MED ORDER — ONDANSETRON 4 MG PO TBDP: 4.0000 mg | ORAL_TABLET | Freq: Three times a day (TID) | ORAL | 0 refills | Status: DC | PRN

## 2016-10-10 MED ORDER — MECLIZINE HCL 25 MG PO TABS
25.0000 mg | ORAL_TABLET | Freq: Once | ORAL | Status: AC
  Administered 2016-10-10: 25 mg via ORAL
  Filled 2016-10-10: qty 1

## 2016-10-10 MED ORDER — MECLIZINE HCL 25 MG PO TABS: 25.0000 mg | ORAL_TABLET | Freq: Three times a day (TID) | ORAL | 0 refills | Status: DC | PRN

## 2016-10-10 MED ORDER — ONDANSETRON HCL 4 MG/2ML IJ SOLN
4.0000 mg | Freq: Once | INTRAMUSCULAR | Status: AC
  Administered 2016-10-10: 4 mg via INTRAVENOUS
  Filled 2016-10-10: qty 2

## 2016-10-10 MED ORDER — SODIUM CHLORIDE 0.9 % IV BOLUS (SEPSIS)
1000.0000 mL | Freq: Once | INTRAVENOUS | Status: AC
  Administered 2016-10-10: 1000 mL via INTRAVENOUS

## 2016-10-10 MED ORDER — DIAZEPAM 5 MG PO TABS
5.0000 mg | ORAL_TABLET | Freq: Once | ORAL | Status: AC
  Administered 2016-10-10: 5 mg via ORAL
  Filled 2016-10-10: qty 1

## 2016-10-10 MED ORDER — POTASSIUM CHLORIDE IN NACL 20-0.9 MEQ/L-% IV SOLN
INTRAVENOUS | Status: DC
  Administered 2016-10-10: 13:00:00 via INTRAVENOUS
  Filled 2016-10-10: qty 1000

## 2016-10-10 NOTE — ED Provider Notes (Signed)
[facility] Provider Note   CSN: [id number] Arrival date & time: 10/10/16  [id number]     History   Chief Complaint Chief Complaint  Patient presents with  . Dizziness    HPI Avie Arenasoie J Reiland is a 50 y.o. female.  The history is provided by the patient and medical records.  Dizziness  Associated symptoms: nausea and vomiting      50 year old female with history of anemia, hemorrhoids, hypertension, hyperlipidemia, vertigo, presenting to the ED for dizziness. She reports this began 3 days ago. She describes it as a room spinning sensation. Symptoms are worse with movement or turning her head. States she has been experiencing some nausea and nonbloody, nonbilious emesis. She denies any focal numbness, weakness, gait disturbance, confusion, changes in speech, blurred vision, tinnitus, fever, chills, or neck pain. She has no history of TIA or stroke. She is not currently on anticoagulation. Patient does report history of vertigo in the past, not currently on any medication for this. States symptoms do feel consistent with prior episodes of vertigo.  She denies any chest pain or shortness of breath. She has had a mild dry cough as well. No sick contacts.  Past Medical History:  Diagnosis Date  . 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 Anemia   . Constipation   . Hemorrhoids   . HTN (hypertension)   . Hyperlipidemia   . Seasonal allergies 08-13-13   tx. Claritin  . Vertigo     Patient Active Problem List   Diagnosis Date Noted  . Right arm pain 04/19/2014  . Disturbance of skin sensation 04/19/2014  . Anal fistula 04/14/2013  . UNSPECIFIED SITE OF SPRAIN AND STRAIN 07/07/2009  . PEDAL EDEMA 06/29/2009  . MUSCLE PAIN 06/23/2009  . 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 ALLERGIC RHINITIS 03/11/2009  . SYNCOPE, VASOVAGAL 03/11/2009  . HEADACHE 03/11/2009    Past Surgical History:  Procedure Laterality Date  . ANAL FISTULECTOMY  08/22/2012   Procedure: FISTULECTOMY ANAL;  Surgeon: Lodema PilotBrian  Layton, DO;  Location: [facility];  Service: General;  Laterality: N/A;  rectal examination under anesthesia with seton placement   . ANAL FISTULECTOMY  09/17/2012   Procedure: FISTULECTOMY ANAL;  Surgeon: Lodema PilotBrian Layton, DO;  Location: [facility];  Service: General;  Laterality: N/A;  possible fistulotomy  . D & C HYSTEROSCOPY/ NOVASURE ENDOMETRIAL ABLATION/ I & D THROMBOSED HEMORROID  01-10-2007  DR Miguel AschoffALLAN ROSS  . EXAMINATION UNDER ANESTHESIA  09/17/2012   Procedure: EXAM UNDER ANESTHESIA;  Surgeon: Lodema PilotBrian Layton, DO;  Location: [facility];  Service: General;  Laterality: N/A;  Rectal exam under anesthesia  . EXAMINATION UNDER ANESTHESIA N/A 05/14/2013   Procedure: EXAM UNDER ANESTHESIA;  Surgeon: Romie LeveeAlicia Thomas, MD;  [facility] [place];  Service: General;  Laterality: N/A;  . FISTULA PLUG  09/17/2012   Procedure: FISTULA PLUG;  Surgeon: Lodema PilotBrian Layton, DO;  Location: [facility];  Service: General;  Laterality: N/A;  Possible fistula plug  . HEMORRHOID SURGERY  04-28-2009   [facility] INTERNAL HEMORRHOIDS  . IRRIGATION AND DEBRIDEMENT ABSCESS  07/08/2012   Procedure: MINOR INCISION AND DRAINAGE OF ABSCESS;  Surgeon: Geryl RankinsEvelyn Varnado, MD;  [facility] [place];  Service: Gynecology;  Laterality: N/A;  Vulvar Abscess  . PLACEMENT OF SETON N/A 05/14/2013   Procedure:  PLACEMENT OF SETON;  Surgeon: Romie LeveeAlicia Thomas, MD;  [facility] [place];  Service: General;  Laterality: N/A;  . RECTAL EXAM UNDER ANESTHESIA N/A 08/20/2013   Procedure:  mucosal advancement flap ;  Surgeon: Romie LeveeAlicia Thomas, MD;  [facility];  Service: General;  Laterality: N/A;  parks anal retractor long rectal instrucments prone jack knife anal fistula   . RECTAL ULTRASOUND N/A 07/20/2013   Procedure: RECTAL ULTRASOUND;  Surgeon: Romie LeveeAlicia Thomas, MD;  [facility];  Service: Endoscopy;  Laterality: N/A;    OB History    No data available        Home Medications    Prior to Admission medications   Medication Sig Start Date End Date Taking? Authorizing Provider  benzonatate (TESSALON) 100 MG capsule Take 2 capsules (200 mg total) by mouth 2 (two) times daily as needed for cough. 12/01/14   Roxy Horsemanobert Browning, PA-C  docusate sodium (COLACE) 100 MG capsule Take 1 capsule (100 mg total) by mouth 2 (two) times daily. Patient taking differently: Take 100 mg by mouth daily.  11/20/13   Romie LeveeAlicia Thomas, MD  doxycycline (VIBRAMYCIN) 100 MG capsule Take 1 capsule (100 mg total) by mouth 2 (two) times daily. 03/10/16   Derwood KaplanAnkit Nanavati, MD  gabapentin (NEURONTIN) 300 MG capsule Take 300 mg by mouth daily.    Historical Provider, MD  gemfibrozil (LOPID) 600 MG tablet Take 600 mg by mouth 2 (two) times daily before a meal.    Historical Provider, MD  HYDROcodone-acetaminophen (NORCO) 10-325 MG per tablet Take 1 tablet by mouth 2 (two) times daily. 01/27/15   Historical Provider, MD  ketotifen (ZADITOR) 0.025 % ophthalmic solution Place 1 drop into both eyes daily.    Historical Provider, MD  lisinopril-hydrochlorothiazide (PRINZIDE,ZESTORETIC) 20-25 MG per tablet Take 1 tablet by mouth daily. 02/23/15   Historical Provider, MD  loratadine (CLARITIN) 10 MG tablet Take 10 mg by mouth daily.    Historical Provider, MD  ondansetron (ZOFRAN) 4 MG tablet Take 1 tablet (4 mg total) by mouth every 4 (four) hours as needed for nausea or vomiting. 03/07/15   Hilarie FredricksonJohn N Perry, MD  oxyCODONE-acetaminophen (PERCOCET/ROXICET) 5-325 MG per tablet Take 2 tablets by mouth every 6 (six) hours as needed for severe pain. 12/01/14   Roxy Horsemanobert Browning, PA-C  QUASENSE 0.15-0.03 MG tablet Take 1 tablet by mouth daily. 02/14/15   Historical Provider, MD  traMADol (ULTRAM) 50 MG tablet Take 1 tablet by mouth 2 (two) times daily as needed. 11/25/14   Historical Provider, MD    Family History Family History  Problem Relation Age of Onset  . Diabetes Mother   . Diabetes Brother   .  Diabetes Maternal Uncle   . Diabetes Maternal Aunt   . Colon cancer Neg Hx   . Stomach cancer Neg Hx     Social History Social History  Substance Use Topics  . Smoking status: Never Smoker  . Smokeless tobacco: Never Used  . Alcohol use 0.0 oz/week     Comment: occa     Allergies   Ampicillin; Penicillins; Sulfonamide derivatives; Hydrocodone-acetaminophen; Tramadol; and Tylenol [acetaminophen]   Review of Systems Review of Systems  Gastrointestinal: Positive for nausea and vomiting.  Neurological: Positive for dizziness.  All other systems reviewed and are negative.    Physical Exam Updated Vital Signs BP 128/88 (BP Location: Right Arm)   Pulse 80   Temp 98 F (36.7 C) (Oral)   Resp 18   SpO2 98%   Physical Exam  Constitutional: She is oriented to person, place, and time. She appears well-developed and well-nourished. No distress.  HENT:  Head: Normocephalic and atraumatic.  Right Ear: Tympanic membrane and external  ear normal.  Left Ear: Tympanic membrane and external ear normal.  Nose: Nose normal.  Mouth/Throat: Uvula is midline, oropharynx is clear and moist and mucous membranes are normal.  EOMs intact, no nystagmus, normal confrontation, no field cuts, pupils symmetric and reactive bilaterally  Eyes: Conjunctivae and EOM are normal. Pupils are equal, round, and reactive to light.  Neck: Normal range of motion and full passive range of motion without pain. Neck supple. No neck rigidity.  No rigidity, no meningismus  Cardiovascular: Normal rate, regular rhythm and normal heart sounds.   No murmur heard. Pulmonary/Chest: Effort normal and breath sounds normal. No respiratory distress. She has no wheezes. She has no rhonchi.  Abdominal: Soft. Bowel sounds are normal. There is no tenderness. There is no guarding.  Musculoskeletal: Normal range of motion. She exhibits no edema.  Neurological: She is alert and oriented to person, place, and time. She has normal  strength. She displays no tremor. No cranial nerve deficit or sensory deficit. She displays no seizure activity.  AAOx3, answering questions and following commands appropriately; equal strength UE and LE bilaterally; CN grossly intact; moves all extremities appropriately without ataxia; no focal neuro deficits or facial asymmetry appreciated  Skin: Skin is warm and dry. No rash noted. She is not diaphoretic.  Psychiatric: She has a normal mood and affect. Her behavior is normal. Thought content normal.  Nursing note and vitals reviewed.    ED Treatments / Results  Labs (all labs ordered are listed, but only abnormal results are displayed) Labs Reviewed  BASIC METABOLIC PANEL - Abnormal; Notable for the following:       Result Value   Potassium 2.7 (*)    Chloride 97 (*)    All other components within normal limits  CBC WITH DIFFERENTIAL/PLATELET  Rosezena Sensor, ED    EKG  EKG Interpretation  Date/Time:  Wednesday October 10 2016 10:51:36 EST Ventricular Rate:  78 PR Interval:    QRS Duration: 85 QT Interval:  413 QTC Calculation: 471 R Axis:   57 Text Interpretation:  Sinus rhythm Low voltage, precordial leads Borderline T abnormalities, anterior leads No significant change since last tracing Confirmed by LITTLE MD, RACHEL ([id number]) on 10/10/2016 10:55:58 AM Also confirmed by LITTLE MD, RACHEL [phone]), editor Bushnell, Cala Bradford [phone])  on 10/10/2016 11:38:48 AM       Radiology Dg Chest 2 View  Result Date: 10/10/2016 CLINICAL DATA:  Dizziness. EXAM: CHEST  2 VIEW COMPARISON:  08/08/2015. FINDINGS: Mediastinum and hilar structures normal. Lungs are clear. Left base subsegmental atelectasis. Heart size normal. Mild left base subsegmental atelectasis. No acute bony abnormality . IMPRESSION: Mild left base subsegmental atelectasis. Electronically Signed   By: Maisie Fus  Register   On: 10/10/2016 12:24    Procedures Procedures (including critical care time)  Medications Ordered in  ED Medications  0.9 % NaCl with KCl 20 mEq/ L  infusion ( Intravenous Stopped 10/10/16 1527)  sodium chloride 0.9 % bolus 1,000 mL (0 mLs Intravenous Stopped 10/10/16 1254)  meclizine (ANTIVERT) tablet 25 mg (25 mg Oral Given 10/10/16 1133)  ondansetron (ZOFRAN) injection 4 mg (4 mg Intravenous Given 10/10/16 1133)  potassium chloride SA (K-DUR,KLOR-CON) CR tablet 40 mEq (40 mEq Oral Given 10/10/16 1256)  diazepam (VALIUM) tablet 5 mg (5 mg Oral Given 10/10/16 1527)     Initial Impression / Assessment and Plan / ED Course  I have reviewed the triage vital signs and the nursing notes.  Pertinent labs & imaging results that were available during  my care of the patient were reviewed by me and considered in my medical decision making (see chart for details).  Clinical Course    50 year old female here with dizziness for the past 3 days. Reports room spinning sensation, worse with movement, associated nausea and vomiting. Hx of vertigo with similar symptoms.  She is afebrile and nontoxic.  Neurologic exam is non-focal.  No clinical signs/symptoms concerning for meningitis.  Feel this is likely BPPV given history and symptoms.  Will treat as such.  Basic labs and CXR pending given her cough.  EKG reassuring.  IVF and meclizine ordered.  After treatment with IV fluids and meclizine, some improvement but still slightly dizzy.  Remains neurologically intact.  Suspicion for acute CVA remains low.  Additional valium ordered. Labs with hypokalemia 2.7. Patient is on lisinopril/HCTZ combination pill, this is likely the source of this. Chest x-ray is clear. Patient will be given IV and oral potassium repletion.   3:30 PM IV K+ finishing.  Valium yet to be given.  Care signed out to PA Ward at shift change-- will reassess and disposition.  Feel patient likely can be discharged with symptomatic care and neurology referral once feeling better.  Recommend to follow-up with PCP for re-check of K+ which I have  discussed with patient.  Final Clinical Impressions(s) / ED Diagnoses   Final diagnoses:  Dizziness  Hypokalemia    New Prescriptions New Prescriptions   No medications on file     Garlon Hatchet, Cordelia Poche 10/10/16 1535    Laurence Spates, MD 10/10/16 [phone]

## 2016-10-10 NOTE — ED Notes (Signed)
Patient transported to X-ray 

## 2016-10-10 NOTE — ED Provider Notes (Signed)
Care assumed from previous provider PA Sanders. Please see note for further details. Briefly, patient with symptoms c/w BPPV with hx of same. Noted to have low K+ but replenished in ED today. Given meclizine which improved but did not resolve symptoms. Patient about to receive valium then will reassess. Case discussed, plan agreed upon. Will re-evaluate patient following valium.   Patient re-evaluated. Still feels somewhat dizzy but valium did help. Patient would like to go home. She is able to ambulate with steady gait. All questions answered.    [facility] HospitalJaime Dawson Hadassah Rana, PA-C 10/10/16 1620    Benjiman CoreNathan Pickering, MD 10/10/16 [phone]

## 2016-10-10 NOTE — ED Notes (Signed)
Pt given ginger ale per DimmittLisa PA

## 2016-10-10 NOTE — ED Triage Notes (Signed)
Pt reports dizziness x 3 days and now having n/v. Reports that dizziness increases when turning her head and feels like the room is spinning. Hx of vertigo and states this feels just like her vertigo in past but she no longer has her medications for it. No neuro deficits noted at triage.

## 2016-10-10 NOTE — Discharge Instructions (Signed)
Take the prescribed medication as directed for your dizziness. Make sure you're drinking plenty of fluids to stay hydrated. Your potassium was low today, we have given you IV and oral repletion. I recommend that you have this rechecked with your primary care doctor. Follow-up with neurology, especially if you continue having issues with vertigo/dizziness. Return to the ED for new or worsening symptoms.

## 2016-10-18 ENCOUNTER — Encounter: Payer: Self-pay | Admitting: Neurology

## 2016-10-18 ENCOUNTER — Ambulatory Visit (INDEPENDENT_AMBULATORY_CARE_PROVIDER_SITE_OTHER): Payer: Self-pay | Admitting: Neurology

## 2016-10-18 VITALS — BP 138/87 | HR 87 | Ht 64.0 in | Wt 151.5 lb

## 2016-10-18 DIAGNOSIS — E876 Hypokalemia: Secondary | ICD-10-CM

## 2016-10-18 DIAGNOSIS — R42 Dizziness and giddiness: Secondary | ICD-10-CM

## 2016-10-18 MED ORDER — POTASSIUM CHLORIDE ER 10 MEQ PO CPCR: 10.0000 meq | ORAL_CAPSULE | Freq: Every day | ORAL | 3 refills | Status: DC

## 2016-10-18 MED ORDER — ALPRAZOLAM 0.5 MG PO TABS: ORAL_TABLET | ORAL | 0 refills | Status: DC

## 2016-10-18 NOTE — Patient Instructions (Signed)
   We will get MRI of the brain and blood work today.

## 2016-10-18 NOTE — Progress Notes (Signed)
Reason for visit: Vertigo  Referring physician: [place]  Avie Arenasoie J Facundo is a 50 y.o. female  History of present illness:  [name] is a 50 year old right-handed black female with a history of onset of vertigo that began spontaneously on the third of December 2017. The patient claims that she woke up with the symptoms, and she had some nausea, but she denies any vomiting. The patient noted no other symptoms of numbness, weakness, headache, double vision, speech changes, or difficulty swallowing. The patient did not have any neck stiffness or difficulty with memory or concentration. She did not note any particular head position that would bring on the dizziness, but the vertigo is present at all times. The patient has noted some gait instability, she has not driven a car. She went to the emergency room 3 days later on December 6 for medical attention and was noted to have a potassium level of 2.7. CT or MRI evaluation of the brain was not done. The patient had IV supplementation of her potassium, but she was not placed on potassium tablets, she is on chlorthalidone for blood pressure issues. The patient denies any double vision or loss of vision. She denies any history of syncope. The patient has not had any problems with controlling the bowels or the bladder. She denies any pre-existing history of dizziness.  Past Medical History:  Diagnosis Date  . 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 Anemia   . Constipation   . Hemorrhoids   . HTN (hypertension)   . Hyperlipidemia   . Seasonal allergies 08-13-13   tx. Claritin  . Vertigo     Past Surgical History:  Procedure Laterality Date  . ANAL FISTULECTOMY  08/22/2012   Procedure: FISTULECTOMY ANAL;  Surgeon: Lodema PilotBrian Layton, DO;  Location: [facility];  Service: General;  Laterality: N/A;  rectal examination under anesthesia with seton placement   . ANAL FISTULECTOMY  09/17/2012   Procedure: FISTULECTOMY ANAL;  Surgeon: Lodema PilotBrian Layton, DO;  Location: [facility];  Service: General;  Laterality: N/A;  possible fistulotomy  . D & C HYSTEROSCOPY/ NOVASURE ENDOMETRIAL ABLATION/ I & D THROMBOSED HEMORROID  01-10-2007  DR Miguel AschoffALLAN ROSS  . EXAMINATION UNDER ANESTHESIA  09/17/2012   Procedure: EXAM UNDER ANESTHESIA;  Surgeon: Lodema PilotBrian Layton, DO;  Location: [facility];  Service: General;  Laterality: N/A;  Rectal exam under anesthesia  . EXAMINATION UNDER ANESTHESIA N/A 05/14/2013   Procedure: EXAM UNDER ANESTHESIA;  Surgeon: Romie LeveeAlicia Thomas, MD;  [facility] [place];  Service: General;  Laterality: N/A;  . FISTULA PLUG  09/17/2012   Procedure: FISTULA PLUG;  Surgeon: Lodema PilotBrian Layton, DO;  Location: [facility];  Service: General;  Laterality: N/A;  Possible fistula plug  . HEMORRHOID SURGERY  04-28-2009   [facility] INTERNAL HEMORRHOIDS  . IRRIGATION AND DEBRIDEMENT ABSCESS  07/08/2012   Procedure: MINOR INCISION AND DRAINAGE OF ABSCESS;  Surgeon: Geryl RankinsEvelyn Varnado, MD;  [facility] [place];  Service: Gynecology;  Laterality: N/A;  Vulvar Abscess  . PLACEMENT OF SETON N/A 05/14/2013   Procedure:  PLACEMENT OF SETON;  Surgeon: Romie LeveeAlicia Thomas, MD;  [facility] [place];  Service: General;  Laterality: N/A;  . RECTAL EXAM UNDER ANESTHESIA N/A 08/20/2013   Procedure: mucosal advancement flap ;  Surgeon: Romie LeveeAlicia Thomas, MD;  Location: [facility];  Service: General;  Laterality: N/A;  parks anal retractor long rectal instrucments prone jack knife anal fistula   . RECTAL ULTRASOUND N/A 07/20/2013  Procedure: RECTAL ULTRASOUND;  Surgeon: Romie LeveeAlicia Thomas, MD;  Location: [facility];  Service: Endoscopy;  Laterality: N/A;    Family History  Problem Relation Age of Onset  . Diabetes Mother   . Heart attack Father   . Diabetes Brother   . Diabetes Maternal Uncle   . Diabetes Maternal Aunt   . Colon cancer Neg Hx   . Stomach cancer Neg Hx     Social history:  reports that she has never smoked. She has never  used smokeless tobacco. She reports that she drinks alcohol. She reports that she does not use drugs.  Medications:  Prior to Admission medications   Medication Sig Start Date End Date Taking? Authorizing Provider  amLODipine (NORVASC) 5 MG tablet Take 5 mg by mouth daily.   Yes Historical Provider, MD  benzonatate (TESSALON) 100 MG capsule Take 2 capsules (200 mg total) by mouth 2 (two) times daily as needed for cough. 12/01/14  Yes Roxy Horsemanobert Browning, PA-C  chlorthalidone (HYGROTON) 25 MG tablet Take 25 mg by mouth daily.   Yes Historical Provider, MD  docusate sodium (COLACE) 100 MG capsule Take 1 capsule (100 mg total) by mouth 2 (two) times daily. 11/20/13  Yes Romie LeveeAlicia Thomas, MD  doxycycline (VIBRAMYCIN) 100 MG capsule Take 1 capsule (100 mg total) by mouth 2 (two) times daily. 03/10/16  Yes Derwood KaplanAnkit Nanavati, MD  estradiol (ESTRACE) 0.5 MG tablet Take 0.5 mg by mouth daily.   Yes Historical Provider, MD  loratadine (CLARITIN) 10 MG tablet Take 10 mg by mouth daily.   Yes Historical Provider, MD  meclizine (ANTIVERT) 25 MG tablet Take 1 tablet (25 mg total) by mouth 3 (three) times daily as needed for dizziness. 10/10/16  Yes Garlon HatchetLisa M Sanders, PA-C  medroxyPROGESTERone (PROVERA) 2.5 MG tablet Take 2.5 mg by mouth daily.   Yes Historical Provider, MD  ondansetron (ZOFRAN ODT) 4 MG disintegrating tablet Take 1 tablet (4 mg total) by mouth every 8 (eight) hours as needed for nausea. 10/10/16  Yes Garlon HatchetLisa M Sanders, PA-C  ondansetron (ZOFRAN) 4 MG tablet Take 1 tablet (4 mg total) by mouth every 4 (four) hours as needed for nausea or vomiting. 03/07/15  Yes Hilarie FredricksonJohn N Perry, MD  oxyCODONE-acetaminophen (PERCOCET/ROXICET) 5-325 MG per tablet Take 2 tablets by mouth every 6 (six) hours as needed for severe pain. 12/01/14  Yes Roxy Horsemanobert Browning, PA-C      Allergies  Allergen Reactions  . Ampicillin Anaphylaxis  . Penicillins Anaphylaxis  . Sulfonamide Derivatives Anaphylaxis  . Hydrocodone-Acetaminophen Hives    Have  to take a Benadryl to take med  . Tramadol Itching  . Tylenol [Acetaminophen] Itching    ROS:  Out of a complete 14 system review of symptoms, the patient complains only of the following symptoms, and all other reviewed systems are negative.  Fatigue Swelling in the legs Spinning sensations Shortness of breath, cough, snoring Constipation Anemia Feeling hot, increased thirst Muscle cramps Allergies Numbness, dizziness depression    Blood pressure 138/87, pulse 87, height 5\' 4"  (1.626 m), weight 151 lb 8 oz (68.7 kg).  Physical Exam  General: The patient is alert and cooperative at the time of the examination.  Eyes: Pupils are equal, round, and reactive to light. Discs are flat bilaterally.  Ears: Tympanic membranes were clear bilaterally  Neck: The neck is supple, no carotid bruits are noted.  Respiratory: The respiratory examination is clear.  Cardiovascular: The cardiovascular examination reveals a regular rate and rhythm, no obvious murmurs or rubs  are noted.  Skin: Extremities are without significant edema.  Neurologic Exam  Mental status: The patient is alert and oriented x 3 at the time of the examination. The patient has apparent normal recent and remote memory, with an apparently normal attention span and concentration ability.  Cranial nerves: Facial symmetry is present. There is good sensation of the face to pinprick and soft touch bilaterally. The strength of the facial muscles and the muscles to head turning and shoulder shrug are normal bilaterally. Speech is well enunciated, no aphasia or dysarthria is noted. Extraocular movements are full. Visual fields are full. The tongue is midline, and the patient has symmetric elevation of the soft palate. No obvious hearing deficits are noted.  Motor: The motor testing reveals 5 over 5 strength of all 4 extremities. Good symmetric motor tone is noted throughout.  Sensory: Sensory testing is intact to pinprick,  soft touch, vibration sensation, and position sense on all 4 extremities. No evidence of extinction is noted.  Coordination: Cerebellar testing reveals good finger-nose-finger and heel-to-shin bilaterally. The Nyan-Barrany procedure was negative. The patient did report some dizziness, only minimal and gaze nystagmus with a rotatory component was noted bilaterally, and in a symmetric fashion.   Gait and station: Gait is normal. Tandem gait is slightly unsteady. Romberg is negative. No drift is seen.  Reflexes: Deep tendon reflexes are symmetric and normal bilaterally. Toes are downgoing bilaterally.   Assessment/Plan:  1. Vertigo    2. Hypokalemia   The patient has reported vertigo, she is on meclizine at this time and she indicates that this is not helpful for her dizziness. She has no other associated symptoms. Clinical examination is unremarkable, she does not have significant nystagmus even though she reports dizziness. For this reason, a central etiology of dizziness needs to be considered. The patient will be set up for MRI of the brain with and without gadolinium enhancement. Blood work will be rechecked today to evaluate the potassium level and potassium supplementation was given. If the blood work is unremarkable, we may consider a brief trial on prednisone. The patient will follow-up in 4 weeks.  Marlan Palau MD 10/18/2016 8:22 AM  [facility] [address] [place], [place] [address]  Phone [phone] Fax [phone]

## 2016-10-19 LAB — COMPREHENSIVE METABOLIC PANEL
A/G RATIO: 1.5 (ref 1.2–2.2)
ALBUMIN: 4.6 g/dL (ref 3.5–5.5)
ALT: 12 IU/L (ref 0–32)
AST: 16 IU/L (ref 0–40)
Alkaline Phosphatase: 63 IU/L (ref 39–117)
BILIRUBIN TOTAL: 0.3 mg/dL (ref 0.0–1.2)
BUN / CREAT RATIO: 14 (ref 9–23)
BUN: 11 mg/dL (ref 6–24)
CO2: 26 mmol/L (ref 18–29)
Calcium: 10.2 mg/dL (ref 8.7–10.2)
Chloride: 95 mmol/L — ABNORMAL LOW (ref 96–106)
Creatinine, Ser: 0.79 mg/dL (ref 0.57–1.00)
GFR calc non Af Amer: 86 mL/min/{1.73_m2} (ref >59)
GFR, EST AFRICAN AMERICAN: 100 mL/min/{1.73_m2} (ref >59)
GLOBULIN, TOTAL: 3 g/dL (ref 1.5–4.5)
Glucose: 105 mg/dL — ABNORMAL HIGH (ref 65–99)
POTASSIUM: 3.6 mmol/L (ref 3.5–5.2)
Sodium: 142 mmol/L (ref 134–144)
TOTAL PROTEIN: 7.6 g/dL (ref 6.0–8.5)

## 2016-10-19 LAB — SEDIMENTATION RATE: Sed Rate: 8 mm/hr (ref 0–40)

## 2016-10-22 ENCOUNTER — Telehealth: Payer: Self-pay

## 2016-10-22 NOTE — Telephone Encounter (Signed)
-----   Message from York Spanielharles K Willis, MD sent at 10/19/2016  1:18 PM EST -----  The blood work results are unremarkable, with exception of minimally low chloride level, not clinically significant. Potassium is now within normal range. Please call the patient. ----- Message ----- From: Nell RangeInterface, [facility] Lab Results In Sent: 10/19/2016   7:43 AM To: York Spanielharles K Willis, MD

## 2016-10-22 NOTE — Telephone Encounter (Signed)
Called pt w/ lab results. Verbalized understanding and appreciation for call. 

## 2016-10-26 ENCOUNTER — Ambulatory Visit
Admission: RE | Admit: 2016-10-26 | Discharge: 2016-10-26 | Disposition: A | Discharge status: Home / Self Care | Payer: Medicaid Other | Source: Ambulatory Visit | Attending: Neurology | Admitting: Neurology

## 2016-10-26 DIAGNOSIS — R42 Dizziness and giddiness: Secondary | ICD-10-CM

## 2016-10-26 MED ORDER — GADOBENATE DIMEGLUMINE 529 MG/ML IV SOLN
14.0000 mL | Freq: Once | INTRAVENOUS | Status: AC | PRN
  Administered 2016-10-26: 14 mL via INTRAVENOUS

## 2016-10-30 ENCOUNTER — Telehealth: Payer: Self-pay | Admitting: Neurology

## 2016-10-30 NOTE — Telephone Encounter (Signed)
I called the patient. MRI of the brain is normal. Potassium level was normal. If the dizziness continues, may give a trial on prednisone.   MRI brain 10/26/16:  IMPRESSION:  This is a normal MRI of the brain with and without contrast.

## 2016-11-09 ENCOUNTER — Ambulatory Visit: Payer: Self-pay | Admitting: Neurology

## 2016-12-04 ENCOUNTER — Ambulatory Visit: Payer: Medicaid Other | Admitting: Adult Health

## 2016-12-12 ENCOUNTER — Encounter: Payer: Self-pay | Admitting: Family Medicine

## 2016-12-12 ENCOUNTER — Ambulatory Visit: Payer: Self-pay | Attending: Family Medicine | Admitting: Family Medicine

## 2016-12-12 VITALS — BP 115/77 | HR 94 | Temp 98.2°F | Resp 18 | Ht 64.0 in | Wt 157.0 lb

## 2016-12-12 DIAGNOSIS — G8929 Other chronic pain: Secondary | ICD-10-CM | POA: Insufficient documentation

## 2016-12-12 DIAGNOSIS — M549 Dorsalgia, unspecified: Secondary | ICD-10-CM | POA: Insufficient documentation

## 2016-12-12 DIAGNOSIS — I10 Essential (primary) hypertension: Secondary | ICD-10-CM | POA: Insufficient documentation

## 2016-12-12 DIAGNOSIS — Z79899 Other long term (current) drug therapy: Secondary | ICD-10-CM | POA: Insufficient documentation

## 2016-12-12 MED ORDER — CHLORTHALIDONE 25 MG PO TABS: 25.0000 mg | ORAL_TABLET | Freq: Every day | ORAL | 0 refills | Status: DC

## 2016-12-12 MED ORDER — AMLODIPINE BESYLATE 10 MG PO TABS: 10.0000 mg | ORAL_TABLET | Freq: Every day | ORAL | 0 refills | Status: DC

## 2016-12-12 MED ORDER — NAPROXEN 500 MG PO TABS: 500.0000 mg | ORAL_TABLET | Freq: Two times a day (BID) | ORAL | 0 refills | Status: DC

## 2016-12-12 MED FILL — ?CHLORTHALIDONE 25 MG TABLE: 25 | 30 days supply | Qty: 30 | Fill #0

## 2016-12-12 MED FILL — AMLODIPINE BESYLATE 10 MG T: 10 | 30 days supply | Qty: 30 | Fill #0

## 2016-12-12 MED FILL — NAPROXEN 500 MG TABLET: 500 | 30 days supply | Qty: 60 | Fill #0

## 2016-12-12 NOTE — Progress Notes (Signed)
Patient is here for establish care  Patient complains about back pain that comes and goes its been going on since march 2015  Pain level is a 5  Patient need meds refill

## 2016-12-12 NOTE — Patient Instructions (Signed)
Back Pain, Adult Introduction Back pain is very common. The pain often gets better over time. The cause of back pain is usually not dangerous. Most people can learn to manage their back pain on their own. Follow these instructions at home: Watch your back pain for any changes. The following actions may help to lessen any pain you are feeling:  Stay active. Start with short walks on flat ground if you can. Try to walk farther each day.  Exercise regularly as told by your doctor. Exercise helps your back heal faster. It also helps avoid future injury by keeping your muscles strong and flexible.  Do not sit, drive, or stand in one place for more than 30 minutes.  Do not stay in bed. Resting more than 1-2 days can slow down your recovery.  Be careful when you bend or lift an object. Use good form when lifting:  Bend at your knees.  Keep the object close to your body.  Do not twist.  Sleep on a firm mattress. Lie on your side, and bend your knees. If you lie on your back, put a pillow under your knees.  Take medicines only as told by your doctor.  Put ice on the injured area.  Put ice in a plastic bag.  Place a towel between your skin and the bag.  Leave the ice on for 20 minutes, 2-3 times a day for the first 2-3 days. After that, you can switch between ice and heat packs.  Avoid feeling anxious or stressed. Find good ways to deal with stress, such as exercise.  Maintain a healthy weight. Extra weight puts stress on your back. Contact a doctor if:  You have pain that does not go away with rest or medicine.  You have worsening pain that goes down into your legs or buttocks.  You have pain that does not get better in one week.  You have pain at night.  You lose weight.  You have a fever or chills. Get help right away if:  You cannot control when you poop (bowel movement) or pee (urinate).  Your arms or legs feel weak.  Your arms or legs lose feeling  (numbness).  You feel sick to your stomach (nauseous) or throw up (vomit).  You have belly (abdominal) pain.  You feel like you may pass out (faint). This information is not intended to replace advice given to you by your health care provider. Make sure you discuss any questions you have with your health care provider. Document Released: 04/09/2008 Document Revised: 03/29/2016 Document Reviewed: 02/23/2014  2017 Elsevier  

## 2016-12-12 NOTE — Progress Notes (Signed)
Subjective:  Patient ID: [name], female    DOB: 01-20-1966  Age: 51 y.o. MRN: [id number]  CC: Establish Care   HPI PAISLEIGH MARONEY presents for complaints of back pain.  Reports two-year history of back pain. Denies any history of back injury. Reports pain is worse in by prolonged walking and bending forward. Reports use of oral analgesics and narcotics in the past.  She denies any bowel or bladder incontinence.    Outpatient Medications Prior to Visit  Medication Sig Dispense Refill  . amLODipine (NORVASC) 5 MG tablet Take 5 mg by mouth daily.    . chlorthalidone (HYGROTON) 25 MG tablet Take 25 mg by mouth daily.    Marland Kitchen estradiol (ESTRACE) 0.5 MG tablet Take 0.5 mg by mouth daily.    Marland Kitchen loratadine (CLARITIN) 10 MG tablet Take 10 mg by mouth daily.    . medroxyPROGESTERone (PROVERA) 2.5 MG tablet Take 2.5 mg by mouth daily.    Marland Kitchen oxyCODONE-acetaminophen (PERCOCET/ROXICET) 5-325 MG per tablet Take 2 tablets by mouth every 6 (six) hours as needed for severe pain. 15 tablet 0  . potassium chloride (MICRO-K) 10 MEQ CR capsule Take 1 capsule (10 mEq total) by mouth daily. 30 capsule 3  . ALPRAZolam (XANAX) 0.5 MG tablet Take 2 tablets approximately 45 minutes prior to the MRI study, take a third tablet if needed. (Patient not taking: Reported on 12/12/2016) 3 tablet 0  . benzonatate (TESSALON) 100 MG capsule Take 2 capsules (200 mg total) by mouth 2 (two) times daily as needed for cough. (Patient not taking: Reported on 12/12/2016) 20 capsule 0  . docusate sodium (COLACE) 100 MG capsule Take 1 capsule (100 mg total) by mouth 2 (two) times daily. (Patient not taking: Reported on 12/12/2016) 60 capsule 3  . doxycycline (VIBRAMYCIN) 100 MG capsule Take 1 capsule (100 mg total) by mouth 2 (two) times daily. (Patient not taking: Reported on 12/12/2016) 20 capsule 0  . meclizine (ANTIVERT) 25 MG tablet Take 1 tablet (25 mg total) by mouth 3 (three) times daily as needed for dizziness. (Patient not taking:  Reported on 12/12/2016) 30 tablet 0  . ondansetron (ZOFRAN ODT) 4 MG disintegrating tablet Take 1 tablet (4 mg total) by mouth every 8 (eight) hours as needed for nausea. 10 tablet 0  . ondansetron (ZOFRAN) 4 MG tablet Take 1 tablet (4 mg total) by mouth every 4 (four) hours as needed for nausea or vomiting. 60 tablet 0   No facility-administered medications prior to visit.     ROS Review of Systems  Respiratory: Negative.   Cardiovascular: Negative.   Gastrointestinal: Negative.   Musculoskeletal: Positive for back pain.     Objective:  BP 115/77 (BP Location: Left Arm, Patient Position: Sitting, Cuff Size: Normal)   Pulse 94   Temp 98.2 F (36.8 C) (Oral)   Resp 18   Ht 5\' 4"  (1.626 m)   Wt 157 lb (71.2 kg)   SpO2 98%   BMI 26.95 kg/m   BP/Weight 12/12/2016 10/18/2016 10/10/2016  Systolic BP 115 138 121  Diastolic BP 77 87 85  Wt. (Lbs) 157 151.5 -  BMI 26.95 26 -     Physical Exam  Cardiovascular: Normal rate, regular rhythm, normal heart sounds and intact distal pulses.   Pulmonary/Chest: Effort normal and breath sounds normal.  Abdominal: Soft. Bowel sounds are normal.  Musculoskeletal:       Lumbar back: She exhibits pain. She exhibits no spasm.  Nursing note and vitals  reviewed.    Assessment & Plan:   Problem List Items Addressed This Visit    None    Visit Diagnoses    Chronic back pain greater than 3 months duration    -  Primary   Relevant Medications   naproxen (NAPROSYN) 500 MG tablet   Other Relevant Orders   DG Lumbar Spine Complete (Completed)   Ambulatory referral to Physical Therapy   Essential hypertension       Relevant Medications   amLODipine (NORVASC) 10 MG tablet   chlorthalidone (HYGROTON) 25 MG tablet      Meds ordered this encounter  Medications  . amLODipine (NORVASC) 10 MG tablet    Sig: Take 1 tablet (10 mg total) by mouth daily.    Dispense:  30 tablet    Refill:  0    Order Specific Question:   Supervising Provider     Answer:   Quentin AngstJEGEDE, OLUGBEMIGA E [id number]  . chlorthalidone (HYGROTON) 25 MG tablet    Sig: Take 1 tablet (25 mg total) by mouth daily.    Dispense:  30 tablet    Refill:  0    Order Specific Question:   Supervising Provider    Answer:   Quentin AngstJEGEDE, OLUGBEMIGA E [id number]  . naproxen (NAPROSYN) 500 MG tablet    Sig: Take 1 tablet (500 mg total) by mouth 2 (two) times daily with a meal.    Dispense:  60 tablet    Refill:  0    Order Specific Question:   Supervising Provider    Answer:   Quentin AngstJEGEDE, OLUGBEMIGA E [id number]    Follow-up: Return As needed. [name].   Isiah Scheel R Greig Altergott FNP

## 2016-12-13 ENCOUNTER — Telehealth: Payer: Self-pay | Admitting: Family Medicine

## 2016-12-13 ENCOUNTER — Ambulatory Visit (HOSPITAL_COMMUNITY)
Admission: RE | Admit: 2016-12-13 | Discharge: 2016-12-13 | Disposition: A | Discharge status: Home / Self Care | Payer: Medicaid Other | Source: Ambulatory Visit | Attending: Family Medicine | Admitting: Family Medicine

## 2016-12-13 DIAGNOSIS — M549 Dorsalgia, unspecified: Secondary | ICD-10-CM | POA: Insufficient documentation

## 2016-12-13 DIAGNOSIS — N200 Calculus of kidney: Secondary | ICD-10-CM | POA: Insufficient documentation

## 2016-12-13 DIAGNOSIS — G8929 Other chronic pain: Secondary | ICD-10-CM | POA: Insufficient documentation

## 2016-12-13 NOTE — Telephone Encounter (Signed)
Mailed patient CAFA application

## 2016-12-18 ENCOUNTER — Other Ambulatory Visit: Payer: Self-pay | Admitting: Family Medicine

## 2016-12-18 ENCOUNTER — Telehealth: Payer: Self-pay | Admitting: *Deleted

## 2016-12-18 DIAGNOSIS — N200 Calculus of kidney: Secondary | ICD-10-CM

## 2016-12-18 NOTE — Telephone Encounter (Signed)
Patient verified DOB Patient is aware of x-ray showing no bone abnormalities. Patient is aware of 9 mm kidney stone being noted in the left kidney. Patient advised to complete [facility] and [facility] paperwork and to schedule a financial appointment. Patient expressed her understanding and had no further questions.

## 2016-12-18 NOTE — Telephone Encounter (Signed)
-----   Message from Lizbeth BarkMandesia R Hairston, FNP sent at 12/18/2016  8:40 AM EST ----- -X-ray showed no abnormality of the bones. It did show a 9 mm kidney stone in the left kidney. You will be referred to urology.

## 2016-12-25 ENCOUNTER — Ambulatory Visit: Payer: Self-pay | Attending: Family Medicine

## 2017-01-17 ENCOUNTER — Other Ambulatory Visit: Payer: Self-pay | Admitting: Family Medicine

## 2017-01-17 DIAGNOSIS — I10 Essential (primary) hypertension: Secondary | ICD-10-CM

## 2017-01-17 MED FILL — ?CHLORTHALIDONE 25 MG TABLE: 25 | 30 days supply | Qty: 30 | Fill #0

## 2017-01-17 MED FILL — ?AMLODIPINE BESYLATE 10 MG: 10 | 30 days supply | Qty: 30 | Fill #0

## 2017-01-29 ENCOUNTER — Ambulatory Visit: Payer: Self-pay | Attending: Family Medicine | Admitting: Family Medicine

## 2017-01-29 ENCOUNTER — Other Ambulatory Visit: Payer: Self-pay | Admitting: Family Medicine

## 2017-01-29 VITALS — BP 122/86 | HR 93 | Temp 98.7°F | Resp 18 | Ht 64.0 in | Wt 150.4 lb

## 2017-01-29 DIAGNOSIS — K219 Gastro-esophageal reflux disease without esophagitis: Secondary | ICD-10-CM | POA: Insufficient documentation

## 2017-01-29 DIAGNOSIS — R112 Nausea with vomiting, unspecified: Secondary | ICD-10-CM | POA: Insufficient documentation

## 2017-01-29 DIAGNOSIS — I10 Essential (primary) hypertension: Secondary | ICD-10-CM | POA: Insufficient documentation

## 2017-01-29 DIAGNOSIS — K5900 Constipation, unspecified: Secondary | ICD-10-CM | POA: Insufficient documentation

## 2017-01-29 DIAGNOSIS — R1030 Lower abdominal pain, unspecified: Secondary | ICD-10-CM | POA: Insufficient documentation

## 2017-01-29 DIAGNOSIS — M549 Dorsalgia, unspecified: Principal | ICD-10-CM

## 2017-01-29 DIAGNOSIS — G8929 Other chronic pain: Secondary | ICD-10-CM

## 2017-01-29 LAB — POCT URINALYSIS DIPSTICK
BILIRUBIN UA: NEGATIVE
Glucose, UA: NEGATIVE
Ketones, UA: NEGATIVE
LEUKOCYTES UA: NEGATIVE
NITRITE UA: NEGATIVE
PH UA: 5.5 (ref 5.0–8.0)
Spec Grav, UA: 1.02 (ref 1.030–1.035)
UROBILINOGEN UA: 1 (ref ?–2.0)

## 2017-01-29 MED ORDER — SENNOSIDES-DOCUSATE SODIUM 8.6-50 MG PO TABS: 1.0000 | ORAL_TABLET | Freq: Every evening | ORAL | Status: DC | PRN

## 2017-01-29 MED ORDER — ONDANSETRON HCL 4 MG PO TABS: 4.0000 mg | ORAL_TABLET | Freq: Three times a day (TID) | ORAL | 0 refills | Status: DC | PRN

## 2017-01-29 MED ORDER — AMLODIPINE BESYLATE 10 MG PO TABS: 10.0000 mg | ORAL_TABLET | Freq: Every day | ORAL | 2 refills | Status: DC

## 2017-01-29 MED ORDER — ESOMEPRAZOLE MAGNESIUM 20 MG PO CPDR: 20.0000 mg | DELAYED_RELEASE_CAPSULE | Freq: Every day | ORAL | 1 refills | Status: DC

## 2017-01-29 MED FILL — ?ESOMEPRAZOLE MAG DR 20 MG: 20 | 30 days supply | Qty: 30 | Fill #0

## 2017-01-29 MED FILL — ?ONDANSETRON HCL 4 MG TABLE: 4 | 6 days supply | Qty: 20 | Fill #0

## 2017-01-29 NOTE — Progress Notes (Signed)
Patient is here for Vomiting  Patient has been vomiting since sat.  Patient has not taken medication today. Patient has eaten today.

## 2017-01-29 NOTE — Progress Notes (Signed)
Subjective:  Patient ID: [name], female    DOB: 10-Mar-1966  Age: 51 y.o. MRN: [id number]  CC: No chief complaint on file.   HPI [name] presents for    N/V : Symptoms began Saturday, vomiting and lower abdominal pain. Reports vomiting 2 episodes of vomiting and two episodes of dry heaves. Reports greasy foods after eating  fatty meal. Reports heartburn and constipation. Denies any constitutional symptoms, coffee ground emesis, or dysuria. Denies any diarrhea. Reports drinking gingerale to help with symptoms.     HTN: Adherence with anti-hypertensive medication at home. Denies any CP, SOB, or swelling of the BLE. Non-smoker.       Outpatient Medications Prior to Visit  Medication Sig Dispense Refill  . ALPRAZolam (XANAX) 0.5 MG tablet Take 2 tablets approximately 45 minutes prior to the MRI study, take a third tablet if needed. (Patient not taking: Reported on 12/12/2016) 3 tablet 0  . benzonatate (TESSALON) 100 MG capsule Take 2 capsules (200 mg total) by mouth 2 (two) times daily as needed for cough. (Patient not taking: Reported on 12/12/2016) 20 capsule 0  . chlorthalidone (HYGROTON) 25 MG tablet TAKE 1 TABLET BY MOUTH DAILY. 30 tablet 0  . docusate sodium (COLACE) 100 MG capsule Take 1 capsule (100 mg total) by mouth 2 (two) times daily. (Patient not taking: Reported on 12/12/2016) 60 capsule 3  . doxycycline (VIBRAMYCIN) 100 MG capsule Take 1 capsule (100 mg total) by mouth 2 (two) times daily. (Patient not taking: Reported on 12/12/2016) 20 capsule 0  . estradiol (ESTRACE) 0.5 MG tablet Take 0.5 mg by mouth daily.    Marland Kitchen loratadine (CLARITIN) 10 MG tablet Take 10 mg by mouth daily.    . meclizine (ANTIVERT) 25 MG tablet Take 1 tablet (25 mg total) by mouth 3 (three) times daily as needed for dizziness. (Patient not taking: Reported on 12/12/2016) 30 tablet 0  . medroxyPROGESTERone (PROVERA) 2.5 MG tablet Take 2.5 mg by mouth daily.    . ondansetron (ZOFRAN ODT) 4 MG disintegrating  tablet Take 1 tablet (4 mg total) by mouth every 8 (eight) hours as needed for nausea. 10 tablet 0  . oxyCODONE-acetaminophen (PERCOCET/ROXICET) 5-325 MG per tablet Take 2 tablets by mouth every 6 (six) hours as needed for severe pain. 15 tablet 0  . potassium chloride (MICRO-K) 10 MEQ CR capsule Take 1 capsule (10 mEq total) by mouth daily. 30 capsule 3  . amLODipine (NORVASC) 10 MG tablet TAKE 1 TABLET BY MOUTH DAILY. 30 tablet 0  . naproxen (NAPROSYN) 500 MG tablet Take 1 tablet (500 mg total) by mouth 2 (two) times daily with a meal. 60 tablet 0  . ondansetron (ZOFRAN) 4 MG tablet Take 1 tablet (4 mg total) by mouth every 4 (four) hours as needed for nausea or vomiting. 60 tablet 0   No facility-administered medications prior to visit.     ROS Review of Systems  Constitutional: Negative.   Respiratory: Negative.   Cardiovascular: Negative.   Gastrointestinal: Positive for abdominal pain, constipation, nausea and vomiting.  Genitourinary: Negative.   Skin: Negative.    Objective:  BP 122/86 (BP Location: Left Arm, Patient Position: Sitting, Cuff Size: Normal)   Pulse 93   Temp 98.7 F (37.1 C) (Oral)   Resp 18   Ht '5\' 4"'  (1.626 m)   Wt 150 lb 6.4 oz (68.2 kg)   SpO2 99%   BMI 25.82 kg/m   BP/Weight 01/29/2017 12/12/2016 99/35/7017  Systolic BP 793  335 825  Diastolic BP 86 77 87  Wt. (Lbs) 150.4 157 151.5  BMI 25.82 26.95 26     Physical Exam  HENT:  Head: Normocephalic and atraumatic.  Right Ear: External ear normal.  Left Ear: External ear normal.  Nose: Nose normal.  Mouth/Throat: Oropharynx is clear and moist.  Eyes: Conjunctivae are normal.  Neck: No JVD present.  Cardiovascular: Normal rate, regular rhythm, normal heart sounds and intact distal pulses.   Pulmonary/Chest: Effort normal and breath sounds normal.  Abdominal: Soft. Bowel sounds are normal. There is tenderness.  Skin: Skin is warm and dry.  Nursing note and vitals reviewed.   Assessment & Plan:     Problem List Items Addressed This Visit    None    Visit Diagnoses    Lower abdominal pain    -  Primary   Relevant Orders   Urinalysis Dipstick (Completed)   CBC with Differential (Completed)   CMP14+EGFR (Completed)   US Abdomen Complete   Lipase (Completed)   Lipid Panel (Completed)   Non-intractable vomiting with nausea, unspecified vomiting type       Relevant Medications   ondansetron (ZOFRAN) 4 MG tablet   Other Relevant Orders   CBC with Differential (Completed)   CMP14+EGFR (Completed)   US Abdomen Complete   Gastroesophageal reflux disease, esophagitis presence not specified       Relevant Medications   ondansetron (ZOFRAN) 4 MG tablet   esomeprazole (NEXIUM) 20 MG capsule   Essential hypertension       Relevant Medications   amLODipine (NORVASC) 10 MG tablet   Other Relevant Orders   Microalbumin/Creatinine Ratio, Urine (Completed)   Constipation, unspecified constipation type       Relevant Medications   senna-docusate (SENNA S) 8.6-50 MG tablet      Meds ordered this encounter  Medications  . ondansetron (ZOFRAN) 4 MG tablet    Sig: Take 1 tablet (4 mg total) by mouth every 8 (eight) hours as needed for nausea or vomiting.    Dispense:  20 tablet    Refill:  0  . esomeprazole (NEXIUM) 20 MG capsule    Sig: Take 1 capsule (20 mg total) by mouth daily.    Dispense:  30 capsule    Refill:  1  . amLODipine (NORVASC) 10 MG tablet    Sig: Take 1 tablet (10 mg total) by mouth daily.    Dispense:  30 tablet    Refill:  2  . senna-docusate (SENNA S) 8.6-50 MG tablet    Sig: Take 1-2 tablets by mouth at bedtime as needed for mild constipation or moderate constipation.    Follow-up: Return if symptoms worsen or fail to improve. Return  in about 3 months (around 05/01/2017),  for Hypertension.   Alfonse Spruce FNP

## 2017-01-29 NOTE — Patient Instructions (Addendum)
Nausea and Vomiting, Adult Feeling sick to your stomach (nausea) means that your stomach is upset or you feel like you have to throw up (vomit). Feeling more and more sick to your stomach can lead to throwing up. Throwing up happens when food and liquid from your stomach are thrown up and out the mouth. Throwing up can make you feel weak and cause you to get dehydrated. Dehydration can make you tired and thirsty, make you have a dry mouth, and make it so you pee (urinate) less often. Older adults and people with other diseases or a weak defense system (immune system) are at higher risk for dehydration. If you feel sick to your stomach or if you throw up, it is important to follow instructions from your doctor about how to take care of yourself. Follow these instructions at home: Eating and drinking  Follow these instructions as told by your doctor:  Take an oral rehydration solution (ORS). This is a drink that is sold at pharmacies and stores.  Drink clear fluids in small amounts as you are able, such as:  Water.  Ice chips.  Diluted fruit juice.  Low-calorie sports drinks.  Eat bland, easy-to-digest foods in small amounts as you are able, such as:  Bananas.  Applesauce.  Rice.  Low-fat (lean) meats.  Toast.  Crackers.  Avoid fluids that have a lot of sugar or caffeine in them.  Avoid alcohol.  Avoid spicy or fatty foods. General instructions   Drink enough fluid to keep your pee (urine) clear or pale yellow.  Wash your hands often. If you cannot use soap and water, use hand sanitizer.  Make sure that all people in your home wash their hands well and often.  Take over-the-counter and prescription medicines only as told by your doctor.  Rest at home while you get better.  Watch your condition for any changes.  Breathe slowly and deeply when you feel sick to your stomach.  Keep all follow-up visits as told by your doctor. This is important. Contact a doctor  if:  You have a fever.  You cannot keep fluids down.  Your symptoms get worse.  You have new symptoms.  You feel sick to your stomach for more than two days.  You feel light-headed or dizzy.  You have a headache.  You have muscle cramps. Get help right away if:  You have pain in your chest, neck, arm, or jaw.  You feel very weak or you pass out (faint).  You throw up again and again.  You see blood in your throw-up.  Your throw-up looks like black coffee grounds.  You have bloody or black poop (stools) or poop that look like tar.  You have a very bad headache, a stiff neck, or both.  You have a rash.  You have very bad pain, cramping, or bloating in your belly (abdomen).  You have trouble breathing.  You are breathing very quickly.  Your heart is beating very quickly.  Your skin feels cold and clammy.  You feel confused.  You have pain when you pee.  You have signs of dehydration, such as:  Dark pee, hardly any pee, or no pee.  Cracked lips.  Dry mouth.  Sunken eyes.  Sleepiness.  Weakness. These symptoms may be an emergency. Do not wait to see if the symptoms will go away. Get medical help right away. Call your local emergency services (911 in the [place] Do not drive yourself to the hospital. This information   is not intended to replace advice given to you by your health care provider. Make sure you discuss any questions you have with your health care provider. Document Released: 04/09/2008 Document Revised: 05/11/2016 Document Reviewed: 06/28/2015 Elsevier Interactive Patient Education  2017 [employer]  Gastroesophageal Reflux Scan A gastroesophageal reflux scan is a procedure that is used to check for gastroesophageal reflux, which is the backward flow of stomach contents into the tube that carries food from the mouth to the stomach (esophagus). The scan can also show if any stomach contents are inhaled (aspirated) into your lungs. You may  need this scan if you have symptoms such as heartburn, vomiting, swallowing problems, or regurgitation. Regurgitation means that swallowed food is returning from the stomach to the esophagus. For this scan, you will drink a liquid that contains a small amount of a radioactive substance (tracer). A scanner with a camera that detects the radioactive tracer is used to see if any of the material backs up into your esophagus. Tell a health care provider about:  Any allergies you have.  All medicines you are taking, including vitamins, herbs, eye drops, creams, and over-the-counter medicines.  Any blood disorders you have.  Any surgeries you have had.  Any medical conditions you have.  If you are pregnant or you think that you may be pregnant.  If you are breastfeeding. What are the risks? Generally, this is a safe procedure. However, problems may occur, including:  Exposure to radiation (a small amount).  Allergic reaction to the radioactive substance. This is rare. What happens before the procedure?  Ask your health care provider about changing or stopping your regular medicines. This is especially important if you are taking diabetes medicines or blood thinners.  Follow your health care provider's instructions about eating or drinking restrictions. What happens during the procedure?  You will be asked to drink a liquid that contains a small amount of a radioactive tracer. This liquid will probably be similar to orange juice.  You will assume a position lying on your back.  A series of images will be taken of your esophagus and upper stomach.  You may be asked to move into different positions to help determine if reflux occurs more often when you are in specific positions.  For adults, an abdominal binder with an inflatable cuff may be placed on the belly (abdomen). This may be used to increase abdominal pressure. More images will be taken to see if the increased pressure causes  reflux to occur. The procedure may vary among health care providers and hospitals. What happens after the procedure?  Return to your normal activities and your normal diet as directed by your health care provider.  The radioactive tracer will leave your body over the next few days. Drink enough fluid to keep your urine clear or pale yellow. This will help to flush the tracer out of your body.  It is your responsibility to obtain your test results. Ask your health care provider or the department performing the test when and how you will get your results. This information is not intended to replace advice given to you by your health care provider. Make sure you discuss any questions you have with your health care provider. Document Released: 12/13/2005 Document Revised: 07/16/2016 Document Reviewed: 08/03/2014 Elsevier Interactive Patient Education  2017 [employer] Inc.   Clear Liquid Diet A clear liquid diet means that you only have liquids that you can see through. You do not eat any food on this  diet. Most people need to follow this diet for only a short time. What do I need to know about this diet?  A clear liquid is a liquid that you can see through when you hold it up to a light.  This diet does not give you all the nutrients that you need. Choose a variety of the liquids that your doctor says you can drink on this diet. That way, you will get as many nutrients as possible.  If you are not sure whether you can have certain items, ask your doctor. What can I have?  Water and flavored water.  Fruit juices that do not have pulp, such as cranberry juice and apple juice.  Tea and coffee without milk or cream.  Clear bouillon or broth.  Broth-based soups that have been strained.  Flavored gelatins.  Honey.  Sugar water.  Frozen ice or frozen ice pops that do not have any milk, yogurt, fruit pieces, or fruit pulp in them.  Clear sodas.  Clear sports drinks. The items listed  above may not be a complete list of recommended liquids. Contact your food and nutrition expert (dietitian) for more options.  What can I not have?  Juices that have pulp.  Milk.  Cream or cream-based soups.  Yogurt. The items listed above may not be a complete list of liquids to avoid. Contact your food and nutrition expert for more information.  Summary  A clear liquid diet is a diet that includes only liquids that you can see through.  The goal of this diet is to help you recover.  Make sure to avoid liquids with milk, cream, or pulp while you are on this diet. This information is not intended to replace advice given to you by your health care provider. Make sure you discuss any questions you have with your health care provider. Document Released: 10/04/2008 Document Revised: 06/04/2016 Document Reviewed: 09/18/2013 Elsevier Interactive Patient Education  2017 [employer]

## 2017-01-30 LAB — CBC WITH DIFFERENTIAL/PLATELET
Basophils Absolute: 0.1 10*3/uL (ref 0.0–0.2)
Basos: 1 %
EOS (ABSOLUTE): 0.1 10*3/uL (ref 0.0–0.4)
EOS: 2 %
HEMOGLOBIN: 14.6 g/dL (ref 11.1–15.9)
Hematocrit: 44.1 % (ref 34.0–46.6)
Immature Grans (Abs): 0 10*3/uL (ref 0.0–0.1)
Immature Granulocytes: 0 %
LYMPHS ABS: 2.1 10*3/uL (ref 0.7–3.1)
Lymphs: 48 %
MCH: 29.6 pg (ref 26.6–33.0)
MCHC: 33.1 g/dL (ref 31.5–35.7)
MCV: 90 fL (ref 79–97)
MONOCYTES: 8 %
MONOS ABS: 0.4 10*3/uL (ref 0.1–0.9)
NEUTROS ABS: 1.8 10*3/uL (ref 1.4–7.0)
Neutrophils: 41 %
Platelets: 290 10*3/uL (ref 150–379)
RBC: 4.93 x10E6/uL (ref 3.77–5.28)
RDW: 13.6 % (ref 12.3–15.4)
WBC: 4.3 10*3/uL (ref 3.4–10.8)

## 2017-01-30 LAB — CMP14+EGFR
ALT: 17 IU/L (ref 0–32)
AST: 17 IU/L (ref 0–40)
Albumin/Globulin Ratio: 1.4 (ref 1.2–2.2)
Albumin: 4.6 g/dL (ref 3.5–5.5)
Alkaline Phosphatase: 64 IU/L (ref 39–117)
BUN/Creatinine Ratio: 14 (ref 9–23)
BUN: 10 mg/dL (ref 6–24)
Bilirubin Total: 0.3 mg/dL (ref 0.0–1.2)
CALCIUM: 9.8 mg/dL (ref 8.7–10.2)
CHLORIDE: 92 mmol/L — AB (ref 96–106)
CO2: 28 mmol/L (ref 18–29)
CREATININE: 0.69 mg/dL (ref 0.57–1.00)
GFR calc Af Amer: 116 mL/min/{1.73_m2} (ref >59)
GFR, EST NON AFRICAN AMERICAN: 100 mL/min/{1.73_m2} (ref >59)
Globulin, Total: 3.2 g/dL (ref 1.5–4.5)
Glucose: 89 mg/dL (ref 65–99)
Potassium: 3.6 mmol/L (ref 3.5–5.2)
Sodium: 140 mmol/L (ref 134–144)
Total Protein: 7.8 g/dL (ref 6.0–8.5)

## 2017-01-30 LAB — MICROALBUMIN / CREATININE URINE RATIO
CREATININE, UR: 230.4 mg/dL
Microalb/Creat Ratio: 20.7 mg/g creat (ref 0.0–30.0)
Microalbumin, Urine: 47.7 ug/mL

## 2017-01-30 LAB — LIPID PANEL
Chol/HDL Ratio: 4.8 ratio units — ABNORMAL HIGH (ref 0.0–4.4)
Cholesterol, Total: 283 mg/dL — ABNORMAL HIGH (ref 100–199)
HDL: 59 mg/dL (ref >39)
LDL CALC: 200 mg/dL — AB (ref 0–99)
TRIGLYCERIDES: 120 mg/dL (ref 0–149)
VLDL Cholesterol Cal: 24 mg/dL (ref 5–40)

## 2017-01-30 LAB — LIPASE: Lipase: 27 U/L (ref 14–72)

## 2017-02-05 ENCOUNTER — Telehealth: Payer: Self-pay | Admitting: Family Medicine

## 2017-02-05 ENCOUNTER — Other Ambulatory Visit: Payer: Self-pay | Admitting: Family Medicine

## 2017-02-05 DIAGNOSIS — R945 Abnormal results of liver function studies: Principal | ICD-10-CM

## 2017-02-05 DIAGNOSIS — R7989 Other specified abnormal findings of blood chemistry: Secondary | ICD-10-CM

## 2017-02-05 MED FILL — NAPROXEN 500 MG TABLET: 500 | 30 days supply | Qty: 60 | Fill #0

## 2017-02-05 NOTE — Telephone Encounter (Signed)
CMA call to inform ultrasound appt   Patient was aware and understood

## 2017-02-05 NOTE — Telephone Encounter (Signed)
CMA call to go over lab results  Patient did not answer but CMA left a detailed message to call me back if have any questions about results & also would like to know a specific day and time to scheduled her ultrasound appt

## 2017-02-05 NOTE — Telephone Encounter (Signed)
-----   Message from [name], [place] sent at 02/05/2017  3:38 PM EDT ----- Microalbumin/creatinine ratio level was normal. This tests for protein in your urine that can indicate early signs of kidney damage.  Labs that evaluate blood cells, fluid, and electrolyte status were normal. Liver function normal Kidney function normal Lipase is normal. Elevated lipase could indicate pancreatitis. Lipid levels were elevated. This can increase your risk of heart disease overtime. Start eating a diet low in saturated fat. Limit your intake of fried foods, red meats, and whole milk. Increase activity. Recommend recheck in 6 months.

## 2017-02-05 NOTE — Telephone Encounter (Signed)
CMA call patient to get specific date & time to scheduled her ultrasound  Patient was not available but left a message with a lady to tell her to call me back

## 2017-02-05 NOTE — Telephone Encounter (Signed)
Patient called the office asking to speak with nurse regarding setting up her appt for ultrasound. Pt has [facility] financial assistance. Please follow up.  Thank you.

## 2017-02-13 ENCOUNTER — Ambulatory Visit (HOSPITAL_COMMUNITY)
Admission: RE | Admit: 2017-02-13 | Discharge: 2017-02-13 | Disposition: A | Discharge status: Home / Self Care | Payer: Medicare Other | Source: Ambulatory Visit | Attending: Family Medicine | Admitting: Family Medicine

## 2017-02-13 DIAGNOSIS — K8020 Calculus of gallbladder without cholecystitis without obstruction: Secondary | ICD-10-CM | POA: Insufficient documentation

## 2017-02-13 DIAGNOSIS — R112 Nausea with vomiting, unspecified: Secondary | ICD-10-CM | POA: Diagnosis not present

## 2017-02-13 DIAGNOSIS — R1030 Lower abdominal pain, unspecified: Secondary | ICD-10-CM | POA: Diagnosis not present

## 2017-02-13 DIAGNOSIS — N200 Calculus of kidney: Secondary | ICD-10-CM | POA: Insufficient documentation

## 2017-02-19 ENCOUNTER — Other Ambulatory Visit: Payer: Self-pay | Admitting: Family Medicine

## 2017-02-19 DIAGNOSIS — K8010 Calculus of gallbladder with chronic cholecystitis without obstruction: Secondary | ICD-10-CM

## 2017-02-19 NOTE — Telephone Encounter (Signed)
-----   Message from [name], FNP sent at 02/19/2017  1:05 PM EDT ----- Gallbladder showed gallstone. You will be referred to gastroenterology.  Left kidney showed kidney stone of 1.3 cm. Will continue to monitor. If you begin to develop any difficulty urinating, pink tinged or bloody urine, flank pain with or without N/V. Follow up with office.  Liver is normal Pancreas is normal Spleen is normal

## 2017-02-19 NOTE — Telephone Encounter (Signed)
CMA call patient to go over lab results  Patient Verify DOB  Patient was aware and understood  Patient stated that she been feeling Nauseas but nothing coming I recommend to make an office visit to be evaluated I stated that I could transfer to scheduler to make an appt Patient stated that she would call back & make the appt

## 2017-02-21 ENCOUNTER — Other Ambulatory Visit: Payer: Self-pay | Admitting: Family Medicine

## 2017-02-21 DIAGNOSIS — I10 Essential (primary) hypertension: Secondary | ICD-10-CM

## 2017-02-21 MED FILL — CHLORTHALIDONE 25 MG TABLET: 25 | 30 days supply | Qty: 30 | Fill #0

## 2017-03-01 ENCOUNTER — Other Ambulatory Visit: Payer: Self-pay | Admitting: Family Medicine

## 2017-03-01 DIAGNOSIS — R112 Nausea with vomiting, unspecified: Secondary | ICD-10-CM

## 2017-03-04 MED FILL — ?ONDANSETRON HCL 4 MG TABLE: 4 | 6 days supply | Qty: 20 | Fill #0

## 2017-03-13 ENCOUNTER — Other Ambulatory Visit: Payer: Self-pay | Admitting: Family Medicine

## 2017-03-13 DIAGNOSIS — I10 Essential (primary) hypertension: Secondary | ICD-10-CM

## 2017-03-14 DIAGNOSIS — M7551 Bursitis of right shoulder: Secondary | ICD-10-CM | POA: Diagnosis not present

## 2017-03-14 DIAGNOSIS — G894 Chronic pain syndrome: Secondary | ICD-10-CM | POA: Diagnosis not present

## 2017-03-14 DIAGNOSIS — M47812 Spondylosis without myelopathy or radiculopathy, cervical region: Secondary | ICD-10-CM | POA: Diagnosis not present

## 2017-03-14 DIAGNOSIS — G90511 Complex regional pain syndrome I of right upper limb: Secondary | ICD-10-CM | POA: Diagnosis not present

## 2017-03-14 MED FILL — AMLODIPINE BESYLATE 10 MG T: 10 | 30 days supply | Qty: 30 | Fill #0

## 2017-03-20 ENCOUNTER — Ambulatory Visit: Payer: Medicare Other | Attending: Family Medicine | Admitting: Family Medicine

## 2017-03-20 ENCOUNTER — Encounter: Payer: Self-pay | Admitting: Family Medicine

## 2017-03-20 VITALS — BP 132/88 | HR 78 | Temp 98.3°F | Resp 16 | Wt 147.8 lb

## 2017-03-20 DIAGNOSIS — N3001 Acute cystitis with hematuria: Secondary | ICD-10-CM

## 2017-03-20 DIAGNOSIS — K8020 Calculus of gallbladder without cholecystitis without obstruction: Secondary | ICD-10-CM | POA: Insufficient documentation

## 2017-03-20 DIAGNOSIS — Z79891 Long term (current) use of opiate analgesic: Secondary | ICD-10-CM | POA: Diagnosis not present

## 2017-03-20 DIAGNOSIS — K219 Gastro-esophageal reflux disease without esophagitis: Secondary | ICD-10-CM

## 2017-03-20 DIAGNOSIS — R112 Nausea with vomiting, unspecified: Secondary | ICD-10-CM

## 2017-03-20 DIAGNOSIS — Z79899 Other long term (current) drug therapy: Secondary | ICD-10-CM | POA: Diagnosis not present

## 2017-03-20 DIAGNOSIS — R1031 Right lower quadrant pain: Secondary | ICD-10-CM | POA: Diagnosis not present

## 2017-03-20 DIAGNOSIS — Z8719 Personal history of other diseases of the digestive system: Secondary | ICD-10-CM | POA: Diagnosis not present

## 2017-03-20 LAB — POCT URINALYSIS DIPSTICK
Glucose, UA: NEGATIVE
KETONES UA: NEGATIVE
NITRITE UA: NEGATIVE
PH UA: 6 (ref 5.0–8.0)
Protein, UA: 30
Spec Grav, UA: 1.025 (ref 1.010–1.025)
Urobilinogen, UA: 1 E.U./dL

## 2017-03-20 MED ORDER — CIPROFLOXACIN HCL 250 MG PO TABS: 250.0000 mg | ORAL_TABLET | Freq: Two times a day (BID) | ORAL | 0 refills | Status: DC

## 2017-03-20 MED ORDER — OMEPRAZOLE 20 MG PO CPDR: 20.0000 mg | DELAYED_RELEASE_CAPSULE | Freq: Every day | ORAL | 2 refills | Status: DC

## 2017-03-20 MED ORDER — ONDANSETRON HCL 4 MG PO TABS: 4.0000 mg | ORAL_TABLET | Freq: Three times a day (TID) | ORAL | 0 refills | Status: DC | PRN

## 2017-03-20 MED FILL — OMEPRAZOLE DR 20 MG CAPSULE: 20 | 30 days supply | Qty: 30 | Fill #0

## 2017-03-20 MED FILL — CIPROFLOXACIN HCL 250 MG TA: 250 | 5 days supply | Qty: 10 | Fill #0

## 2017-03-20 MED FILL — ONDANSETRON HCL 4 MG TABLET: 4 | 6 days supply | Qty: 20 | Fill #0

## 2017-03-20 NOTE — Patient Instructions (Addendum)
Low-Fat Diet for Pancreatitis or Gallbladder Conditions A low-fat diet can be helpful if you have pancreatitis or a gallbladder condition. With these conditions, your pancreas and gallbladder have trouble digesting fats. A healthy eating plan with less fat will help rest your pancreas and gallbladder and reduce your symptoms. What do I need to know about this diet?  Eat a low-fat diet.  Reduce your fat intake to less than 20-30% of your total daily calories. This is less than 50-60 g of fat per day.  Remember that you need some fat in your diet. Ask your dietician what your daily goal should be.  Choose nonfat and low-fat healthy foods. Look for the words "nonfat," "low fat," or "fat free."  As a guide, look on the label and choose foods with less than 3 g of fat per serving. Eat only one serving.  Avoid alcohol.  Do not smoke. If you need help quitting, talk with your health care provider.  Eat small frequent meals instead of three large heavy meals. What foods can I eat? Grains  Include healthy grains and starches such as potatoes, wheat bread, fiber-rich cereal, and brown rice. Choose whole grain options whenever possible. In adults, whole grains should account for 45-65% of your daily calories. Fruits and Vegetables  Eat plenty of fruits and vegetables. Fresh fruits and vegetables add fiber to your diet. Meats and Other Protein Sources  Eat lean meat such as chicken and pork. Trim any fat off of meat before cooking it. Eggs, fish, and beans are other sources of protein. In adults, these foods should account for 10-35% of your daily calories. Dairy  Choose low-fat milk and dairy options. Dairy includes fat and protein, as well as calcium. Fats and Oils  Limit high-fat foods such as fried foods, sweets, baked goods, sugary drinks. Other  Creamy sauces and condiments, such as mayonnaise, can add extra fat. Think about whether or not you need to use them, or use smaller amounts or low  fat options. What foods are not recommended?  High fat foods, such as:  [employer].  Ice cream.  [place] toast.  Sweet rolls.  Pizza.  Cheese bread.  Foods covered with batter, butter, creamy sauces, or cheese.  Fried foods.  Sugary drinks and desserts.  Foods that cause gas or bloating This information is not intended to replace advice given to you by your health care provider. Make sure you discuss any questions you have with your health care provider. Document Released: 10/27/2013 Document Revised: 03/29/2016 Document Reviewed: 10/05/2013 Elsevier Interactive Patient Education  2017 Elsevier Inc.   Abdominal Pain, Adult Many things can cause belly (abdominal) pain. Most times, belly pain is not dangerous. Many cases of belly pain can be watched and treated at home. Sometimes belly pain is serious, though. Your doctor will try to find the cause of your belly pain. Follow these instructions at home:  Take over-the-counter and prescription medicines only as told by your doctor. Do not take medicines that help you poop (laxatives) unless told to by your doctor.  Drink enough fluid to keep your pee (urine) clear or pale yellow.  Watch your belly pain for any changes.  Keep all follow-up visits as told by your doctor. This is important. Contact a doctor if:  Your belly pain changes or gets worse.  You are not hungry, or you lose weight without trying.  You are having trouble pooping (constipated) or have watery poop (diarrhea) for more than 2-3 days.  You have pain when you pee or poop.  Your belly pain wakes you up at night.  Your pain gets worse with meals, after eating, or with certain foods.  You are throwing up and cannot keep anything down.  You have a fever. Get help right away if:  Your pain does not go away as soon as your doctor says it should.  You cannot stop throwing up.  Your pain is only in areas of your belly, such as the right side or the  left lower part of the belly.  You have bloody or black poop, or poop that looks like tar.  You have very bad pain, cramping, or bloating in your belly.  You have signs of not having enough fluid or water in your body (dehydration), such as:  Dark pee, very little pee, or no pee.  Cracked lips.  Dry mouth.  Sunken eyes.  Sleepiness.  Weakness. This information is not intended to replace advice given to you by your health care provider. Make sure you discuss any questions you have with your health care provider. Document Released: 04/09/2008 Document Revised: 05/11/2016 Document Reviewed: 04/04/2016 Elsevier Interactive Patient Education  2017 Elsevier Inc.  Acute Urinary Retention, Female Urinary retention means you are unable to pee completely or at all (empty your bladder). Follow these instructions at home:  Drink enough fluids to keep your pee (urine) clear or pale yellow.  If you are sent home with a tube that drains the bladder (catheter), there will be a drainage bag attached to it. There are two types of bags. One is big that you can wear at night without having to empty it. One is smaller and needs to be emptied more often.  Keep the drainage bag emptied.  Keep the drainage bag lower than the tube.  Only take medicine as told by your doctor. Contact a doctor if:  You have a low-grade fever.  You have spasms or you are leaking pee when you have spasms. Get help right away if:  You have chills or a fever.  Your catheter stops draining pee.  Your catheter falls out.  You have increased bleeding that does not stop after you have rested and increased the amount of fluids you had been drinking. This information is not intended to replace advice given to you by your health care provider. Make sure you discuss any questions you have with your health care provider. Document Released: 04/09/2008 Document Revised: 03/29/2016 Document Reviewed: 04/02/2013 Elsevier  Interactive Patient Education  2017 Elsevier Inc. Ciprofloxacin tablets What is this medicine? CIPROFLOXACIN (sip roe FLOX a sin) is a quinolone antibiotic. It is used to treat certain kinds of bacterial infections. It will not work for colds, flu, or other viral infections. This medicine may be used for other purposes; ask your health care provider or pharmacist if you have questions. COMMON BRAND NAME(S): Cipro What should I tell my health care provider before I take this medicine? They need to know if you have any of these conditions: -bone problems -history of low levels of potassium in the blood -joint problems -irregular heartbeat -kidney disease -myasthenia gravis -seizures -tendon problems -tingling of the fingers or toes, or other nerve disorder -an unusual or allergic reaction to ciprofloxacin, other antibiotics or medicines, foods, dyes, or preservatives -pregnant or trying to get pregnant -breast-feeding How should I use this medicine? Take this medicine by mouth with a glass of water. Follow the directions on the prescription label. Take your medicine  at regular intervals. Do not take your medicine more often than directed. Take all of your medicine as directed even if you think your are better. Do not skip doses or stop your medicine early. You can take this medicine with food or on an empty stomach. It can be taken with a meal that contains dairy or calcium, but do not take it alone with a dairy product, like milk or yogurt or calcium-fortified juice. A special MedGuide will be given to you by the pharmacist with each prescription and refill. Be sure to read this information carefully each time. Talk to your pediatrician regarding the use of this medicine in children. Special care may be needed. Overdosage: If you think you have taken too much of this medicine contact a poison control center or emergency room at once. NOTE: This medicine is only for you. Do not share this  medicine with others. What if I miss a dose? If you miss a dose, take it as soon as you can. If it is almost time for your next dose, take only that dose. Do not take double or extra doses. What may interact with this medicine? Do not take this medicine with any of the following medications: -cisapride -dofetilide -dronedarone -flibanserin -lomitapide -pimozide -thioridazine -tizanidine -ziprasidone This medicine may also interact with the following medications: -antacids -birth control pills -caffeine -certain medicines for diabetes, like glipizide or glyburide -certain medicines that treat or prevent blood clots like warfarin -clozapine -cyclosporine -didanosine (ddI) buffered tablets or powder -duloxetine -lanthanum carbonate -lidocaine -methotrexate -multivitamins -NSAIDS, medicines for pain and inflammation, like ibuprofen or naproxen -olanzapine -omeprazole -other medicines that prolong the QT interval (cause an abnormal heart rhythm) -phenytoin -probenecid -ropinirole -sevelamer -sildenafil -sucralfate -theophylline -zolpidem This list may not describe all possible interactions. Give your health care provider a list of all the medicines, herbs, non-prescription drugs, or dietary supplements you use. Also tell them if you smoke, drink alcohol, or use illegal drugs. Some items may interact with your medicine. What should I watch for while using this medicine? Tell your doctor or health care professional if your symptoms do not improve. Do not treat diarrhea with over the counter products. Contact your doctor if you have diarrhea that lasts more than 2 days or if it is severe and watery. You may get drowsy or dizzy. Do not drive, use machinery, or do anything that needs mental alertness until you know how this medicine affects you. Do not stand or sit up quickly, especially if you are an older patient. This reduces the risk of dizzy or fainting spells. This medicine  can make you more sensitive to the sun. Keep out of the sun. If you cannot avoid being in the sun, wear protective clothing and use sunscreen. Do not use sun lamps or tanning beds/booths. Avoid antacids, aluminum, calcium, iron, magnesium, and zinc products for 6 hours before and 2 hours after taking a dose of this medicine. What side effects may I notice from receiving this medicine? Side effects that you should report to your doctor or health care professional as soon as possible: -allergic reactions like skin rash or hives, swelling of the face, lips, or tongue -anxious -confusion -depressed mood -diarrhea -fast, irregular heartbeat -hallucination, loss of contact with reality -joint, muscle, or tendon pain or swelling -pain, tingling, numbness in the hands or feet -suicidal thoughts or other mood changes -sunburn -unusually weak or tired Side effects that usually do not require medical attention (report to your doctor or health  care professional if they continue or are bothersome): -dry mouth -headache -nausea -trouble sleeping This list may not describe all possible side effects. Call your doctor for medical advice about side effects. You may report side effects to FDA at 1-800-FDA-1088. Where should I keep my medicine? Keep out of the reach of children. Store at room temperature below 30 degrees C (86 degrees F). Keep container tightly closed. Throw away any unused medicine after the expiration date. NOTE: This sheet is a summary. It may not cover all possible information. If you have questions about this medicine, talk to your doctor, pharmacist, or health care provider.  2018 Elsevier/Gold Standard (2016-06-01 14:42:02)

## 2017-03-20 NOTE — Progress Notes (Signed)
Subjective:  Patient ID: [name], female    DOB: 04-11-1966  Age: 51 y.o. MRN: [id number]  CC: Cholelithiasis   HPI [name] presents for    Abdominal pain:Patient complains of abdominal pain. The pain is described as aching, and is moderate in  intensity. Pain is located in the RLQ without radiation. Onset was several days ago. Symptoms have been unchanged since. Aggravating factors: eating. Report symptoms worsen with greasy foods and after eating  fatty meal. Reports heartburn..Alleviating factors: none. Associated symptoms: nausea, poor appetite, headaches, dizziness, and dry heaves. The patient denies chills, diarrhea, dysuria, fever, hematochezia, hematuria, melena, sweats and or visual disturbance.She reports being able to drink fluids. She reports being able to learittle eat foods. Reports drinking gingerale to help with symptoms. History of gallstones with referral placed at previous visit. She reports not following up with referral due to lack of medical coverage.    Outpatient Medications Prior to Visit  Medication Sig Dispense Refill  . ALPRAZolam (XANAX) 0.5 MG tablet Take 2 tablets approximately 45 minutes prior to the MRI study, take a third tablet if needed. (Patient not taking: Reported on 12/12/2016) 3 tablet 0  . amLODipine (NORVASC) 10 MG tablet Take 1 tablet (10 mg total) by mouth daily. 30 tablet 2  . benzonatate (TESSALON) 100 MG capsule Take 2 capsules (200 mg total) by mouth 2 (two) times daily as needed for cough. (Patient not taking: Reported on 12/12/2016) 20 capsule 0  . chlorthalidone (HYGROTON) 25 MG tablet TAKE 1 TABLET BY MOUTH DAILY. 30 tablet 2  . docusate sodium (COLACE) 100 MG capsule Take 1 capsule (100 mg total) by mouth 2 (two) times daily. (Patient not taking: Reported on 12/12/2016) 60 capsule 3  . doxycycline (VIBRAMYCIN) 100 MG capsule Take 1 capsule (100 mg total) by mouth 2 (two) times daily. (Patient not taking: Reported on 12/12/2016) 20 capsule 0   . esomeprazole (NEXIUM) 20 MG capsule Take 1 capsule (20 mg total) by mouth daily. 30 capsule 1  . estradiol (ESTRACE) 0.5 MG tablet Take 0.5 mg by mouth daily.    Marland Kitchen loratadine (CLARITIN) 10 MG tablet Take 10 mg by mouth daily.    . meclizine (ANTIVERT) 25 MG tablet Take 1 tablet (25 mg total) by mouth 3 (three) times daily as needed for dizziness. (Patient not taking: Reported on 12/12/2016) 30 tablet 0  . medroxyPROGESTERone (PROVERA) 2.5 MG tablet Take 2.5 mg by mouth daily.    . naproxen (NAPROSYN) 500 MG tablet TAKE 1 TABLET BY MOUTH 2 TIMES DAILY WITH A MEAL. 60 tablet 0  . ondansetron (ZOFRAN ODT) 4 MG disintegrating tablet Take 1 tablet (4 mg total) by mouth every 8 (eight) hours as needed for nausea. 10 tablet 0  . oxyCODONE-acetaminophen (PERCOCET/ROXICET) 5-325 MG per tablet Take 2 tablets by mouth every 6 (six) hours as needed for severe pain. 15 tablet 0  . potassium chloride (MICRO-K) 10 MEQ CR capsule Take 1 capsule (10 mEq total) by mouth daily. 30 capsule 3  . senna-docusate (SENNA S) 8.6-50 MG tablet Take 1-2 tablets by mouth at bedtime as needed for mild constipation or moderate constipation.    . ondansetron (ZOFRAN) 4 MG tablet TAKE 1 TABLET BY MOUTH EVERY 8 HOURS AS NEEDED FOR NAUSEA OR VOMITING. 20 tablet 0   No facility-administered medications prior to visit.     ROS Review of Systems  Constitutional: Negative.   HENT: Negative.   Respiratory: Negative.   Cardiovascular: Negative.  Gastrointestinal: Positive for abdominal pain and nausea.  Skin: Negative.   Neurological: Positive for headaches.  Psychiatric/Behavioral: Negative.     Objective:  BP 132/88   Pulse 78   Temp 98.3 F (36.8 C) (Oral)   Resp 16   Wt 147 lb 12.8 oz (67 kg)   SpO2 95%   BMI 25.37 kg/m   BP/Weight 03/20/2017 12/28/3610 12/09/4973  Systolic BP 300 511 021  Diastolic BP 88 86 77  Wt. (Lbs) 147.8 150.4 157  BMI 25.37 25.82 26.95     Physical Exam  Constitutional: She appears  well-developed and well-nourished.  HENT:  Head: Normocephalic and atraumatic.  Right Ear: External ear normal.  Left Ear: External ear normal.  Nose: Nose normal.  Eyes: Conjunctivae are normal. Pupils are equal, round, and reactive to light.  Cardiovascular: Normal rate, regular rhythm, normal heart sounds and intact distal pulses.   Pulmonary/Chest: Effort normal and breath sounds normal.  Abdominal: Soft. Bowel sounds are normal. There is tenderness (RLQ).  Skin: Skin is warm and dry.  Psychiatric: She has a normal mood and affect.  Nursing note and vitals reviewed.   Assessment & Plan:   Problem List Items Addressed This Visit    None    Visit Diagnoses    Acute cystitis with hematuria    -  Primary   Relevant Medications   ciprofloxacin (CIPRO) 250 MG tablet   RLQ abdominal pain       Relevant Orders   POCT urinalysis dipstick (Completed)   CBC with Differential (Completed)   CMP14+EGFR (Completed)   History of gallstones         History of gallstones with referral placed at previous visit.    Relevant Orders   Ambulatory referral to Gastroenterology   Non-intractable vomiting with nausea, unspecified vomiting type       Relevant Medications   ondansetron (ZOFRAN) 4 MG tablet   Gastroesophageal reflux disease without esophagitis       Relevant Medications   ondansetron (ZOFRAN) 4 MG tablet   omeprazole (PRILOSEC) 20 MG capsule      Meds ordered this encounter  Medications  . ondansetron (ZOFRAN) 4 MG tablet    Sig: Take 1 tablet (4 mg total) by mouth every 8 (eight) hours as needed for nausea or vomiting.    Dispense:  20 tablet    Refill:  0    Order Specific Question:   Supervising Provider    Answer:   Tresa Garter [id number]  . omeprazole (PRILOSEC) 20 MG capsule    Sig: Take 1 capsule (20 mg total) by mouth daily.    Dispense:  30 capsule    Refill:  2    Order Specific Question:   Supervising Provider    Answer:   Tresa Garter  [id number]  . ciprofloxacin (CIPRO) 250 MG tablet    Sig: Take 1 tablet (250 mg total) by mouth 2 (two) times daily.    Dispense:  10 tablet    Refill:  0    Order Specific Question:   Supervising Provider    Answer:   Tresa Garter [id number]    Follow-up: Return if symptoms worsen or fail to improve.   Alfonse Spruce FNP

## 2017-03-21 LAB — CBC WITH DIFFERENTIAL/PLATELET
BASOS ABS: 0.1 10*3/uL (ref 0.0–0.2)
Basos: 1 %
EOS (ABSOLUTE): 0.1 10*3/uL (ref 0.0–0.4)
EOS: 1 %
HEMATOCRIT: 45.4 % (ref 34.0–46.6)
HEMOGLOBIN: 14.9 g/dL (ref 11.1–15.9)
IMMATURE GRANS (ABS): 0 10*3/uL (ref 0.0–0.1)
IMMATURE GRANULOCYTES: 0 %
LYMPHS ABS: 3 10*3/uL (ref 0.7–3.1)
LYMPHS: 48 %
MCH: 30.3 pg (ref 26.6–33.0)
MCHC: 32.8 g/dL (ref 31.5–35.7)
MCV: 93 fL (ref 79–97)
MONOCYTES: 7 %
Monocytes Absolute: 0.4 10*3/uL (ref 0.1–0.9)
NEUTROS PCT: 43 %
Neutrophils Absolute: 2.7 10*3/uL (ref 1.4–7.0)
Platelets: 310 10*3/uL (ref 150–379)
RBC: 4.91 x10E6/uL (ref 3.77–5.28)
RDW: 13.6 % (ref 12.3–15.4)
WBC: 6.3 10*3/uL (ref 3.4–10.8)

## 2017-03-21 LAB — CMP14+EGFR
ALBUMIN: 4.7 g/dL (ref 3.5–5.5)
ALK PHOS: 62 IU/L (ref 39–117)
ALT: 8 IU/L (ref 0–32)
AST: 14 IU/L (ref 0–40)
Albumin/Globulin Ratio: 1.5 (ref 1.2–2.2)
BUN / CREAT RATIO: 13 (ref 9–23)
BUN: 11 mg/dL (ref 6–24)
Bilirubin Total: 0.3 mg/dL (ref 0.0–1.2)
CALCIUM: 10.4 mg/dL — AB (ref 8.7–10.2)
CO2: 31 mmol/L — AB (ref 18–29)
CREATININE: 0.85 mg/dL (ref 0.57–1.00)
Chloride: 92 mmol/L — ABNORMAL LOW (ref 96–106)
GFR calc Af Amer: 91 mL/min/{1.73_m2} (ref >59)
GFR, EST NON AFRICAN AMERICAN: 79 mL/min/{1.73_m2} (ref >59)
GLOBULIN, TOTAL: 3.2 g/dL (ref 1.5–4.5)
GLUCOSE: 103 mg/dL — AB (ref 65–99)
Potassium: 3.2 mmol/L — ABNORMAL LOW (ref 3.5–5.2)
SODIUM: 139 mmol/L (ref 134–144)
Total Protein: 7.9 g/dL (ref 6.0–8.5)

## 2017-03-26 MED FILL — CHLORTHALIDONE 25 MG TABLET: 25 | 30 days supply | Qty: 30 | Fill #1

## 2017-03-27 ENCOUNTER — Other Ambulatory Visit: Payer: Self-pay | Admitting: Family Medicine

## 2017-03-27 ENCOUNTER — Telehealth: Payer: Self-pay

## 2017-03-27 DIAGNOSIS — E876 Hypokalemia: Secondary | ICD-10-CM

## 2017-03-27 MED ORDER — POTASSIUM CHLORIDE ER 10 MEQ PO CPCR: 10.0000 meq | ORAL_CAPSULE | Freq: Every day | ORAL | 3 refills | Status: DC

## 2017-03-27 MED FILL — POTASSIUM CL 10 MEQ TAB SA: 10 | 30 days supply | Qty: 30 | Fill #0

## 2017-03-27 NOTE — Telephone Encounter (Signed)
-----   Message from Lizbeth BarkMandesia R Hairston, FNP sent at 03/27/2017  3:29 PM EDT ----- Potassium level was low. You will be prescribed a potassium supplement to treat.  Calcium level is elevated. Recommend checking vitamin d and parathyroid hormone (PTH) to evaluate for hyperparathyroidism. Hyperparathyroidism can cause symptoms of constipation, fatigue, poor appetite, joint pain, and increased risk of kidney stones.  Liver function normal Kidney function normal Labs that evaluated your blood cells was normal. No signs of anemia, infection, or inflammation present.

## 2017-03-27 NOTE — Telephone Encounter (Signed)
CMA call regarding lab results  Patient did not answer but left a detailed message  & stated that if have any questions just to call back at the office

## 2017-03-29 ENCOUNTER — Ambulatory Visit (HOSPITAL_COMMUNITY)
Admission: RE | Admit: 2017-03-29 | Discharge: 2017-03-29 | Disposition: A | Discharge status: Home / Self Care | Payer: Medicare Other | Source: Ambulatory Visit | Attending: Urology | Admitting: Urology

## 2017-03-29 ENCOUNTER — Other Ambulatory Visit: Payer: Self-pay | Admitting: Urology

## 2017-03-29 ENCOUNTER — Other Ambulatory Visit (HOSPITAL_COMMUNITY)
Admission: RE | Admit: 2017-03-29 | Discharge: 2017-03-29 | Disposition: A | Discharge status: Home / Self Care | Payer: Medicare Other | Attending: Urology | Admitting: Urology

## 2017-03-29 ENCOUNTER — Ambulatory Visit (INDEPENDENT_AMBULATORY_CARE_PROVIDER_SITE_OTHER): Payer: Medicare Other | Admitting: Urology

## 2017-03-29 DIAGNOSIS — N200 Calculus of kidney: Secondary | ICD-10-CM

## 2017-03-29 LAB — URINALYSIS, COMPLETE (UACMP) WITH MICROSCOPIC
Bilirubin Urine: NEGATIVE
Glucose, UA: NEGATIVE mg/dL
Hgb urine dipstick: NEGATIVE
KETONES UR: NEGATIVE mg/dL
Nitrite: NEGATIVE
PH: 8 (ref 5.0–8.0)
PROTEIN: 30 mg/dL — AB
Specific Gravity, Urine: 1.017 (ref 1.005–1.030)

## 2017-03-30 LAB — URINE CULTURE

## 2017-04-11 DIAGNOSIS — G90511 Complex regional pain syndrome I of right upper limb: Secondary | ICD-10-CM | POA: Diagnosis not present

## 2017-04-11 DIAGNOSIS — G4700 Insomnia, unspecified: Secondary | ICD-10-CM | POA: Diagnosis not present

## 2017-04-11 DIAGNOSIS — G894 Chronic pain syndrome: Secondary | ICD-10-CM | POA: Diagnosis not present

## 2017-04-11 DIAGNOSIS — G4733 Obstructive sleep apnea (adult) (pediatric): Secondary | ICD-10-CM | POA: Diagnosis not present

## 2017-04-11 DIAGNOSIS — M47812 Spondylosis without myelopathy or radiculopathy, cervical region: Secondary | ICD-10-CM | POA: Diagnosis not present

## 2017-04-11 DIAGNOSIS — Z79891 Long term (current) use of opiate analgesic: Secondary | ICD-10-CM | POA: Diagnosis not present

## 2017-04-11 DIAGNOSIS — M7551 Bursitis of right shoulder: Secondary | ICD-10-CM | POA: Diagnosis not present

## 2017-04-17 ENCOUNTER — Ambulatory Visit (INDEPENDENT_AMBULATORY_CARE_PROVIDER_SITE_OTHER): Payer: Medicare Other | Admitting: Internal Medicine

## 2017-04-17 ENCOUNTER — Encounter: Payer: Self-pay | Admitting: Internal Medicine

## 2017-04-17 VITALS — BP 110/76 | HR 80 | Ht 64.25 in | Wt 149.2 lb

## 2017-04-17 DIAGNOSIS — K219 Gastro-esophageal reflux disease without esophagitis: Secondary | ICD-10-CM

## 2017-04-17 DIAGNOSIS — K8020 Calculus of gallbladder without cholecystitis without obstruction: Secondary | ICD-10-CM | POA: Diagnosis not present

## 2017-04-17 DIAGNOSIS — G8929 Other chronic pain: Secondary | ICD-10-CM | POA: Diagnosis not present

## 2017-04-17 DIAGNOSIS — R1031 Right lower quadrant pain: Secondary | ICD-10-CM

## 2017-04-17 DIAGNOSIS — K5900 Constipation, unspecified: Secondary | ICD-10-CM | POA: Diagnosis not present

## 2017-04-17 MED ORDER — LINACLOTIDE 290 MCG PO CAPS: 290.0000 ug | ORAL_CAPSULE | Freq: Every day | ORAL | 3 refills | Status: DC

## 2017-04-17 NOTE — Progress Notes (Signed)
HISTORY OF PRESENT ILLNESS:  [name] is a 51 y.o. female who has been seen by GI previously for problems with abdominal pain, abnormal CT imaging, and iron deficiency anemia. Has undergone multiple endoscopic procedures. She has chronic constipation and is on chronic narcotics. She also has GERD for which she takes PPI. She sent today by her primary care provider regarding right lower quadrant pain and abnormal abdominal ultrasound showing gallstones. Patient has had chronic intermittent right lower quadrant pain associated with constipation. This is unchanged. She was evaluated by her primary care provider several months ago. Abdominal ultrasound 02/13/2017 revealed a gallstone within the gallbladder. No gallbladder wall thickening. Normal CBD. No other abnormalities. Patient complains today of constipation. She uses Senokot and milk of magnesia. Think she has tried MiraLAX in the past as well. She also has bloating. Some reflux despite PPI. Currently omeprazole 20 mg daily. Last office evaluation April 2016. See that dictation. Upper endoscopy and colonoscopy May 2016. Her endoscopy revealed an incidental distal esophageal ring but was otherwise normal. Complete colonoscopy including deep intubation of the terminal ileum was normal. Review of outside laboratories from May 2018 reveals normal liver function tests and normal CBC  REVIEW OF SYSTEMS:  All non-GI ROS negative except for sinus allergy, cough, muscle cramps, night sweats, sleeping problems, feet swelling  Past Medical History:  Diagnosis Date  . 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 Anemia   . Constipation   . GERD (gastroesophageal reflux disease)   . Hemorrhoids   . HTN (hypertension)   . Hyperlipidemia   . Seasonal allergies 08-13-13   tx. Claritin  . Vertigo     Past Surgical History:  Procedure Laterality Date  . ANAL FISTULECTOMY  08/22/2012   Procedure: FISTULECTOMY ANAL;  Surgeon: Lodema Pilot, DO;  Location: [facility];  Service:  General;  Laterality: N/A;  rectal examination under anesthesia with seton placement   . ANAL FISTULECTOMY  09/17/2012   Procedure: FISTULECTOMY ANAL;  Surgeon: Lodema Pilot, DO;  Location: [facility];  Service: General;  Laterality: N/A;  possible fistulotomy  . D & C HYSTEROSCOPY/ NOVASURE ENDOMETRIAL ABLATION/ I & D THROMBOSED HEMORROID  01-10-2007  DR Miguel Aschoff  . EXAMINATION UNDER ANESTHESIA  09/17/2012   Procedure: EXAM UNDER ANESTHESIA;  Surgeon: Lodema Pilot, DO;  Location: [facility];  Service: General;  Laterality: N/A;  Rectal exam under anesthesia  . EXAMINATION UNDER ANESTHESIA N/A 05/14/2013   Procedure: EXAM UNDER ANESTHESIA;  Surgeon: Romie Levee, MD;  Location: [facility];  Service: General;  Laterality: N/A;  . FISTULA PLUG  09/17/2012   Procedure: FISTULA PLUG;  Surgeon: Lodema Pilot, DO;  Location:  SURGERY CENTER;  Service: General;  Laterality: N/A;  Possible fistula plug  . HEMORRHOID SURGERY  04-28-2009   [facility] INTERNAL HEMORRHOIDS  . IRRIGATION AND DEBRIDEMENT ABSCESS  07/08/2012   Procedure: MINOR INCISION AND DRAINAGE OF ABSCESS;  Surgeon: Geryl Rankins, MD;  Location: [facility];  Service: Gynecology;  Laterality: N/A;  Vulvar Abscess  . PLACEMENT OF SETON N/A 05/14/2013   Procedure:  PLACEMENT OF SETON;  Surgeon: Romie Levee, MD;  Location: [facility];  Service: General;  Laterality: N/A;  . RECTAL EXAM UNDER ANESTHESIA N/A 08/20/2013   Procedure: mucosal advancement flap ;  Surgeon: Romie Levee, MD;  Location: [facility];  Service: General;  Laterality: N/A;  parks anal retractor long rectal instrucments prone jack knife anal fistula   . RECTAL ULTRASOUND  N/A 07/20/2013   Procedure: RECTAL ULTRASOUND;  Surgeon: Romie LeveeAlicia Thomas, MD;  Location: [facility];  Service: Endoscopy;  Laterality: N/A;    Social History Avie Arenasoie J Chiang  reports that she has never smoked. She has never used  smokeless tobacco. She reports that she drinks alcohol. She reports that she does not use drugs.  family history includes Diabetes in her brother, maternal aunt, maternal uncle, and mother; Heart attack in her father; Seizures in her sister.  Allergies  Allergen Reactions  . Ampicillin Anaphylaxis  . Penicillins Anaphylaxis  . Sulfonamide Derivatives Anaphylaxis  . Hydrocodone-Acetaminophen Hives    Have to take a Benadryl to take med  . Tramadol Itching  . Tylenol [Acetaminophen] Itching       PHYSICAL EXAMINATION: Vital signs: BP 110/76 (BP Location: Left Arm, Patient Position: Sitting, Cuff Size: Normal)   Pulse 80   Ht 5' 4.25" (1.632 m)   Wt 149 lb 4 oz (67.7 kg)   BMI 25.42 kg/m   Constitutional: generally well-appearing, no acute distress Psychiatric: alert and oriented x3, cooperative Eyes: extraocular movements intact, anicteric, conjunctiva pink Mouth: oral pharynx moist, no lesions Neck: supple no lymphadenopathy Cardiovascular: heart regular rate and rhythm, no murmur Lungs: clear to auscultation bilaterally Abdomen: soft, nontender, nondistended, no obvious ascites, no peritoneal signs, normal bowel sounds, no organomegaly Rectal:Omitted Extremities: no cyanosis or lower extremity edema bilaterally Skin: no lesions on visible extremities Neuro: No focal deficits. Cranial nerves intact  ASSESSMENT:  #1. Chronic constipation. #2. Abdominal bloating and right lower quadrant discomfort secondary to constipation #3. GERD. Some breakthrough symptoms on current dose of PPI #4. Cholelithiasis. Felt to be incidental at this point   PLAN:  #1. Prescribe linzess 290 g daily #2. Reflux precautions #3. May increase omeprazole to 40 mg daily #4. Discussed cholelithiasis. Discussed typical symptoms that may occur with symptomatic gallbladder disease. She understands #5. GI follow-up as needed. She has agreed contact the office for other recommendations regarding  constipation management if linzess is not helpful. I would consider higher dose MiraLAX regularly

## 2017-04-17 NOTE — Patient Instructions (Signed)
We have sent the following medications to your pharmacy for you to pick up at your convenience:  Linzess  Please follow up as needed

## 2017-04-19 ENCOUNTER — Other Ambulatory Visit: Payer: Self-pay | Admitting: Family Medicine

## 2017-04-19 DIAGNOSIS — I10 Essential (primary) hypertension: Secondary | ICD-10-CM

## 2017-04-19 MED FILL — ?OMEPRAZOLE DR 20 MG CAPSUL: 20 | 30 days supply | Qty: 30 | Fill #1

## 2017-04-19 MED FILL — ?CHLORTHALIDONE 25 MG TABLE: 25 | 30 days supply | Qty: 30 | Fill #2

## 2017-04-19 MED FILL — POTASSIUM CL 10 MEQ TAB SA: 10 | 30 days supply | Qty: 30 | Fill #1

## 2017-04-19 MED FILL — AMLODIPINE BESYLATE 10 MG T: 10 | 30 days supply | Qty: 30 | Fill #1

## 2017-04-22 ENCOUNTER — Telehealth: Payer: Self-pay | Admitting: Internal Medicine

## 2017-04-22 NOTE — Telephone Encounter (Signed)
error 

## 2017-04-22 NOTE — Telephone Encounter (Signed)
[facility] with [facility]ist [facility] who said patient was going to apply for assistance, which could take several weeks to be approved.  I offered to give the patient samples in the interim.  Called patient to address her concerns with cost of Linzess but she stated that she was [facility] and didn't need anything from me.  I instructed her to call me back if she had any other issues or concerns.  Patient agreed.

## 2017-04-23 MED FILL — !LINZESS 290 MCG CAPSULE: 290 | 30 days supply | Qty: 30 | Fill #0

## 2017-05-09 DIAGNOSIS — N760 Acute vaginitis: Secondary | ICD-10-CM | POA: Diagnosis not present

## 2017-05-20 DIAGNOSIS — J069 Acute upper respiratory infection, unspecified: Secondary | ICD-10-CM | POA: Diagnosis not present

## 2017-05-21 ENCOUNTER — Other Ambulatory Visit: Payer: Self-pay | Admitting: Family Medicine

## 2017-05-21 DIAGNOSIS — I10 Essential (primary) hypertension: Secondary | ICD-10-CM

## 2017-05-21 DIAGNOSIS — K219 Gastro-esophageal reflux disease without esophagitis: Secondary | ICD-10-CM

## 2017-05-21 MED FILL — !LINZESS 290 MCG CAPSULE: 290 | 30 days supply | Qty: 30 | Fill #1

## 2017-05-21 MED FILL — ?OMEPRAZOLE DR 20 MG CAPSUL: 20 | 30 days supply | Qty: 30 | Fill #2

## 2017-05-21 MED FILL — AMLODIPINE BESYLATE 10 MG T: 10 | 30 days supply | Qty: 30 | Fill #2

## 2017-05-21 MED FILL — POTASSIUM CL 10 MEQ TAB SA: 10 | 30 days supply | Qty: 30 | Fill #2

## 2017-05-22 MED FILL — ?ESOMEPRAZOLE MAG DR 20 MG: 20 | 30 days supply | Qty: 30 | Fill #1

## 2017-05-23 ENCOUNTER — Other Ambulatory Visit: Payer: Self-pay | Admitting: Family Medicine

## 2017-05-23 DIAGNOSIS — I10 Essential (primary) hypertension: Secondary | ICD-10-CM

## 2017-05-23 MED FILL — ?CHLORTHALIDONE 25 MG TABLE: 25 | 30 days supply | Qty: 30 | Fill #0

## 2017-05-28 ENCOUNTER — Other Ambulatory Visit: Payer: Self-pay | Admitting: *Deleted

## 2017-05-28 MED ORDER — LINACLOTIDE 290 MCG PO CAPS: 290.0000 ug | ORAL_CAPSULE | Freq: Every day | ORAL | 3 refills | Status: DC

## 2017-05-28 NOTE — Telephone Encounter (Signed)
PRINTED FOR PASS PROGRAM 

## 2017-06-06 DIAGNOSIS — Z1231 Encounter for screening mammogram for malignant neoplasm of breast: Secondary | ICD-10-CM | POA: Diagnosis not present

## 2017-06-10 DIAGNOSIS — G894 Chronic pain syndrome: Secondary | ICD-10-CM | POA: Diagnosis not present

## 2017-06-10 DIAGNOSIS — M7551 Bursitis of right shoulder: Secondary | ICD-10-CM | POA: Diagnosis not present

## 2017-06-10 DIAGNOSIS — G90511 Complex regional pain syndrome I of right upper limb: Secondary | ICD-10-CM | POA: Diagnosis not present

## 2017-06-10 DIAGNOSIS — M47812 Spondylosis without myelopathy or radiculopathy, cervical region: Secondary | ICD-10-CM | POA: Diagnosis not present

## 2017-06-17 ENCOUNTER — Other Ambulatory Visit: Payer: Self-pay | Admitting: Family Medicine

## 2017-06-17 DIAGNOSIS — K219 Gastro-esophageal reflux disease without esophagitis: Secondary | ICD-10-CM

## 2017-06-17 DIAGNOSIS — I10 Essential (primary) hypertension: Secondary | ICD-10-CM

## 2017-06-17 MED FILL — AMLODIPINE BESYLATE 10 MG T: 10 | 30 days supply | Qty: 30 | Fill #0

## 2017-06-17 MED FILL — !LINZESS 290 MCG CAPSULE: 290 | 30 days supply | Qty: 30 | Fill #2

## 2017-06-17 MED FILL — ?ESOMEPRAZOLE MAG DR 20 MG: 20 | 30 days supply | Qty: 30 | Fill #0

## 2017-06-17 MED FILL — ?CHLORTHALIDONE 25 MG TABLE: 25 | 30 days supply | Qty: 30 | Fill #1

## 2017-06-17 MED FILL — POTASSIUM CL 10 MEQ TAB SA: 10 | 30 days supply | Qty: 30 | Fill #3

## 2017-07-10 DIAGNOSIS — G90511 Complex regional pain syndrome I of right upper limb: Secondary | ICD-10-CM | POA: Diagnosis not present

## 2017-07-10 DIAGNOSIS — G894 Chronic pain syndrome: Secondary | ICD-10-CM | POA: Diagnosis not present

## 2017-07-10 DIAGNOSIS — M7551 Bursitis of right shoulder: Secondary | ICD-10-CM | POA: Diagnosis not present

## 2017-07-10 DIAGNOSIS — M47812 Spondylosis without myelopathy or radiculopathy, cervical region: Secondary | ICD-10-CM | POA: Diagnosis not present

## 2017-07-12 ENCOUNTER — Other Ambulatory Visit (HOSPITAL_BASED_OUTPATIENT_CLINIC_OR_DEPARTMENT_OTHER): Payer: Self-pay

## 2017-07-12 DIAGNOSIS — G4710 Hypersomnia, unspecified: Secondary | ICD-10-CM

## 2017-07-12 DIAGNOSIS — R5383 Other fatigue: Secondary | ICD-10-CM

## 2017-07-12 DIAGNOSIS — R0683 Snoring: Secondary | ICD-10-CM

## 2017-07-17 ENCOUNTER — Other Ambulatory Visit: Payer: Self-pay | Admitting: Family Medicine

## 2017-07-17 DIAGNOSIS — K219 Gastro-esophageal reflux disease without esophagitis: Secondary | ICD-10-CM

## 2017-07-17 DIAGNOSIS — I10 Essential (primary) hypertension: Secondary | ICD-10-CM

## 2017-07-17 MED FILL — ?CHLORTHALIDONE 25 MG TABLE: 25 | 30 days supply | Qty: 30 | Fill #2

## 2017-07-18 MED FILL — ?ESOMEPRAZOLE MAG DR 20 MG: 20 | 30 days supply | Qty: 30 | Fill #0

## 2017-07-18 MED FILL — AMLODIPINE BESYLATE 10 MG T: 10 | 30 days supply | Qty: 30 | Fill #0

## 2017-07-18 NOTE — Telephone Encounter (Signed)
CMA call regarding medication refill being [facility]   Patient verify DOB   Patient was aware and understood

## 2017-08-06 ENCOUNTER — Ambulatory Visit (HOSPITAL_BASED_OUTPATIENT_CLINIC_OR_DEPARTMENT_OTHER): Payer: Medicare Other | Attending: Physical Medicine and Rehabilitation | Admitting: Internal Medicine

## 2017-08-06 DIAGNOSIS — R51 Headache: Secondary | ICD-10-CM | POA: Diagnosis not present

## 2017-08-06 DIAGNOSIS — G4710 Hypersomnia, unspecified: Secondary | ICD-10-CM

## 2017-08-06 DIAGNOSIS — G4719 Other hypersomnia: Secondary | ICD-10-CM | POA: Diagnosis not present

## 2017-08-06 DIAGNOSIS — R5383 Other fatigue: Secondary | ICD-10-CM | POA: Diagnosis not present

## 2017-08-06 DIAGNOSIS — R0683 Snoring: Secondary | ICD-10-CM | POA: Diagnosis not present

## 2017-08-07 DIAGNOSIS — G90511 Complex regional pain syndrome I of right upper limb: Secondary | ICD-10-CM | POA: Diagnosis not present

## 2017-08-07 DIAGNOSIS — G4700 Insomnia, unspecified: Secondary | ICD-10-CM | POA: Diagnosis not present

## 2017-08-07 DIAGNOSIS — G4733 Obstructive sleep apnea (adult) (pediatric): Secondary | ICD-10-CM | POA: Diagnosis not present

## 2017-08-07 DIAGNOSIS — Z79891 Long term (current) use of opiate analgesic: Secondary | ICD-10-CM | POA: Diagnosis not present

## 2017-08-07 DIAGNOSIS — G894 Chronic pain syndrome: Secondary | ICD-10-CM | POA: Diagnosis not present

## 2017-08-07 DIAGNOSIS — M47812 Spondylosis without myelopathy or radiculopathy, cervical region: Secondary | ICD-10-CM | POA: Diagnosis not present

## 2017-08-07 DIAGNOSIS — M7551 Bursitis of right shoulder: Secondary | ICD-10-CM | POA: Diagnosis not present

## 2017-08-07 DIAGNOSIS — Z23 Encounter for immunization: Secondary | ICD-10-CM | POA: Diagnosis not present

## 2017-08-10 DIAGNOSIS — R0683 Snoring: Secondary | ICD-10-CM | POA: Diagnosis not present

## 2017-08-10 NOTE — Procedures (Signed)
   Patient Name: [name], [name] Date: 08/06/2017 Gender: Female D.O.B: [birth year] Age (years): 50 Referring Provider: Verdon Cummins Height (inches): 64 Interpreting Physician: Jetty Duhamel MD, ABSM Weight (lbs): 154 RPSGT: Shelah Lewandowsky BMI: 26 MRN: [id number] Neck Size: 15.00 CLINICAL INFORMATION Sleep Study Type: NPSG  Indication for sleep study: Excessive Daytime Sleepiness, Fatigue, Morning Headaches, Snoring  Epworth Sleepiness Score: 14  SLEEP STUDY TECHNIQUE As per the AASM Manual for the Scoring of Sleep and Associated Events v2.3 (April 2016) with a hypopnea requiring 4% desaturations.  The channels recorded and monitored were frontal, central and occipital EEG, electrooculogram (EOG), submentalis EMG (chin), nasal and oral airflow, thoracic and abdominal wall motion, anterior tibialis EMG, snore microphone, electrocardiogram, and pulse oximetry.  MEDICATIONS Medications self-administered by patient taken the night of the study : none reported  SLEEP ARCHITECTURE The study was initiated at 9:42:02 PM and ended at 5:07:40 AM.  Sleep onset time was 45.6 minutes and the sleep efficiency was 59.1%. The total sleep time was 263.5 minutes.  Stage REM latency was 316.5 minutes.  The patient spent 15.75% of the night in stage N1 sleep, 69.07% in stage N2 sleep, 0.00% in stage N3 and 15.18% in REM.  Alpha intrusion was absent.  Supine sleep was 45.16%.  RESPIRATORY PARAMETERS The overall apnea/hypopnea index (AHI) was 2.7 per hour. There were 1 total apneas, including 1 obstructive, 0 central and 0 mixed apneas. There were 11 hypopneas and 34 RERAs.  The AHI during Stage REM sleep was 9.0 per hour.  AHI while supine was 5.0 per hour.  The mean oxygen saturation was 95.96%. The minimum SpO2 during sleep was 89.00%.  moderate snoring was noted during this study.  CARDIAC DATA The 2 lead EKG demonstrated sinus rhythm. The mean heart rate was 80.42 beats per  minute. Other EKG findings include: None.  LEG MOVEMENT DATA The total PLMS were 102 with a resulting PLMS index of 23.23. Associated arousal with leg movement index was 6.1 .  IMPRESSIONS - No significant obstructive sleep apnea occurred during this study (AHI = 2.7/h). - No significant central sleep apnea occurred during this study (CAI = 0.0/h). - The patient had minimal or no oxygen desaturation during the study (Min O2 = 89.00%) - The patient snored with moderate snoring volume. - No cardiac abnormalities were noted during this study. - Mild periodic limb movements of sleep occurred during the study. Associated arousals were significant.  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 - Sleep hygiene should be reviewed to assess factors that may improve sleep quality. - Weight management and regular exercise should be initiated or continued if appropriate.  [Electronically signed] 08/10/2017 04:21 PM  Jetty Duhamel MD, ABSM Diplomate, American Board of Sleep Medicine   NPI: [id number]  Waymon Budge Diplomate, American Board of Sleep Medicine  ELECTRONICALLY SIGNED ON:  08/10/2017, 4:19 PM [facility] PH: [phone]   FX: [phone] ACCREDITED BY THE AMERICAN ACADEMY OF SLEEP MEDICINE

## 2017-08-12 ENCOUNTER — Other Ambulatory Visit: Payer: Self-pay | Admitting: Family Medicine

## 2017-08-12 ENCOUNTER — Other Ambulatory Visit: Payer: Self-pay | Admitting: Internal Medicine

## 2017-08-12 DIAGNOSIS — E876 Hypokalemia: Secondary | ICD-10-CM

## 2017-08-12 MED FILL — AMLODIPINE BESYLATE 10 MG T: 10 | 30 days supply | Qty: 30 | Fill #1

## 2017-08-12 MED FILL — ?CHLORTHALIDONE 25 MG TABLE: 25 | 30 days supply | Qty: 30 | Fill #0

## 2017-08-12 MED FILL — $Linzess 290mcg Capsule: 290 | 30 days supply | Qty: 30 | Fill #3

## 2017-08-12 MED FILL — ESOMEPRAZOLE MAG DR 20 MG C: 20 | 30 days supply | Qty: 30 | Fill #1

## 2017-08-13 ENCOUNTER — Other Ambulatory Visit: Payer: Self-pay | Admitting: Family Medicine

## 2017-09-04 DIAGNOSIS — M7551 Bursitis of right shoulder: Secondary | ICD-10-CM | POA: Diagnosis not present

## 2017-09-04 DIAGNOSIS — G90511 Complex regional pain syndrome I of right upper limb: Secondary | ICD-10-CM | POA: Diagnosis not present

## 2017-09-04 DIAGNOSIS — G894 Chronic pain syndrome: Secondary | ICD-10-CM | POA: Diagnosis not present

## 2017-09-04 DIAGNOSIS — M47812 Spondylosis without myelopathy or radiculopathy, cervical region: Secondary | ICD-10-CM | POA: Diagnosis not present

## 2017-09-06 DIAGNOSIS — N939 Abnormal uterine and vaginal bleeding, unspecified: Secondary | ICD-10-CM | POA: Diagnosis not present

## 2017-09-06 DIAGNOSIS — N898 Other specified noninflammatory disorders of vagina: Secondary | ICD-10-CM | POA: Diagnosis not present

## 2017-09-06 DIAGNOSIS — N950 Postmenopausal bleeding: Secondary | ICD-10-CM | POA: Diagnosis not present

## 2017-09-09 ENCOUNTER — Other Ambulatory Visit: Payer: Self-pay | Admitting: Internal Medicine

## 2017-09-09 MED FILL — ?CHLORTHALIDONE 25 MG TABLE: 25 | 30 days supply | Qty: 30 | Fill #1

## 2017-09-09 MED FILL — ?ESOMEPRAZOLE MAG DR 20MG C: 20 | 30 days supply | Qty: 30 | Fill #2

## 2017-09-09 MED FILL — $Linzess 290mcg Capsule: 290 | 30 days supply | Qty: 30 | Fill #0

## 2017-09-09 MED FILL — AMLODIPINE BESYLATE 10 MG T: 10 | 30 days supply | Qty: 30 | Fill #2

## 2017-10-02 ENCOUNTER — Other Ambulatory Visit: Payer: Self-pay | Admitting: Family Medicine

## 2017-10-02 DIAGNOSIS — K219 Gastro-esophageal reflux disease without esophagitis: Secondary | ICD-10-CM

## 2017-10-02 DIAGNOSIS — I10 Essential (primary) hypertension: Secondary | ICD-10-CM

## 2017-10-02 MED FILL — ?POTASSIUM CL ER 10MEQ TAB: 10 | 30 days supply | Qty: 30 | Fill #0

## 2017-10-03 DIAGNOSIS — M7551 Bursitis of right shoulder: Secondary | ICD-10-CM | POA: Diagnosis not present

## 2017-10-03 DIAGNOSIS — G894 Chronic pain syndrome: Secondary | ICD-10-CM | POA: Diagnosis not present

## 2017-10-03 DIAGNOSIS — G90511 Complex regional pain syndrome I of right upper limb: Secondary | ICD-10-CM | POA: Diagnosis not present

## 2017-10-03 DIAGNOSIS — M47812 Spondylosis without myelopathy or radiculopathy, cervical region: Secondary | ICD-10-CM | POA: Diagnosis not present

## 2017-10-04 ENCOUNTER — Other Ambulatory Visit: Payer: Self-pay | Admitting: Family Medicine

## 2017-10-04 DIAGNOSIS — I10 Essential (primary) hypertension: Secondary | ICD-10-CM

## 2017-10-04 NOTE — Telephone Encounter (Signed)
Pt called to request a refill for amLODipine (NORVASC) 10 MG tablet  chlorthalidone (HYGROTON) 25 MG tablet esomeprazole (NEXIUM) 20 MG capsule  [facility], please follow up

## 2017-10-07 ENCOUNTER — Other Ambulatory Visit: Payer: Self-pay | Admitting: Family Medicine

## 2017-10-07 DIAGNOSIS — I10 Essential (primary) hypertension: Secondary | ICD-10-CM

## 2017-10-07 MED ORDER — CHLORTHALIDONE 25 MG PO TABS: 25.0000 mg | ORAL_TABLET | Freq: Every day | ORAL | 0 refills | Status: DC

## 2017-10-07 MED FILL — AMLODIPINE BESYLATE 10 MG T: 10 | 30 days supply | Qty: 30 | Fill #0

## 2017-10-07 MED FILL — ?CHLORTHALIDONE 25 MG TABLE: 25 | 30 days supply | Qty: 30 | Fill #0

## 2017-10-07 NOTE — Telephone Encounter (Signed)
CMA call regarding medication refill sent   Patient Verify DOB   Patient was aware and understood

## 2017-10-11 ENCOUNTER — Ambulatory Visit: Payer: Medicare Other | Admitting: Urology

## 2017-10-16 ENCOUNTER — Ambulatory Visit: Payer: Medicare Other

## 2017-10-16 ENCOUNTER — Encounter: Payer: Medicare Other | Admitting: Family Medicine

## 2017-10-23 ENCOUNTER — Ambulatory Visit: Payer: Self-pay | Attending: Family Medicine

## 2017-11-01 ENCOUNTER — Encounter: Payer: Medicare Other | Admitting: Family Medicine

## 2017-11-01 DIAGNOSIS — G90511 Complex regional pain syndrome I of right upper limb: Secondary | ICD-10-CM | POA: Diagnosis not present

## 2017-11-01 DIAGNOSIS — M7551 Bursitis of right shoulder: Secondary | ICD-10-CM | POA: Diagnosis not present

## 2017-11-01 DIAGNOSIS — M47812 Spondylosis without myelopathy or radiculopathy, cervical region: Secondary | ICD-10-CM | POA: Diagnosis not present

## 2017-11-01 DIAGNOSIS — G894 Chronic pain syndrome: Secondary | ICD-10-CM | POA: Diagnosis not present

## 2017-11-15 DIAGNOSIS — H35412 Lattice degeneration of retina, left eye: Secondary | ICD-10-CM | POA: Diagnosis not present

## 2017-11-15 DIAGNOSIS — H2513 Age-related nuclear cataract, bilateral: Secondary | ICD-10-CM | POA: Diagnosis not present

## 2017-11-21 ENCOUNTER — Other Ambulatory Visit (HOSPITAL_COMMUNITY)
Admission: RE | Admit: 2017-11-21 | Discharge: 2017-11-21 | Disposition: A | Discharge status: Home / Self Care | Payer: Medicare Other | Source: Ambulatory Visit | Attending: Family Medicine | Admitting: Family Medicine

## 2017-11-21 ENCOUNTER — Ambulatory Visit: Payer: Medicare Other | Attending: Family Medicine | Admitting: Family Medicine

## 2017-11-21 ENCOUNTER — Encounter: Payer: Self-pay | Admitting: Family Medicine

## 2017-11-21 VITALS — BP 124/80 | HR 80 | Temp 98.3°F | Resp 18 | Ht 64.0 in | Wt 147.0 lb

## 2017-11-21 DIAGNOSIS — Z113 Encounter for screening for infections with a predominantly sexual mode of transmission: Secondary | ICD-10-CM | POA: Insufficient documentation

## 2017-11-21 DIAGNOSIS — K219 Gastro-esophageal reflux disease without esophagitis: Secondary | ICD-10-CM

## 2017-11-21 DIAGNOSIS — Z833 Family history of diabetes mellitus: Secondary | ICD-10-CM | POA: Insufficient documentation

## 2017-11-21 DIAGNOSIS — N6311 Unspecified lump in the right breast, upper outer quadrant: Secondary | ICD-10-CM | POA: Insufficient documentation

## 2017-11-21 DIAGNOSIS — R0981 Nasal congestion: Secondary | ICD-10-CM | POA: Diagnosis not present

## 2017-11-21 DIAGNOSIS — J3489 Other specified disorders of nose and nasal sinuses: Secondary | ICD-10-CM | POA: Insufficient documentation

## 2017-11-21 DIAGNOSIS — Z1239 Encounter for other screening for malignant neoplasm of breast: Secondary | ICD-10-CM

## 2017-11-21 DIAGNOSIS — I10 Essential (primary) hypertension: Secondary | ICD-10-CM | POA: Insufficient documentation

## 2017-11-21 DIAGNOSIS — Z79899 Other long term (current) drug therapy: Secondary | ICD-10-CM | POA: Insufficient documentation

## 2017-11-21 DIAGNOSIS — E785 Hyperlipidemia, unspecified: Secondary | ICD-10-CM | POA: Insufficient documentation

## 2017-11-21 DIAGNOSIS — Z8249 Family history of ischemic heart disease and other diseases of the circulatory system: Secondary | ICD-10-CM | POA: Insufficient documentation

## 2017-11-21 DIAGNOSIS — Z1321 Encounter for screening for nutritional disorder: Secondary | ICD-10-CM | POA: Diagnosis not present

## 2017-11-21 DIAGNOSIS — Z0000 Encounter for general adult medical examination without abnormal findings: Secondary | ICD-10-CM | POA: Diagnosis not present

## 2017-11-21 DIAGNOSIS — Z1389 Encounter for screening for other disorder: Secondary | ICD-10-CM | POA: Insufficient documentation

## 2017-11-21 DIAGNOSIS — Z23 Encounter for immunization: Secondary | ICD-10-CM | POA: Insufficient documentation

## 2017-11-21 MED ORDER — CHLORTHALIDONE 25 MG PO TABS
25.0000 mg | ORAL_TABLET | Freq: Every day | ORAL | 5 refills | Status: DC
Start: 2017-11-21 — End: 2018-05-26

## 2017-11-21 MED ORDER — BACITRACIN-NEOMYCIN-POLYMYXIN 400-5-5000 EX OINT: TOPICAL_OINTMENT | CUTANEOUS | 0 refills | Status: DC

## 2017-11-21 MED ORDER — AMLODIPINE BESYLATE 10 MG PO TABS: 10.0000 mg | ORAL_TABLET | Freq: Every day | ORAL | 5 refills | Status: DC

## 2017-11-21 MED ORDER — ESOMEPRAZOLE MAGNESIUM 20 MG PO CPDR: 20.0000 mg | DELAYED_RELEASE_CAPSULE | Freq: Every day | ORAL | 5 refills | Status: DC

## 2017-11-21 MED ORDER — FLUTICASONE PROPIONATE 50 MCG/ACT NA SUSP: 2.0000 | Freq: Every day | NASAL | 3 refills | Status: DC | PRN

## 2017-11-21 MED FILL — ?POTASSIUM CL ER 10MEQ TAB: 10 | 30 days supply | Qty: 30 | Fill #1

## 2017-11-21 MED FILL — AMLODIPINE BESYLATE 10 MG T: 10 | 30 days supply | Qty: 30 | Fill #0

## 2017-11-21 MED FILL — FLUTICASONE PROP 50 MCG SPR: 50 | 30 days supply | Qty: 16 | Fill #0

## 2017-11-21 MED FILL — ?ESOMEPRAZOLE MAG DR 20MG C: 20 | 30 days supply | Qty: 30 | Fill #0

## 2017-11-21 MED FILL — ?CHLORTHALIDONE 25 MG TABLE: 25 | 30 days supply | Qty: 30 | Fill #0

## 2017-11-21 NOTE — Patient Instructions (Addendum)
Go to pharmacy for tetanus vaccine.   Managing Your Hypertension Hypertension is commonly called high blood pressure. This is when the force of your blood pressing against the walls of your arteries is too strong. Arteries are blood vessels that carry blood from your heart throughout your body. Hypertension forces the heart to work harder to pump blood, and may cause the arteries to become narrow or stiff. Having untreated or uncontrolled hypertension can cause heart attack, stroke, kidney disease, and other problems. What are blood pressure readings? A blood pressure reading consists of a higher number over a lower number. Ideally, your blood pressure should be below 120/80. The first ("top") number is called the systolic pressure. It is a measure of the pressure in your arteries as your heart beats. The second ("bottom") number is called the diastolic pressure. It is a measure of the pressure in your arteries as the heart relaxes. What does my blood pressure reading mean? Blood pressure is classified into four stages. Based on your blood pressure reading, your health care provider may use the following stages to determine what type of treatment you need, if any. Systolic pressure and diastolic pressure are measured in a unit called mm Hg. Normal  Systolic pressure: below 120.  Diastolic pressure: below 80. Elevated  Systolic pressure: 120-129.  Diastolic pressure: below 80. Hypertension stage 1  Systolic pressure: 130-139.  Diastolic pressure: 80-89. Hypertension stage 2  Systolic pressure: 140 or above.  Diastolic pressure: 90 or above. What health risks are associated with hypertension? Managing your hypertension is an important responsibility. Uncontrolled hypertension can lead to:  A heart attack.  A stroke.  A weakened blood vessel (aneurysm).  Heart failure.  Kidney damage.  Eye damage.  Metabolic syndrome.  Memory and concentration problems.  What changes can  I make to manage my hypertension? Hypertension can be managed by making lifestyle changes and possibly by taking medicines. Your health care provider will help you make a plan to bring your blood pressure within a normal range. Eating and drinking  Eat a diet that is high in fiber and potassium, and low in salt (sodium), added sugar, and fat. An example eating plan is called the DASH (Dietary Approaches to Stop Hypertension) diet. To eat this way: ? Eat plenty of fresh fruits and vegetables. Try to fill half of your plate at each meal with fruits and vegetables. ? Eat whole grains, such as whole wheat pasta, brown rice, or whole grain bread. Fill about one quarter of your plate with whole grains. ? Eat low-fat diary products. ? Avoid fatty cuts of meat, processed or cured meats, and poultry with skin. Fill about one quarter of your plate with lean proteins such as fish, chicken without skin, beans, eggs, and tofu. ? Avoid premade and processed foods. These tend to be higher in sodium, added sugar, and fat.  Reduce your daily sodium intake. Most people with hypertension should eat less than 1,500 mg of sodium a day.  Limit alcohol intake to no more than 1 drink a day for nonpregnant women and 2 drinks a day for men. One drink equals 12 oz of beer, 5 oz of wine, or 1 oz of hard liquor. Lifestyle  Work with your health care provider to maintain a healthy body weight, or to lose weight. Ask what an ideal weight is for you.  Get at least 30 minutes of exercise that causes your heart to beat faster (aerobic exercise) most days of the week. Activities may  include walking, swimming, or biking.  Include exercise to strengthen your muscles (resistance exercise), such as weight lifting, as part of your weekly exercise routine. Try to do these types of exercises for 30 minutes at least 3 days a week.  Do not use any products that contain nicotine or tobacco, such as cigarettes and e-cigarettes. If you  need help quitting, ask your health care provider.  Control any long-term (chronic) conditions you have, such as high cholesterol or diabetes. Monitoring  Monitor your blood pressure at home as told by your health care provider. Your personal target blood pressure may vary depending on your medical conditions, your age, and other factors.  Have your blood pressure checked regularly, as often as told by your health care provider. Working with your health care provider  Review all the medicines you take with your health care provider because there may be side effects or interactions.  Talk with your health care provider about your diet, exercise habits, and other lifestyle factors that may be contributing to hypertension.  Visit your health care provider regularly. Your health care provider can help you create and adjust your plan for managing hypertension. Will I need medicine to control my blood pressure? Your health care provider may prescribe medicine if lifestyle changes are not enough to get your blood pressure under control, and if:  Your systolic blood pressure is 130 or higher.  Your diastolic blood pressure is 80 or higher.  Take medicines only as told by your health care provider. Follow the directions carefully. Blood pressure medicines must be taken as prescribed. The medicine does not work as well when you skip doses. Skipping doses also puts you at risk for problems. Contact a health care provider if:  You think you are having a reaction to medicines you have taken.  You have repeated (recurrent) headaches.  You feel dizzy.  You have swelling in your ankles.  You have trouble with your vision. Get help right away if:  You develop a severe headache or confusion.  You have unusual weakness or numbness, or you feel faint.  You have severe pain in your chest or abdomen.  You vomit repeatedly.  You have trouble breathing. Summary  Hypertension is when the force  of blood pumping through your arteries is too strong. If this condition is not controlled, it may put you at risk for serious complications.  Your personal target blood pressure may vary depending on your medical conditions, your age, and other factors. For most people, a normal blood pressure is less than 120/80.  Hypertension is managed by lifestyle changes, medicines, or both. Lifestyle changes include weight loss, eating a healthy, low-sodium diet, exercising more, and limiting alcohol. This information is not intended to replace advice given to you by your health care provider. Make sure you discuss any questions you have with your health care provider. Document Released: 07/16/2012 Document Revised: 09/19/2016 Document Reviewed: 09/19/2016 Elsevier Interactive Patient Education  [employer]2018 [employer]

## 2017-11-21 NOTE — Progress Notes (Signed)
Subjective:   Patient ID: [name], female    DOB: April 23, 1966, 52 y.o.   MRN: [id number]  Chief Complaint  Patient presents with  . Annual Exam   HPI [name] 52 y.o. female presents with for comprehensive physical examination. She denies any chest pain, dyspnea, or claudication symptoms. She reports family history of DM& HTN- mother, and brother. She denies any family history of cancers. She is not a current smoker. She denies any alcohol, tobacco, or substance use. She does c/o history of chronic nasal congestion and rhinorrhea. She takes Claritin for relief of symptoms. Denies symptoms of all other pertinent systems.   Past Medical History:  Diagnosis Date  . 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 Anemia   . Constipation   . GERD (gastroesophageal reflux disease)   . Hemorrhoids   . HTN (hypertension)   . Hyperlipidemia   . Seasonal allergies 08-13-13   tx. Claritin  . Vertigo     Past Surgical History:  Procedure Laterality Date  . ANAL FISTULECTOMY  08/22/2012   Procedure: FISTULECTOMY ANAL;  Surgeon: Lodema Pilot, DO;  Location: [facility];  Service: General;  Laterality: N/A;  rectal examination under anesthesia with seton placement   . ANAL FISTULECTOMY  09/17/2012   Procedure: FISTULECTOMY ANAL;  Surgeon: Lodema Pilot, DO;  Location: [facility];  Service: General;  Laterality: N/A;  possible fistulotomy  . D & C HYSTEROSCOPY/ NOVASURE ENDOMETRIAL ABLATION/ I & D THROMBOSED HEMORROID  01-10-2007  DR Miguel Aschoff  . EXAMINATION UNDER ANESTHESIA  09/17/2012   Procedure: EXAM UNDER ANESTHESIA;  Surgeon: Lodema Pilot, DO;  Location: [facility];  Service: General;  Laterality: N/A;  Rectal exam under anesthesia  . EXAMINATION UNDER ANESTHESIA N/A 05/14/2013   Procedure: EXAM UNDER ANESTHESIA;  Surgeon: Romie Levee, MD;  Location[facility];  Service: General;  Laterality: N/A;  . FISTULA PLUG  09/17/2012   Procedure: FISTULA PLUG;  Surgeon:  Lodema Pilot, DO;  Location: [facility];  Service: General;  Laterality: N/A;  Possible fistula plug  . HEMORRHOID SURGERY  04-28-2009   PPH INTERNAL HEMORRHOIDS  . IRRIGATION AND DEBRIDEMENT ABSCESS  07/08/2012   Procedure: MINOR INCISION AND DRAINAGE OF ABSCESS;  Surgeon: Geryl Rankins, MD;  Location: [facility] [place];  Service: Gynecology;  Laterality: N/A;  Vulvar Abscess  . PLACEMENT OF SETON N/A 05/14/2013   Procedure:  PLACEMENT OF SETON;  Surgeon: Romie Levee, MD;  Location: [facility];  Service: General;  Laterality: N/A;  . RECTAL EXAM UNDER ANESTHESIA N/A 08/20/2013   Procedure: mucosal advancement flap ;  Surgeon: Romie Levee, MD;  Location: [facility];  Service: General;  Laterality: N/A;  parks anal retractor long rectal instrucments prone jack knife anal fistula   . RECTAL ULTRASOUND N/A 07/20/2013   Procedure: RECTAL ULTRASOUND;  Surgeon: Romie Levee, MD;  Location: [facility];  Service: Endoscopy;  Laterality: N/A;    Family History  Problem Relation Age of Onset  . Diabetes Mother   . Heart attack Father   . Diabetes Brother   . Diabetes Maternal Uncle   . Diabetes Maternal Aunt   . Seizures Sister   . Colon cancer Neg Hx   . Stomach cancer Neg Hx     Social History   Socioeconomic History  . Marital status: Single    Spouse name: Not on file  . Number of children: 2  . Years of education: 10  .  Highest education level: Not on file  Social Needs  . Financial resource strain: Not on file  . Food insecurity - worry: Not on file  . Food insecurity - inability: Not on file  . Transportation needs - medical: Not on file  . Transportation needs - non-medical: Not on file  Occupational History  . Occupation: N/A  Tobacco Use  . Smoking status: Never Smoker  . Smokeless tobacco: Never Used  Substance and Sexual Activity  . Alcohol use: Yes    Alcohol/week: 0.0 oz    Comment: occa  . Drug use: No  . Sexual activity:  Yes    Birth control/protection: Pill  Other Topics Concern  . Not on file  Social History Narrative   Lives at home w/ her mother   Right-handed   Caffeine: 1 cup of tea per day    Outpatient Medications Prior to Visit  Medication Sig Dispense Refill  . docusate sodium (COLACE) 100 MG capsule Take 1 capsule (100 mg total) by mouth 2 (two) times daily. 60 capsule 3  . DULoxetine (CYMBALTA) 30 MG capsule Take 1 capsule by mouth at bedtime.    Marland Kitchen estradiol (ESTRACE) 0.5 MG tablet Take 0.5 mg by mouth daily.    Marland Kitchen LINZESS 290 MCG CAPS capsule TAKE 1 CAPSULE BY MOUTH DAILY BEFORE BREAKFAST. 30 capsule 3  . loratadine (CLARITIN) 10 MG tablet Take 10 mg by mouth daily.    . meclizine (ANTIVERT) 25 MG tablet Take 1 tablet (25 mg total) by mouth 3 (three) times daily as needed for dizziness. 30 tablet 0  . medroxyPROGESTERone (PROVERA) 2.5 MG tablet Take 2.5 mg by mouth daily.    . naproxen (NAPROSYN) 500 MG tablet TAKE 1 TABLET BY MOUTH 2 TIMES DAILY WITH A MEAL. 60 tablet 0  . ondansetron (ZOFRAN) 4 MG tablet Take 1 tablet (4 mg total) by mouth every 8 (eight) hours as needed for nausea or vomiting. 20 tablet 0  . potassium chloride (K-DUR) 10 MEQ tablet TAKE 1 CAPSULE BY MOUTH DAILY. 30 tablet 3  . senna-docusate (SENNA S) 8.6-50 MG tablet Take 1-2 tablets by mouth at bedtime as needed for mild constipation or moderate constipation.    Marland Kitchen amLODipine (NORVASC) 10 MG tablet TAKE 1 TABLET BY MOUTH DAILY. 30 tablet 2  . chlorthalidone (HYGROTON) 25 MG tablet Take 1 tablet (25 mg total) by mouth daily. 30 tablet 0  . esomeprazole (NEXIUM) 20 MG capsule TAKE 1 CAPSULE BY MOUTH DAILY. 30 capsule 2  . omeprazole (PRILOSEC) 20 MG capsule Take 1 capsule (20 mg total) by mouth daily. 30 capsule 2  . oxyCODONE-acetaminophen (PERCOCET/ROXICET) 5-325 MG per tablet Take 2 tablets by mouth every 6 (six) hours as needed for severe pain. 15 tablet 0   No facility-administered medications prior to visit.      Allergies  Allergen Reactions  . Ampicillin Anaphylaxis  . Penicillins Anaphylaxis  . Sulfonamide Derivatives Anaphylaxis  . Hydrocodone-Acetaminophen Hives    Have to take a Benadryl to take med  . Tramadol Itching  . Tylenol [Acetaminophen] Itching    Review of Systems  Constitutional: Negative.   HENT: Positive for congestion (w/ rhinorrhea).   Eyes: Negative.   Respiratory: Negative.   Cardiovascular: Negative.   Gastrointestinal: Negative.   Genitourinary: Negative.   Musculoskeletal: Negative.   Skin: Negative.   Neurological: Negative.   Endo/Heme/Allergies: Negative.   Psychiatric/Behavioral: Negative.        Objective:    Physical Exam  Constitutional: She  is oriented to person, place, and time. She appears well-developed and well-nourished.  HENT:  Head: Normocephalic and atraumatic.  Right Ear: External ear normal.  Left Ear: External ear normal.  Nose: Mucosal edema, rhinorrhea and sinus tenderness (Nasal abrasion external left nare.) present.  Mouth/Throat: Oropharynx is clear and moist.  Eyes: Conjunctivae and EOM are normal. Pupils are equal, round, and reactive to light.  Neck: Normal range of motion. Neck supple.  Cardiovascular: Normal rate, regular rhythm, normal heart sounds and intact distal pulses.  Pulmonary/Chest: Effort normal and breath sounds normal. Right breast exhibits mass. Right breast exhibits no nipple discharge, no skin change and no tenderness. Left breast exhibits no mass, no nipple discharge, no skin change and no tenderness.    Abdominal: Soft. Bowel sounds are normal.  Musculoskeletal: Normal range of motion.  Neurological: She is alert and oriented to person, place, and time. She has normal reflexes.  Skin: Skin is warm and dry.  Psychiatric: She has a normal mood and affect.  Nursing note and vitals reviewed.   BP 124/80 (BP Location: Left Arm, Patient Position: Sitting, Cuff Size: Normal)   Pulse 80   Temp 98.3 F  (36.8 C) (Oral)   Resp 18   Ht 5\' 4"  (1.626 m)   Wt 147 lb (66.7 kg)   SpO2 98%   BMI 25.23 kg/m  Wt Readings from Last 3 Encounters:  11/21/17 147 lb (66.7 kg)  08/06/17 154 lb (69.9 kg)  04/17/17 149 lb 4 oz (67.7 kg)    Immunization History  Administered Date(s) Administered  . Influenza Whole 08/10/2009  . Influenza,inj,Quad PF,6+ Mos 11/21/2017  . Influenza-Unspecified 08/05/2016    No results found for: TSH Lab Results  Component Value Date   WBC 6.3 03/20/2017   HGB 14.9 03/20/2017   HCT 45.4 03/20/2017   MCV 93 03/20/2017   PLT 310 03/20/2017   Lab Results  Component Value Date   NA 139 03/20/2017   K 3.2 (L) 03/20/2017   CO2 31 (H) 03/20/2017   GLUCOSE 103 (H) 03/20/2017   BUN 11 03/20/2017   CREATININE 0.85 03/20/2017   BILITOT 0.3 03/20/2017   ALKPHOS 62 03/20/2017   AST 14 03/20/2017   ALT 8 03/20/2017   PROT 7.9 03/20/2017   ALBUMIN 4.7 03/20/2017   CALCIUM 10.4 (H) 03/20/2017   ANIONGAP 13 10/10/2016   GFR 113.70 03/01/2015   Lab Results  Component Value Date   CHOL 283 (H) 01/29/2017   CHOL  03/19/2009    176        ATP III CLASSIFICATION:  <200     mg/dL   Desirable  161-096200-239  mg/dL   Borderline High  >=045>=240    mg/dL   High          Lab Results  Component Value Date   HDL 59 01/29/2017   HDL 46 03/19/2009   Lab Results  Component Value Date   LDLCALC 200 (H) 01/29/2017   LDLCALC (H) 03/19/2009    117        Total Cholesterol/HDL:CHD Risk Coronary Heart Disease Risk Table                     Men   Women  1/2 Average Risk   3.4   3.3  Average Risk       5.0   4.4  2 X Average Risk   9.6   7.1  3 X Average Risk  23.4  11.0        Use the calculated Patient Ratio above and the CHD Risk Table to determine the patient's CHD Risk.        ATP III CLASSIFICATION (LDL):  <100     mg/dL   Optimal  161-096  mg/dL   Near or Above                    Optimal  130-159  mg/dL   Borderline  045-409  mg/dL   High  >811     mg/dL    Very High   Lab Results  Component Value Date   TRIG 120 01/29/2017   TRIG 64 03/19/2009   Lab Results  Component Value Date   CHOLHDL 4.8 (H) 01/29/2017   CHOLHDL 3.8 03/19/2009   No results found for: HGBA1C     Assessment & Plan:   1. Encounter for annual physical examination excluding gynecological examination in a patient older than 17 years  - CMP and Liver - CBC - TSH - Lipid Panel  2. Healthcare maintenance  - HEP, RPR, HIV Panel  3. Routine screening for STI (sexually transmitted infection)  - Urine cytology ancillary only - HEP, RPR, HIV Panel - HSV(herpes simplex vrs) 1+2 ab-IgG  4. Flu vaccine need  - Flu Vaccine QUAD 6+ mos PF IM (Fluarix Quad PF)  5. Screening for breast cancer  - MM Digital Diagnostic Bilat; Future  6. Nasal congestion with rhinorrhea  - fluticasone (FLONASE) 50 MCG/ACT nasal spray; Place 2 sprays into both nostrils daily as needed for allergies or rhinitis.  Dispense: 16 g; Refill: 3  7. Essential hypertension  - amLODipine (NORVASC) 10 MG tablet; Take 1 tablet (10 mg total) by mouth daily.  Dispense: 30 tablet; Refill: 5 - chlorthalidone (HYGROTON) 25 MG tablet; Take 1 tablet (25 mg total) by mouth daily.  Dispense: 30 tablet; Refill: 5  8. Gastroesophageal reflux disease, esophagitis presence not specified  - esomeprazole (NEXIUM) 20 MG capsule; Take 1 capsule (20 mg total) by mouth daily.  Dispense: 30 capsule; Refill: 5  9. Nasal sore  - neomycin-bacitracin-polymyxin (NEOSPORIN) ointment; Use as directed  Dispense: 15 g; Refill: 0  10. Encounter for vitamin deficiency screening  - Vitamin D, 25-hydroxy  11. Breast lump on right side at 10 o'clock position  - US BREAST COMPLETE UNI RIGHT INC AXILLA; Future - MM Digital Diagnostic Bilat; Future   Follow up: Return in about 6 months (around 05/21/2018) for HTN.   Arrie Senate, FNP

## 2017-11-22 LAB — HSV(HERPES SIMPLEX VRS) I + II AB-IGG
HSV 1 GLYCOPROTEIN G AB, IGG: 17.1 {index} — AB (ref 0.00–0.90)
HSV 2 IGG, TYPE SPEC: 10.3 {index} — AB (ref 0.00–0.90)

## 2017-11-22 LAB — HEP, RPR, HIV PANEL
HIV Screen 4th Generation wRfx: NONREACTIVE
Hepatitis B Surface Ag: NEGATIVE
RPR Ser Ql: NONREACTIVE

## 2017-11-22 LAB — URINE CYTOLOGY ANCILLARY ONLY
CHLAMYDIA, DNA PROBE: NEGATIVE
Neisseria Gonorrhea: NEGATIVE
Trichomonas: NEGATIVE

## 2017-11-25 ENCOUNTER — Other Ambulatory Visit: Payer: Self-pay | Admitting: Family Medicine

## 2017-11-25 DIAGNOSIS — N6311 Unspecified lump in the right breast, upper outer quadrant: Secondary | ICD-10-CM

## 2017-11-25 LAB — URINE CYTOLOGY ANCILLARY ONLY
BACTERIAL VAGINITIS: POSITIVE — AB
Candida vaginitis: NEGATIVE

## 2017-11-26 ENCOUNTER — Other Ambulatory Visit: Payer: Self-pay | Admitting: Family Medicine

## 2017-11-26 DIAGNOSIS — N760 Acute vaginitis: Principal | ICD-10-CM

## 2017-11-26 DIAGNOSIS — B9689 Other specified bacterial agents as the cause of diseases classified elsewhere: Secondary | ICD-10-CM

## 2017-11-26 MED ORDER — METRONIDAZOLE 500 MG PO TABS: 500.0000 mg | ORAL_TABLET | Freq: Two times a day (BID) | ORAL | 0 refills | Status: DC

## 2017-11-27 DIAGNOSIS — H6090 Unspecified otitis externa, unspecified ear: Secondary | ICD-10-CM | POA: Diagnosis not present

## 2017-11-29 DIAGNOSIS — M47812 Spondylosis without myelopathy or radiculopathy, cervical region: Secondary | ICD-10-CM | POA: Diagnosis not present

## 2017-11-29 DIAGNOSIS — G90511 Complex regional pain syndrome I of right upper limb: Secondary | ICD-10-CM | POA: Diagnosis not present

## 2017-11-29 DIAGNOSIS — G894 Chronic pain syndrome: Secondary | ICD-10-CM | POA: Diagnosis not present

## 2017-11-29 DIAGNOSIS — M7551 Bursitis of right shoulder: Secondary | ICD-10-CM | POA: Diagnosis not present

## 2017-12-18 MED FILL — ?ESOMEPRAZOLE MAG DR 20MG C: 20 | 30 days supply | Qty: 30 | Fill #1

## 2017-12-18 MED FILL — AMLODIPINE BESYLATE 10 MG T: 10 | 30 days supply | Qty: 30 | Fill #1

## 2017-12-18 MED FILL — $Linzess 290mcg Capsule: 290 | 30 days supply | Qty: 30 | Fill #1

## 2017-12-18 MED FILL — ?POTASSIUM CL ER 10 MEQ TAB: 10 MEQ | 30 days supply | Qty: 30 | Fill #2

## 2017-12-18 MED FILL — ?CHLORTHALIDONE 25 MG TABLE: 25 | 30 days supply | Qty: 30 | Fill #1

## 2017-12-27 DIAGNOSIS — G894 Chronic pain syndrome: Secondary | ICD-10-CM | POA: Diagnosis not present

## 2017-12-27 DIAGNOSIS — M47812 Spondylosis without myelopathy or radiculopathy, cervical region: Secondary | ICD-10-CM | POA: Diagnosis not present

## 2017-12-27 DIAGNOSIS — G90511 Complex regional pain syndrome I of right upper limb: Secondary | ICD-10-CM | POA: Diagnosis not present

## 2017-12-27 DIAGNOSIS — Z79891 Long term (current) use of opiate analgesic: Secondary | ICD-10-CM | POA: Diagnosis not present

## 2017-12-27 DIAGNOSIS — M7551 Bursitis of right shoulder: Secondary | ICD-10-CM | POA: Diagnosis not present

## 2017-12-30 MED FILL — FLUTICASONE PROP 50 MCG SPR: 50 | 30 days supply | Qty: 16 | Fill #1

## 2018-01-16 MED FILL — POTASSIUM CL 10 MEQ TAB SA: 10 | 30 days supply | Qty: 30 | Fill #3

## 2018-01-16 MED FILL — ?ESOMEPRAZOLE MAG DR 20MG C: 20 | 30 days supply | Qty: 30 | Fill #2

## 2018-01-16 MED FILL — ?CHLORTHALIDONE 25 MG TABLE: 25 | 30 days supply | Qty: 30 | Fill #2

## 2018-01-16 MED FILL — AMLODIPINE BESYLATE 10 MG T: 10 | 30 days supply | Qty: 30 | Fill #2

## 2018-01-21 DIAGNOSIS — M5417 Radiculopathy, lumbosacral region: Secondary | ICD-10-CM | POA: Diagnosis not present

## 2018-01-24 DIAGNOSIS — M7551 Bursitis of right shoulder: Secondary | ICD-10-CM | POA: Diagnosis not present

## 2018-01-24 DIAGNOSIS — G90511 Complex regional pain syndrome I of right upper limb: Secondary | ICD-10-CM | POA: Diagnosis not present

## 2018-01-24 DIAGNOSIS — M47812 Spondylosis without myelopathy or radiculopathy, cervical region: Secondary | ICD-10-CM | POA: Diagnosis not present

## 2018-01-24 DIAGNOSIS — G894 Chronic pain syndrome: Secondary | ICD-10-CM | POA: Diagnosis not present

## 2018-02-13 MED FILL — $Linzess 290mcg Capsule: 290 | 30 days supply | Qty: 30 | Fill #2

## 2018-02-13 MED FILL — ?CHLORTHALIDONE 25 MG TABLE: 25 | 30 days supply | Qty: 30 | Fill #3

## 2018-02-13 MED FILL — AMLODIPINE BESYLATE 10 MG T: 10 | 30 days supply | Qty: 30 | Fill #3

## 2018-02-13 MED FILL — ?ESOMEPRAZOLE MAG DR 20MG C: 20 | 30 days supply | Qty: 30 | Fill #3

## 2018-02-20 DIAGNOSIS — M7551 Bursitis of right shoulder: Secondary | ICD-10-CM | POA: Diagnosis not present

## 2018-02-20 DIAGNOSIS — G894 Chronic pain syndrome: Secondary | ICD-10-CM | POA: Diagnosis not present

## 2018-02-20 DIAGNOSIS — M47812 Spondylosis without myelopathy or radiculopathy, cervical region: Secondary | ICD-10-CM | POA: Diagnosis not present

## 2018-02-20 DIAGNOSIS — G90511 Complex regional pain syndrome I of right upper limb: Secondary | ICD-10-CM | POA: Diagnosis not present

## 2018-03-10 MED FILL — AMLODIPINE BESYLATE 10 MG T: 10 | 30 days supply | Qty: 30 | Fill #4

## 2018-03-10 MED FILL — $Linzess 290mcg Capsule: 290 | 30 days supply | Qty: 30 | Fill #3

## 2018-03-10 MED FILL — ?CHLORTHALIDONE 25 MG TABLE: 25 | 30 days supply | Qty: 30 | Fill #4

## 2018-03-10 MED FILL — ?ESOMEPRAZOLE MAG DR 20MG C: 20 | 30 days supply | Qty: 30 | Fill #4

## 2018-03-12 ENCOUNTER — Encounter: Payer: Self-pay | Admitting: Internal Medicine

## 2018-03-12 ENCOUNTER — Ambulatory Visit: Payer: Medicare Other | Attending: Internal Medicine | Admitting: Internal Medicine

## 2018-03-12 VITALS — BP 119/80 | HR 75 | Temp 97.9°F | Resp 16 | Ht 64.0 in | Wt 149.4 lb

## 2018-03-12 DIAGNOSIS — Z8742 Personal history of other diseases of the female genital tract: Secondary | ICD-10-CM | POA: Diagnosis not present

## 2018-03-12 DIAGNOSIS — Z882 Allergy status to sulfonamides status: Secondary | ICD-10-CM | POA: Insufficient documentation

## 2018-03-12 DIAGNOSIS — K5909 Other constipation: Secondary | ICD-10-CM | POA: Diagnosis not present

## 2018-03-12 DIAGNOSIS — Z886 Allergy status to analgesic agent status: Secondary | ICD-10-CM | POA: Diagnosis not present

## 2018-03-12 DIAGNOSIS — N951 Menopausal and female climacteric states: Secondary | ICD-10-CM | POA: Diagnosis not present

## 2018-03-12 DIAGNOSIS — Z79899 Other long term (current) drug therapy: Secondary | ICD-10-CM | POA: Insufficient documentation

## 2018-03-12 DIAGNOSIS — Z888 Allergy status to other drugs, medicaments and biological substances status: Secondary | ICD-10-CM | POA: Insufficient documentation

## 2018-03-12 DIAGNOSIS — K219 Gastro-esophageal reflux disease without esophagitis: Secondary | ICD-10-CM | POA: Diagnosis not present

## 2018-03-12 DIAGNOSIS — Z880 Allergy status to penicillin: Secondary | ICD-10-CM | POA: Insufficient documentation

## 2018-03-12 DIAGNOSIS — Z885 Allergy status to narcotic agent status: Secondary | ICD-10-CM | POA: Insufficient documentation

## 2018-03-12 DIAGNOSIS — N924 Excessive bleeding in the premenopausal period: Secondary | ICD-10-CM | POA: Diagnosis not present

## 2018-03-12 DIAGNOSIS — I10 Essential (primary) hypertension: Secondary | ICD-10-CM | POA: Diagnosis not present

## 2018-03-12 DIAGNOSIS — E876 Hypokalemia: Secondary | ICD-10-CM | POA: Diagnosis not present

## 2018-03-12 MED ORDER — POTASSIUM CHLORIDE ER 10 MEQ PO TBCR: EXTENDED_RELEASE_TABLET | ORAL | 1 refills | Status: DC

## 2018-03-12 MED FILL — POTASSIUM CL ER 10 MEQ TAB: 10 | 30 days supply | Qty: 30 | Fill #0

## 2018-03-12 NOTE — Progress Notes (Signed)
Pt states she has been having cramps  Pt states sometimes when she goes to the bathroom she feels like her period is flowing but there isn't anything there  Pt is currently on period

## 2018-03-12 NOTE — Progress Notes (Signed)
Patient ID: [name], female    DOB: 08/12/1966  MRN: [id number]  CC: re-establish and Menstrual Problem   Subjective: [name] is a 52 y.o. female who presents for chronic disease management and to establish with me as PCP. Her concerns today include:  Pt with hx of HTN, chronic constipation, anal fistulas, vertigo and GERD Today her main concern is that she has a menstrual cycle that started 5 days ago. -She has a history of endometrial ablation that was done prior to 2010 due to menorrhagia.  Postprocedure her menses were lighter but more painful.  She was then placed on birth control pills during which time she did not have menses for years.  Her gynecologist Dr. Arlyce Dice stopped the BCP last November.  She did not have any menses after stopping the hormones until this past week.  Bleeding currently is light with some clots and cramps.  She endorses hot flashes at night.  No vaginal dryness  -She reports being up-to-date with Pap smear and mammogram through her gynecologist.  On last visit here in January of this year for annual exam there was a question of right breast mass.  However patient states that she has had a mammogram this year and was told that everything was okay.  I do not have copies of her mammogram or Pap smear on record  Patient Active Problem List   Diagnosis Date Noted  . Vertigo 10/18/2016  . Right arm pain 04/19/2014  . Disturbance of skin sensation 04/19/2014  . Anal fistula 04/14/2013  . UNSPECIFIED SITE OF SPRAIN AND STRAIN 07/07/2009  . PEDAL EDEMA 06/29/2009  . MUSCLE PAIN 06/23/2009  . 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 ALLERGIC RHINITIS 03/11/2009  . SYNCOPE, VASOVAGAL 03/11/2009  . HEADACHE 03/11/2009     Current Outpatient Medications on File Prior to Visit  Medication Sig Dispense Refill  . amLODipine (NORVASC) 10 MG tablet Take 1 tablet (10 mg total) by mouth daily. 30 tablet 5  . chlorthalidone (HYGROTON) 25 MG tablet Take 1 tablet (25 mg  total) by mouth daily. 30 tablet 5  . DULoxetine (CYMBALTA) 30 MG capsule Take 1 capsule by mouth at bedtime.    Marland Kitchen esomeprazole (NEXIUM) 20 MG capsule Take 1 capsule (20 mg total) by mouth daily. 30 capsule 5  . fluticasone (FLONASE) 50 MCG/ACT nasal spray Place 2 sprays into both nostrils daily as needed for allergies or rhinitis. 16 g 3  . LINZESS 290 MCG CAPS capsule TAKE 1 CAPSULE BY MOUTH DAILY BEFORE BREAKFAST. 30 capsule 3  . loratadine (CLARITIN) 10 MG tablet Take 10 mg by mouth daily.    . meclizine (ANTIVERT) 25 MG tablet Take 1 tablet (25 mg total) by mouth 3 (three) times daily as needed for dizziness. 30 tablet 0  . naproxen (NAPROSYN) 500 MG tablet TAKE 1 TABLET BY MOUTH 2 TIMES DAILY WITH A MEAL. 60 tablet 0  . neomycin-bacitracin-polymyxin (NEOSPORIN) ointment Use as directed 15 g 0   No current facility-administered medications on file prior to visit.     Allergies  Allergen Reactions  . Ampicillin Anaphylaxis  . Penicillins Anaphylaxis  . Sulfonamide Derivatives Anaphylaxis  . Hydrocodone-Acetaminophen Hives    Have to take a Benadryl to take med  . Tramadol Itching  . Tylenol [Acetaminophen] Itching    Social History   Socioeconomic History  . Marital status: Single    Spouse name: Not on file  . Number of children: 2  . Years of education:  10  . Highest education level: Not on file  Occupational History  . Occupation: N/A  Social Needs  . Financial resource strain: Not on file  . Food insecurity:    Worry: Not on file    Inability: Not on file  . Transportation needs:    Medical: Not on file    Non-medical: Not on file  Tobacco Use  . Smoking status: Never Smoker  . Smokeless tobacco: Never Used  Substance and Sexual Activity  . Alcohol use: Yes    Alcohol/week: 0.0 oz    Comment: occa  . Drug use: No  . Sexual activity: Yes    Birth control/protection: Pill  Lifestyle  . Physical activity:    Days per week: Not on file    Minutes per  session: Not on file  . Stress: Not on file  Relationships  . Social connections:    Talks on phone: Not on file    Gets together: Not on file    Attends religious service: Not on file    Active member of club or organization: Not on file    Attends meetings of clubs or organizations: Not on file    Relationship status: Not on file  . Intimate partner violence:    Fear of current or ex partner: Not on file    Emotionally abused: Not on file    Physically abused: Not on file    Forced sexual activity: Not on file  Other Topics Concern  . Not on file  Social History Narrative   Lives at home w/ her mother   Right-handed   Caffeine: 1 cup of tea per day    Family History  Problem Relation Age of Onset  . Diabetes Mother   . Heart attack Father   . Diabetes Brother   . Diabetes Maternal Uncle   . Diabetes Maternal Aunt   . Seizures Sister   . Colon cancer Neg Hx   . Stomach cancer Neg Hx     Past Surgical History:  Procedure Laterality Date  . ANAL FISTULECTOMY  08/22/2012   Procedure: FISTULECTOMY ANAL;  Surgeon: Lodema Pilot, DO;  Location: [facility];  Service: General;  Laterality: N/A;  rectal examination under anesthesia with seton placement   . ANAL FISTULECTOMY  09/17/2012   Procedure: FISTULECTOMY ANAL;  Surgeon: Lodema Pilot, DO;  Location: [facility];  Service: General;  Laterality: N/A;  possible fistulotomy  . D & C HYSTEROSCOPY/ NOVASURE ENDOMETRIAL ABLATION/ I & D THROMBOSED HEMORROID  01-10-2007  DR Miguel Aschoff  . EXAMINATION UNDER ANESTHESIA  09/17/2012   Procedure: EXAM UNDER ANESTHESIA;  Surgeon: Lodema Pilot, DO;  Location: [facility];  Service: General;  Laterality: N/A;  Rectal exam under anesthesia  . EXAMINATION UNDER ANESTHESIA N/A 05/14/2013   Procedure: EXAM UNDER ANESTHESIA;  Surgeon: Romie Levee, MD;  Location[facility]  Service: General;  Laterality: N/A;  . FISTULA PLUG  09/17/2012   Procedure:  FISTULA PLUG;  Surgeon: Lodema Pilot, DO;  Location: [facility];  Service: General;  Laterality: N/A;  Possible fistula plug  . HEMORRHOID SURGERY  04-28-2009   PPH INTERNAL HEMORRHOIDS  . IRRIGATION AND DEBRIDEMENT ABSCESS  07/08/2012   Procedure: MINOR INCISION AND DRAINAGE OF ABSCESS;  Surgeon: Geryl Rankins, MD;  Location: [facility];  Service: Gynecology;  Laterality: N/A;  Vulvar Abscess  . PLACEMENT OF SETON N/A 05/14/2013   Procedure:  PLACEMENT OF SETON;  Surgeon: Romie Levee, MD;  Location: [facility];  Service: General;  Laterality: N/A;  . RECTAL EXAM UNDER ANESTHESIA N/A 08/20/2013   Procedure: mucosal advancement flap ;  Surgeon: Romie Levee, MD;  Location: [facility];  Service: General;  Laterality: N/A;  parks anal retractor long rectal instrucments prone jack knife anal fistula   . RECTAL ULTRASOUND N/A 07/20/2013   Procedure: RECTAL ULTRASOUND;  Surgeon: Romie Levee, MD;  [facility];  Service: Endoscopy;  Laterality: N/A;    ROS: Review of Systems Negative except as stated above PHYSICAL EXAM: BP 119/80   Pulse 75   Temp 97.9 F (36.6 C) (Oral)   Resp 16   Ht  (1.626 m)   Wt 149 lb 6.4 oz (67.8 kg)   LMP 03/08/2018   SpO2 96%   BMI 25.64 kg/m   Wt Readings from Last 3 Encounters:  03/12/18 149 lb 6.4 oz (67.8 kg)  11/21/17 147 lb (66.7 kg)  08/06/17 154 lb (69.9 kg)   BP 112/80 sitting and standing  Physical Exam  General appearance - alert, well appearing, middle-aged African-American female and in no distress Mental status - normal mood, behavior, speech, dress, motor activity, and thought processes Eyes -slightly pale conjunctiva Neck - supple, no significant adenopathy Chest - clear to auscultation, no wheezes, rales or rhonchi, symmetric air entry Heart - normal rate, regular rhythm, normal S1, S2, no murmurs, rubs, clicks or gallops Extremities - peripheral pulses normal, no pedal edema,  no clubbing or cyanosis  Depression screen [facility] 2/9 03/12/2018 11/21/2017 03/20/2017  Decreased Interest 0 1 1  Down, Depressed, Hopeless 0 0 1  PHQ - 2 Score 0 1 2  Altered sleeping - 0 1  Tired, decreased energy - 1 1  Change in appetite - 0 1  Feeling bad or failure about yourself  - 0 0  Trouble concentrating - 0 1  Moving slowly or fidgety/restless - 0 0  Suicidal thoughts - 0 0  PHQ-9 Score - 2 6    Lab Results  Component Value Date   WBC 6.3 03/20/2017   HGB 14.9 03/20/2017   HCT 45.4 03/20/2017   MCV 93 03/20/2017   PLT 310 03/20/2017   ASSESSMENT AND PLAN: 1. Perimenopausal symptoms Patient is perimenopausal.  If indeed she was on BCP up until 6 mths ago then having period now, likely due to being perimenopause.  Will check hormone levels and probably refer her back to GYN - Follicle stimulating hormone - Luteinizing hormone  2. History of dysfunctional uterine bleeding   3. Essential hypertension At goal on current medication - CBC - Comprehensive metabolic panel  4. Hypokalemia - potassium chloride (K-DUR) 10 MEQ tablet; TAKE 1 CAPSULE BY MOUTH DAILY.  Dispense: 90 tablet; Refill: 1   Patient was given the opportunity to ask questions.  Patient verbalized understanding of the plan and was able to repeat key elements of the plan.   Orders Placed This Encounter  Procedures  . CBC  . Comprehensive metabolic panel  . Follicle stimulating hormone  . Luteinizing hormone     Requested Prescriptions   Signed Prescriptions Disp Refills  . potassium chloride (K-DUR) 10 MEQ tablet 90 tablet 1    Sig: TAKE 1 CAPSULE BY MOUTH DAILY.    Return in about 3 months (around 06/12/2018).  Jonah Blue, MD, FACP

## 2018-03-12 NOTE — Patient Instructions (Signed)
Perimenopause Perimenopause is the time when your body begins to move into the menopause (no menstrual period for 12 straight months). It is a natural process. Perimenopause can begin 2-8 years before the menopause and usually lasts for 1 year after the menopause. During this time, your ovaries may or may not produce an egg. The ovaries vary in their production of estrogen and progesterone hormones each month. This can cause irregular menstrual periods, difficulty getting pregnant, vaginal bleeding between periods, and uncomfortable symptoms. What are the causes?  Irregular production of the ovarian hormones, estrogen and progesterone, and not ovulating every month. Other causes include:  Tumor of the pituitary gland in the brain.  Medical disease that affects the ovaries.  Radiation treatment.  Chemotherapy.  Unknown causes.  Heavy smoking and excessive alcohol intake can bring on perimenopause sooner. What are the signs or symptoms?  Hot flashes.  Night sweats.  Irregular menstrual periods.  Decreased sex drive.  Vaginal dryness.  Headaches.  Mood swings.  Depression.  Memory problems.  Irritability.  Tiredness.  Weight gain.  Trouble getting pregnant.  The beginning of losing bone cells (osteoporosis).  The beginning of hardening of the arteries (atherosclerosis). How is this diagnosed? Your health care provider will make a diagnosis by analyzing your age, menstrual history, and symptoms. He or she will do a physical exam and note any changes in your body, especially your female organs. Female hormone tests may or may not be helpful depending on the amount of female hormones you produce and when you produce them. However, other hormone tests may be helpful to rule out other problems. How is this treated? In some cases, no treatment is needed. The decision on whether treatment is necessary during the perimenopause should be made by you and your health care  provider based on how the symptoms are affecting you and your lifestyle. Various treatments are available, such as:  Treating individual symptoms with a specific medicine for that symptom.  Herbal medicines that can help specific symptoms.  Counseling.  Group therapy. Follow these instructions at home:  Keep track of your menstrual periods (when they occur, how heavy they are, how long between periods, and how long they last) as well as your symptoms and when they started.  Only take over-the-counter or prescription medicines as directed by your health care provider.  Sleep and rest.  Exercise.  Eat a diet that contains calcium (good for your bones) and soy (acts like the estrogen hormone).  Do not smoke.  Avoid alcoholic beverages.  Take vitamin supplements as recommended by your health care provider. Taking vitamin E may help in certain cases.  Take calcium and vitamin D supplements to help prevent bone loss.  Group therapy is sometimes helpful.  Acupuncture may help in some cases. Contact a health care provider if:  You have questions about any symptoms you are having.  You need a referral to a specialist (gynecologist, psychiatrist, or psychologist). Get help right away if:  You have vaginal bleeding.  Your period lasts longer than 8 days.  Your periods are recurring sooner than 21 days.  You have bleeding after intercourse.  You have severe depression.  You have pain when you urinate.  You have severe headaches.  You have vision problems. This information is not intended to replace advice given to you by your health care provider. Make sure you discuss any questions you have with your health care provider. Document Released: 11/29/2004 Document Revised: 03/29/2016 Document Reviewed: 05/21/2013 Elsevier Interactive   Patient Education  2017 [employer]  

## 2018-03-13 ENCOUNTER — Other Ambulatory Visit: Payer: Self-pay | Admitting: Internal Medicine

## 2018-03-13 DIAGNOSIS — N924 Excessive bleeding in the premenopausal period: Secondary | ICD-10-CM

## 2018-03-13 LAB — CBC
HEMATOCRIT: 39.7 % (ref 34.0–46.6)
HEMOGLOBIN: 13.6 g/dL (ref 11.1–15.9)
MCH: 30.2 pg (ref 26.6–33.0)
MCHC: 34.3 g/dL (ref 31.5–35.7)
MCV: 88 fL (ref 79–97)
Platelets: 323 10*3/uL (ref 150–379)
RBC: 4.51 x10E6/uL (ref 3.77–5.28)
RDW: 13.9 % (ref 12.3–15.4)
WBC: 4.6 10*3/uL (ref 3.4–10.8)

## 2018-03-13 LAB — COMPREHENSIVE METABOLIC PANEL
A/G RATIO: 1.5 (ref 1.2–2.2)
ALBUMIN: 4.4 g/dL (ref 3.5–5.5)
ALT: 12 IU/L (ref 0–32)
AST: 15 IU/L (ref 0–40)
Alkaline Phosphatase: 71 IU/L (ref 39–117)
BUN / CREAT RATIO: 12 (ref 9–23)
BUN: 9 mg/dL (ref 6–24)
CALCIUM: 9.8 mg/dL (ref 8.7–10.2)
CHLORIDE: 94 mmol/L — AB (ref 96–106)
CO2: 31 mmol/L — ABNORMAL HIGH (ref 20–29)
Creatinine, Ser: 0.76 mg/dL (ref 0.57–1.00)
GFR, EST AFRICAN AMERICAN: 104 mL/min/{1.73_m2} (ref >59)
GFR, EST NON AFRICAN AMERICAN: 90 mL/min/{1.73_m2} (ref >59)
GLOBULIN, TOTAL: 3 g/dL (ref 1.5–4.5)
Glucose: 91 mg/dL (ref 65–99)
POTASSIUM: 3.5 mmol/L (ref 3.5–5.2)
SODIUM: 141 mmol/L (ref 134–144)
Total Protein: 7.4 g/dL (ref 6.0–8.5)

## 2018-03-13 LAB — FOLLICLE STIMULATING HORMONE: FSH: 49.6 m[IU]/mL

## 2018-03-13 LAB — LUTEINIZING HORMONE: LH: 24.6 m[IU]/mL

## 2018-03-14 ENCOUNTER — Telehealth: Payer: Self-pay

## 2018-03-14 NOTE — Telephone Encounter (Signed)
Contacted pt to go over lab results pt didn't answer lvm asking pt to give me a call at her earliest convenience  

## 2018-03-17 ENCOUNTER — Telehealth: Payer: Self-pay | Admitting: Internal Medicine

## 2018-03-17 DIAGNOSIS — M549 Dorsalgia, unspecified: Principal | ICD-10-CM

## 2018-03-17 DIAGNOSIS — G8929 Other chronic pain: Secondary | ICD-10-CM

## 2018-03-17 NOTE — Telephone Encounter (Signed)
Returned pt to go over lab results.  Dr. Laural Benes pt only concern is when she wipes she is on her period and that is the only time. Pt also states that her body is sore. Pt is wanting to know what to do?

## 2018-03-17 NOTE — Telephone Encounter (Signed)
Patient called to returns pcp nurse call.

## 2018-03-17 NOTE — Telephone Encounter (Signed)
Patient returned nurse call regarding lab results. Please f/u with patient.

## 2018-03-18 NOTE — Telephone Encounter (Signed)
Contacted pt to go over Dr. Laural Benes response. Pt states she understands    Dr. Laural Benes pt is requesting the rx to be sent to [facility]

## 2018-03-21 MED ORDER — NAPROXEN 500 MG PO TABS: 500.0000 mg | ORAL_TABLET | Freq: Two times a day (BID) | ORAL | 0 refills | Status: DC | PRN

## 2018-03-21 MED FILL — ?NAPROXEN 500MG TABLET: 500 | 30 days supply | Qty: 60 | Fill #0

## 2018-03-27 DIAGNOSIS — G894 Chronic pain syndrome: Secondary | ICD-10-CM | POA: Diagnosis not present

## 2018-03-27 DIAGNOSIS — M7551 Bursitis of right shoulder: Secondary | ICD-10-CM | POA: Diagnosis not present

## 2018-03-27 DIAGNOSIS — M47812 Spondylosis without myelopathy or radiculopathy, cervical region: Secondary | ICD-10-CM | POA: Diagnosis not present

## 2018-03-27 DIAGNOSIS — G90511 Complex regional pain syndrome I of right upper limb: Secondary | ICD-10-CM | POA: Diagnosis not present

## 2018-04-03 DIAGNOSIS — N950 Postmenopausal bleeding: Secondary | ICD-10-CM | POA: Diagnosis not present

## 2018-04-03 DIAGNOSIS — Z124 Encounter for screening for malignant neoplasm of cervix: Secondary | ICD-10-CM | POA: Diagnosis not present

## 2018-04-07 DIAGNOSIS — N950 Postmenopausal bleeding: Secondary | ICD-10-CM | POA: Diagnosis not present

## 2018-04-08 ENCOUNTER — Other Ambulatory Visit: Payer: Self-pay | Admitting: Internal Medicine

## 2018-04-08 DIAGNOSIS — G8929 Other chronic pain: Secondary | ICD-10-CM

## 2018-04-08 DIAGNOSIS — M549 Dorsalgia, unspecified: Principal | ICD-10-CM

## 2018-04-08 MED FILL — AMLODIPINE BESYLATE 10 MG T: 10 | 30 days supply | Qty: 30 | Fill #5

## 2018-04-08 MED FILL — ?ESOMEPRAZOLE MAG DR 20MG C: 20 | 30 days supply | Qty: 30 | Fill #5

## 2018-04-08 MED FILL — POTASSIUM CL ER 10 MEQ TAB: 10 | 30 days supply | Qty: 30 | Fill #1

## 2018-04-08 MED FILL — ?CHLORTHALIDONE 25 MG TABLE: 25 | 30 days supply | Qty: 30 | Fill #5

## 2018-04-09 MED FILL — $Linzess 290mcg Capsule: 290 | 30 days supply | Qty: 30 | Fill #0

## 2018-04-24 DIAGNOSIS — N950 Postmenopausal bleeding: Secondary | ICD-10-CM | POA: Diagnosis not present

## 2018-05-02 ENCOUNTER — Other Ambulatory Visit: Payer: Self-pay

## 2018-05-02 DIAGNOSIS — K219 Gastro-esophageal reflux disease without esophagitis: Secondary | ICD-10-CM

## 2018-05-02 MED FILL — CHLORTHALIDONE 25 MG TAB: 25 | 30 days supply | Qty: 30 | Fill #2

## 2018-05-02 MED FILL — POTASSIUM CL ER 10 MEQ TAB: 10 | 30 days supply | Qty: 30 | Fill #2

## 2018-05-02 MED FILL — AMLODIPINE BESYLATE 10 MG T: 10 | 30 days supply | Qty: 30 | Fill #1

## 2018-05-04 MED ORDER — ESOMEPRAZOLE MAGNESIUM 20 MG PO CPDR: 20.0000 mg | DELAYED_RELEASE_CAPSULE | Freq: Every day | ORAL | 5 refills | Status: DC

## 2018-05-05 MED FILL — ESOMEPRAZOLE MAGNESIUM 20 M: 20 | 30 days supply | Qty: 30 | Fill #0

## 2018-05-22 DIAGNOSIS — M47812 Spondylosis without myelopathy or radiculopathy, cervical region: Secondary | ICD-10-CM | POA: Diagnosis not present

## 2018-05-22 DIAGNOSIS — M7551 Bursitis of right shoulder: Secondary | ICD-10-CM | POA: Diagnosis not present

## 2018-05-22 DIAGNOSIS — G90511 Complex regional pain syndrome I of right upper limb: Secondary | ICD-10-CM | POA: Diagnosis not present

## 2018-05-22 DIAGNOSIS — G894 Chronic pain syndrome: Secondary | ICD-10-CM | POA: Diagnosis not present

## 2018-05-26 ENCOUNTER — Other Ambulatory Visit: Payer: Self-pay

## 2018-05-26 DIAGNOSIS — I10 Essential (primary) hypertension: Secondary | ICD-10-CM

## 2018-05-26 MED ORDER — CHLORTHALIDONE 25 MG PO TABS: 25.0000 mg | ORAL_TABLET | Freq: Every day | ORAL | 0 refills | Status: DC

## 2018-05-26 MED FILL — AMLODIPINE BESYLATE 10 MG T: 10 | 30 days supply | Qty: 30 | Fill #2

## 2018-05-26 MED FILL — $Linzess 290mcg Capsule: 290 | 90 days supply | Qty: 90 | Fill #1

## 2018-05-26 MED FILL — ESOMEPRAZOLE MAGNESIUM 20 M: 20 | 30 days supply | Qty: 30 | Fill #1

## 2018-05-26 MED FILL — POTASSIUM CL ER 10 MEQ TAB: 10 | 30 days supply | Qty: 30 | Fill #3

## 2018-05-27 ENCOUNTER — Ambulatory Visit: Payer: Medicare Other | Attending: Internal Medicine

## 2018-05-30 ENCOUNTER — Telehealth: Payer: Self-pay

## 2018-05-30 NOTE — Telephone Encounter (Signed)
Referral for legal aide assistance with housing, food stamps, unemployment  faxed to Diane GosselinMaria Ramirez Dawson, [employer] [facility] [place]

## 2018-06-09 MED FILL — FLUCONAZOLE 150 MG TABS: 150 | 3 days supply | Qty: 1 | Fill #0

## 2018-06-11 DIAGNOSIS — N390 Urinary tract infection, site not specified: Secondary | ICD-10-CM | POA: Diagnosis not present

## 2018-06-11 DIAGNOSIS — N898 Other specified noninflammatory disorders of vagina: Secondary | ICD-10-CM | POA: Diagnosis not present

## 2018-06-11 MED FILL — NITROFURANTOIN MONO-MCR 100: 100 | 5 days supply | Qty: 10 | Fill #0

## 2018-06-12 ENCOUNTER — Encounter: Payer: Self-pay | Admitting: Internal Medicine

## 2018-06-12 ENCOUNTER — Ambulatory Visit: Payer: Medicare Other | Attending: Internal Medicine | Admitting: Internal Medicine

## 2018-06-12 VITALS — BP 113/78 | HR 84 | Temp 98.3°F | Resp 16 | Wt 151.2 lb

## 2018-06-12 DIAGNOSIS — Z79899 Other long term (current) drug therapy: Secondary | ICD-10-CM | POA: Diagnosis not present

## 2018-06-12 DIAGNOSIS — Z882 Allergy status to sulfonamides status: Secondary | ICD-10-CM | POA: Diagnosis not present

## 2018-06-12 DIAGNOSIS — Z880 Allergy status to penicillin: Secondary | ICD-10-CM | POA: Insufficient documentation

## 2018-06-12 DIAGNOSIS — I10 Essential (primary) hypertension: Secondary | ICD-10-CM | POA: Insufficient documentation

## 2018-06-12 DIAGNOSIS — R42 Dizziness and giddiness: Secondary | ICD-10-CM | POA: Insufficient documentation

## 2018-06-12 DIAGNOSIS — K219 Gastro-esophageal reflux disease without esophagitis: Secondary | ICD-10-CM | POA: Insufficient documentation

## 2018-06-12 DIAGNOSIS — K605 Anorectal fistula: Secondary | ICD-10-CM | POA: Diagnosis not present

## 2018-06-12 DIAGNOSIS — Z888 Allergy status to other drugs, medicaments and biological substances status: Secondary | ICD-10-CM | POA: Insufficient documentation

## 2018-06-12 DIAGNOSIS — Z886 Allergy status to analgesic agent status: Secondary | ICD-10-CM | POA: Diagnosis not present

## 2018-06-12 DIAGNOSIS — R0982 Postnasal drip: Secondary | ICD-10-CM | POA: Diagnosis not present

## 2018-06-12 DIAGNOSIS — Z885 Allergy status to narcotic agent status: Secondary | ICD-10-CM | POA: Insufficient documentation

## 2018-06-12 DIAGNOSIS — K5909 Other constipation: Secondary | ICD-10-CM | POA: Diagnosis not present

## 2018-06-12 DIAGNOSIS — R05 Cough: Secondary | ICD-10-CM | POA: Diagnosis not present

## 2018-06-12 DIAGNOSIS — Z8249 Family history of ischemic heart disease and other diseases of the circulatory system: Secondary | ICD-10-CM | POA: Diagnosis not present

## 2018-06-12 DIAGNOSIS — G5601 Carpal tunnel syndrome, right upper limb: Secondary | ICD-10-CM | POA: Insufficient documentation

## 2018-06-12 DIAGNOSIS — R059 Cough, unspecified: Secondary | ICD-10-CM

## 2018-06-12 MED ORDER — MOMETASONE FUROATE 50 MCG/ACT NA SUSP: 2.0000 | Freq: Every day | NASAL | 12 refills | Status: DC

## 2018-06-12 MED ORDER — MOMETASONE FUROATE 50 MCG/ACT NA SUSP: 1.0000 | Freq: Every day | NASAL | 12 refills | Status: DC

## 2018-06-12 MED ORDER — ESOMEPRAZOLE MAGNESIUM 20 MG PO CPDR: 20.0000 mg | DELAYED_RELEASE_CAPSULE | Freq: Two times a day (BID) | ORAL | 5 refills | Status: DC

## 2018-06-12 MED FILL — ?ESOMEPRAZOLE MAG DR 20MG C: 20 | 30 days supply | Qty: 60 | Fill #0

## 2018-06-12 MED FILL — MOMETASONE FUROATE 50 MCG S: 50 | 30 days supply | Qty: 17 | Fill #0

## 2018-06-12 MED FILL — ?CHLORTHALIDONE 25 MG TABLE: 25 | 30 days supply | Qty: 30 | Fill #0

## 2018-06-12 NOTE — Patient Instructions (Addendum)
Increase Nexium to 20 mg twice a day.  Use the Nasonex nasal spray - one spray in each nostril daily at bedtime. Follow up if cough does not improve or resolves.    Please sign a release for me to get your PAP smear and mammobram report from Dr. Arlyce DiceKaplan.

## 2018-06-12 NOTE — Progress Notes (Signed)
Patient ID: [name], female    DOB: October 20, 1966  MRN: [id number]  CC: Hypertension   Subjective: [name] is a 52 y.o. female who presents for chronic ds management Her concerns today include:  Pt with hx of HTN, chronic constipation, anal fistulas, vertigo and GERD  HTN:  Compliant with meds - Norvasc and Chlorthalidone and K+ No  Cymbalta on med list.Also on Oxycodone 5 mg TID PRN.  Prescribed by her pain specialist Dr. Wilhelmenia Blase of [facility] for CTS.  Seen on monthly bases.  GERD:  Doing well on Nexium  C/o dry cough at nights x 2-3 wks.  Endorses wheezing, drainage at back of throat, burps at nights.  Takes Nexium and claritin daily. -stopped using Flonase because it causes HA  HM:  Has MMG schedule for 06/18/2018.   Patient Active Problem List   Diagnosis Date Noted  . Carpal tunnel syndrome of right wrist 06/12/2018  . Postnasal drip 06/12/2018  . Vertigo 10/18/2016  . Right arm pain 04/19/2014  . Disturbance of skin sensation 04/19/2014  . Anal fistula 04/14/2013  . UNSPECIFIED SITE OF SPRAIN AND STRAIN 07/07/2009  . PEDAL EDEMA 06/29/2009  . MUSCLE PAIN 06/23/2009  . 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 ALLERGIC RHINITIS 03/11/2009  . SYNCOPE, VASOVAGAL 03/11/2009  . HEADACHE 03/11/2009     Current Outpatient Medications on File Prior to Visit  Medication Sig Dispense Refill  . amLODipine (NORVASC) 10 MG tablet Take 1 tablet (10 mg total) by mouth daily. 30 tablet 5  . chlorthalidone (HYGROTON) 25 MG tablet Take 1 tablet (25 mg total) by mouth daily. 30 tablet 0  . DULoxetine (CYMBALTA) 30 MG capsule Take 1 capsule by mouth at bedtime.    Marland Kitchen LINZESS 290 MCG CAPS capsule TAKE 1 CAPSULE BY MOUTH DAILY BEFORE BREAKFAST. 30 capsule 3  . loratadine (CLARITIN) 10 MG tablet Take 10 mg by mouth daily.    . naproxen (NAPROSYN) 500 MG tablet Take 1 tablet (500 mg total) by mouth 2 (two) times daily as needed. 60 tablet 0  . oxycodone (OXY-IR) 5 MG  capsule Take 5 mg by mouth every 8 (eight) hours as needed.    . potassium chloride (K-DUR) 10 MEQ tablet TAKE 1 CAPSULE BY MOUTH DAILY. 90 tablet 1   No current facility-administered medications on file prior to visit.     Allergies  Allergen Reactions  . Ampicillin Anaphylaxis  . Penicillins Anaphylaxis  . Sulfonamide Derivatives Anaphylaxis  . Hydrocodone-Acetaminophen Hives    Have to take a Benadryl to take med  . Tramadol Itching  . Tylenol [Acetaminophen] Itching    Social History   Socioeconomic History  . Marital status: Single    Spouse name: Not on file  . Number of children: 2  . Years of education: 10  . Highest education level: Not on file  Occupational History  . Occupation: N/A  Social Needs  . Financial resource strain: Not on file  . Food insecurity:    Worry: Not on file    Inability: Not on file  . Transportation needs:    Medical: Not on file    Non-medical: Not on file  Tobacco Use  . Smoking status: Never Smoker  . Smokeless tobacco: Never Used  Substance and Sexual Activity  . Alcohol use: Yes    Alcohol/week: 0.0 standard drinks    Comment: occa  . Drug use: No  . Sexual activity: Yes    Birth control/protection: Pill  Lifestyle  . Physical activity:    Days per week: Not on file    Minutes per session: Not on file  . Stress: Not on file  Relationships  . Social connections:    Talks on phone: Not on file    Gets together: Not on file    Attends religious service: Not on file    Active member of club or organization: Not on file    Attends meetings of clubs or organizations: Not on file    Relationship status: Not on file  . Intimate partner violence:    Fear of current or ex partner: Not on file    Emotionally abused: Not on file    Physically abused: Not on file    Forced sexual activity: Not on file  Other Topics Concern  . Not on file  Social History Narrative   Lives at home w/ her mother   Right-handed   Caffeine: 1  cup of tea per day    Family History  Problem Relation Age of Onset  . Diabetes Mother   . Heart attack Father   . Diabetes Brother   . Diabetes Maternal Uncle   . Diabetes Maternal Aunt   . Seizures Sister   . Colon cancer Neg Hx   . Stomach cancer Neg Hx     Past Surgical History:  Procedure Laterality Date  . ANAL FISTULECTOMY  08/22/2012   Procedure: FISTULECTOMY ANAL;  Surgeon: Lodema PilotBrian Layton, DO;  Location: [facility];  Service: General;  Laterality: N/A;  rectal examination under anesthesia with seton placement   . ANAL FISTULECTOMY  09/17/2012   Procedure: FISTULECTOMY ANAL;  Surgeon: Lodema PilotBrian Layton, DO;  Location: [facility];  Service: General;  Laterality: N/A;  possible fistulotomy  . D & C HYSTEROSCOPY/ NOVASURE ENDOMETRIAL ABLATION/ I & D THROMBOSED HEMORROID  01-10-2007  DR Miguel AschoffALLAN ROSS  . EXAMINATION UNDER ANESTHESIA  09/17/2012   Procedure: EXAM UNDER ANESTHESIA;  Surgeon: Lodema PilotBrian Layton, DO;  Location: [facility];  Service: General;  Laterality: N/A;  Rectal exam under anesthesia  . EXAMINATION UNDER ANESTHESIA N/A 05/14/2013   Procedure: EXAM UNDER ANESTHESIA;  Surgeon: Romie LeveeAlicia Thomas, MD;  [facility] [place];  Service: General;  Laterality: N/A;  . FISTULA PLUG  09/17/2012   Procedure: FISTULA PLUG;  Surgeon: Lodema PilotBrian Layton, DO;  Location: [facility];  Service: General;  Laterality: N/A;  Possible fistula plug  . HEMORRHOID SURGERY  04-28-2009   [facility] INTERNAL HEMORRHOIDS  . IRRIGATION AND DEBRIDEMENT ABSCESS  07/08/2012   Procedure: MINOR INCISION AND DRAINAGE OF ABSCESS;  Surgeon: Geryl RankinsEvelyn Varnado, MD;  [facility] [place];  Service: Gynecology;  Laterality: N/A;  Vulvar Abscess  . PLACEMENT OF SETON N/A 05/14/2013   Procedure:  PLACEMENT OF SETON;  Surgeon: Romie LeveeAlicia Thomas, MD;  [facility] [place];  Service: General;  Laterality: N/A;  . RECTAL EXAM UNDER ANESTHESIA N/A 08/20/2013    Procedure: mucosal advancement flap ;  Surgeon: Romie LeveeAlicia Thomas, MD;  Location: [facility];  Service: General;  Laterality: N/A;  parks anal retractor long rectal instrucments prone jack knife anal fistula   . RECTAL ULTRASOUND N/A 07/20/2013   Procedure: RECTAL ULTRASOUND;  Surgeon: Romie LeveeAlicia Thomas, MD;  Location: [facility];  Service: Endoscopy;  Laterality: N/A;    ROS: Review of Systems Neg except as above PHYSICAL EXAM: BP 113/78   Pulse 84   Temp 98.3 F (36.8 C) (Oral)   Resp 16  Wt 151 lb 3.2 oz (68.6 kg)   SpO2 97%   BMI 25.95 kg/m   Physical Exam  General appearance - alert, well appearing, and in no distress Mental status - normal mood, behavior, speech, dress, motor activity, and thought processes Nose: no enlargement of nasal turbinates Throat: clear Neck - supple, no significant adenopathy Chest - clear to auscultation, no wheezes, rales or rhonchi, symmetric air entry Heart - normal rate, regular rhythm, normal S1, S2, no murmurs, rubs, clicks or gallops Extremities - peripheral pulses normal, no pedal edema, no clubbing or cyanosis   ASSESSMENT AND PLAN: 1. Essential hypertension At goal. Continue current meds  2. Gastroesophageal reflux disease, esophagitis presence not specified - esomeprazole (NEXIUM) 20 MG capsule; Take 1 capsule (20 mg total) by mouth 2 (two) times daily before a meal.  Dispense: 60 capsule; Refill: 5  3. Carpal tunnel syndrome of right wrist Followed by pain specialist  4. Cough Component of postnasal drip and GERD.  Recommend increase Nexium to BID. Gerd precautions discussed.  Change Flonase to Nasonex to see if he tolerates better  5. Postnasal drip - mometasone (NASONEX) 50 MCG/ACT nasal spray; Place 1 spray into the nose daily.  Dispense: 17 g; Refill: 12   Patient was given the opportunity to ask questions.  Patient verbalized understanding of the plan and was able to repeat key elements of the plan.   No orders of the defined  types were placed in this encounter.    Requested Prescriptions   Signed Prescriptions Disp Refills  . esomeprazole (NEXIUM) 20 MG capsule 60 capsule 5    Sig: Take 1 capsule (20 mg total) by mouth 2 (two) times daily before a meal.  . mometasone (NASONEX) 50 MCG/ACT nasal spray 17 g 12    Sig: Place 1 spray into the nose daily.    Return in about 4 months (around 10/12/2018).  Jonah Blue, MD, FACP

## 2018-07-03 DIAGNOSIS — G894 Chronic pain syndrome: Secondary | ICD-10-CM | POA: Diagnosis not present

## 2018-07-03 DIAGNOSIS — M47812 Spondylosis without myelopathy or radiculopathy, cervical region: Secondary | ICD-10-CM | POA: Diagnosis not present

## 2018-07-03 DIAGNOSIS — M7551 Bursitis of right shoulder: Secondary | ICD-10-CM | POA: Diagnosis not present

## 2018-07-03 DIAGNOSIS — G90511 Complex regional pain syndrome I of right upper limb: Secondary | ICD-10-CM | POA: Diagnosis not present

## 2018-07-04 ENCOUNTER — Other Ambulatory Visit: Payer: Self-pay | Admitting: Internal Medicine

## 2018-07-04 DIAGNOSIS — I10 Essential (primary) hypertension: Secondary | ICD-10-CM

## 2018-07-04 MED FILL — POTASSIUM CL 10 MEQ TAB SA: 10 | 30 days supply | Qty: 30 | Fill #4

## 2018-07-08 ENCOUNTER — Other Ambulatory Visit: Payer: Self-pay

## 2018-07-08 DIAGNOSIS — I10 Essential (primary) hypertension: Secondary | ICD-10-CM

## 2018-07-08 MED ORDER — AMLODIPINE BESYLATE 10 MG PO TABS: 10.0000 mg | ORAL_TABLET | Freq: Every day | ORAL | 2 refills | Status: DC

## 2018-07-08 MED FILL — ?CHLORTHALIDONE 25 MG TABLE: 25 | 30 days supply | Qty: 30 | Fill #0

## 2018-07-08 MED FILL — AMLODIPINE BESYLATE 10 MG T: 10 | 30 days supply | Qty: 30 | Fill #0

## 2018-07-08 MED FILL — ?ESOMEPRAZOLE MAG DR 20MG C: 20 | 30 days supply | Qty: 60 | Fill #1

## 2018-07-24 DIAGNOSIS — Z23 Encounter for immunization: Secondary | ICD-10-CM | POA: Diagnosis not present

## 2018-07-30 ENCOUNTER — Telehealth: Payer: Self-pay

## 2018-07-30 NOTE — Telephone Encounter (Signed)
As per Ranee Gosselin, [facility] [place], they have offered help to the patient and have now closed the case.

## 2018-08-08 MED FILL — POTASSIUM CL ER 10 MEQ TAB: 10 | 30 days supply | Qty: 30 | Fill #5

## 2018-08-08 MED FILL — rOPINIRole HCL 1 MG TABS: 1 | 30 days supply | Qty: 60 | Fill #0

## 2018-08-08 MED FILL — AMLODIPINE BESYLATE 10 MG T: 10 | 30 days supply | Qty: 30 | Fill #1

## 2018-08-08 MED FILL — ?DULoxetine HCL 60MG CPEP: 60 | 30 days supply | Qty: 30 | Fill #0

## 2018-08-08 MED FILL — ?CHLORTHALIDONE 25 MG TABLE: 25 | 30 days supply | Qty: 30 | Fill #1

## 2018-08-11 DIAGNOSIS — G90511 Complex regional pain syndrome I of right upper limb: Secondary | ICD-10-CM | POA: Diagnosis not present

## 2018-08-11 DIAGNOSIS — M47812 Spondylosis without myelopathy or radiculopathy, cervical region: Secondary | ICD-10-CM | POA: Diagnosis not present

## 2018-08-11 DIAGNOSIS — G894 Chronic pain syndrome: Secondary | ICD-10-CM | POA: Diagnosis not present

## 2018-08-11 DIAGNOSIS — M7551 Bursitis of right shoulder: Secondary | ICD-10-CM | POA: Diagnosis not present

## 2018-08-11 MED FILL — ?ROPINIROLE HCL 2 MG TABLET: 2 | 30 days supply | Qty: 30 | Fill #0

## 2018-08-26 DIAGNOSIS — M5416 Radiculopathy, lumbar region: Secondary | ICD-10-CM | POA: Diagnosis not present

## 2018-09-09 ENCOUNTER — Other Ambulatory Visit: Payer: Self-pay | Admitting: Internal Medicine

## 2018-09-09 DIAGNOSIS — G90511 Complex regional pain syndrome I of right upper limb: Secondary | ICD-10-CM | POA: Diagnosis not present

## 2018-09-09 DIAGNOSIS — Z79891 Long term (current) use of opiate analgesic: Secondary | ICD-10-CM | POA: Diagnosis not present

## 2018-09-09 DIAGNOSIS — E876 Hypokalemia: Secondary | ICD-10-CM

## 2018-09-09 DIAGNOSIS — G894 Chronic pain syndrome: Secondary | ICD-10-CM | POA: Diagnosis not present

## 2018-09-09 DIAGNOSIS — M7551 Bursitis of right shoulder: Secondary | ICD-10-CM | POA: Diagnosis not present

## 2018-09-09 DIAGNOSIS — M47812 Spondylosis without myelopathy or radiculopathy, cervical region: Secondary | ICD-10-CM | POA: Diagnosis not present

## 2018-09-09 MED FILL — AMLODIPINE BESYLATE 10 MG T: 10 | 30 days supply | Qty: 30 | Fill #2

## 2018-09-09 MED FILL — ?DULoxetine HCL 60MG CPEP: 60 | 30 days supply | Qty: 30 | Fill #0

## 2018-09-09 MED FILL — POTASSIUM CHLORIDE ER 10 ME: 10 | 30 days supply | Qty: 30 | Fill #0

## 2018-09-09 MED FILL — ?ESOMEPRAZOLE MAG DR 20MG C: 20 | 30 days supply | Qty: 60 | Fill #2

## 2018-09-09 MED FILL — ?DULoxetine HCL 60MG CPEP: 60 | 30 days supply | Qty: 30 | Fill #1

## 2018-09-09 MED FILL — ?CHLORTHALIDONE 25 MG TABLE: 25 | 30 days supply | Qty: 30 | Fill #2

## 2018-09-09 MED FILL — ?ROPINIROLE HCL 2 MG TABLET: 2 | 30 days supply | Qty: 30 | Fill #1

## 2018-09-21 DIAGNOSIS — L259 Unspecified contact dermatitis, unspecified cause: Secondary | ICD-10-CM | POA: Diagnosis not present

## 2018-10-08 DIAGNOSIS — M47812 Spondylosis without myelopathy or radiculopathy, cervical region: Secondary | ICD-10-CM | POA: Diagnosis not present

## 2018-10-08 DIAGNOSIS — G894 Chronic pain syndrome: Secondary | ICD-10-CM | POA: Diagnosis not present

## 2018-10-08 DIAGNOSIS — G90511 Complex regional pain syndrome I of right upper limb: Secondary | ICD-10-CM | POA: Diagnosis not present

## 2018-10-08 DIAGNOSIS — K5903 Drug induced constipation: Secondary | ICD-10-CM | POA: Diagnosis not present

## 2018-10-08 MED FILL — POTASSIUM CHLORIDE ER 10 ME: 10 | 30 days supply | Qty: 30 | Fill #1

## 2018-10-08 MED FILL — ?ROPINIROLE HCL 2 MG TABLET: 2 | 30 days supply | Qty: 30 | Fill #0

## 2018-10-08 MED FILL — ?ESOMEPRAZOLE MAG DR 20MG C: 20 | 30 days supply | Qty: 60 | Fill #3

## 2018-10-10 ENCOUNTER — Other Ambulatory Visit: Payer: Self-pay

## 2018-10-10 DIAGNOSIS — I10 Essential (primary) hypertension: Secondary | ICD-10-CM

## 2018-10-10 MED ORDER — AMLODIPINE BESYLATE 10 MG PO TABS: 10.0000 mg | ORAL_TABLET | Freq: Every day | ORAL | 2 refills | Status: DC

## 2018-10-10 MED ORDER — CHLORTHALIDONE 25 MG PO TABS: 25.0000 mg | ORAL_TABLET | Freq: Every day | ORAL | 2 refills | Status: DC

## 2018-10-10 MED FILL — ?CHLORTHALIDONE 25 MG TABLE: 25 | 30 days supply | Qty: 30 | Fill #0

## 2018-10-10 MED FILL — AMLODIPINE BESYLATE 10 MG T: 10 | 30 days supply | Qty: 30 | Fill #0

## 2018-10-13 ENCOUNTER — Encounter: Payer: Self-pay | Admitting: Internal Medicine

## 2018-10-13 ENCOUNTER — Ambulatory Visit: Payer: Medicare Other | Attending: Internal Medicine | Admitting: Internal Medicine

## 2018-10-13 VITALS — Temp 98.7°F | Resp 16 | Wt 154.6 lb

## 2018-10-13 DIAGNOSIS — K5909 Other constipation: Secondary | ICD-10-CM | POA: Insufficient documentation

## 2018-10-13 DIAGNOSIS — E782 Mixed hyperlipidemia: Secondary | ICD-10-CM | POA: Insufficient documentation

## 2018-10-13 DIAGNOSIS — M7062 Trochanteric bursitis, left hip: Secondary | ICD-10-CM | POA: Diagnosis not present

## 2018-10-13 DIAGNOSIS — I10 Essential (primary) hypertension: Secondary | ICD-10-CM

## 2018-10-13 DIAGNOSIS — K219 Gastro-esophageal reflux disease without esophagitis: Secondary | ICD-10-CM | POA: Insufficient documentation

## 2018-10-13 DIAGNOSIS — Z79899 Other long term (current) drug therapy: Secondary | ICD-10-CM | POA: Diagnosis not present

## 2018-10-13 DIAGNOSIS — G8929 Other chronic pain: Secondary | ICD-10-CM | POA: Insufficient documentation

## 2018-10-13 DIAGNOSIS — M545 Low back pain: Secondary | ICD-10-CM | POA: Insufficient documentation

## 2018-10-13 DIAGNOSIS — Z23 Encounter for immunization: Secondary | ICD-10-CM | POA: Diagnosis not present

## 2018-10-13 DIAGNOSIS — Z1239 Encounter for other screening for malignant neoplasm of breast: Secondary | ICD-10-CM

## 2018-10-13 DIAGNOSIS — Z8739 Personal history of other diseases of the musculoskeletal system and connective tissue: Secondary | ICD-10-CM

## 2018-10-13 MED ORDER — TETANUS-DIPHTH-ACELL PERTUSSIS 5-2.5-18.5 LF-MCG/0.5 IM SUSP: 0.5000 mL | INTRAMUSCULAR | 0 refills | Status: DC

## 2018-10-13 MED ORDER — NAPROXEN 500 MG PO TABS: 500.0000 mg | ORAL_TABLET | Freq: Two times a day (BID) | ORAL | 0 refills | Status: DC | PRN

## 2018-10-13 MED ORDER — KETOROLAC TROMETHAMINE 30 MG/ML IJ SOLN
30.0000 mg | Freq: Once | INTRAMUSCULAR | Status: AC
  Administered 2018-10-13: 30 mg via INTRAMUSCULAR

## 2018-10-13 MED FILL — NAPROXEN 500 MG TABLET: 500 | 30 days supply | Qty: 60 | Fill #0

## 2018-10-13 NOTE — Patient Instructions (Addendum)
Please sign a release at the front desk for me to get a copy of your Pap smear report from your gynecologist.  Do not forget to come back to the lab fasting at your earliest convenience to have blood work done.  Hold off on using the diclofenac's gel.  Take the Naprosyn twice a day as needed for the left hip pain.  Be sure to take the medication with food.  Trochanteric Bursitis Trochanteric bursitis is a condition that causes hip pain. Trochanteric bursitis happens when fluid-filled sacs (bursae) in the hip get irritated. Normally these sacs absorb shock and help strong bands of tissue (tendons) in your hip glide smoothly over each other and over your hip bones. What are the causes? This condition results from increased friction between the hip bones and the tendons that go over them. This condition can happen if you:  Have weak hips.  Use your hip muscles too much (overuse).  Get hit in the hip.  What increases the risk? This condition is more likely to develop in:  Women.  Adults who are middle-aged or older.  People with arthritis or a spinal condition.  People with weak buttocks muscles (gluteal muscles).  People who have one leg that is shorter than the other.  People who participate in certain kinds of athletic activities, such as: ? Running sports, especially long-distance running. ? Contact sports, like football or martial arts. ? Sports in which falls may occur, like skiing.  What are the signs or symptoms? The main symptom of this condition is pain and tenderness over the point of your hip. The pain may be:  Sharp and intense.  Dull and achy.  Felt on the outside of your thigh.  It may increase when you:  Lie on your side.  Walk or run.  Go up on stairs.  Sit.  Stand up after sitting.  Stand for long periods of time.  How is this diagnosed? This condition may be diagnosed based on:  Your symptoms.  Your medical history.  A physical  exam.  Imaging tests, such as: ? X-rays to check your bones. ? An MRI or ultrasound to check your tendons and muscles.  During your physical exam, your health care provider will check the movement and strength of your hip. He or she may press on the point of your hip to check for pain. How is this treated? This condition may be treated by:  Resting.  Reducing your activity.  Avoiding activities that cause pain.  Using crutches, a cane, or a walker to decrease the strain on your hip.  Taking medicine to help with swelling.  Having medicine injected into the bursae to help with swelling.  Using ice, heat, and massage therapy for pain relief.  Physical therapy exercises for strength and flexibility.  Surgery (rare).  Follow these instructions at home: Activity  Rest.  Avoid activities that cause pain.  Return to your normal activities as told by your health care provider. Ask your health care provider what activities are safe for you. Managing pain, stiffness, and swelling  Take over-the-counter and prescription medicines only as told by your health care provider.  If directed, apply heat to the injured area as told by your health care provider. ? Place a towel between your skin and the heat source. ? Leave the heat on for 20-30 minutes. ? Remove the heat if your skin turns bright red. This is especially important if you are unable to feel pain, heat, or  cold. You may have a greater risk of getting burned.  If directed, apply ice to the injured area: ? Put ice in a plastic bag. ? Place a towel between your skin and the bag. ? Leave the ice on for 20 minutes, 2-3 times a day. General instructions  If the affected leg is one that you use for driving, ask your health care provider when it is safe to drive.  Use crutches, a cane, or a walker as told by your health care provider.  If one of your legs is shorter than the other, get fitted for a shoe insert.  Lose  weight if you are overweight. How is this prevented?  Wear supportive footwear that is appropriate for your sport.  If you have hip pain, start any new exercise or sport slowly.  Maintain physical fitness, including: ? Strength. ? Flexibility. Contact a health care provider if:  Your pain does not improve with 2-4 weeks. Get help right away if:  You develop severe pain.  You have a fever.  You develop increased redness over your hip.  You have a change in your bowel function or bladder function.  You cannot control the muscles in your feet. This information is not intended to replace advice given to you by your health care provider. Make sure you discuss any questions you have with your health care provider. Document Released: 11/29/2004 Document Revised: 06/27/2016 Document Reviewed: 10/07/2015 Elsevier Interactive Patient Education  2018 Elsevier Inc.  Td Vaccine (Tetanus and Diphtheria): What You Need to Know 1. Why get vaccinated? Tetanus  and diphtheria are very serious diseases. They are rare in the [place] today, but people who do become infected often have severe complications. Td vaccine is used to protect adolescents and adults from both of these diseases. Both tetanus and diphtheria are infections caused by bacteria. Diphtheria spreads from person to person through coughing or sneezing. Tetanus-causing bacteria enter the body through cuts, scratches, or wounds. TETANUS (lockjaw) causes painful muscle tightening and stiffness, usually all over the body. It can lead to tightening of muscles in the head and neck so you can't open your mouth, swallow, or sometimes even breathe. Tetanus kills about 1 out of every 10 people who are infected even after receiving the best medical care.  DIPHTHERIA can cause a thick coating to form in the back of the throat. It can lead to breathing problems, paralysis, heart failure, and death.  Before vaccines, as many as 200,000  cases of diphtheria and hundreds of cases of tetanus were reported in the [place] each year. Since vaccination began, reports of cases for both diseases have dropped by about 99%. 2. Td vaccine Td vaccine can protect adolescents and adults from tetanus and diphtheria. Td is usually given as a booster dose every 10 years but it can also be given earlier after a severe and dirty wound or burn. Another vaccine, called Tdap, which protects against pertussis in addition to tetanus and diphtheria, is sometimes recommended instead of Td vaccine. Your doctor or the person giving you the vaccine can give you more information. Td may safely be given at the same time as other vaccines. 3. Some people should not get this vaccine A person who has ever had a life-threatening allergic reaction after a previous dose of any tetanus or diphtheria containing vaccine, OR has a severe allergy to any part of this vaccine, should not get Td vaccine. Tell the person giving the vaccine about any severe allergies.  Talk to your doctor if you: had severe pain or swelling after any vaccine containing diphtheria or tetanus, ever had a condition called Guillain Barre Syndrome (GBS), aren't feeling well on the day the shot is scheduled. 4. What are the risks from Td vaccine? With any medicine, including vaccines, there is a chance of side effects. These are usually mild and go away on their own. Serious reactions are also possible but are rare. Most people who get Td vaccine do not have any problems with it. Mild problems following Td vaccine: (Did not interfere with activities) Pain where the shot was given (about 8 people in 10) Redness or swelling where the shot was given (about 1 person in 4) Mild fever (rare) Headache (about 1 person in 4) Tiredness (about 1 person in 4)  Moderate problems following Td vaccine: (Interfered with activities, but did not require medical attention) Fever over 102F (rare)  Severe  problems following Td vaccine: (Unable to perform usual activities; required medical attention) Swelling, severe pain, bleeding and/or redness in the arm where the shot was given (rare).  Problems that could happen after any vaccine: People sometimes faint after a medical procedure, including vaccination. Sitting or lying down for about 15 minutes can help prevent fainting, and injuries caused by a fall. Tell your doctor if you feel dizzy, or have vision changes or ringing in the ears. Some people get severe pain in the shoulder and have difficulty moving the arm where a shot was given. This happens very rarely. Any medication can cause a severe allergic reaction. Such reactions from a vaccine are very rare, estimated at fewer than 1 in a million doses, and would happen within a few minutes to a few hours after the vaccination. As with any medicine, there is a very remote chance of a vaccine causing a serious injury or death. The safety of vaccines is always being monitored. For more information, visit: http://floyd.org/ 5. What if there is a serious reaction? What should I look for? Look for anything that concerns you, such as signs of a severe allergic reaction, very high fever, or unusual behavior. Signs of a severe allergic reaction can include hives, swelling of the face and throat, difficulty breathing, a fast heartbeat, dizziness, and weakness. These would usually start a few minutes to a few hours after the vaccination. What should I do? If you think it is a severe allergic reaction or other emergency that can't wait, call 9-1-1 or get the person to the nearest hospital. Otherwise, call your doctor. Afterward, the reaction should be reported to the Vaccine Adverse Event Reporting System (VAERS). Your doctor might file this report, or you can do it yourself through the VAERS web site at www.vaers.LAgents.no, or by calling 1-484-164-0948. VAERS does not give medical advice. 6. The  National Vaccine Injury Compensation Program The [employer] Vaccine Injury Compensation Program (VICP) is a federal program that was created to compensate people who may have been injured by certain vaccines. Persons who believe they may have been injured by a vaccine can learn about the program and about filing a claim by calling 1-970-174-9523 or visiting the VICP website at SpiritualWord.at. There is a time limit to file a claim for compensation. 7. How can I learn more? Ask your doctor. He or she can give you the vaccine package insert or suggest other sources of information. Call your local or state health department. Contact the Centers for Disease Control and Prevention (CDC): Call 218 262 4988 (1-800-CDC-INFO) Visit CDC's website at  http://hunter.com/ CDC Td Vaccine VIS (02/14/16) This information is not intended to replace advice given to you by your health care provider. Make sure you discuss any questions you have with your health care provider. Document Released: 08/19/2006 Document Revised: 07/12/2016 Document Reviewed: 07/12/2016 Elsevier Interactive Patient Education  2017 [employer].

## 2018-10-13 NOTE — Progress Notes (Signed)
Patient ID: [name], female    DOB: Jun 29, 1966  MRN: [id number]  CC: Hypertension   Subjective: [name] is a 52 y.o. female who presents for chronic ds management Her concerns today include:  Pt with hx of HTN, chronic constipation,anal fistulas,vertigo, HL, chronic back pain (followed by pain specialist) and GERD  HM:  Had flu shot at [facility] this fall.  Agreeable to Tdap.  Had pap earlier this yr by her GYN Dr. Arlyce Dice.  She plans to sign a release today for [place] to get copy of her pap results  C/o pain over lateral aspect of LT hip into thigh x 3 days No initialing factors Constant pain Worse with movement and laying on LT side Using Voltaren gel but does not find it helpful Seeing a pain specialist at [facility], Dr. Wilhelmenia Blase, for pain in RT hand, and chronic lower back.  She tells me she has disc problem in lower back.  She has been seeing her for yrs.  She is on Oxycodone 5 mg TID, Voltaren gel and Cymbalta.   HYPERTENSION Currently taking: see medication list Med Adherence: [x]  Yes    []  No Medication side effects: []  Yes    [x]  No Adherence with salt restriction: [x]  Yes    []  No Home Monitoring?: []  Yes    [x]  No SOB? []  Yes    [x]  No Chest Pain?: []  Yes    [x]  No Leg swelling?: []  Yes    [x]  No Headaches?: []  Yes    [x]  No Dizziness? []  Yes    [x]  No Comments: walks almost daily.  Does well with limiting portion sizes.  Eating more fruits and veggies.  Drinks Kool-Aid and water.  She drinks a regular Pepsi about every other day.    Patient Active Problem List   Diagnosis Date Noted  . Carpal tunnel syndrome of right wrist 06/12/2018  . Postnasal drip 06/12/2018  . Vertigo 10/18/2016  . Right arm pain 04/19/2014  . Disturbance of skin sensation 04/19/2014  . Anal fistula 04/14/2013  . UNSPECIFIED SITE OF SPRAIN AND STRAIN 07/07/2009  . PEDAL EDEMA 06/29/2009  . MUSCLE PAIN 06/23/2009  . 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 ALLERGIC  RHINITIS 03/11/2009  . SYNCOPE, VASOVAGAL 03/11/2009  . HEADACHE 03/11/2009     Current Outpatient Medications on File Prior to Visit  Medication Sig Dispense Refill  . amLODipine (NORVASC) 10 MG tablet Take 1 tablet (10 mg total) by mouth daily. 30 tablet 2  . chlorthalidone (HYGROTON) 25 MG tablet Take 1 tablet (25 mg total) by mouth daily. 30 tablet 2  . DULoxetine (CYMBALTA) 30 MG capsule Take 1 capsule by mouth at bedtime.    Marland Kitchen esomeprazole (NEXIUM) 20 MG capsule Take 1 capsule (20 mg total) by mouth 2 (two) times daily before a meal. 60 capsule 5  . LINZESS 290 MCG CAPS capsule TAKE 1 CAPSULE BY MOUTH DAILY BEFORE BREAKFAST. 30 capsule 3  . loratadine (CLARITIN) 10 MG tablet Take 10 mg by mouth daily.    . mometasone (NASONEX) 50 MCG/ACT nasal spray Place 1 spray into the nose daily. 17 g 12  . oxycodone (OXY-IR) 5 MG capsule Take 5 mg by mouth every 8 (eight) hours as needed.    . potassium chloride (K-DUR) 10 MEQ tablet TAKE 1 CAPSULE BY MOUTH DAILY. 90 tablet 1   No current facility-administered medications on file prior to visit.     Allergies  Allergen Reactions  .  Ampicillin Anaphylaxis  . Penicillins Anaphylaxis  . Sulfonamide Derivatives Anaphylaxis  . Hydrocodone-Acetaminophen Hives    Have to take a Benadryl to take med  . Tramadol Itching  . Tylenol [Acetaminophen] Itching    Social History   Socioeconomic History  . Marital status: Single    Spouse name: Not on file  . Number of children: 2  . Years of education: 10  . Highest education level: Not on file  Occupational History  . Occupation: N/A  Social Needs  . Financial resource strain: Not on file  . Food insecurity:    Worry: Not on file    Inability: Not on file  . Transportation needs:    Medical: Not on file    Non-medical: Not on file  Tobacco Use  . Smoking status: Never Smoker  . Smokeless tobacco: Never Used  Substance and Sexual Activity  . Alcohol use: Yes    Alcohol/week: 0.0  standard drinks    Comment: occa  . Drug use: No  . Sexual activity: Yes    Birth control/protection: Pill  Lifestyle  . Physical activity:    Days per week: Not on file    Minutes per session: Not on file  . Stress: Not on file  Relationships  . Social connections:    Talks on phone: Not on file    Gets together: Not on file    Attends religious service: Not on file    Active member of club or organization: Not on file    Attends meetings of clubs or organizations: Not on file    Relationship status: Not on file  . Intimate partner violence:    Fear of current or ex partner: Not on file    Emotionally abused: Not on file    Physically abused: Not on file    Forced sexual activity: Not on file  Other Topics Concern  . Not on file  Social History Narrative   Lives at home w/ her mother   Right-handed   Caffeine: 1 cup of tea per day    Family History  Problem Relation Age of Onset  . Diabetes Mother   . Heart attack Father   . Diabetes Brother   . Diabetes Maternal Uncle   . Diabetes Maternal Aunt   . Seizures Sister   . Colon cancer Neg Hx   . Stomach cancer Neg Hx     Past Surgical History:  Procedure Laterality Date  . ANAL FISTULECTOMY  08/22/2012   Procedure: FISTULECTOMY ANAL;  Surgeon: Lodema PilotBrian Layton, DO;  Location: [facility];  Service: General;  Laterality: N/A;  rectal examination under anesthesia with seton placement   . ANAL FISTULECTOMY  09/17/2012   Procedure: FISTULECTOMY ANAL;  Surgeon: Lodema PilotBrian Layton, DO;  Location: [facility];  Service: General;  Laterality: N/A;  possible fistulotomy  . D & C HYSTEROSCOPY/ NOVASURE ENDOMETRIAL ABLATION/ I & D THROMBOSED HEMORROID  01-10-2007  DR Miguel AschoffALLAN ROSS  . EXAMINATION UNDER ANESTHESIA  09/17/2012   Procedure: EXAM UNDER ANESTHESIA;  Surgeon: Lodema PilotBrian Layton, DO;  Location: [facility];  Service: General;  Laterality: N/A;  Rectal exam under anesthesia  . EXAMINATION UNDER ANESTHESIA N/A  05/14/2013   Procedure: EXAM UNDER ANESTHESIA;  Surgeon: Romie LeveeAlicia Thomas, MD;  [facility];  Service: General;  Laterality: N/A;  . FISTULA PLUG  09/17/2012   Procedure: FISTULA PLUG;  Surgeon: Lodema PilotBrian Layton, DO;  Location: [facility];  Service: General;  Laterality: N/A;  Possible fistula plug  . HEMORRHOID SURGERY  04-28-2009   [facility] INTERNAL HEMORRHOIDS  . IRRIGATION AND DEBRIDEMENT ABSCESS  07/08/2012   Procedure: MINOR INCISION AND DRAINAGE OF ABSCESS;  Surgeon: Geryl Rankins, MD;  Location: [facility] [place];  Service: Gynecology;  Laterality: N/A;  Vulvar Abscess  . PLACEMENT OF SETON N/A 05/14/2013   Procedure:  PLACEMENT OF SETON;  Surgeon: Romie Levee, MD;  Location: [facility];  Service: General;  Laterality: N/A;  . RECTAL EXAM UNDER ANESTHESIA N/A 08/20/2013   Procedure: mucosal advancement flap ;  Surgeon: Romie Levee, MD;  Location: [facility];  Service: General;  Laterality: N/A;  parks anal retractor long rectal instrucments prone jack knife anal fistula   . RECTAL ULTRASOUND N/A 07/20/2013   Procedure: RECTAL ULTRASOUND;  Surgeon: Romie Levee, MD;  Location: [facility];  Service: Endoscopy;  Laterality: N/A;    ROS: Review of Systems Negative except as above. PHYSICAL EXAM: Temp 98.7 F (37.1 C) (Oral)   Resp 16   Wt 154 lb 9.6 oz (70.1 kg)   SpO2 98%   BMI 26.54 kg/m   Wt Readings from Last 3 Encounters:  10/13/18 154 lb 9.6 oz (70.1 kg)  06/12/18 151 lb 3.2 oz (68.6 kg)  03/12/18 149 lb 6.4 oz (67.8 kg)  BP 122/83  Physical Exam  General appearance - alert, well appearing, and in no distress Mental status - normal mood, behavior, speech, dress, motor activity, and thought processes Neck - supple, no significant adenopathy Chest - clear to auscultation, no wheezes, rales or rhonchi, symmetric air entry Heart - normal rate, regular rhythm, normal S1, S2, no murmurs, rubs, clicks or  gallops Musculoskeletal -left hip: Mild to moderate tenderness on palpation over the trochanteric bursa.  Good flexion, extension and rotation. Extremities - peripheral pulses normal, no pedal edema, no clubbing or cyanosis  Lab Results  Component Value Date   CHOL 283 (H) 01/29/2017   HDL 59 01/29/2017   LDLCALC 200 (H) 01/29/2017   TRIG 120 01/29/2017   CHOLHDL 4.8 (H) 01/29/2017    ASSESSMENT AND PLAN: 1. Trochanteric bursitis of left hip Patient given Toradol shot today.  She will hold off on using the Voltaren gel.  Will use Naprosyn as needed for several days then she can go back to using the Voltaren gel.  If no improvement we can refer to Ortho for injection. - ketorolac (TORADOL) 30 MG/ML injection 30 mg - naproxen (NAPROSYN) 500 MG tablet; Take 1 tablet (500 mg total) by mouth 2 (two) times daily as needed.  Dispense: 60 tablet; Refill: 0  2. Essential hypertension Close to goal.  Continue current medications and low-salt diet.  3. Need for Tdap vaccination Agreeable to having this today.  4. History of chronic back pain Followed by pain specialist.  5. Screening for breast cancer - MM Digital Screening; Future Apparently mammogram not covered by her insurance.  I have printed the waiver form for her to sign if she wishes to pay for it out of pocket.  6. Mixed hyperlipidemia - Lipid panel; Future - Hepatic Function Panel; Future  Patient was given the opportunity to ask questions.  Patient verbalized understanding of the plan and was able to repeat key elements of the plan.   Orders Placed This Encounter  Procedures  . MM Digital Screening  . Lipid panel  . Hepatic Function Panel     Requested Prescriptions   Signed Prescriptions Disp Refills  . Tdap (  BOOSTRIX) 5-2.5-18.5 LF-MCG/0.5 injection 0.5 mL 0    Sig: Inject 0.5 mLs into the muscle as directed.  . Tdap (BOOSTRIX) 5-2.5-18.5 LF-MCG/0.5 injection 0.5 mL 0    Sig: Inject 0.5 mLs into the muscle as  directed.  . naproxen (NAPROSYN) 500 MG tablet 60 tablet 0    Sig: Take 1 tablet (500 mg total) by mouth 2 (two) times daily as needed.    Return in about 3 months (around 01/12/2019).  Jonah Blue, MD, FACP

## 2018-10-23 ENCOUNTER — Ambulatory Visit: Payer: Medicare Other | Attending: Family Medicine

## 2018-10-23 DIAGNOSIS — E782 Mixed hyperlipidemia: Secondary | ICD-10-CM | POA: Insufficient documentation

## 2018-10-23 NOTE — Progress Notes (Signed)
Patient here for lab visit only 

## 2018-10-24 LAB — LIPID PANEL
Chol/HDL Ratio: 4.8 ratio — ABNORMAL HIGH (ref 0.0–4.4)
Cholesterol, Total: 331 mg/dL — ABNORMAL HIGH (ref 100–199)
HDL: 69 mg/dL (ref >39)
LDL CALC: 237 mg/dL — AB (ref 0–99)
Triglycerides: 126 mg/dL (ref 0–149)
VLDL Cholesterol Cal: 25 mg/dL (ref 5–40)

## 2018-10-24 LAB — HEPATIC FUNCTION PANEL
ALT: 11 IU/L (ref 0–32)
AST: 17 IU/L (ref 0–40)
Albumin: 4.6 g/dL (ref 3.5–5.5)
Alkaline Phosphatase: 79 IU/L (ref 39–117)
Bilirubin Total: 0.2 mg/dL (ref 0.0–1.2)
Bilirubin, Direct: 0.05 mg/dL (ref 0.00–0.40)
TOTAL PROTEIN: 7.7 g/dL (ref 6.0–8.5)

## 2018-10-25 ENCOUNTER — Other Ambulatory Visit: Payer: Self-pay | Admitting: Internal Medicine

## 2018-10-25 MED ORDER — ATORVASTATIN CALCIUM 20 MG PO TABS: 20.0000 mg | ORAL_TABLET | Freq: Every day | ORAL | 3 refills | Status: DC

## 2018-10-27 ENCOUNTER — Telehealth: Payer: Self-pay

## 2018-10-27 MED ORDER — SIMVASTATIN 10 MG PO TABS: 10.0000 mg | ORAL_TABLET | Freq: Every day | ORAL | 3 refills | Status: DC

## 2018-10-27 NOTE — Telephone Encounter (Signed)
Per Dr. Alvis LemmingsNewlin contact pt and see what other cholesterol medicine she has tried since she can't take Lipitor.  Also could you put Lipitor in her allergy list

## 2018-10-27 NOTE — Telephone Encounter (Signed)
Simvastatin sent to her pharmacy.

## 2018-10-27 NOTE — Telephone Encounter (Signed)
The patient did not remember the name of the previous -statin that she took, in her chart there was mention of Simvastatin and she said that sounds familiar and she would be ok with trying it. I let her know we would tell the provider and send a new RX to [facility].    As far as her cold goes she reports having cough and what sounded like chest congestion, we recommended Mucinex-Dm to ease those symptoms.

## 2018-10-27 NOTE — Telephone Encounter (Signed)
Contacted pt to go over lab results pt is aware  Pt states she can't take liptor because it makes her lips swell. Pt states she is also needing something for a cold. [facility]

## 2018-10-27 NOTE — Addendum Note (Signed)
Addended by: Hoy RegisterNEWLIN, Vansh Reckart on: 10/27/2018 06:04 PM   Modules accepted: Orders

## 2018-10-30 MED FILL — SIMVASTATIN 10 MG TABLET: 10 | 30 days supply | Qty: 30 | Fill #0

## 2018-11-10 ENCOUNTER — Ambulatory Visit
Admission: RE | Admit: 2018-11-10 | Discharge: 2018-11-10 | Disposition: A | Discharge status: Home / Self Care | Payer: Medicare Other | Source: Ambulatory Visit | Attending: Internal Medicine | Admitting: Internal Medicine

## 2018-11-10 DIAGNOSIS — Z1239 Encounter for other screening for malignant neoplasm of breast: Secondary | ICD-10-CM

## 2018-11-18 ENCOUNTER — Other Ambulatory Visit: Payer: Self-pay | Admitting: Internal Medicine

## 2018-11-18 DIAGNOSIS — M7062 Trochanteric bursitis, left hip: Secondary | ICD-10-CM

## 2018-11-18 MED FILL — AMLODIPINE BESYLATE 10 MG T: 10 | 30 days supply | Qty: 30 | Fill #1

## 2018-11-18 MED FILL — CHLORTHALIDONE 25 MG TAB: 25 | 30 days supply | Qty: 30 | Fill #1

## 2018-11-18 MED FILL — POTASSIUM CHLORIDE ER 10 ME: 10 | 30 days supply | Qty: 30 | Fill #2

## 2018-11-18 MED FILL — ESOMEPRAZOLE MAGNESIUM 20 M: 20 | 30 days supply | Qty: 60 | Fill #4

## 2018-11-19 MED FILL — LINZESS 290 MCG CAPSULE: 290 | 30 days supply | Qty: 30 | Fill #0

## 2018-11-19 MED FILL — DULoxetine HCL 60 MG CPEP: 60 | 90 days supply | Qty: 90 | Fill #0

## 2018-11-19 MED FILL — NAPROXEN 500 MG TABLET: 500 | 30 days supply | Qty: 60 | Fill #0

## 2018-11-24 ENCOUNTER — Telehealth: Payer: Self-pay

## 2018-11-24 NOTE — Telephone Encounter (Signed)
Contacted pt to go over MM results pt didn't answer left a detailed vm informing pt of results and if she has any questions or concerns to give me a call  

## 2018-12-02 ENCOUNTER — Other Ambulatory Visit: Payer: Self-pay | Admitting: Physical Medicine and Rehabilitation

## 2018-12-02 DIAGNOSIS — M5416 Radiculopathy, lumbar region: Secondary | ICD-10-CM

## 2018-12-03 MED FILL — ?SIMVASTATIN 10MG TABLE: 10 | 30 days supply | Qty: 30 | Fill #1

## 2018-12-06 ENCOUNTER — Ambulatory Visit
Admission: RE | Admit: 2018-12-06 | Discharge: 2018-12-06 | Disposition: A | Discharge status: Home / Self Care | Payer: Medicare Other | Source: Ambulatory Visit | Attending: Physical Medicine and Rehabilitation | Admitting: Physical Medicine and Rehabilitation

## 2018-12-06 DIAGNOSIS — M5416 Radiculopathy, lumbar region: Secondary | ICD-10-CM

## 2018-12-10 ENCOUNTER — Other Ambulatory Visit: Payer: Self-pay

## 2018-12-10 ENCOUNTER — Encounter: Payer: Self-pay | Admitting: Critical Care Medicine

## 2018-12-10 ENCOUNTER — Ambulatory Visit (HOSPITAL_BASED_OUTPATIENT_CLINIC_OR_DEPARTMENT_OTHER): Payer: Medicare Other | Admitting: Critical Care Medicine

## 2018-12-10 ENCOUNTER — Emergency Department (HOSPITAL_COMMUNITY)
Admission: EM | Admit: 2018-12-10 | Discharge: 2018-12-10 | Disposition: A | Discharge status: Home / Self Care | Payer: Medicare Other | Attending: Emergency Medicine | Admitting: Emergency Medicine

## 2018-12-10 ENCOUNTER — Encounter (HOSPITAL_COMMUNITY): Payer: Self-pay

## 2018-12-10 ENCOUNTER — Emergency Department (HOSPITAL_COMMUNITY): Payer: Medicare Other

## 2018-12-10 VITALS — BP 124/87 | HR 102 | Temp 98.3°F | Ht 64.0 in

## 2018-12-10 DIAGNOSIS — R404 Transient alteration of awareness: Secondary | ICD-10-CM

## 2018-12-10 DIAGNOSIS — H66003 Acute suppurative otitis media without spontaneous rupture of ear drum, bilateral: Secondary | ICD-10-CM | POA: Insufficient documentation

## 2018-12-10 DIAGNOSIS — B349 Viral infection, unspecified: Secondary | ICD-10-CM | POA: Diagnosis not present

## 2018-12-10 DIAGNOSIS — I10 Essential (primary) hypertension: Secondary | ICD-10-CM | POA: Insufficient documentation

## 2018-12-10 DIAGNOSIS — R42 Dizziness and giddiness: Secondary | ICD-10-CM | POA: Diagnosis present

## 2018-12-10 DIAGNOSIS — Z79899 Other long term (current) drug therapy: Secondary | ICD-10-CM | POA: Diagnosis not present

## 2018-12-10 LAB — BASIC METABOLIC PANEL
Anion gap: 13 (ref 5–15)
BUN: 7 mg/dL (ref 6–20)
CO2: 29 mmol/L (ref 22–32)
CREATININE: 0.77 mg/dL (ref 0.44–1.00)
Calcium: 10 mg/dL (ref 8.9–10.3)
Chloride: 100 mmol/L (ref 98–111)
GFR calc Af Amer: >60 mL/min (ref >60)
GFR calc non Af Amer: >60 mL/min (ref >60)
GLUCOSE: 121 mg/dL — AB (ref 70–99)
Potassium: 2.9 mmol/L — ABNORMAL LOW (ref 3.5–5.1)
Sodium: 142 mmol/L (ref 135–145)

## 2018-12-10 LAB — CBC WITH DIFFERENTIAL/PLATELET
Abs Immature Granulocytes: 0.01 10*3/uL (ref 0.00–0.07)
Basophils Absolute: 0 10*3/uL (ref 0.0–0.1)
Basophils Relative: 1 %
Eosinophils Absolute: 0.1 10*3/uL (ref 0.0–0.5)
Eosinophils Relative: 1 %
HCT: 44.1 % (ref 36.0–46.0)
Hemoglobin: 14.6 g/dL (ref 12.0–15.0)
Immature Granulocytes: 0 %
Lymphocytes Relative: 41 %
Lymphs Abs: 2.1 10*3/uL (ref 0.7–4.0)
MCH: 29.4 pg (ref 26.0–34.0)
MCHC: 33.1 g/dL (ref 30.0–36.0)
MCV: 88.7 fL (ref 80.0–100.0)
MONOS PCT: 7 %
Monocytes Absolute: 0.4 10*3/uL (ref 0.1–1.0)
Neutro Abs: 2.6 10*3/uL (ref 1.7–7.7)
Neutrophils Relative %: 50 %
Platelets: 337 10*3/uL (ref 150–400)
RBC: 4.97 MIL/uL (ref 3.87–5.11)
RDW: 12.6 % (ref 11.5–15.5)
WBC: 5.2 10*3/uL (ref 4.0–10.5)
nRBC: 0 % (ref 0.0–0.2)

## 2018-12-10 LAB — GLUCOSE, POCT (MANUAL RESULT ENTRY): POC Glucose: 124 mg/dl — AB (ref 70–99)

## 2018-12-10 MED ORDER — MECLIZINE HCL 25 MG PO TABS
25.0000 mg | ORAL_TABLET | Freq: Once | ORAL | Status: AC
  Administered 2018-12-10: 25 mg via ORAL
  Filled 2018-12-10: qty 1

## 2018-12-10 MED ORDER — POTASSIUM CHLORIDE CRYS ER 20 MEQ PO TBCR: 20.0000 meq | EXTENDED_RELEASE_TABLET | Freq: Two times a day (BID) | ORAL | 0 refills | Status: DC

## 2018-12-10 MED ORDER — DOXYCYCLINE HYCLATE 100 MG PO CAPS: 100.0000 mg | ORAL_CAPSULE | Freq: Two times a day (BID) | ORAL | 0 refills | Status: AC

## 2018-12-10 MED ORDER — POTASSIUM CHLORIDE CRYS ER 20 MEQ PO TBCR
40.0000 meq | EXTENDED_RELEASE_TABLET | Freq: Once | ORAL | Status: AC
  Administered 2018-12-10: 40 meq via ORAL
  Filled 2018-12-10: qty 2

## 2018-12-10 MED ORDER — MECLIZINE HCL 25 MG PO TABS: 25.0000 mg | ORAL_TABLET | Freq: Three times a day (TID) | ORAL | 0 refills | Status: DC | PRN

## 2018-12-10 MED ORDER — SODIUM CHLORIDE 0.9 % IV BOLUS
1000.0000 mL | Freq: Once | INTRAVENOUS | Status: AC
  Administered 2018-12-10: 1000 mL via INTRAVENOUS

## 2018-12-10 MED ORDER — POTASSIUM CHLORIDE 10 MEQ/100ML IV SOLN
10.0000 meq | Freq: Once | INTRAVENOUS | Status: AC
  Administered 2018-12-10: 10 meq via INTRAVENOUS
  Filled 2018-12-10: qty 100

## 2018-12-10 MED ORDER — ONDANSETRON HCL 4 MG/2ML IJ SOLN
4.0000 mg | Freq: Once | INTRAMUSCULAR | Status: AC
  Administered 2018-12-10: 4 mg via INTRAVENOUS
  Filled 2018-12-10: qty 2

## 2018-12-10 MED ORDER — POTASSIUM CHLORIDE CRYS ER 20 MEQ PO TBCR
20.0000 meq | EXTENDED_RELEASE_TABLET | Freq: Two times a day (BID) | ORAL | Status: DC
  Administered 2018-12-10: 20 meq via ORAL
  Filled 2018-12-10: qty 1

## 2018-12-10 MED FILL — DOXYCYCLINE HYCLATE 100 MG: 100 | 10 days supply | Qty: 20 | Fill #0

## 2018-12-10 MED FILL — MECLIZINE 25 MG TABLET: 25 | 10 days supply | Qty: 30 | Fill #0

## 2018-12-10 MED FILL — POTASSIUM CL ER 20 MEQ TAB: 20 | 2 days supply | Qty: 4 | Fill #0

## 2018-12-10 NOTE — Progress Notes (Signed)
   Subjective:    Patient ID: [name], female    DOB: [birth year], 53 y.o.   MRN: [id number]  HPI    Review of Systems     Objective:   Physical Exam        Assessment & Plan:

## 2018-12-10 NOTE — Progress Notes (Addendum)
Subjective:    Patient ID: [name], female    DOB: 1966-05-19, 53 y.o.   MRN: [id number]  This is a 53 year old female who has history of hypertension and hyperlipidemia who comes in today with a 4-day history of hot flashes the dizziness cold sweats dry cough slight dyspnea and some headaches.  She has had change in her hearing as well.  She is had a low-grade fever.  She has been on multiple medications in the past for treatment of low back pain and is seeing a pain clinic as well.  When the patient came into the clinic initially she was altered in her sensorium and sleepy and not able to ambulate she did improve as I examined her and did have increased engagement and ability to interact.  Initial vital signs screen was unremarkable.  As I laid the patient back in a supine position to examine her abdomen she became increasingly dizzy and distinct and displayed some degree of nystagmus and associated nausea.  Also has that as I examined her oral cavity she had an exact exaggerated gag response.  Based on these findings elected to have her go to the emergency room for further evaluation as we would need to include a CT scan and emergent laboratory values for return and perhaps neurology evaluation    Past Medical History:  Diagnosis Date  . 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 Anemia   . Constipation   . GERD (gastroesophageal reflux disease)   . Hemorrhoids   . HTN (hypertension)   . Hyperlipidemia   . Seasonal allergies 08-13-13   tx. Claritin  . Vertigo      Family History  Problem Relation Age of Onset  . Diabetes Mother   . Heart attack Father   . Diabetes Brother   . Diabetes Maternal Uncle   . Diabetes Maternal Aunt   . Seizures Sister   . Colon cancer Neg Hx   . Stomach cancer Neg Hx      Social History   Socioeconomic History  . Marital status: Single    Spouse name: Not on file  . Number of children: 2  . Years of education: 10  . Highest education level: Not on  file  Occupational History  . Occupation: N/A  Social Needs  . Financial resource strain: Not on file  . Food insecurity:    Worry: Not on file    Inability: Not on file  . Transportation needs:    Medical: Not on file    Non-medical: Not on file  Tobacco Use  . Smoking status: Never Smoker  . Smokeless tobacco: Never Used  Substance and Sexual Activity  . Alcohol use: Yes    Alcohol/week: 0.0 standard drinks    Comment: occa  . Drug use: No  . Sexual activity: Yes    Birth control/protection: Pill  Lifestyle  . Physical activity:    Days per week: Not on file    Minutes per session: Not on file  . Stress: Not on file  Relationships  . Social connections:    Talks on phone: Not on file    Gets together: Not on file    Attends religious service: Not on file    Active member of club or organization: Not on file    Attends meetings of clubs or organizations: Not on file    Relationship status: Not on file  . Intimate partner violence:    Fear of current or ex partner: Not  on file    Emotionally abused: Not on file    Physically abused: Not on file    Forced sexual activity: Not on file  Other Topics Concern  . Not on file  Social History Narrative   Lives at home w/ her mother   Right-handed   Caffeine: 1 cup of tea per day     Allergies  Allergen Reactions  . Ampicillin Anaphylaxis  . Atorvastatin Calcium Other (See Comments)    Pt reported lip swelling  . Penicillins Anaphylaxis  . Sulfonamide Derivatives Anaphylaxis  . Hydrocodone-Acetaminophen Hives    Have to take a Benadryl to take med  . Tramadol Itching  . Tylenol [Acetaminophen] Itching     Outpatient Medications Prior to Visit  Medication Sig Dispense Refill  . amLODipine (NORVASC) 10 MG tablet Take 1 tablet (10 mg total) by mouth daily. 30 tablet 2  . chlorthalidone (HYGROTON) 25 MG tablet Take 1 tablet (25 mg total) by mouth daily. 30 tablet 2  . DULoxetine (CYMBALTA) 30 MG capsule Take 1  capsule by mouth at bedtime.    Marland Kitchen LINZESS 290 MCG CAPS capsule TAKE 1 CAPSULE BY MOUTH DAILY BEFORE BREAKFAST. 30 capsule 3  . loratadine (CLARITIN) 10 MG tablet Take 10 mg by mouth daily.    Marland Kitchen oxycodone (OXY-IR) 5 MG capsule Take 5 mg by mouth every 8 (eight) hours as needed.    . potassium chloride (K-DUR) 10 MEQ tablet TAKE 1 CAPSULE BY MOUTH DAILY. 90 tablet 1  . simvastatin (ZOCOR) 10 MG tablet Take 1 tablet (10 mg total) by mouth at bedtime. 30 tablet 3  . esomeprazole (NEXIUM) 20 MG capsule Take 1 capsule (20 mg total) by mouth 2 (two) times daily before a meal. (Patient not taking: Reported on 12/10/2018) 60 capsule 5  . mometasone (NASONEX) 50 MCG/ACT nasal spray Place 1 spray into the nose daily. (Patient not taking: Reported on 12/10/2018) 17 g 12  . naproxen (NAPROSYN) 500 MG tablet TAKE 1 TABLET (500 MG TOTAL) BY MOUTH 2 (TWO) TIMES DAILY AS NEEDED. (Patient not taking: Reported on 12/10/2018) 60 tablet 0  . Tdap (BOOSTRIX) 5-2.5-18.5 LF-MCG/0.5 injection Inject 0.5 mLs into the muscle as directed. 0.5 mL 0  . Tdap (BOOSTRIX) 5-2.5-18.5 LF-MCG/0.5 injection Inject 0.5 mLs into the muscle as directed. 0.5 mL 0   No facility-administered medications prior to visit.       Review of Systems  Constitutional: Positive for activity change, appetite change, chills, diaphoresis and fatigue. Negative for fever.  HENT: Positive for congestion, ear pain, nosebleeds, postnasal drip, rhinorrhea and sore throat. Negative for sinus pressure, sinus pain and trouble swallowing.   Respiratory: Positive for cough. Negative for chest tightness and wheezing.        Coughs is dry  Cardiovascular: Negative for chest pain and leg swelling.  Gastrointestinal: Positive for vomiting. Negative for diarrhea and nausea.  Skin: Negative for rash.  Neurological: Positive for dizziness, weakness and light-headedness. Negative for seizures, syncope, facial asymmetry, speech difficulty, numbness and headaches.    Psychiatric/Behavioral: Positive for sleep disturbance.       Objective:   Physical Exam Vitals:   12/10/18 1049  BP: 124/87  Pulse: (!) 102  Temp: 98.3 F (36.8 C)  TempSrc: Oral  SpO2: 99%  Height: 5\' 4"  (1.626 m)    Gen: Pleasant, well-nourished, in no distress,  normal affect  ENT: Nasal passages are inflamed, there is nystagmus noted in the eyes Oropharynx shows exaggerated gag reflex  Neck:  No JVD, no TMG, no carotid bruits  Lungs: No use of accessory muscles, no dullness to percussion, clear without rales or rhonchi  Cardiovascular: RRR, heart sounds normal, no murmur or gallops, no peripheral edema  Abdomen: soft and NT, no HSM,  BS normal  Musculoskeletal: No deformities, no cyanosis or clubbing  Neuro: alert, non focal, there are periods when she gets drowsy and tries to fall asleep but she is easily awakened and and able to engage completely and is oriented to place person and time  Skin: Warm, no lesions or rashes  No results found.        Assessment & Plan:  While this may be simply viral syndrome I am concerned about the significant degree of sleepiness pre-existing history of use of opioids for chronic back pain and also a very long prescription list from outside providers relative to opioids and other medications for restless leg syndrome etc.  Because of her waxing and waning consciousness and the nystagmus it would be best to have her seen in the emergency room we can get labs nearly back and perhaps even a CT of the head  The patient is safe to transfer by private car to the emergency room  Note pt had bilateral suppurative OM on exam in ED after cerumen removed.  Sent out on ABX. No other acute process discerned

## 2018-12-10 NOTE — ED Triage Notes (Signed)
Pt reports dizziness since Sunday, worse with ambulation. Pt also reports intermittent nausea. Slight headache, no vision changes.

## 2018-12-10 NOTE — Discharge Instructions (Addendum)
Take the potassium tablets as prescribed for the next 2 days, then you may resume your daily as prescribed previously.

## 2018-12-10 NOTE — ED Notes (Signed)
Pt in MRI.

## 2018-12-10 NOTE — Progress Notes (Signed)
Pt stated Sunday is when the dizziness started  Pt states when she stands up she feels like she is going to fall out  Pt states when she is laying down her head states spinning  Pt states she hasn't taken anything for the dizziness  Pt states when she was in the lobby she broke out in cold sweats

## 2018-12-10 NOTE — ED Notes (Signed)
Ambulated pt. EDP at bedside.  Pt still unsteady gait although states she feels better.

## 2018-12-10 NOTE — ED Provider Notes (Signed)
[facility] EMERGENCY DEPARTMENT Provider Note   CSN: [id number] Arrival date & time: 12/10/18  1134     History   Chief Complaint Chief Complaint  Patient presents with  . Dizziness  . Nausea    HPI [name] is a 53 y.o. female.  HPI   53yo female with history of htn, hlpd, presents with concern for vertigo from her PCP office.  Reportedly she was very sleepy in the office as well.  Reports dizziness began Sunday. Feels like prior vertigo. Room spinning that worsens with standing, walking, or turning her head. Improves if she lays down and doesn't move.  Associated nausea, no vomiting.   No numbness, weakness, visual changes, trouble talking. Denies urinary symptoms,abdominal pain, chest pain or dyspnea.   Does reports congestion with some bright red blood.  Past Medical History:  Diagnosis Date  . 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 Anemia   . Constipation   . GERD (gastroesophageal reflux disease)   . Hemorrhoids   . HTN (hypertension)   . Hyperlipidemia   . Seasonal allergies 08-13-13   tx. Claritin  . Vertigo     Patient Active Problem List   Diagnosis Date Noted  . Altered consciousness 12/10/2018  . Carpal tunnel syndrome of right wrist 06/12/2018  . Postnasal drip 06/12/2018  . Vertigo 10/18/2016  . Right arm pain 04/19/2014  . Disturbance of skin sensation 04/19/2014  . Anal fistula 04/14/2013  . UNSPECIFIED SITE OF SPRAIN AND STRAIN 07/07/2009  . PEDAL EDEMA 06/29/2009  . MUSCLE PAIN 06/23/2009  . 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 ALLERGIC RHINITIS 03/11/2009  . SYNCOPE, VASOVAGAL 03/11/2009  . HEADACHE 03/11/2009    Past Surgical History:  Procedure Laterality Date  . ANAL FISTULECTOMY  08/22/2012   Procedure: FISTULECTOMY ANAL;  Surgeon: Lodema Pilot, DO;  Location: [facility];  Service: General;  Laterality: N/A;  rectal examination under anesthesia with seton placement   . ANAL FISTULECTOMY  09/17/2012   Procedure: FISTULECTOMY  ANAL;  Surgeon: Lodema Pilot, DO;  Location: [facility];  Service: General;  Laterality: N/A;  possible fistulotomy  . D & C HYSTEROSCOPY/ NOVASURE ENDOMETRIAL ABLATION/ I & D THROMBOSED HEMORROID  01-10-2007  DR Miguel Aschoff  . EXAMINATION UNDER ANESTHESIA  09/17/2012   Procedure: EXAM UNDER ANESTHESIA;  Surgeon: Lodema Pilot, DO;  Location: [facility];  Service: General;  Laterality: N/A;  Rectal exam under anesthesia  . EXAMINATION UNDER ANESTHESIA N/A 05/14/2013   Procedure: EXAM UNDER ANESTHESIA;  Surgeon: Romie Levee, MD;  Location: [facility];  Service: General;  Laterality: N/A;  . FISTULA PLUG  09/17/2012   Procedure: FISTULA PLUG;  Surgeon: Lodema Pilot, DO;  Location: [facility];  Service: General;  Laterality: N/A;  Possible fistula plug  . HEMORRHOID SURGERY  04-28-2009   [facility] INTERNAL HEMORRHOIDS  . IRRIGATION AND DEBRIDEMENT ABSCESS  07/08/2012   Procedure: MINOR INCISION AND DRAINAGE OF ABSCESS;  Surgeon: Geryl Rankins, MD;  Location: [facility];  Service: Gynecology;  Laterality: N/A;  Vulvar Abscess  . PLACEMENT OF SETON N/A 05/14/2013   Procedure:  PLACEMENT OF SETON;  Surgeon: Romie Levee, MD;  Location[facility];  Service: General;  Laterality: N/A;  . RECTAL EXAM UNDER ANESTHESIA N/A 08/20/2013   Procedure: mucosal advancement flap ;  Surgeon: Romie Levee, MD;  Location: [facility];  Service: General;  Laterality: N/A;  parks anal retractor long rectal instrucments prone jack knife  anal fistula   . RECTAL ULTRASOUND N/A 07/20/2013   Procedure: RECTAL ULTRASOUND;  Surgeon: Romie LeveeAlicia Thomas, MD;  Location: [facility];  Service: Endoscopy;  Laterality: N/A;     OB History   No obstetric history on file.      Home Medications    Prior to Admission medications   Medication Sig Start Date End Date Taking? Authorizing Provider  amLODipine (NORVASC) 10 MG tablet Take 1 tablet (10 mg  total) by mouth daily. 10/10/18   Marcine MatarJohnson, Deborah B, MD  chlorthalidone (HYGROTON) 25 MG tablet Take 1 tablet (25 mg total) by mouth daily. 10/10/18   Marcine MatarJohnson, Deborah B, MD  doxycycline (VIBRAMYCIN) 100 MG capsule Take 1 capsule (100 mg total) by mouth 2 (two) times daily for 10 days. 12/10/18 12/20/18  Alvira MondaySchlossman, Ranveer Wahlstrom, MD  DULoxetine (CYMBALTA) 30 MG capsule Take 1 capsule by mouth at bedtime.    [provider]  esomeprazole (NEXIUM) 20 MG capsule Take 1 capsule (20 mg total) by mouth 2 (two) times daily before a meal. Patient not taking: Reported on 12/10/2018 06/12/18   Marcine MatarJohnson, Deborah B, MD  LINZESS 290 MCG CAPS capsule TAKE 1 CAPSULE BY MOUTH DAILY BEFORE BREAKFAST. 11/18/18   Hilarie FredricksonPerry, John N, MD  loratadine (CLARITIN) 10 MG tablet Take 10 mg by mouth daily.    [provider]  meclizine (ANTIVERT) 25 MG tablet Take 1 tablet (25 mg total) by mouth 3 (three) times daily as needed for dizziness. 12/10/18   Alvira MondaySchlossman, Ahyana Skillin, MD  mometasone (NASONEX) 50 MCG/ACT nasal spray Place 1 spray into the nose daily. Patient not taking: Reported on 12/10/2018 06/12/18   Marcine MatarJohnson, Deborah B, MD  naproxen (NAPROSYN) 500 MG tablet TAKE 1 TABLET (500 MG TOTAL) BY MOUTH 2 (TWO) TIMES DAILY AS NEEDED. Patient not taking: Reported on 12/10/2018 11/19/18   Marcine MatarJohnson, Deborah B, MD  oxycodone (OXY-IR) 5 MG capsule Take 5 mg by mouth every 8 (eight) hours as needed.    [provider]  potassium chloride (K-DUR) 10 MEQ tablet TAKE 1 CAPSULE BY MOUTH DAILY. 09/09/18   Marcine MatarJohnson, Deborah B, MD  potassium chloride SA (K-DUR,KLOR-CON) 20 MEQ tablet Take 1 tablet (20 mEq total) by mouth 2 (two) times daily for 2 days. 12/10/18 12/12/18  Alvira MondaySchlossman, Miriana Gaertner, MD  simvastatin (ZOCOR) 10 MG tablet Take 1 tablet (10 mg total) by mouth at bedtime. 10/27/18   Hoy RegisterNewlin, Enobong, MD  Tdap (BOOSTRIX) 5-2.5-18.5 LF-MCG/0.5 injection Inject 0.5 mLs into the muscle as directed. 10/13/18   Marcine MatarJohnson, Deborah B, MD  Tdap Leda Min(BOOSTRIX) 5-2.5-18.5  LF-MCG/0.5 injection Inject 0.5 mLs into the muscle as directed. 10/13/18   Marcine MatarJohnson, Deborah B, MD    Family History Family History  Problem Relation Age of Onset  . Diabetes Mother   . Heart attack Father   . Diabetes Brother   . Diabetes Maternal Uncle   . Diabetes Maternal Aunt   . Seizures Sister   . Colon cancer Neg Hx   . Stomach cancer Neg Hx     Social History Social History   Tobacco Use  . Smoking status: Never Smoker  . Smokeless tobacco: Never Used  Substance Use Topics  . Alcohol use: Yes    Alcohol/week: 0.0 standard drinks    Comment: occa  . Drug use: No     Allergies   Ampicillin; Atorvastatin calcium; Penicillins; Sulfonamide derivatives; Hydrocodone-acetaminophen; Tramadol; and Tylenol [acetaminophen]   Review of Systems Review of Systems  Constitutional: Negative for fever.  HENT: Negative for  sore throat.   Eyes: Negative for visual disturbance.  Respiratory: Negative for cough and shortness of breath.   Cardiovascular: Negative for chest pain.  Gastrointestinal: Positive for nausea. Negative for abdominal pain and vomiting.  Genitourinary: Negative for difficulty urinating.  Musculoskeletal: Negative for back pain and neck pain.  Skin: Negative for rash.  Neurological: Positive for dizziness. Negative for syncope, facial asymmetry, speech difficulty, weakness, numbness and headaches.     Physical Exam Updated Vital Signs BP 124/80   Pulse 79   Temp 98.7 F (37.1 C) (Oral)   Resp 11   SpO2 98%   Physical Exam Vitals signs and nursing note reviewed.  Constitutional:      General: She is not in acute distress.    Appearance: She is well-developed. She is not diaphoretic.  HENT:     Head: Normocephalic and atraumatic.     Comments: Small amount of cotton removed from left ear, cerumen obstructing After irrigation, signs of TM bulging Eyes:     General: No visual field deficit.    Conjunctiva/sclera: Conjunctivae normal.      Comments: Nystagmus horizontal with turning head to left on head impulse testing  Neck:     Musculoskeletal: Normal range of motion.  Cardiovascular:     Rate and Rhythm: Normal rate and regular rhythm.     Heart sounds: Normal heart sounds. No murmur. No friction rub. No gallop.   Pulmonary:     Effort: Pulmonary effort is normal. No respiratory distress.     Breath sounds: Normal breath sounds. No wheezing or rales.  Abdominal:     General: There is no distension.     Palpations: Abdomen is soft.     Tenderness: There is no abdominal tenderness. There is no guarding.  Musculoskeletal:        General: No tenderness.  Skin:    General: Skin is warm and dry.     Findings: No erythema or rash.  Neurological:     Mental Status: She is alert and oriented to person, place, and time.     GCS: GCS eye subscore is 4. GCS verbal subscore is 5. GCS motor subscore is 6.     Cranial Nerves: Cranial nerves are intact. No cranial nerve deficit, dysarthria or facial asymmetry.     Sensory: Sensation is intact. No sensory deficit.     Motor: Motor function is intact.     Coordination: Finger-Nose-Finger Test normal.     Gait: Abnormal gait: gait initially deferred, on reeval gait examined and unsteady, falling towards the side.      ED Treatments / Results  Labs (all labs ordered are listed, but only abnormal results are displayed) Labs Reviewed  BASIC METABOLIC PANEL - Abnormal; Notable for the following components:      Result Value   Potassium 2.9 (*)    Glucose, Bld 121 (*)    All other components within normal limits  CBC WITH DIFFERENTIAL/PLATELET    EKG EKG Interpretation  Date/Time:  Wednesday December 10 2018 11:48:59 EST Ventricular Rate:  89 PR Interval:    QRS Duration: 60 QT Interval:  421 QTC Calculation: 513 R Axis:   49 Text Interpretation:  Sinus rhythm Borderline T abnormalities, anterior leads Prolonged QT interval Since prior ECG, QTc is longer Confirmed by  Alvira MondaySchlossman, Dyon Rotert ([id number]) on 12/10/2018 2:58:02 PM   Radiology Mr Brain Wo Contrast  Result Date: 12/10/2018 CLINICAL DATA:  Dizziness beginning 2 days ago. EXAM: MRI HEAD WITHOUT CONTRAST TECHNIQUE:  Multiplanar, multiecho pulse sequences of the brain and surrounding structures were obtained without intravenous contrast. COMPARISON:  MRI 10/26/2016 FINDINGS: Brain: The brain has a normal appearance without evidence of malformation, atrophy, old or acute small or large vessel infarction, mass lesion, hemorrhage, hydrocephalus or extra-axial collection. Vascular: Major vessels at the base of the brain show flow. Venous sinuses appear patent. Skull and upper cervical spine: Normal. Sinuses/Orbits: Clear/normal. No fluid in the middle ears or mastoids. Other: None significant. IMPRESSION: Normal examination. No abnormality seen to explain the clinical presentation. Electronically Signed   By: Paulina Fusi M.D.   On: 12/10/2018 14:37    Procedures Procedures (including critical care time)  Medications Ordered in ED Medications  meclizine (ANTIVERT) tablet 25 mg (25 mg Oral Given 12/10/18 1229)  sodium chloride 0.9 % bolus 1,000 mL (0 mLs Intravenous Stopped 12/10/18 1309)  ondansetron (ZOFRAN) injection 4 mg (4 mg Intravenous Given 12/10/18 1228)  potassium chloride SA (K-DUR,KLOR-CON) CR tablet 40 mEq (40 mEq Oral Given 12/10/18 1317)  potassium chloride 10 mEq in 100 mL IVPB (0 mEq Intravenous Stopped 12/10/18 1557)     Initial Impression / Assessment and Plan / ED Course  I have reviewed the triage vital signs and the nursing notes.  Pertinent labs & imaging results that were available during my care of the patient were reviewed by me and considered in my medical decision making (see chart for details).     53yo female with the history above including htn, hlpd, presents with concern for vertigo.  Differential diagnosis for dizziness includes central causes such as stroke, intracranial bleed, mass and  peripheral causes such as BPPV, meniere's disease, viral.  Labs and ECG without acute abnormalities, no significant anemia.  Potassium levels 2.9, mild prolonged QTc, given K replacement. Initial hx and exam most consistent with peripheral vertigo.  Given zofran, meclizine,fluid and reports improvement in symptoms.  Attempted to ambulate pt after she reported improvement and she continues to have an unsteady gait.  Ordered MRI given risk factors, continued gait abnormality, which shows no acute abnormalities.  Pt with peripheral vertigo, signs of otitis media on exam.  Given rx for meclizine, doxy (penicillin allergy) and recommend PCP follow up.  Pt with normal ambulation prior to leaving the ED.    Final Clinical Impressions(s) / ED Diagnoses   Final diagnoses:  Vertigo  Acute suppurative otitis media of both ears without spontaneous rupture of tympanic membranes, recurrence not specified    ED Discharge Orders         Ordered    doxycycline (VIBRAMYCIN) 100 MG capsule  2 times daily     12/10/18 1535    meclizine (ANTIVERT) 25 MG tablet  3 times daily PRN     12/10/18 1535    potassium chloride SA (K-DUR,KLOR-CON) 20 MEQ tablet  2 times daily     12/10/18 1606           Alvira Monday, MD 12/10/18 2235

## 2018-12-22 MED FILL — AMLODIPINE BESYLATE 10 MG T: 10 | 30 days supply | Qty: 30 | Fill #2

## 2018-12-22 MED FILL — LINZESS 290 MCG CAPSULE: 290 | 30 days supply | Qty: 30 | Fill #1

## 2018-12-22 MED FILL — CHLORTHALIDONE 25 MG TAB: 25 | 30 days supply | Qty: 30 | Fill #2

## 2018-12-22 MED FILL — POTASSIUM CHLORIDE ER 10 ME: 10 | 30 days supply | Qty: 30 | Fill #3

## 2018-12-23 ENCOUNTER — Ambulatory Visit: Payer: Medicare Other | Attending: Nurse Practitioner | Admitting: Nurse Practitioner

## 2018-12-23 ENCOUNTER — Encounter: Payer: Self-pay | Admitting: Nurse Practitioner

## 2018-12-23 VITALS — BP 109/74 | HR 78 | Temp 98.5°F | Ht 64.0 in | Wt 150.6 lb

## 2018-12-23 DIAGNOSIS — H6591 Unspecified nonsuppurative otitis media, right ear: Secondary | ICD-10-CM | POA: Diagnosis not present

## 2018-12-23 MED ORDER — FLUTICASONE PROPIONATE 50 MCG/ACT NA SUSP: 2.0000 | Freq: Every day | NASAL | 6 refills | Status: DC

## 2018-12-23 MED ORDER — CETIRIZINE HCL 10 MG PO TABS: 10.0000 mg | ORAL_TABLET | Freq: Every day | ORAL | 11 refills | Status: DC

## 2018-12-23 MED FILL — FLUTICASONE PROP 50 MCG SPR: 50 | 30 days supply | Qty: 16 | Fill #0

## 2018-12-23 NOTE — Progress Notes (Signed)
Assessment & Plan:  [name] was seen today for ear pain.  Diagnoses and all orders for this visit:  Middle ear effusion, right -     fluticasone (FLONASE) 50 MCG/ACT nasal spray; Place 2 sprays into both nostrils daily. -     cetirizine (ZYRTEC) 10 MG tablet; Take 1 tablet (10 mg total) by mouth daily.    Patient has been counseled on age-appropriate routine health concerns for screening and prevention. These are reviewed and up-to-date. Referrals have been placed accordingly. Immunizations are up-to-date or declined.    Subjective:   Chief Complaint  Patient presents with  . Ear Pain    Pt. stated her right ear is giving her pain and she feels dizzy when she sits or lay down.    HPI [name] 53 y.o. female presents to office today with complaints ear pain and intermittent dizziness.  She has a history of hypertension, hyperlipidemia and was recently evaluated for vertigo on December 10, 2018.  Her left ear was irrigated in the emergency room due to cerumen obstruction with resulting bulging of the tympanic membrane.  She was diagnosed with vertigo and prescribed meclizine as well as bilateral acute suppurative otitis media without spontaneous rupture of TMs and was also treated with doxycycline 100 mg 2 times daily for 10 days.  Today she has complaints of right-sided ear pain worsening when lying down as well as intermittent dizziness with positional changes.  Dizziness has improved with initiation of meclizine.  She does have a history of allergic rhinitis and is currently taking loratadine which she has taken for many years now.    Review of Systems  Constitutional: Negative for fever, malaise/fatigue and weight loss.  HENT: Positive for congestion, ear pain and sore throat. Negative for hearing loss, nosebleeds, sinus pain and tinnitus.   Eyes: Negative.  Negative for blurred vision, double vision and photophobia.  Respiratory: Negative.  Negative for cough and shortness of  breath.   Cardiovascular: Negative.  Negative for chest pain, palpitations and leg swelling.  Gastrointestinal: Negative.  Negative for heartburn, nausea and vomiting.  Musculoskeletal: Negative.  Negative for myalgias.  Neurological: Positive for dizziness. Negative for focal weakness, seizures and headaches.  Psychiatric/Behavioral: Negative.  Negative for suicidal ideas.    Past Medical History:  Diagnosis Date  . 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 Anemia   . Constipation   . GERD (gastroesophageal reflux disease)   . Hemorrhoids   . HTN (hypertension)   . Hyperlipidemia   . Seasonal allergies 08-13-13   tx. Claritin  . Vertigo     Past Surgical History:  Procedure Laterality Date  . ANAL FISTULECTOMY  08/22/2012   Procedure: FISTULECTOMY ANAL;  Surgeon: Lodema PilotBrian Layton, DO;  Location: [facility];  Service: General;  Laterality: N/A;  rectal examination under anesthesia with seton placement   . ANAL FISTULECTOMY  09/17/2012   Procedure: FISTULECTOMY ANAL;  Surgeon: Lodema PilotBrian Layton, DO;  Location: [facility];  Service: General;  Laterality: N/A;  possible fistulotomy  . D & C HYSTEROSCOPY/ NOVASURE ENDOMETRIAL ABLATION/ I & D THROMBOSED HEMORROID  01-10-2007  DR Miguel AschoffALLAN ROSS  . EXAMINATION UNDER ANESTHESIA  09/17/2012   Procedure: EXAM UNDER ANESTHESIA;  Surgeon: Lodema PilotBrian Layton, DO;  Location: [facility];  Service: General;  Laterality: N/A;  Rectal exam under anesthesia  . EXAMINATION UNDER ANESTHESIA N/A 05/14/2013   Procedure: EXAM UNDER ANESTHESIA;  Surgeon: Romie LeveeAlicia Thomas, MD;  [facility];  Service: General;  Laterality: N/A;  . FISTULA PLUG  09/17/2012   Procedure: FISTULA PLUG;  Surgeon: Lodema Pilot, DO;  Location: [facility];  Service: General;  Laterality: N/A;  Possible fistula plug  . HEMORRHOID SURGERY  04-28-2009   [facility] INTERNAL HEMORRHOIDS  . IRRIGATION AND DEBRIDEMENT ABSCESS  07/08/2012   Procedure: MINOR INCISION AND  DRAINAGE OF ABSCESS;  Surgeon: Geryl Rankins, MD;  Location: [facility] [place];  Service: Gynecology;  Laterality: N/A;  Vulvar Abscess  . PLACEMENT OF SETON N/A 05/14/2013   Procedure:  PLACEMENT OF SETON;  Surgeon: Romie Levee, MD;  Location: [facility];  Service: General;  Laterality: N/A;  . RECTAL EXAM UNDER ANESTHESIA N/A 08/20/2013   Procedure: mucosal advancement flap ;  Surgeon: Romie Levee, MD;  Location: [facility];  Service: General;  Laterality: N/A;  parks anal retractor long rectal instrucments prone jack knife anal fistula   . RECTAL ULTRASOUND N/A 07/20/2013   Procedure: RECTAL ULTRASOUND;  Surgeon: Romie Levee, MD;  Location: [facility];  Service: Endoscopy;  Laterality: N/A;    Family History  Problem Relation Age of Onset  . Diabetes Mother   . Heart attack Father   . Diabetes Brother   . Diabetes Maternal Uncle   . Diabetes Maternal Aunt   . Seizures Sister   . Colon cancer Neg Hx   . Stomach cancer Neg Hx     Social History Reviewed with no changes to be made today.   Outpatient Medications Prior to Visit  Medication Sig Dispense Refill  . amLODipine (NORVASC) 10 MG tablet Take 1 tablet (10 mg total) by mouth daily. 30 tablet 2  . chlorthalidone (HYGROTON) 25 MG tablet Take 1 tablet (25 mg total) by mouth daily. 30 tablet 2  . DULoxetine (CYMBALTA) 30 MG capsule Take 1 capsule by mouth at bedtime.    Marland Kitchen LINZESS 290 MCG CAPS capsule TAKE 1 CAPSULE BY MOUTH DAILY BEFORE BREAKFAST. 30 capsule 3  . loratadine (CLARITIN) 10 MG tablet Take 10 mg by mouth daily.    . meclizine (ANTIVERT) 25 MG tablet Take 1 tablet (25 mg total) by mouth 3 (three) times daily as needed for dizziness. 30 tablet 0  . oxycodone (OXY-IR) 5 MG capsule Take 5 mg by mouth every 8 (eight) hours as needed.    . potassium chloride (K-DUR) 10 MEQ tablet TAKE 1 CAPSULE BY MOUTH DAILY. 90 tablet 1  . simvastatin (ZOCOR) 10 MG tablet Take 1 tablet (10 mg total) by mouth at  bedtime. 30 tablet 3  . esomeprazole (NEXIUM) 20 MG capsule Take 1 capsule (20 mg total) by mouth 2 (two) times daily before a meal. (Patient not taking: Reported on 12/10/2018) 60 capsule 5  . mometasone (NASONEX) 50 MCG/ACT nasal spray Place 1 spray into the nose daily. (Patient not taking: Reported on 12/10/2018) 17 g 12  . naproxen (NAPROSYN) 500 MG tablet TAKE 1 TABLET (500 MG TOTAL) BY MOUTH 2 (TWO) TIMES DAILY AS NEEDED. (Patient not taking: Reported on 12/10/2018) 60 tablet 0  . potassium chloride SA (K-DUR,KLOR-CON) 20 MEQ tablet Take 1 tablet (20 mEq total) by mouth 2 (two) times daily for 2 days. 4 tablet 0  . Tdap (BOOSTRIX) 5-2.5-18.5 LF-MCG/0.5 injection Inject 0.5 mLs into the muscle as directed. (Patient not taking: Reported on 12/23/2018) 0.5 mL 0  . Tdap (BOOSTRIX) 5-2.5-18.5 LF-MCG/0.5 injection Inject 0.5 mLs into the muscle as directed. (Patient not taking: Reported on 12/23/2018) 0.5 mL 0   No  facility-administered medications prior to visit.     Allergies  Allergen Reactions  . Ampicillin Anaphylaxis  . Atorvastatin Calcium Other (See Comments)    Pt reported lip swelling  . Penicillins Anaphylaxis  . Sulfonamide Derivatives Anaphylaxis  . Hydrocodone-Acetaminophen Hives    Have to take a Benadryl to take med  . Tramadol Itching  . Tylenol [Acetaminophen] Itching       Objective:    BP 109/74 (BP Location: Left Arm, Patient Position: Sitting, Cuff Size: Normal)   Pulse 78   Temp 98.5 F (36.9 C) (Oral)   Ht 5\' 4"  (1.626 m)   Wt 150 lb 9.6 oz (68.3 kg)   SpO2 95%   BMI 25.85 kg/m  Wt Readings from Last 3 Encounters:  12/23/18 150 lb 9.6 oz (68.3 kg)  10/13/18 154 lb 9.6 oz (70.1 kg)  06/12/18 151 lb 3.2 oz (68.6 kg)    Physical Exam Vitals signs and nursing note reviewed.  Constitutional:      Appearance: She is well-developed.  HENT:     Head: Normocephalic and atraumatic.     Jaw: There is normal jaw occlusion.     Right Ear: Hearing, ear canal and  external ear normal. A middle ear effusion is present. Tympanic membrane is bulging.     Left Ear: Hearing, ear canal and external ear normal. Tympanic membrane is bulging.     Nose:     Right Turbinates: Enlarged and pale.     Left Turbinates: Enlarged and pale.     Right Sinus: No maxillary sinus tenderness or frontal sinus tenderness.     Left Sinus: No maxillary sinus tenderness or frontal sinus tenderness.     Mouth/Throat:     Pharynx: No pharyngeal swelling or oropharyngeal exudate.     Tonsils: Swelling: 2+ on the right. 2+ on the left.  Neck:     Musculoskeletal: Normal range of motion.  Cardiovascular:     Rate and Rhythm: Normal rate and regular rhythm.     Heart sounds: Normal heart sounds. No murmur. No friction rub. No gallop.   Pulmonary:     Effort: Pulmonary effort is normal. No tachypnea or respiratory distress.     Breath sounds: Normal breath sounds. No decreased breath sounds, wheezing, rhonchi or rales.  Chest:     Chest wall: No tenderness.  Abdominal:     General: Bowel sounds are normal.     Palpations: Abdomen is soft.  Skin:    General: Skin is warm and dry.  Neurological:     Mental Status: She is alert and oriented to person, place, and time.     Coordination: Coordination normal.  Psychiatric:        Behavior: Behavior normal. Behavior is cooperative.        Thought Content: Thought content normal.        Judgment: Judgment normal.          Patient has been counseled extensively about nutrition and exercise as well as the importance of adherence with medications and regular follow-up. The patient was given clear instructions to go to ER or return to medical center if symptoms don't improve, worsen or new problems develop. The patient verbalized understanding.   Follow-up: No follow-ups on file.   Claiborne Rigg, FNP-BC [facility] [place], [place] [phone]   12/23/2018, 1:40 PM

## 2018-12-23 NOTE — Patient Instructions (Signed)
Dizziness Dizziness is a common problem. It is a feeling of unsteadiness or light-headedness. You may feel like you are about to faint. Dizziness can lead to injury if you stumble or fall. Anyone can become dizzy, but dizziness is more common in older adults. This condition can be caused by a number of things, including medicines, dehydration, or illness. Follow these instructions at home: Eating and drinking  Drink enough fluid to keep your urine clear or pale yellow. This helps to keep you from becoming dehydrated. Try to drink more clear fluids, such as water.  Do not drink alcohol.  Limit your caffeine intake if told to do so by your health care provider. Check ingredients and nutrition facts to see if a food or beverage contains caffeine.  Limit your salt (sodium) intake if told to do so by your health care provider. Check ingredients and nutrition facts to see if a food or beverage contains sodium. Activity  Avoid making quick movements. ? Rise slowly from chairs and steady yourself until you feel okay. ? In the morning, first sit up on the side of the bed. When you feel okay, stand slowly while you hold onto something until you know that your balance is fine.  If you need to stand in one place for a long time, move your legs often. Tighten and relax the muscles in your legs while you are standing.  Do not drive or use heavy machinery if you feel dizzy.  Avoid bending down if you feel dizzy. Place items in your home so that they are easy for you to reach without leaning over. Lifestyle  Do not use any products that contain nicotine or tobacco, such as cigarettes and e-cigarettes. If you need help quitting, ask your health care provider.  Try to reduce your stress level by using methods such as yoga or meditation. Talk with your health care provider if you need help to manage your stress. General instructions  Watch your dizziness for any changes.  Take over-the-counter and  prescription medicines only as told by your health care provider. Talk with your health care provider if you think that your dizziness is caused by a medicine that you are taking.  Tell a friend or a family member that you are feeling dizzy. If he or she notices any changes in your behavior, have this person call your health care provider.  Keep all follow-up visits as told by your health care provider. This is important. Contact a health care provider if:  Your dizziness does not go away.  Your dizziness or light-headedness gets worse.  You feel nauseous.  You have reduced hearing.  You have new symptoms.  You are unsteady on your feet or you feel like the room is spinning. Get help right away if:  You vomit or have diarrhea and are unable to eat or drink anything.  You have problems talking, walking, swallowing, or using your arms, hands, or legs.  You feel generally weak.  You are not thinking clearly or you have trouble forming sentences. It may take a friend or family member to notice this.  You have chest pain, abdominal pain, shortness of breath, or sweating.  Your vision changes.  You have any bleeding.  You have a severe headache.  You have neck pain or a stiff neck.  You have a fever. These symptoms may represent a serious problem that is an emergency. Do not wait to see if the symptoms will go away. Get medical help   right away. Call your local emergency services (911 in the [place] Do not drive yourself to the hospital. Summary  Dizziness is a feeling of unsteadiness or light-headedness. This condition can be caused by a number of things, including medicines, dehydration, or illness.  Anyone can become dizzy, but dizziness is more common in older adults.  Drink enough fluid to keep your urine clear or pale yellow. Do not drink alcohol.  Avoid making quick movements if you feel dizzy. Monitor your dizziness for any changes. This information is not intended to  replace advice given to you by your health care provider. Make sure you discuss any questions you have with your health care provider. Document Released: 04/17/2001 Document Revised: 11/24/2016 Document Reviewed: 11/24/2016 Elsevier Interactive Patient Education  2019 Elsevier Inc.  

## 2018-12-26 MED FILL — rOPINIRole HCL 2 MG TABS: 2 | 90 days supply | Qty: 90 | Fill #0

## 2019-01-15 ENCOUNTER — Ambulatory Visit: Payer: Medicare Other | Admitting: Internal Medicine

## 2019-01-16 ENCOUNTER — Other Ambulatory Visit: Payer: Self-pay | Admitting: Internal Medicine

## 2019-01-16 DIAGNOSIS — I10 Essential (primary) hypertension: Secondary | ICD-10-CM

## 2019-01-16 MED FILL — ESOMEPRAZOLE MAGNESIUM 20 M: 20 | 30 days supply | Qty: 60 | Fill #5

## 2019-01-16 MED FILL — ?SIMVASTATIN 10MG TABLE: 10 | 30 days supply | Qty: 30 | Fill #2

## 2019-01-16 MED FILL — POTASSIUM CHLORIDE ER 10 ME: 10 | 30 days supply | Qty: 30 | Fill #4

## 2019-01-19 MED FILL — CHLORTHALIDONE 25 MG TAB: 25 | 30 days supply | Qty: 30 | Fill #0

## 2019-01-19 MED FILL — AMLODIPINE BESYLATE 10 MG T: 10 | 30 days supply | Qty: 30 | Fill #0

## 2019-01-27 ENCOUNTER — Telehealth: Payer: Self-pay | Admitting: Internal Medicine

## 2019-01-27 MED ORDER — BENZONATATE 100 MG PO CAPS: 100.0000 mg | ORAL_CAPSULE | Freq: Three times a day (TID) | ORAL | 0 refills | Status: DC | PRN

## 2019-01-27 MED FILL — BENZONATATE 100 MG CAP: 100 | 10 days supply | Qty: 30 | Fill #0 | Status: TO

## 2019-01-27 NOTE — Telephone Encounter (Signed)
Patient called asking for med for a cough that she has had for over a week she has tried over the counter medications and they aren't working.

## 2019-01-27 NOTE — Telephone Encounter (Signed)
I have sent Tessalon Perles to the pharmacy 

## 2019-02-06 MED FILL — SIMVASTATIN 10 MG TABLET: 10 | 30 days supply | Qty: 30 | Fill #3

## 2019-02-06 MED FILL — LINZESS 290 MCG CAPSULE: 290 | 30 days supply | Qty: 30 | Fill #2

## 2019-02-06 MED FILL — POTASSIUM CHLORIDE ER 10 ME: 10 | 30 days supply | Qty: 30 | Fill #5

## 2019-02-07 MED FILL — FLUTICASONE PROP 50 MCG SPR: 50 | 30 days supply | Qty: 16 | Fill #1

## 2019-02-20 ENCOUNTER — Telehealth: Payer: Self-pay | Admitting: Internal Medicine

## 2019-02-20 MED FILL — ESOMEPRAZOLE MAGNESIUM 20 M: 20 | 30 days supply | Qty: 30 | Fill #2

## 2019-02-20 MED FILL — AMLODIPINE BESYLATE 10 MG T: 10 | 30 days supply | Qty: 30 | Fill #1

## 2019-02-20 MED FILL — CHLORTHALIDONE 25 MG TAB: 25 | 30 days supply | Qty: 30 | Fill #1

## 2019-02-20 NOTE — Telephone Encounter (Signed)
Refills were available at the pharmacy, they have been submitted and the pharmacy will reach out for payment and shipping info.

## 2019-02-20 NOTE — Telephone Encounter (Signed)
New Message  1) Medication(s) Requested (by name): amLODipine (NORVASC) 10 MG tablet, chlorthalidone (HYGROTON) 25 MG tablet and pt's acis reflux pill she does not know the name  2) Pharmacy of Choice: [facility] on [address]  3) Special Requests:   Approved medications will be sent to the pharmacy, we will reach out if there is an issue.  Requests made after 3pm may not be addressed until the following business day!  If a patient is unsure of the name of the medication(s) please note and ask patient to call back when they are able to provide all info, do not send to responsible party until all information is available!

## 2019-02-25 ENCOUNTER — Telehealth: Payer: Self-pay | Admitting: Internal Medicine

## 2019-02-25 ENCOUNTER — Other Ambulatory Visit: Payer: Self-pay | Admitting: Family Medicine

## 2019-02-25 NOTE — Telephone Encounter (Signed)
Duplicate request

## 2019-02-25 NOTE — Telephone Encounter (Signed)
1) Medication(s) Requested (by name): benzonatate (TESSALON) 100 MG capsule   2) Pharmacy of Choice: [facility] on [address]   3) Special Requests:   Approved medications will be sent to the pharmacy, we will reach out if there is an issue.  Requests made after 3pm may not be addressed until the following business day!  If a patient is unsure of the name of the medication(s) please note and ask patient to call back when they are able to provide all info, do not send to responsible party until all information is available!

## 2019-03-10 ENCOUNTER — Other Ambulatory Visit: Payer: Self-pay

## 2019-03-10 ENCOUNTER — Ambulatory Visit: Payer: Medicare Other | Attending: Internal Medicine | Admitting: Internal Medicine

## 2019-03-10 DIAGNOSIS — H9201 Otalgia, right ear: Secondary | ICD-10-CM | POA: Diagnosis not present

## 2019-03-10 DIAGNOSIS — J029 Acute pharyngitis, unspecified: Secondary | ICD-10-CM | POA: Diagnosis not present

## 2019-03-10 DIAGNOSIS — I10 Essential (primary) hypertension: Secondary | ICD-10-CM | POA: Diagnosis not present

## 2019-03-10 DIAGNOSIS — E876 Hypokalemia: Secondary | ICD-10-CM

## 2019-03-10 DIAGNOSIS — R0982 Postnasal drip: Secondary | ICD-10-CM

## 2019-03-10 DIAGNOSIS — J301 Allergic rhinitis due to pollen: Secondary | ICD-10-CM

## 2019-03-10 MED ORDER — BLOOD PRESSURE MONITOR DEVI: 0 refills | Status: DC

## 2019-03-10 MED ORDER — AMLODIPINE BESYLATE 10 MG PO TABS: 10.0000 mg | ORAL_TABLET | Freq: Every day | ORAL | 6 refills | Status: DC

## 2019-03-10 MED ORDER — CHLORTHALIDONE 25 MG PO TABS: 25.0000 mg | ORAL_TABLET | Freq: Every day | ORAL | 6 refills | Status: DC

## 2019-03-10 MED ORDER — AZITHROMYCIN 250 MG PO TABS: ORAL_TABLET | ORAL | 0 refills | Status: DC

## 2019-03-10 NOTE — Progress Notes (Signed)
Pt states she still has a cough  Pt states her right ear aches

## 2019-03-10 NOTE — Progress Notes (Signed)
Virtual Visit via Telephone Note  I connected with [name] on 03/10/19 at 3:13 p.m EDT by telephone  from my office and verified that I am speaking with the correct person using two identifiers.  Patient is at her sister's house.  Only the patient and myself participated in this encounter.   I discussed the limitations, risks, security and privacy concerns of performing an evaluation and management service by telephone and the availability of in person appointments. I also discussed with the patient that there may be a patient responsible charge related to this service. The patient expressed understanding and agreed to proceed.   History of Present Illness: Pt with hx of HTN, chronic constipation,anal fistulas,vertigo, HL, chronic back pain (followed by pain specialist) and GERD  C/o drainage at back of the throat since last wk, associate with severe sore throat and intermittent pain in Rt ear.  No drainage from the ear.  Not sure of any fever.  Tried Halls and drying cold fluids but they do not help.  -no nasal congestion.  + itchy eyes and mild sneezing.  -taking Claritin and Flonase daily.  HYPERTENSION Currently taking: see medication list Med Adherence: [x]  Yes    []  No Medication side effects: []  Yes    [x]  No Adherence with salt restriction: [x]  Yes    []  No Home Monitoring?: no device to check BP Monitoring Frequency: []  Yes    []  No Home BP results range: []  Yes    []  No SOB? []  Yes    [x]  No Chest Pain?: []  Yes    [x]  No Leg swelling?: []  Yes    [x]  No Headaches?: [x]  Yes- frontal HA at times     Dizziness? []  Yes    []  No Comments: K low on ER visit 12/2018.      Observations/Objective:  Lab Results  Component Value Date   WBC 5.2 12/10/2018   HGB 14.6 12/10/2018   HCT 44.1 12/10/2018   MCV 88.7 12/10/2018   PLT 337 12/10/2018     Chemistry      Component Value Date/Time   NA 142 12/10/2018 1209   NA 141 03/12/2018 1152   K 2.9 (L) 12/10/2018 1209   CL  100 12/10/2018 1209   CO2 29 12/10/2018 1209   BUN 7 12/10/2018 1209   BUN 9 03/12/2018 1152   CREATININE 0.77 12/10/2018 1209      Component Value Date/Time   CALCIUM 10.0 12/10/2018 1209   ALKPHOS 79 10/23/2018 1100   AST 17 10/23/2018 1100   ALT 11 10/23/2018 1100   BILITOT 0.2 10/23/2018 1100      Assessment and Plan: 1. Essential hypertension Level of control unknown.  Patient is interested in having a home blood pressure monitoring device.  Prescription sent to the pharmacy.  Advised to check blood pressure at least once a week with goal being 130/80 or lower. - amLODipine (NORVASC) 10 MG tablet; Take 1 tablet (10 mg total) by mouth daily.  Dispense: 30 tablet; Refill: 6 - chlorthalidone (HYGROTON) 25 MG tablet; Take 1 tablet (25 mg total) by mouth daily.  Dispense: 30 tablet; Refill: 6 - Blood Pressure Monitor DEVI; Use as directed to check home blood pressure 2-3 times a week  Dispense: 1 Device; Refill: 0  2. Hypokalemia - Potassium; Future  3. Pharyngitis, unspecified etiology 4. Right ear pain We will treat empirically for strep.  Follow-up if no improvement in symptoms - azithromycin (ZITHROMAX) 250 MG tablet; 2 tabs  PO x 1 then 1 tab PO daily  Dispense: 6 tablet; Refill: 0  5. Postnasal drip 6. Seasonal allergic rhinitis due to pollen Continue Flonase nasal spray and Claritin   Follow Up Instructions: Follow-up in 3 months   I discussed the assessment and treatment plan with the patient. The patient was provided an opportunity to ask questions and all were answered. The patient agreed with the plan and demonstrated an understanding of the instructions.   The patient was advised to call back or seek an in-person evaluation if the symptoms worsen or if the condition fails to improve as anticipated.  I provided 19 minutes of non-face-to-face time during this encounter.   Jonah Blueeborah Johnson, MD

## 2019-03-12 ENCOUNTER — Ambulatory Visit: Payer: Medicare Other | Attending: Family Medicine

## 2019-03-12 ENCOUNTER — Other Ambulatory Visit: Payer: Self-pay

## 2019-03-12 DIAGNOSIS — E876 Hypokalemia: Secondary | ICD-10-CM

## 2019-03-13 ENCOUNTER — Telehealth: Payer: Self-pay | Admitting: Internal Medicine

## 2019-03-13 DIAGNOSIS — E876 Hypokalemia: Secondary | ICD-10-CM

## 2019-03-13 LAB — POTASSIUM: Potassium: 2.8 mmol/L — ABNORMAL LOW (ref 3.5–5.2)

## 2019-03-13 MED ORDER — SPIRONOLACTONE 25 MG PO TABS: 12.5000 mg | ORAL_TABLET | Freq: Every day | ORAL | 3 refills | Status: DC

## 2019-03-13 NOTE — Telephone Encounter (Signed)
PC placed to pt today.  Pt advised that K+ level is still low at 2.8 with nl range being 3.5-5.  Pt informed that she seems to be very sensitive to the effects of Chlorthalidone which can cause K+ loss in the urine.  Pt given option of either increasing K+ supplement dose vs d/c Chlorthalidone and change to different BP lowering med.  Pt opted for the latter.   Pt advise to take Kdur 10 mg, 4 tabs today then d/c.  D/c Chlorthalidone.  Start Spironolactone 12,5 mg instead.  Rxn sent to her pharmacy.  Return to lab for BMP once she has been on Spironolactone for 1 wk. Pt was able to repeat back instructions to me.

## 2019-03-16 ENCOUNTER — Encounter: Payer: Self-pay | Admitting: Internal Medicine

## 2019-03-16 ENCOUNTER — Ambulatory Visit: Payer: Medicare Other | Attending: Internal Medicine | Admitting: Internal Medicine

## 2019-03-16 VITALS — BP 114/73 | HR 73 | Temp 98.0°F | Resp 16 | Wt 151.4 lb

## 2019-03-16 DIAGNOSIS — J029 Acute pharyngitis, unspecified: Secondary | ICD-10-CM | POA: Diagnosis not present

## 2019-03-16 DIAGNOSIS — R42 Dizziness and giddiness: Secondary | ICD-10-CM | POA: Diagnosis not present

## 2019-03-16 DIAGNOSIS — R0982 Postnasal drip: Secondary | ICD-10-CM

## 2019-03-16 DIAGNOSIS — R1031 Right lower quadrant pain: Secondary | ICD-10-CM | POA: Diagnosis not present

## 2019-03-16 LAB — POCT RAPID STREP A (OFFICE): Rapid Strep A Screen: NEGATIVE

## 2019-03-16 NOTE — Progress Notes (Signed)
Patient ID: [name], female    DOB: 1966-05-20  MRN: [id number]  CC:  Sore throat  Subjective:  [name] is a 53 y.o. female who presents for UC visit. Her concerns today include:  Pt with hx of HTN, chronic constipation,anal fistulas,vertigo, HL, chronic back pain (followed by pain specialist)and GERD  We did a telemetry visit with her on 03/10/2019.  Patient c/o drainage at back of the throat , associate with severe sore throat and intermittent pain in Rt ear.  Not sure of any fever.  Tried Halls and drying cold fluids but they do not help. no nasal congestion.  + itchy eyes and mild sneezing.  She was taking Claritin and Flonase daily.  She was diagnosed with pharyngitis, postnasal drip and seasonal allergies.  She was treated empirically with azithromycin and told to continue Flonase and Claritin.  Today: Patient reports that she felt tired and dizzy 2 days ago.  Vomited x 1 that day.  No diarrhea.  Sharp pain at times  RLQ yesterday but not today. No dysuria or abnl vaginal discharge +Sore throat that went away with abx but them came back.  No dysphasia.  No fever.  +dry cough.  She states that she still has a lot of drainage at the back of the throat which makes her feel like she wants to throw up.  She is using the Flonase and Claritin as recommended. Feels like she has to take a deep breath at times to prevent throat from closing.     Patient Active Problem List   Diagnosis Date Noted  . Altered consciousness 12/10/2018  . Carpal tunnel syndrome of right wrist 06/12/2018  . Postnasal drip 06/12/2018  . Vertigo 10/18/2016  . Right arm pain 04/19/2014  . Disturbance of skin sensation 04/19/2014  . Anal fistula 04/14/2013  . UNSPECIFIED SITE OF SPRAIN AND STRAIN 07/07/2009  . PEDAL EDEMA 06/29/2009  . MUSCLE PAIN 06/23/2009  . 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 ALLERGIC RHINITIS 03/11/2009  . SYNCOPE, VASOVAGAL 03/11/2009  . HEADACHE 03/11/2009     Current Outpatient  Medications on File Prior to Visit  Medication Sig Dispense Refill  . amLODipine (NORVASC) 10 MG tablet Take 1 tablet (10 mg total) by mouth daily. 30 tablet 6  . azithromycin (ZITHROMAX) 250 MG tablet 2 tabs PO x 1 then 1 tab PO daily 6 tablet 0  . Blood Pressure Monitor DEVI Use as directed to check home blood pressure 2-3 times a week 1 Device 0  . cetirizine (ZYRTEC) 10 MG tablet Take 1 tablet (10 mg total) by mouth daily. 30 tablet 11  . DULoxetine (CYMBALTA) 30 MG capsule Take 1 capsule by mouth at bedtime.    Marland Kitchen. esomeprazole (NEXIUM) 20 MG capsule Take 1 capsule (20 mg total) by mouth 2 (two) times daily before a meal. (Patient not taking: Reported on 12/10/2018) 60 capsule 5  . fluticasone (FLONASE) 50 MCG/ACT nasal spray Place 2 sprays into both nostrils daily. 16 g 6  . LINZESS 290 MCG CAPS capsule TAKE 1 CAPSULE BY MOUTH DAILY BEFORE BREAKFAST. 30 capsule 3  . loratadine (CLARITIN) 10 MG tablet Take 10 mg by mouth daily.    . meclizine (ANTIVERT) 25 MG tablet Take 1 tablet (25 mg total) by mouth 3 (three) times daily as needed for dizziness. 30 tablet 0  . mometasone (NASONEX) 50 MCG/ACT nasal spray Place 1 spray into the nose daily. (Patient not taking: Reported on 12/10/2018) 17 g 12  .  oxycodone (OXY-IR) 5 MG capsule Take 5 mg by mouth every 8 (eight) hours as needed.    . simvastatin (ZOCOR) 10 MG tablet Take 1 tablet (10 mg total) by mouth at bedtime. 30 tablet 3  . spironolactone (ALDACTONE) 25 MG tablet Take 0.5 tablets (12.5 mg total) by mouth daily. 30 tablet 3   No current facility-administered medications on file prior to visit.     Allergies  Allergen Reactions  . Ampicillin Anaphylaxis  . Atorvastatin Calcium Other (See Comments)    Pt reported lip swelling  . Penicillins Anaphylaxis  . Sulfonamide Derivatives Anaphylaxis  . Hydrocodone-Acetaminophen Hives    Have to take a Benadryl to take med  . Tramadol Itching  . Tylenol [Acetaminophen] Itching     ROS:  Review of Systems Negative except as above  PHYSICAL EXAM: BP 114/73   Pulse 73   Temp 98 F (36.7 C) (Oral)   Resp 16   Wt 151 lb 6.4 oz (68.7 kg)   SpO2 98%   BMI 25.99 kg/m   BP 112/78 P 73  sitting  BP 113/78, P 76 standing  Physical Exam  General appearance - alert, well appearing, and in no distress Mental status - normal mood, behavior, speech, dress, motor activity, and thought processes Eyes -slightly pale conjunctiva  ears -right ear canal and tympanic membrane within normal limits.  Small amount of wax buildup at the opening of the left ear canal obscuring view of the tympanic membrane  nose -small amount of clear mucus in both nostrils.  Turbinates are not enlarged Mouth -oral mucosa is moist.  Throat is not erythematous.  No exudates noted  neck -no cervical lymphadenopathy Chest - clear to auscultation, no wheezes, rales or rhonchi, symmetric air entry Heart - normal rate, regular rhythm, normal S1, S2, no murmurs, rubs, clicks or gallops Abdomen -normal bowel sounds, nondistended, soft, nontender Neurologic exam: Nonfocal Results for orders placed or performed in visit on 03/16/19  POCT rapid strep A  Result Value Ref Range   Rapid Strep A Screen Negative Negative    ASSESSMENT AND PLAN: 1. Sore throat 2. Post-nasal drip Given that symptoms persist, will refer to ENT - POCT rapid strep A - Ambulatory referral to ENT  2. Post-nasal drip - Ambulatory referral to ENT  3. Dizziness Encouraged her to drink adequate fluids during the day - CBC With Differential  4. Right lower quadrant abdominal pain This has resolved yesterday, but patient told that should this recur especially with vomiting she should be seen in the emergency room to make sure this is not appendicitis    Patient was given the opportunity to ask questions.  Patient verbalized understanding of the plan and was able to repeat key elements of the plan.   Orders Placed This Encounter   Procedures  . POCT rapid strep A     Requested Prescriptions    No prescriptions requested or ordered in this encounter    Future Appointments  Date Time Provider Department Center  06/11/2019  2:10 PM Marcine Matar, MD [id number] None    Jonah Blue, MD, FACP

## 2019-03-16 NOTE — Patient Instructions (Signed)
We have referred you to an ENT specialist for the persistent sore throat and postnasal drip symptoms.  Drink fluids to keep yourself hydrated. If the right lower quadrant abdominal pain returns you should be seen in the emergency room to rule out appendicitis.

## 2019-03-16 NOTE — Progress Notes (Signed)
Pt states she threw up Saturday  Pt states she has been sick since last week   Pt states she has been having sob lately  Pt states it is hard for her to swallow  Pt states she be breathing like she can't catch her breath  Pt states she can't eat

## 2019-03-17 LAB — CBC WITH DIFFERENTIAL
Basophils Absolute: 0.1 10*3/uL (ref 0.0–0.2)
Basos: 1 %
EOS (ABSOLUTE): 0.1 10*3/uL (ref 0.0–0.4)
Eos: 2 %
Hematocrit: 39.2 % (ref 34.0–46.6)
Hemoglobin: 13.2 g/dL (ref 11.1–15.9)
Immature Grans (Abs): 0 10*3/uL (ref 0.0–0.1)
Immature Granulocytes: 0 %
Lymphocytes Absolute: 2.9 10*3/uL (ref 0.7–3.1)
Lymphs: 51 %
MCH: 29.1 pg (ref 26.6–33.0)
MCHC: 33.7 g/dL (ref 31.5–35.7)
MCV: 87 fL (ref 79–97)
Monocytes Absolute: 0.4 10*3/uL (ref 0.1–0.9)
Monocytes: 7 %
Neutrophils Absolute: 2.2 10*3/uL (ref 1.4–7.0)
Neutrophils: 39 %
RBC: 4.53 x10E6/uL (ref 3.77–5.28)
RDW: 12.9 % (ref 11.7–15.4)
WBC: 5.7 10*3/uL (ref 3.4–10.8)

## 2019-03-18 ENCOUNTER — Telehealth: Payer: Self-pay

## 2019-03-18 NOTE — Telephone Encounter (Signed)
Contacted pt to go over lab results pt is aware and doesn't have any questions or concerns 

## 2019-03-24 ENCOUNTER — Other Ambulatory Visit: Payer: Self-pay

## 2019-03-24 ENCOUNTER — Ambulatory Visit: Payer: Medicare Other | Attending: Internal Medicine

## 2019-03-24 DIAGNOSIS — E876 Hypokalemia: Secondary | ICD-10-CM

## 2019-03-25 LAB — BASIC METABOLIC PANEL
BUN/Creatinine Ratio: 15 (ref 9–23)
BUN: 11 mg/dL (ref 6–24)
CO2: 26 mmol/L (ref 20–29)
Calcium: 9.5 mg/dL (ref 8.7–10.2)
Chloride: 100 mmol/L (ref 96–106)
Creatinine, Ser: 0.72 mg/dL (ref 0.57–1.00)
GFR calc Af Amer: 110 mL/min/{1.73_m2} (ref >59)
GFR calc non Af Amer: 95 mL/min/{1.73_m2} (ref >59)
Glucose: 83 mg/dL (ref 65–99)
Potassium: 4.6 mmol/L (ref 3.5–5.2)
Sodium: 140 mmol/L (ref 134–144)

## 2019-04-01 ENCOUNTER — Emergency Department (HOSPITAL_COMMUNITY)
Admission: EM | Admit: 2019-04-01 | Discharge: 2019-04-01 | Disposition: A | Discharge status: Home / Self Care | Payer: Medicare Other | Attending: Emergency Medicine | Admitting: Emergency Medicine

## 2019-04-01 ENCOUNTER — Ambulatory Visit (HOSPITAL_BASED_OUTPATIENT_CLINIC_OR_DEPARTMENT_OTHER): Payer: Medicare Other

## 2019-04-01 ENCOUNTER — Encounter (HOSPITAL_COMMUNITY): Payer: Self-pay

## 2019-04-01 ENCOUNTER — Other Ambulatory Visit: Payer: Self-pay

## 2019-04-01 ENCOUNTER — Telehealth: Payer: Self-pay

## 2019-04-01 DIAGNOSIS — M79652 Pain in left thigh: Secondary | ICD-10-CM | POA: Insufficient documentation

## 2019-04-01 DIAGNOSIS — I10 Essential (primary) hypertension: Secondary | ICD-10-CM | POA: Diagnosis not present

## 2019-04-01 DIAGNOSIS — Z79899 Other long term (current) drug therapy: Secondary | ICD-10-CM | POA: Diagnosis not present

## 2019-04-01 DIAGNOSIS — M5136 Other intervertebral disc degeneration, lumbar region: Secondary | ICD-10-CM | POA: Diagnosis not present

## 2019-04-01 DIAGNOSIS — M79605 Pain in left leg: Secondary | ICD-10-CM

## 2019-04-01 DIAGNOSIS — R609 Edema, unspecified: Secondary | ICD-10-CM

## 2019-04-01 DIAGNOSIS — M79609 Pain in unspecified limb: Secondary | ICD-10-CM | POA: Diagnosis not present

## 2019-04-01 DIAGNOSIS — M25562 Pain in left knee: Secondary | ICD-10-CM | POA: Diagnosis not present

## 2019-04-01 LAB — BASIC METABOLIC PANEL
Anion gap: 9 (ref 5–15)
BUN: 7 mg/dL (ref 6–20)
CO2: 25 mmol/L (ref 22–32)
Calcium: 9.2 mg/dL (ref 8.9–10.3)
Chloride: 104 mmol/L (ref 98–111)
Creatinine, Ser: 0.67 mg/dL (ref 0.44–1.00)
GFR calc Af Amer: >60 mL/min (ref >60)
GFR calc non Af Amer: >60 mL/min (ref >60)
Glucose, Bld: 93 mg/dL (ref 70–99)
Potassium: 4.2 mmol/L (ref 3.5–5.1)
Sodium: 138 mmol/L (ref 135–145)

## 2019-04-01 LAB — CBC WITH DIFFERENTIAL/PLATELET
Abs Immature Granulocytes: 0.01 10*3/uL (ref 0.00–0.07)
Basophils Absolute: 0.1 10*3/uL (ref 0.0–0.1)
Basophils Relative: 1 %
Eosinophils Absolute: 0.1 10*3/uL (ref 0.0–0.5)
Eosinophils Relative: 2 %
HCT: 39.6 % (ref 36.0–46.0)
Hemoglobin: 12.8 g/dL (ref 12.0–15.0)
Immature Granulocytes: 0 %
Lymphocytes Relative: 39 %
Lymphs Abs: 2.2 10*3/uL (ref 0.7–4.0)
MCH: 29.5 pg (ref 26.0–34.0)
MCHC: 32.3 g/dL (ref 30.0–36.0)
MCV: 91.2 fL (ref 80.0–100.0)
Monocytes Absolute: 0.4 10*3/uL (ref 0.1–1.0)
Monocytes Relative: 7 %
Neutro Abs: 2.8 10*3/uL (ref 1.7–7.7)
Neutrophils Relative %: 51 %
Platelets: 264 10*3/uL (ref 150–400)
RBC: 4.34 MIL/uL (ref 3.87–5.11)
RDW: 13.3 % (ref 11.5–15.5)
WBC: 5.5 10*3/uL (ref 4.0–10.5)
nRBC: 0 % (ref 0.0–0.2)

## 2019-04-01 MED ORDER — DICLOFENAC SODIUM 1 % TD GEL: 2.0000 g | Freq: Four times a day (QID) | TRANSDERMAL | 0 refills | Status: DC

## 2019-04-01 MED ORDER — OXYCODONE HCL 5 MG PO TABS
5.0000 mg | ORAL_TABLET | Freq: Once | ORAL | Status: AC
  Administered 2019-04-01: 5 mg via ORAL
  Filled 2019-04-01: qty 1

## 2019-04-01 NOTE — Discharge Instructions (Addendum)
Please see the information and instructions below regarding your visit.  Your diagnoses today include:  1. Left leg pain     Tests performed today include: See side panel of your discharge paperwork for testing performed today. Vital signs are listed at the bottom of these instructions.   Your work-up is very reassuring today.  Your ultrasound showed no evidence of blood clot.  Your lab work was normal.  Medications prescribed:    Take any prescribed medications only as prescribed, and any over the counter medications only as directed on the packaging.  I recommend taking diclofenac gel and applying it to the areas of discomfort.  This may be applied up to 4 times daily.  Home care instructions:  Please follow any educational materials contained in this packet.   Please apply heat to the area.  Follow-up instructions: Please follow-up with your primary care provider in one week for further evaluation of your symptoms if they are not completely improved.   If you are still having persistent symptoms, a very small proportion of blood clots may not show up on the first ultrasound and need to be evaluated at a later point.  Return instructions:  Please return to the Emergency Department if you experience worsening symptoms.  Please return the emergency department if you develop any worsening pain, swelling or change in color of the extremity. Please return if you have any other emergent concerns.  Additional Information:   Your vital signs today were: BP 139/83 (BP Location: Right Arm)    Pulse 90    Temp 98.3 F (36.8 C) (Oral)    Resp 16    Ht 5\' 4"  (1.626 m)    Wt 65.8 kg    SpO2 100%    BMI 24.89 kg/m  If your blood pressure (BP) was elevated on multiple readings during this visit above 130 for the top number or above 80 for the bottom number, please have this repeated by your primary care provider within one month. --------------  Thank you for allowing [place] to participate in  your care today.

## 2019-04-01 NOTE — Progress Notes (Signed)
Lower extremity venous has been completed.   Preliminary results in CV Proc.   Blanch Media 04/01/2019 11:35 AM

## 2019-04-01 NOTE — ED Provider Notes (Signed)
[facility] Note   CSN: [id number] Arrival date & time: 04/01/19  1022    History   Chief Complaint Chief Complaint  Patient presents with  . L thigh pain  . Leg Swelling    HPI [name] is a 53 y.o. female.     HPI  Patient is a 53 year old female with a past medical history of hypertension, hyperlipidemia, degenerative disc disease, anemia presenting for left thigh and popliteal pain.  Patient reports that it began 3 days ago.  She felt that it developed suddenly over the course of the day.  Patient denies any erythema, warmth, or significant swelling to the leg.  She denies any loss of sensation distally.  Patient denies any chest pain or shortness of breath.  She denies any history of DVT/PE, hormone use, cancer treatment, recent immobilization, hospitalization, recent surgery.  She denies any fever or chills.  Patient does have a history of degenerative disc disease for which she sees a pain management physician for.  She was sent to the emergency department to rule out DVT.  Past Medical History:  Diagnosis Date  . 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 Anemia   . Constipation   . GERD (gastroesophageal reflux disease)   . Hemorrhoids   . HTN (hypertension)   . Hyperlipidemia   . Seasonal allergies 08-13-13   tx. Claritin  . Vertigo     Patient Active Problem List   Diagnosis Date Noted  . Altered consciousness 12/10/2018  . Carpal tunnel syndrome of right wrist 06/12/2018  . Postnasal drip 06/12/2018  . Vertigo 10/18/2016  . Right arm pain 04/19/2014  . Disturbance of skin sensation 04/19/2014  . Anal fistula 04/14/2013  . UNSPECIFIED SITE OF SPRAIN AND STRAIN 07/07/2009  . PEDAL EDEMA 06/29/2009  . MUSCLE PAIN 06/23/2009  . 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 ALLERGIC RHINITIS 03/11/2009  . SYNCOPE, VASOVAGAL 03/11/2009  . HEADACHE 03/11/2009    Past Surgical History:  Procedure Laterality Date  . ANAL FISTULECTOMY   08/22/2012   Procedure: FISTULECTOMY ANAL;  Surgeon: Lodema PilotBrian Layton, DO;  Location: [facility];  Service: General;  Laterality: N/A;  rectal examination under anesthesia with seton placement   . ANAL FISTULECTOMY  09/17/2012   Procedure: FISTULECTOMY ANAL;  Surgeon: Lodema PilotBrian Layton, DO;  Location: [facility];  Service: General;  Laterality: N/A;  possible fistulotomy  . D & C HYSTEROSCOPY/ NOVASURE ENDOMETRIAL ABLATION/ I & D THROMBOSED HEMORROID  01-10-2007  DR Miguel AschoffALLAN ROSS  . EXAMINATION UNDER ANESTHESIA  09/17/2012   Procedure: EXAM UNDER ANESTHESIA;  Surgeon: Lodema PilotBrian Layton, DO;  Location: [facility];  Service: General;  Laterality: N/A;  Rectal exam under anesthesia  . EXAMINATION UNDER ANESTHESIA N/A 05/14/2013   Procedure: EXAM UNDER ANESTHESIA;  Surgeon: Romie LeveeAlicia Thomas, MD;  [facility];  Service: General;  Laterality: N/A;  . FISTULA PLUG  09/17/2012   Procedure: FISTULA PLUG;  Surgeon: Lodema PilotBrian Layton, DO;  Location: [facility];  Service: General;  Laterality: N/A;  Possible fistula plug  . HEMORRHOID SURGERY  04-28-2009   [facility] INTERNAL HEMORRHOIDS  . IRRIGATION AND DEBRIDEMENT ABSCESS  07/08/2012   Procedure: MINOR INCISION AND DRAINAGE OF ABSCESS;  Surgeon: Geryl RankinsEvelyn Varnado, MD;  [facility];  Service: Gynecology;  Laterality: N/A;  Vulvar Abscess  . PLACEMENT OF SETON N/A 05/14/2013   Procedure:  PLACEMENT OF SETON;  Surgeon: Romie LeveeAlicia Thomas, MD;  Location: [facility];  Service: General;  Laterality: N/A;  . RECTAL EXAM UNDER ANESTHESIA N/A 08/20/2013   Procedure: mucosal advancement flap ;  Surgeon: Romie Levee, MD;  Location: [facility];  Service: General;  Laterality: N/A;  parks anal retractor long rectal instrucments prone jack knife anal fistula   . RECTAL ULTRASOUND N/A 07/20/2013   Procedure: RECTAL ULTRASOUND;  Surgeon: Romie Levee, MD;  Location: [facility];  Service: Endoscopy;   Laterality: N/A;     OB History   No obstetric history on file.      Home Medications    Prior to Admission medications   Medication Sig Start Date End Date Taking? Authorizing Provider  amLODipine (NORVASC) 10 MG tablet Take 1 tablet (10 mg total) by mouth daily. 03/10/19   Marcine Matar, MD  azithromycin (ZITHROMAX) 250 MG tablet 2 tabs PO x 1 then 1 tab PO daily 03/10/19   Marcine Matar, MD  Blood Pressure Monitor DEVI Use as directed to check home blood pressure 2-3 times a week 03/10/19   Marcine Matar, MD  cetirizine (ZYRTEC) 10 MG tablet Take 1 tablet (10 mg total) by mouth daily. 12/23/18   Claiborne Rigg, NP  DULoxetine (CYMBALTA) 30 MG capsule Take 1 capsule by mouth at bedtime.    [provider]  esomeprazole (NEXIUM) 20 MG capsule Take 1 capsule (20 mg total) by mouth 2 (two) times daily before a meal. Patient not taking: Reported on 12/10/2018 06/12/18   Marcine Matar, MD  fluticasone [facility]) 50 MCG/ACT nasal spray Place 2 sprays into both nostrils daily. 12/23/18   Claiborne Rigg, NP  LINZESS 290 MCG CAPS capsule TAKE 1 CAPSULE BY MOUTH DAILY BEFORE BREAKFAST. 11/18/18   Hilarie Fredrickson, MD  loratadine (CLARITIN) 10 MG tablet Take 10 mg by mouth daily.    [provider]  meclizine (ANTIVERT) 25 MG tablet Take 1 tablet (25 mg total) by mouth 3 (three) times daily as needed for dizziness. 12/10/18   Alvira Monday, MD  oxycodone (OXY-IR) 5 MG capsule Take 5 mg by mouth every 8 (eight) hours as needed.    [provider]  simvastatin (ZOCOR) 10 MG tablet Take 1 tablet (10 mg total) by mouth at bedtime. 10/27/18   Hoy Register, MD  spironolactone (ALDACTONE) 25 MG tablet Take 0.5 tablets (12.5 mg total) by mouth daily. 03/13/19   Marcine Matar, MD    Family History Family History  Problem Relation Age of Onset  . Diabetes Mother   . Heart attack Father   . Diabetes Brother   . Diabetes Maternal Uncle   . Diabetes Maternal  Aunt   . Seizures Sister   . Colon cancer Neg Hx   . Stomach cancer Neg Hx     Social History Social History   Tobacco Use  . Smoking status: Never Smoker  . Smokeless tobacco: Never Used  Substance Use Topics  . Alcohol use: Yes    Alcohol/week: 0.0 standard drinks    Comment: occa  . Drug use: No     Allergies   Ampicillin; Atorvastatin calcium; Penicillins; Sulfonamide derivatives; Hydrocodone-acetaminophen; Tramadol; and Tylenol [acetaminophen]   Review of Systems Review of Systems  Constitutional: Negative for chills and fever.  HENT: Negative for congestion and sore throat.   Eyes: Negative for visual disturbance.  Respiratory: Negative for chest tightness and shortness of breath.   Cardiovascular: Negative for chest pain, palpitations and leg swelling.  Gastrointestinal: Negative for abdominal pain, nausea  and vomiting.  Genitourinary: Negative for dysuria and flank pain.  Musculoskeletal: Positive for myalgias. Negative for back pain.  Skin: Negative for color change and rash.  Neurological: Negative for dizziness and light-headedness.     Physical Exam Updated Vital Signs BP 139/83 (BP Location: Right Arm)   Pulse 90   Temp 98.3 F (36.8 C) (Oral)   Resp 16   Ht 5\' 4"  (1.626 m)   Wt 65.8 kg   SpO2 100%   BMI 24.89 kg/m   Physical Exam Vitals signs and nursing note reviewed.  Constitutional:      General: She is not in acute distress.    Appearance: She is well-developed.  HENT:     Head: Normocephalic and atraumatic.  Eyes:     Conjunctiva/sclera: Conjunctivae normal.     Pupils: Pupils are equal, round, and reactive to light.  Neck:     Musculoskeletal: Normal range of motion and neck supple.  Cardiovascular:     Rate and Rhythm: Normal rate and regular rhythm.     Heart sounds: S1 normal and S2 normal. No murmur.  Pulmonary:     Effort: Pulmonary effort is normal.     Breath sounds: Normal breath sounds. No wheezing or rales.   Abdominal:     General: There is no distension.     Palpations: Abdomen is soft.  Musculoskeletal: Normal range of motion.        General: No deformity.     Right lower leg: No edema.     Left lower leg: No edema.     Comments: Color of the bilateral lower extremities are symmetric. No pallor or erythema. Bilateral lower extremities are warm.  Patient has tenderness to palpation just proximal to the left popliteal region and extending to the medial thigh.  There is no swelling, erythema, induration or fluctuance.  Patient has full flexion-extension of the left lower extremity.  Patient has intact, 2+ and equal DP pulses bilaterally.  Skin:    General: Skin is warm and dry.     Findings: No erythema or rash.  Neurological:     Mental Status: She is alert.     Comments: Cranial nerves grossly intact. Patient moves extremities symmetrically and with good coordination.  Psychiatric:        Behavior: Behavior normal.        Thought Content: Thought content normal.        Judgment: Judgment normal.      ED Treatments / Results  Labs (all labs ordered are listed, but only abnormal results are displayed) Labs Reviewed  BASIC METABOLIC PANEL  CBC WITH DIFFERENTIAL/PLATELET    EKG None  Radiology Vas [place] Lower Extremity Venous (dvt) (only [facility])  Result Date: 04/01/2019  Lower Venous Study Indications: Edema, and Pain.  Performing Technologist: Blanch Media RVS  Examination Guidelines: A complete evaluation includes B-mode imaging, spectral Doppler, color Doppler, and power Doppler as needed of all accessible portions of each vessel. Bilateral testing is considered an integral part of a complete examination. Limited examinations for reoccurring indications may be performed as noted.  +-----+---------------+---------+-----------+----------+-------+ RIGHTCompressibilityPhasicitySpontaneityPropertiesSummary +-----+---------------+---------+-----------+----------+-------+ CFV  Full            Yes      Yes                          +-----+---------------+---------+-----------+----------+-------+   +---------+---------------+---------+-----------+----------+-------+ LEFT     CompressibilityPhasicitySpontaneityPropertiesSummary +---------+---------------+---------+-----------+----------+-------+ CFV  Full           Yes      Yes                          +---------+---------------+---------+-----------+----------+-------+ SFJ      Full                                                 +---------+---------------+---------+-----------+----------+-------+ FV Prox  Full                                                 +---------+---------------+---------+-----------+----------+-------+ FV Mid   Full                                                 +---------+---------------+---------+-----------+----------+-------+ FV DistalFull                                                 +---------+---------------+---------+-----------+----------+-------+ PFV      Full                                                 +---------+---------------+---------+-----------+----------+-------+ POP      Full           Yes      Yes                          +---------+---------------+---------+-----------+----------+-------+ PTV      Full                                                 +---------+---------------+---------+-----------+----------+-------+ PERO     Full                                                 +---------+---------------+---------+-----------+----------+-------+     Summary: Right: No evidence of common femoral vein obstruction. Left: There is no evidence of deep vein thrombosis in the lower extremity. No cystic structure found in the popliteal fossa.  *See table(s) above for measurements and observations.    Preliminary     Procedures Procedures (including critical care time)  Medications Ordered in ED Medications  oxyCODONE  (Oxy IR/ROXICODONE) immediate release tablet 5 mg (5 mg Oral Given 04/01/19 1145)     Initial Impression / Assessment and Plan / ED Course  I have reviewed the triage vital signs and the nursing notes.  Pertinent labs & imaging results that were available during my care of the patient were reviewed by me and considered in  my medical decision making (see chart for details).  Clinical Course as of Mar 31 1208  Wed Apr 01, 2019  1122 Patient verbally verified a safe ride from the ED. Proceeded with prescribing Roxicodone for pain/relaxtion/muscle relaxation in the ED.    [AM]    Clinical Course User Index [AM] Elisha Ponder, PA-C       This is a well-appearing 53 year old female with past medical history ofhypertension, hyperlipidemia, degenerative disc disease, anemia presenting for left thigh and popliteal pain.  She is nontoxic-appearing, afebrile, non-tachycardic and in no acute distress.  Differential diagnosis includes DVT, musculoskeletal pain, infection, radiculopathy.  There are no external signs or symptoms of cellulitis, and there is no pain out of proportion on palpation of the leg, therefore doubt infection.  Patient has normal blood counts and electrolytes.  DVT study is negative.  Suspect radiculopathy versus musculoskeletal strain.  Patient is already on narcotic pain medication for her degenerative disc disease through a pain clinic and will add topical NSAID.  She is instructed to follow-up with primary care, and discussed that if she does have persistent symptoms, she may require a second ultrasound.  Return precautions given for any worsening pain, swelling, or change in color of the left lower extremity.  Patient is in understanding and agrees with the plan of care.  Final Clinical Impressions(s) / ED Diagnoses   Final diagnoses:  Left leg pain    ED Discharge Orders         Ordered    diclofenac sodium (VOLTAREN) 1 % GEL  4 times daily     04/01/19 1245            Delia Chimes 04/01/19 1246    Geoffery Lyons, MD 04/01/19 1447

## 2019-04-01 NOTE — ED Notes (Signed)
Patient verbalizes understanding of discharge instructions. Opportunity for questioning and answers were provided. Armband removed by staff, pt discharged from ED ambulatory to home.  

## 2019-04-01 NOTE — Telephone Encounter (Signed)
Contacted pt to go over lab results pt is aware and doesn't have any questions or concerns 

## 2019-04-01 NOTE — ED Triage Notes (Signed)
Pt in with c/o L posterior thigh pain and swelling x 4 days. States her MD sent her here for eval for possible blood clot. Denies any discoloration of extremity, injury or inability to bear weight.

## 2019-04-03 ENCOUNTER — Encounter: Payer: Self-pay | Admitting: Internal Medicine

## 2019-04-03 ENCOUNTER — Other Ambulatory Visit: Payer: Self-pay

## 2019-04-03 ENCOUNTER — Ambulatory Visit: Payer: Medicare Other | Attending: Internal Medicine | Admitting: Internal Medicine

## 2019-04-03 VITALS — BP 140/85 | HR 84 | Temp 97.8°F | Resp 16 | Wt 153.6 lb

## 2019-04-03 DIAGNOSIS — M79605 Pain in left leg: Secondary | ICD-10-CM | POA: Diagnosis not present

## 2019-04-03 DIAGNOSIS — K219 Gastro-esophageal reflux disease without esophagitis: Secondary | ICD-10-CM

## 2019-04-03 DIAGNOSIS — E782 Mixed hyperlipidemia: Secondary | ICD-10-CM

## 2019-04-03 MED ORDER — SIMVASTATIN 10 MG PO TABS: 10.0000 mg | ORAL_TABLET | Freq: Every day | ORAL | 6 refills | Status: DC

## 2019-04-03 MED ORDER — LORATADINE 10 MG PO TABS: 10.0000 mg | ORAL_TABLET | Freq: Every day | ORAL | 3 refills | Status: DC

## 2019-04-03 MED ORDER — ESOMEPRAZOLE MAGNESIUM 20 MG PO CPDR: 20.0000 mg | DELAYED_RELEASE_CAPSULE | Freq: Every day | ORAL | 2 refills | Status: DC

## 2019-04-03 MED ORDER — KETOROLAC TROMETHAMINE 30 MG/ML IJ SOLN
30.0000 mg | Freq: Once | INTRAMUSCULAR | Status: AC
  Administered 2019-04-03: 30 mg via INTRAMUSCULAR

## 2019-04-03 NOTE — Progress Notes (Signed)
Patient ID: [name], female    DOB: 01-22-1966  MRN: [id number]  CC:  ER f/u appt  Subjective:  [name] is a 53 y.o. female who presents for UC visit. Her concerns today include:  Pt with hx of HTN, chronic constipation,anal fistulas,vertigo, HL, chronic back pain (followed by pain specialist)and GERD  Pt seen in ER 04/01/2019 for LT thigh pain.  Doppler ultrasound was negative for DVT.  Thought to be musculoskeletal.  She was prescribed some Voltaren gel which she states helps a little. Today she tells me that she has been having swelling and hurting in medial aspect L thigh and knee x 4 days -no initiating factors -pain intermittent and worse at nights, last 10-15 mins.  Does not feel like spasms.  No skin changes. No numbness or tingling  Needs RF on Simvastatin, Claritin and Nexium.,.  Agreeable to reduction in Nexuim dose to once a day  Patient Active Problem List   Diagnosis Date Noted  . Altered consciousness 12/10/2018  . Carpal tunnel syndrome of right wrist 06/12/2018  . Postnasal drip 06/12/2018  . Vertigo 10/18/2016  . Right arm pain 04/19/2014  . Disturbance of skin sensation 04/19/2014  . Anal fistula 04/14/2013  . UNSPECIFIED SITE OF SPRAIN AND STRAIN 07/07/2009  . PEDAL EDEMA 06/29/2009  . MUSCLE PAIN 06/23/2009  . 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 ALLERGIC RHINITIS 03/11/2009  . SYNCOPE, VASOVAGAL 03/11/2009  . HEADACHE 03/11/2009     Current Outpatient Medications on File Prior to Visit  Medication Sig Dispense Refill  . amLODipine (NORVASC) 10 MG tablet Take 1 tablet (10 mg total) by mouth daily. 30 tablet 6  . Blood Pressure Monitor DEVI Use as directed to check home blood pressure 2-3 times a week 1 Device 0  . cetirizine (ZYRTEC) 10 MG tablet Take 1 tablet (10 mg total) by mouth daily. 30 tablet 11  . diclofenac sodium (VOLTAREN) 1 % GEL Apply 2 g topically 4 (four) times daily. 100 g 0  . DULoxetine (CYMBALTA) 30 MG capsule Take 1 capsule  by mouth at bedtime.    . fluticasone (FLONASE) 50 MCG/ACT nasal spray Place 2 sprays into both nostrils daily. 16 g 6  . LINZESS 290 MCG CAPS capsule TAKE 1 CAPSULE BY MOUTH DAILY BEFORE BREAKFAST. 30 capsule 3  . meclizine (ANTIVERT) 25 MG tablet Take 1 tablet (25 mg total) by mouth 3 (three) times daily as needed for dizziness. 30 tablet 0  . oxycodone (OXY-IR) 5 MG capsule Take 5 mg by mouth every 8 (eight) hours as needed.    Marland Kitchen. spironolactone (ALDACTONE) 25 MG tablet Take 0.5 tablets (12.5 mg total) by mouth daily. 30 tablet 3   No current facility-administered medications on file prior to visit.     Allergies  Allergen Reactions  . Ampicillin Anaphylaxis  . Atorvastatin Calcium Other (See Comments)    Pt reported lip swelling  . Penicillins Anaphylaxis  . Sulfonamide Derivatives Anaphylaxis  . Hydrocodone-Acetaminophen Hives    Have to take a Benadryl to take med  . Tramadol Itching  . Tylenol [Acetaminophen] Itching     ROS: Review of Systems Negative except as above  PHYSICAL EXAM: BP 140/85   Pulse 84   Temp 97.8 F (36.6 C) (Oral)   Resp 16   Wt 153 lb 9.6 oz (69.7 kg)   SpO2 98%   BMI 26.37 kg/m   Physical Exam  General appearance - alert, well appearing, and in no distress Mental status -  normal mood, behavior, speech, dress, motor activity, and thought processes Musculoskeletal -no edema or erythema noted in the left thigh or knee.  Circumference of the left thigh appears equal to the right.  She has mild tenderness along the tendon and the medial aspect of the left popliteal area.  She has good range of motion of the left knee.  ASSESSMENT AND PLAN: 1. Pain of left lower extremity I do think this is musculoskeletal in nature and seem to localize to the tendon behind the knee in the medial popliteal area.  She is agreeable to a Toradol shot. - ketorolac (TORADOL) 30 MG/ML injection 30 mg  2. Gastroesophageal reflux disease, esophagitis presence not  specified Refill Nexium but have stepdown from twice daily to once a day dosing - esomeprazole (NEXIUM) 20 MG capsule; Take 1 capsule (20 mg total) by mouth daily.  Dispense: 30 capsule; Refill: 2  3. Mixed hyperlipidemia - simvastatin (ZOCOR) 10 MG tablet; Take 1 tablet (10 mg total) by mouth at bedtime.  Dispense: 30 tablet; Refill: 6   Patient was given the opportunity to ask questions.  Patient verbalized understanding of the plan and was able to repeat key elements of the plan.   No orders of the defined types were placed in this encounter.    Requested Prescriptions   Signed Prescriptions Disp Refills  . esomeprazole (NEXIUM) 20 MG capsule 30 capsule 2    Sig: Take 1 capsule (20 mg total) by mouth daily.  Marland Kitchen loratadine (CLARITIN) 10 MG tablet 30 tablet 3    Sig: Take 1 tablet (10 mg total) by mouth daily.  . simvastatin (ZOCOR) 10 MG tablet 30 tablet 6    Sig: Take 1 tablet (10 mg total) by mouth at bedtime.    Future Appointments  Date Time Provider Department Center  06/11/2019  2:10 PM Marcine Matar, MD [facility] None    Jonah Blue, MD, [facility]

## 2019-04-07 ENCOUNTER — Ambulatory Visit: Payer: Medicare Other | Admitting: Internal Medicine

## 2019-04-20 ENCOUNTER — Other Ambulatory Visit: Payer: Self-pay | Admitting: Family Medicine

## 2019-04-20 NOTE — Telephone Encounter (Signed)
1) Medication(s) Requested (by name): benzonacacte for cough  2) Pharmacy of Choice: [facility]   3) Special Requests:   Approved medications will be sent to the pharmacy, we will reach out if there is an issue.  Requests made after 3pm may not be addressed until the following business day!  If a patient is unsure of the name of the medication(s) please note and ask patient to call back when they are able to provide all info, do not send to responsible party until all information is available!

## 2019-04-22 MED FILL — LINZESS 290 MCG CAPSULE: 290 | 30 days supply | Qty: 30 | Fill #3

## 2019-04-28 MED FILL — FLUTICASONE PROP 50 MCG SPR: 50 | 30 days supply | Qty: 16 | Fill #2

## 2019-06-11 ENCOUNTER — Other Ambulatory Visit: Payer: Self-pay

## 2019-06-11 ENCOUNTER — Encounter: Payer: Self-pay | Admitting: Internal Medicine

## 2019-06-11 ENCOUNTER — Ambulatory Visit: Payer: Medicare Other | Attending: Internal Medicine | Admitting: Internal Medicine

## 2019-06-11 DIAGNOSIS — R1013 Epigastric pain: Secondary | ICD-10-CM

## 2019-06-11 DIAGNOSIS — E782 Mixed hyperlipidemia: Secondary | ICD-10-CM | POA: Diagnosis not present

## 2019-06-11 DIAGNOSIS — K219 Gastro-esophageal reflux disease without esophagitis: Secondary | ICD-10-CM

## 2019-06-11 DIAGNOSIS — I10 Essential (primary) hypertension: Secondary | ICD-10-CM | POA: Diagnosis not present

## 2019-06-11 MED ORDER — OMEPRAZOLE 20 MG PO CPDR: 20.0000 mg | DELAYED_RELEASE_CAPSULE | Freq: Two times a day (BID) | ORAL | 3 refills | Status: DC

## 2019-06-11 MED ORDER — SPIRONOLACTONE 25 MG PO TABS: 12.5000 mg | ORAL_TABLET | Freq: Every day | ORAL | 6 refills | Status: DC

## 2019-06-11 NOTE — Progress Notes (Signed)
Virtual Visit via Telephone Note Due to current restrictions/limitations of in-office visits due to the COVID-19 pandemic, this scheduled clinical appointment was converted to a telehealth visit  I connected with [name] on 06/11/19 at 1:56 p.m by telephone and verified that I am speAvie Arenasaking with the correct person using two identifiers. I am in my office.  The patient is at home.  Only the patient and myself participated in this encounter.  I discussed the limitations, risks, security and privacy concerns of performing an evaluation and management service by telephone and the availability of in person appointments. I also discussed with the patient that there may be a patient responsible charge related to this service. The patient expressed understanding and agreed to proceed.   History of Present Illness: Pt with hx of HTN, chronic constipation,anal fistulas,vertigo, HL, chronic back pain (followed by pain specialist)and GERD  GERD:  Feels she can not get this under control.  Gets a lot of belching and feeling bloated all the time Taking Nexium daily and avoids food that she knows would cause flareup of acid reflux.  She feels the Nexium is not working.  HL:  Compliant with Simvastatin  HTN:  No device to check BP.  Saw her pain specialist yesterday; it was lower than 130/80 No CP/SOB/LE Walking about 20 mins/day  Outpatient Encounter Medications as of 06/11/2019  Medication Sig  . amLODipine (NORVASC) 10 MG tablet Take 1 tablet (10 mg total) by mouth daily.  . benzonatate (TESSALON) 100 MG capsule TAKE 1 CAPSULE BY MOUTH THREE TIMES DAILY AS NEEDED  . Blood Pressure Monitor DEVI Use as directed to check home blood pressure 2-3 times a week  . cetirizine (ZYRTEC) 10 MG tablet Take 1 tablet (10 mg total) by mouth daily.  . diclofenac sodium (VOLTAREN) 1 % GEL Apply 2 g topically 4 (four) times daily.  . DULoxetine (CYMBALTA) 30 MG capsule Take 1 capsule by mouth at bedtime.  Marland Kitchen.  esomeprazole (NEXIUM) 20 MG capsule Take 1 capsule (20 mg total) by mouth daily.  . fluticasone (FLONASE) 50 MCG/ACT nasal spray Place 2 sprays into both nostrils daily.  Marland Kitchen. LINZESS 290 MCG CAPS capsule TAKE 1 CAPSULE BY MOUTH DAILY BEFORE BREAKFAST.  Marland Kitchen. loratadine (CLARITIN) 10 MG tablet Take 1 tablet (10 mg total) by mouth daily.  . meclizine (ANTIVERT) 25 MG tablet Take 1 tablet (25 mg total) by mouth 3 (three) times daily as needed for dizziness.  Marland Kitchen. oxycodone (OXY-IR) 5 MG capsule Take 5 mg by mouth every 8 (eight) hours as needed.  . simvastatin (ZOCOR) 10 MG tablet Take 1 tablet (10 mg total) by mouth at bedtime.  Marland Kitchen. spironolactone (ALDACTONE) 25 MG tablet Take 0.5 tablets (12.5 mg total) by mouth daily.   No facility-administered encounter medications on file as of 06/11/2019.       Observations/Objective: Lab Results  Component Value Date   WBC 5.5 04/01/2019   HGB 12.8 04/01/2019   HCT 39.6 04/01/2019   MCV 91.2 04/01/2019   PLT 264 04/01/2019     Chemistry      Component Value Date/Time   NA 138 04/01/2019 1054   NA 140 03/24/2019 0858   K 4.2 04/01/2019 1054   CL 104 04/01/2019 1054   CO2 25 04/01/2019 1054   BUN 7 04/01/2019 1054   BUN 11 03/24/2019 0858   CREATININE 0.67 04/01/2019 1054      Component Value Date/Time   CALCIUM 9.2 04/01/2019 1054   ALKPHOS 79 10/23/2018  1100   AST 17 10/23/2018 1100   ALT 11 10/23/2018 1100   BILITOT 0.2 10/23/2018 1100       Assessment and Plan: 1. Dyspepsia We will do an H. pylori breath test.  We discussed trying a different PPI.  She will start Nexium and we will put her on omeprazole instead. Advised she can also try putting a little bit of baking soda and water and drinking it to help decrease the bloating - H. pylori breath test; Future  2. Mixed hyperlipidemia Continue simvastatin  3. Essential hypertension Patient reports that blood pressure was checked yesterday at the pain specialist office and it was below  130/80.  She will continue the amlodipine and spironolactone.  Advised to continue low-salt diet.  Encouraged her to continue to remain active - spironolactone (ALDACTONE) 25 MG tablet; Take 0.5 tablets (12.5 mg total) by mouth daily.  Dispense: 30 tablet; Refill: 6  4. Gastroesophageal reflux disease without esophagitis See #1 above - omeprazole (PRILOSEC) 20 MG capsule; Take 1 capsule (20 mg total) by mouth 2 (two) times daily before a meal.  Dispense: 60 capsule; Refill: 3   Follow Up Instructions: Follow-up in 3 months   I discussed the assessment and treatment plan with the patient. The patient was provided an opportunity to ask questions and all were answered. The patient agreed with the plan and demonstrated an understanding of the instructions.   The patient was advised to call back or seek an in-person evaluation if the symptoms worsen or if the condition fails to improve as anticipated.  I provided 9 minutes of non-face-to-face time during this encounter.   Karle Plumber, MD

## 2019-06-11 NOTE — Progress Notes (Signed)
Pt is requesting something for gas  Pt states she has been having gas for a week now and nothing otc seems to help

## 2019-06-16 ENCOUNTER — Ambulatory Visit: Payer: Medicare Other | Attending: Internal Medicine

## 2019-06-16 ENCOUNTER — Other Ambulatory Visit: Payer: Self-pay

## 2019-06-16 DIAGNOSIS — R1013 Epigastric pain: Secondary | ICD-10-CM

## 2019-06-17 ENCOUNTER — Other Ambulatory Visit: Payer: Self-pay | Admitting: Internal Medicine

## 2019-06-17 LAB — H. PYLORI BREATH TEST: H pylori Breath Test: NEGATIVE

## 2019-06-17 MED FILL — FLUTICASONE PROP 50 MCG SPR: 50 | 30 days supply | Qty: 16 | Fill #3

## 2019-06-18 ENCOUNTER — Other Ambulatory Visit: Payer: Self-pay | Admitting: Internal Medicine

## 2019-06-18 MED FILL — LINZESS 290 MCG CAPSULE: 290 | 30 days supply | Qty: 30 | Fill #0

## 2019-06-23 ENCOUNTER — Other Ambulatory Visit: Payer: Self-pay | Admitting: Pharmacist

## 2019-06-23 ENCOUNTER — Telehealth: Payer: Self-pay

## 2019-06-23 MED ORDER — LORATADINE 10 MG PO TABS: 10.0000 mg | ORAL_TABLET | Freq: Every day | ORAL | 3 refills | Status: DC

## 2019-06-23 NOTE — Telephone Encounter (Signed)
Contacted pt to go over breath test pt is aware and doesn't have any questions or concerns

## 2019-07-20 ENCOUNTER — Emergency Department (HOSPITAL_COMMUNITY)
Admission: EM | Admit: 2019-07-20 | Discharge: 2019-07-20 | Disposition: A | Discharge status: Home / Self Care | Payer: Medicare Other | Attending: Emergency Medicine | Admitting: Emergency Medicine

## 2019-07-20 ENCOUNTER — Other Ambulatory Visit: Payer: Self-pay

## 2019-07-20 DIAGNOSIS — Z79899 Other long term (current) drug therapy: Secondary | ICD-10-CM | POA: Insufficient documentation

## 2019-07-20 DIAGNOSIS — M79605 Pain in left leg: Secondary | ICD-10-CM | POA: Insufficient documentation

## 2019-07-20 DIAGNOSIS — M79604 Pain in right leg: Secondary | ICD-10-CM | POA: Diagnosis present

## 2019-07-20 DIAGNOSIS — I10 Essential (primary) hypertension: Secondary | ICD-10-CM | POA: Insufficient documentation

## 2019-07-20 DIAGNOSIS — M545 Low back pain, unspecified: Secondary | ICD-10-CM

## 2019-07-20 MED ORDER — CYCLOBENZAPRINE HCL 5 MG PO TABS: 5.0000 mg | ORAL_TABLET | Freq: Two times a day (BID) | ORAL | 0 refills | Status: DC | PRN

## 2019-07-20 MED FILL — CYCLOBENZAPRINE 5 MG TABLET: 5 | 15 days supply | Qty: 30 | Fill #0 | Status: TO

## 2019-07-20 NOTE — ED Provider Notes (Signed)
[facility] Provider Note   CSN: [id number] Arrival date & time: 07/20/19  0160     History   Chief Complaint Chief Complaint  Patient presents with   Leg Pain    HPI [name] is a 53 y.o. female.     HPI   53 year old female presents today with complaints of bilateral leg pain.  Patient notes that 3 days ago [name] was helping clean [name] mom's house when [name] had a pain in [name] right lower lumbar region.  [name] notes pain in [name] bilateral thighs since that time.  [name] denies any loss of distal sensation strength or motor function.  [name] denies any acute abdominal pain.  [name] does note a history of chronic back pain for which [name] is receiving monthly injections, [name] notes that [name] was not having [name] chronic back pain prior to the symptoms starting and notes this feels different than [name] normal back pain.  [name] denies any significant trauma to the area.  [name] is currently being seen by pain management.  [name] took oxycodone this morning which did not significantly improve [name] symptoms.  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 Claritin   Vertigo     Patient Active Problem List   Diagnosis Date Noted   Altered consciousness 12/10/2018   Carpal tunnel syndrome of right wrist 06/12/2018   Postnasal drip 06/12/2018   Vertigo 10/18/2016   Right arm pain 04/19/2014   Disturbance of skin sensation 04/19/2014   Anal fistula 04/14/2013   UNSPECIFIED SITE OF SPRAIN AND STRAIN 07/07/2009   PEDAL EDEMA 06/29/2009   MUSCLE PAIN 06/23/2009   ANEMIA-IRON DEFICIENCY 03/11/2009   ALLERGIC RHINITIS 03/11/2009   SYNCOPE, VASOVAGAL 03/11/2009   HEADACHE 03/11/2009    Past Surgical History:  Procedure Laterality Date   ANAL FISTULECTOMY  08/22/2012   Procedure: FISTULECTOMY ANAL;  Surgeon: Madilyn Hook, DO;  Location: [place];  Service: General;  Laterality: N/A;  rectal examination under anesthesia with seton placement    ANAL FISTULECTOMY  09/17/2012   Procedure: FISTULECTOMY ANAL;  Surgeon: Madilyn Hook, DO;  Location: [place];  Service: General;  Laterality: N/A;  possible fistulotomy   D & C HYSTEROSCOPY/ NOVASURE ENDOMETRIAL ABLATION/ I & D THROMBOSED HEMORROID  01-10-2007  [facility]   EXAMINATION UNDER ANESTHESIA  09/17/2012   Procedure: EXAM UNDER ANESTHESIA;  Surgeon: Madilyn Hook, DO;  Location: [place];  Service: General;  Laterality: N/A;  Rectal exam under anesthesia   EXAMINATION UNDER ANESTHESIA N/A 05/14/2013   Procedure: EXAM UNDER ANESTHESIA;  Surgeon: Leighton Ruff, MD;  Location: [facility];  Service: General;  Laterality: N/A;   FISTULA PLUG  09/17/2012   Procedure: FISTULA PLUG;  Surgeon: Madilyn Hook, DO;  Location: [place];  Service: General;  Laterality: N/A;  Possible fistula plug   HEMORRHOID SURGERY  04-28-2009   [facility] INTERNAL HEMORRHOIDS   IRRIGATION AND DEBRIDEMENT ABSCESS  07/08/2012   Procedure: MINOR INCISION AND DRAINAGE OF ABSCESS;  Surgeon: Thurnell Lose, MD;  Location: [place];  Service: Gynecology;  Laterality: N/A;  Vulvar Abscess   PLACEMENT OF SETON N/A 05/14/2013   Procedure:  PLACEMENT OF SETON;  Surgeon: Leighton Ruff, MD;  Location: [facility];  Service: General;  Laterality: N/A;   RECTAL EXAM UNDER ANESTHESIA N/A 08/20/2013   Procedure: mucosal advancement flap ;  Surgeon: Romie LeveeAlicia Thomas, MD;  Location: [facility];  Service: General;  Laterality: N/A;  parks anal retractor long rectal instrucments prone jack knife anal fistula    RECTAL ULTRASOUND N/A 07/20/2013   Procedure: RECTAL ULTRASOUND;  Surgeon: Romie LeveeAlicia Thomas, MD;  Location: [facility];  Service: Endoscopy;  Laterality: N/A;      OB History   No obstetric history on file.      Home Medications    Prior to Admission medications   Medication Sig Start Date End Date Taking? Authorizing Provider  amLODipine (NORVASC) 10 MG tablet Take 1 tablet (10 mg total) by mouth daily. 03/10/19   Marcine MatarJohnson, Deborah B, MD  benzonatate (TESSALON) 100 MG capsule TAKE 1 CAPSULE BY MOUTH THREE TIMES DAILY AS NEEDED 06/18/19   Marcine MatarJohnson, Deborah B, MD  Blood Pressure Monitor DEVI Use as directed to check home blood pressure 2-3 times a week 03/10/19   Marcine MatarJohnson, Deborah B, MD  cetirizine (ZYRTEC) 10 MG tablet Take 1 tablet (10 mg total) by mouth daily. 12/23/18   Claiborne RiggFleming, Zelda W, NP  cyclobenzaprine (FLEXERIL) 5 MG tablet Take 1 tablet (5 mg total) by mouth 2 (two) times daily as needed for muscle spasms. 07/20/19   Saraphina Lauderbaugh, Tinnie GensJeffrey, PA-C  diclofenac sodium (VOLTAREN) 1 % GEL Apply 2 g topically 4 (four) times daily. 04/01/19   Aviva KluverMurray, Alyssa B, PA-C  DULoxetine (CYMBALTA) 30 MG capsule Take 1 capsule by mouth at bedtime.    [provider]  fluticasone (FLONASE) 50 MCG/ACT nasal spray Place 2 sprays into both nostrils daily. 12/23/18   Claiborne RiggFleming, Zelda W, NP  linaclotide [name](LINZESS) 290 MCG CAPS capsule Take 1 capsule (290 mcg total) by mouth daily before breakfast. Patient needs office visit for further refills 06/17/19   Hilarie FredricksonPerry, John N, MD  loratadine (CLARITIN) 10 MG tablet Take 1 tablet (10 mg total) by mouth daily. 06/23/19   Marcine MatarJohnson, Deborah B, MD  meclizine (ANTIVERT) 25 MG tablet Take 1 tablet (25 mg total) by mouth 3 (three) times daily as needed for dizziness. 12/10/18   Alvira MondaySchlossman, Erin, MD  omeprazole (PRILOSEC) 20 MG capsule Take 1 capsule (20 mg total) by mouth 2 (two) times daily before a meal. 06/11/19   Marcine MatarJohnson, Deborah B, MD  oxycodone (OXY-IR) 5 MG capsule Take 5 mg by mouth every 8 (eight) hours as needed.    [provider]  simvastatin (ZOCOR) 10 MG tablet Take 1 tablet (10 mg total) by mouth at bedtime. 04/03/19    Marcine MatarJohnson, Deborah B, MD  spironolactone (ALDACTONE) 25 MG tablet Take 0.5 tablets (12.5 mg total) by mouth daily. 06/11/19   Marcine MatarJohnson, Deborah B, MD    Family History Family History  Problem Relation Age of Onset   Diabetes Mother    Heart attack Father    Diabetes Brother    Diabetes Maternal Uncle    Diabetes Maternal Aunt    Seizures Sister    Colon cancer Neg Hx    Stomach cancer Neg Hx     Social History Social History   Tobacco Use   Smoking status: Never Smoker   Smokeless tobacco: Never Used  Substance Use Topics   Alcohol use: Yes    Alcohol/week: 0.0 standard drinks    Comment: occa   Drug use: No     Allergies   Ampicillin, Atorvastatin calcium, Penicillins,  Sulfonamide derivatives, Hydrocodone-acetaminophen, Tramadol, and Tylenol [acetaminophen]   Review of Systems Review of Systems  All other systems reviewed and are negative.    Physical Exam Updated Vital Signs BP 129/80 (BP Location: Right Arm)    Pulse (!) 102    Resp 17    SpO2 98%   Physical Exam Vitals signs and nursing note reviewed.  Constitutional:      Appearance: [name] is well-developed.  HENT:     Head: Normocephalic and atraumatic.  Eyes:     General: No scleral icterus.       Right eye: No discharge.        Left eye: No discharge.     Conjunctiva/sclera: Conjunctivae normal.     Pupils: Pupils are equal, round, and reactive to light.  Neck:     Musculoskeletal: Normal range of motion.     Vascular: No JVD.     Trachea: No tracheal deviation.  Pulmonary:     Effort: Pulmonary effort is normal.     Breath sounds: No stridor.  Musculoskeletal:     Comments: No midline tenderness to the palpation of CT or L-spine, minor tenderness palpation of the right lateral lumbar musculature, bilateral lower extremity sensation strength and motor function is intact, straight leg negative bilateral  Neurological:     Mental Status: [name] is alert and oriented to person, place, and  time.     Coordination: Coordination normal.  Psychiatric:        Behavior: Behavior normal.        Thought Content: Thought content normal.        Judgment: Judgment normal.      ED Treatments / Results  Labs (all labs ordered are listed, but only abnormal results are displayed) Labs Reviewed - No data to display  EKG None  Radiology No results found.  Procedures Procedures (including critical care time)  Medications Ordered in ED Medications - No data to display   Initial Impression / Assessment and Plan / ED Course  I have reviewed the triage vital signs and the nursing notes.  Pertinent labs & imaging results that were available during my care of the patient were reviewed by me and considered in my medical decision making (see chart for details).        53 year old female with acute on chronic low back pain.  [name] has no red flags here today.  [name] is in no acute distress.  Very unlikely to be intra-abdominal or any other concerning etiology at this time.  Patient will be given muscle relaxers encouraged to follow-up with pain management, return immediately if [name] develops any new or worsening signs or symptoms.  Verbalized understanding and agreement to this plan had no further questions concerns the time of discharge.  Final Clinical Impressions(s) / ED Diagnoses   Final diagnoses:  Right-sided low back pain without sciatica, unspecified chronicity    ED Discharge Orders         Ordered    cyclobenzaprine (FLEXERIL) 5 MG tablet  2 times daily PRN     07/20/19 1050           Eyvonne Mechanic, PA-C 07/20/19 1050    Cathren Laine, MD 07/20/19 1350

## 2019-07-20 NOTE — ED Triage Notes (Signed)
Patient reports bilateral thigh pain since Friday, states she had an injection for back pain and thinks she pulled something after doing some lifting. Took an oxycodone at 8am with some relief. Ambulatory with steady gait.

## 2019-07-20 NOTE — Discharge Instructions (Addendum)
Please read attached information. If you experience any new or worsening signs or symptoms please return to the emergency room for evaluation. Please follow-up with your primary care provider or specialist as discussed. Please use medication prescribed only as directed and discontinue taking if you have any concerning signs or symptoms.   °

## 2019-07-22 MED FILL — LINZESS 290 MCG CAPSULE: 290 | 30 days supply | Qty: 30 | Fill #1

## 2019-07-23 MED FILL — FLUTICASONE PROP 50 MCG SPR: 50 | 30 days supply | Qty: 16 | Fill #4

## 2019-07-26 ENCOUNTER — Other Ambulatory Visit: Payer: Self-pay | Admitting: Internal Medicine

## 2019-07-26 DIAGNOSIS — I10 Essential (primary) hypertension: Secondary | ICD-10-CM

## 2019-08-23 ENCOUNTER — Other Ambulatory Visit: Payer: Self-pay | Admitting: Internal Medicine

## 2019-08-23 DIAGNOSIS — I10 Essential (primary) hypertension: Secondary | ICD-10-CM

## 2019-09-14 ENCOUNTER — Encounter: Payer: Self-pay | Admitting: Internal Medicine

## 2019-09-14 ENCOUNTER — Other Ambulatory Visit: Payer: Self-pay

## 2019-09-14 ENCOUNTER — Ambulatory Visit: Payer: Medicare Other | Attending: Internal Medicine | Admitting: Internal Medicine

## 2019-09-14 DIAGNOSIS — I10 Essential (primary) hypertension: Secondary | ICD-10-CM

## 2019-09-14 DIAGNOSIS — K219 Gastro-esophageal reflux disease without esophagitis: Secondary | ICD-10-CM | POA: Diagnosis not present

## 2019-09-14 DIAGNOSIS — J309 Allergic rhinitis, unspecified: Secondary | ICD-10-CM

## 2019-09-14 DIAGNOSIS — E782 Mixed hyperlipidemia: Secondary | ICD-10-CM

## 2019-09-14 MED ORDER — OMEPRAZOLE 20 MG PO CPDR: 20.0000 mg | DELAYED_RELEASE_CAPSULE | Freq: Two times a day (BID) | ORAL | 3 refills | Status: DC

## 2019-09-14 MED ORDER — BENZONATATE 100 MG PO CAPS: ORAL_CAPSULE | ORAL | 0 refills | Status: DC

## 2019-09-14 MED ORDER — LORATADINE 10 MG PO TABS: 10.0000 mg | ORAL_TABLET | Freq: Every day | ORAL | 3 refills | Status: DC

## 2019-09-14 MED ORDER — AMLODIPINE BESYLATE 10 MG PO TABS: ORAL_TABLET | ORAL | 1 refills | Status: DC

## 2019-09-14 NOTE — Progress Notes (Signed)
Virtual Visit via Telephone Note Due to current restrictions/limitations of in-office visits due to the COVID-19 pandemic, this scheduled clinical appointment was converted to a telehealth visit  I connected with [name] on 09/14/19 at 11:12 a.m by telephone and verified that I am speaking with the correct person using two identifiers. I am in my office.  The patient is at home.  Only the patient and myself participated in this encounter.  I discussed the limitations, risks, security and privacy concerns of performing an evaluation and management service by telephone and the availability of in person appointments. I also discussed with the patient that there may be a patient responsible charge related to this service. The patient expressed understanding and agreed to proceed.   History of Present Illness: Pt with hx of HTN, chronic constipation,anal fistulas,vertigo, HL, chronic back pain (followed by pain specialist)and GERD  HTN: no device to check BP.  Reports compliance with spironolactone and Norvasc.  Saw her GYN Dr. Renaldo Fiddler on 09/10/2019 and BP was good -limits salt in foods No chest pain, shortness of breath or lower extremity swelling.  GERD: doing better on Omeprazole BID which replaced Nexium on last visit Tries to avoid foods that she knows would cause flareup of acid reflux  HL:  Taking and tolerating Zocor.  Eating more bake than fried foods.  Drinks mainly water and KoolAid.  She walks for about 15 mins daily  Still sees Dr. Lowell Bouton for chronic pain management.    Requests refill on Claritin and Tessalon Perles for allergies and postnasal drip.  She uses the [facility] as needed   Outpatient Encounter Medications as of 09/14/2019  Medication Sig  . amLODipine (NORVASC) 10 MG tablet TAKE 1 TABLET(10 MG) BY MOUTH DAILY  . benzonatate (TESSALON) 100 MG capsule TAKE 1 CAPSULE BY MOUTH THREE TIMES DAILY AS NEEDED  . Blood Pressure Monitor DEVI Use as directed to  check home blood pressure 2-3 times a week  . cetirizine (ZYRTEC) 10 MG tablet Take 1 tablet (10 mg total) by mouth daily.  . cyclobenzaprine (FLEXERIL) 5 MG tablet Take 1 tablet (5 mg total) by mouth 2 (two) times daily as needed for muscle spasms.  . diclofenac sodium (VOLTAREN) 1 % GEL Apply 2 g topically 4 (four) times daily.  . DULoxetine (CYMBALTA) 30 MG capsule Take 1 capsule by mouth at bedtime.  . fluticasone (FLONASE) 50 MCG/ACT nasal spray Place 2 sprays into both nostrils daily.  Marland Kitchen linaclotide (LINZESS) 290 MCG CAPS capsule Take 1 capsule (290 mcg total) by mouth daily before breakfast. Patient needs office visit for further refills  . loratadine (CLARITIN) 10 MG tablet Take 1 tablet (10 mg total) by mouth daily.  . meclizine (ANTIVERT) 25 MG tablet Take 1 tablet (25 mg total) by mouth 3 (three) times daily as needed for dizziness.  Marland Kitchen omeprazole (PRILOSEC) 20 MG capsule Take 1 capsule (20 mg total) by mouth 2 (two) times daily before a meal.  . oxycodone (OXY-IR) 5 MG capsule Take 5 mg by mouth every 8 (eight) hours as needed.  . simvastatin (ZOCOR) 10 MG tablet Take 1 tablet (10 mg total) by mouth at bedtime.  Marland Kitchen spironolactone (ALDACTONE) 25 MG tablet Take 0.5 tablets (12.5 mg total) by mouth daily.   No facility-administered encounter medications on file as of 09/14/2019.       Observations/Objective: Lab Results  Component Value Date   WBC 5.5 04/01/2019   HGB 12.8 04/01/2019   HCT 39.6 04/01/2019  MCV 91.2 04/01/2019   PLT 264 04/01/2019     Chemistry      Component Value Date/Time   NA 138 04/01/2019 1054   NA 140 03/24/2019 0858   K 4.2 04/01/2019 1054   CL 104 04/01/2019 1054   CO2 25 04/01/2019 1054   BUN 7 04/01/2019 1054   BUN 11 03/24/2019 0858   CREATININE 0.67 04/01/2019 1054      Component Value Date/Time   CALCIUM 9.2 04/01/2019 1054   ALKPHOS 79 10/23/2018 1100   AST 17 10/23/2018 1100   ALT 11 10/23/2018 1100   BILITOT 0.2 10/23/2018 1100        Assessment and Plan: 1. Essential hypertension Blood pressure under control.  Continue amlodipine and spironolactone.  Low-salt diet encouraged. - amLODipine (NORVASC) 10 MG tablet; TAKE 1 TABLET(10 MG) BY MOUTH DAILY  Dispense: 90 tablet; Refill: 1  2. Gastroesophageal reflux disease without esophagitis Better control on twice daily omeprazole. - omeprazole (PRILOSEC) 20 MG capsule; Take 1 capsule (20 mg total) by mouth 2 (two) times daily before a meal.  Dispense: 60 capsule; Refill: 3  3. Mixed hyperlipidemia Continue Zocor.  4. Allergic rhinitis, unspecified seasonality, unspecified trigger - benzonatate (TESSALON) 100 MG capsule; TAKE 1 CAPSULE BY MOUTH twice DAILY AS NEEDED  Dispense: 30 capsule; Refill: 0 - loratadine (CLARITIN) 10 MG tablet; Take 1 tablet (10 mg total) by mouth daily.  Dispense: 30 tablet; Refill: 3   Follow Up Instructions: 4 mths   I discussed the assessment and treatment plan with the patient. The patient was provided an opportunity to ask questions and all were answered. The patient agreed with the plan and demonstrated an understanding of the instructions.   The patient was advised to call back or seek an in-person evaluation if the symptoms worsen or if the condition fails to improve as anticipated.  I provided 8 minutes of non-face-to-face time during this encounter.   Karle Plumber, MD

## 2019-09-15 ENCOUNTER — Ambulatory Visit: Payer: Medicare Other | Admitting: Internal Medicine

## 2019-09-30 MED FILL — LINZESS 290 MCG CAPSULE: 290 | 30 days supply | Qty: 30 | Fill #2

## 2019-09-30 MED FILL — FLUTICASONE PROP 50 MCG SPR: 50 | 30 days supply | Qty: 16 | Fill #5

## 2019-10-09 ENCOUNTER — Other Ambulatory Visit: Payer: Self-pay | Admitting: Internal Medicine

## 2019-10-09 DIAGNOSIS — E782 Mixed hyperlipidemia: Secondary | ICD-10-CM

## 2019-11-10 ENCOUNTER — Other Ambulatory Visit: Payer: Self-pay | Admitting: Internal Medicine

## 2019-11-10 DIAGNOSIS — E782 Mixed hyperlipidemia: Secondary | ICD-10-CM

## 2019-11-20 ENCOUNTER — Other Ambulatory Visit: Payer: Self-pay | Admitting: Internal Medicine

## 2019-11-26 ENCOUNTER — Other Ambulatory Visit: Payer: Self-pay | Admitting: Internal Medicine

## 2019-11-26 DIAGNOSIS — I10 Essential (primary) hypertension: Secondary | ICD-10-CM

## 2019-12-07 DIAGNOSIS — H2513 Age-related nuclear cataract, bilateral: Secondary | ICD-10-CM | POA: Diagnosis not present

## 2019-12-07 DIAGNOSIS — H40033 Anatomical narrow angle, bilateral: Secondary | ICD-10-CM | POA: Diagnosis not present

## 2020-01-28 ENCOUNTER — Encounter: Payer: Self-pay | Admitting: Internal Medicine

## 2020-01-28 ENCOUNTER — Other Ambulatory Visit: Payer: Self-pay

## 2020-01-28 ENCOUNTER — Ambulatory Visit: Payer: Medicare Other | Attending: Internal Medicine | Admitting: Internal Medicine

## 2020-01-28 VITALS — BP 115/75 | HR 82 | Temp 97.0°F | Resp 16 | Wt 160.0 lb

## 2020-01-28 DIAGNOSIS — I10 Essential (primary) hypertension: Secondary | ICD-10-CM | POA: Diagnosis not present

## 2020-01-28 DIAGNOSIS — E782 Mixed hyperlipidemia: Secondary | ICD-10-CM

## 2020-01-28 DIAGNOSIS — K219 Gastro-esophageal reflux disease without esophagitis: Secondary | ICD-10-CM

## 2020-01-28 DIAGNOSIS — R0982 Postnasal drip: Secondary | ICD-10-CM | POA: Diagnosis not present

## 2020-01-28 MED ORDER — LORATADINE 10 MG PO TABS: 10.0000 mg | ORAL_TABLET | Freq: Every day | ORAL | 6 refills | Status: DC

## 2020-01-28 MED ORDER — BENZONATATE 100 MG PO CAPS: ORAL_CAPSULE | ORAL | 0 refills | Status: DC

## 2020-01-28 MED ORDER — FLUTICASONE PROPIONATE 50 MCG/ACT NA SUSP: 1.0000 | Freq: Every day | NASAL | 6 refills | Status: DC

## 2020-01-28 MED ORDER — SIMVASTATIN 10 MG PO TABS: ORAL_TABLET | ORAL | 1 refills | Status: DC

## 2020-01-28 MED ORDER — AMLODIPINE BESYLATE 10 MG PO TABS: ORAL_TABLET | ORAL | 1 refills | Status: DC

## 2020-01-28 MED ORDER — SPIRONOLACTONE 25 MG PO TABS: ORAL_TABLET | ORAL | 1 refills | Status: DC

## 2020-01-28 NOTE — Progress Notes (Signed)
Patient ID: [name], female    DOB: Oct 04, 1966  MRN: [id number]  CC: Hypertension   Subjective: [name] is a 54 y.o. female who presents for chronic ds management Her concerns today include:  Pt with hx of HTN, chronic constipation,anal fistulas,vertigo, HL, chronic back pain (followed by pain specialist)and GERD  Patient complains of having "terrible drainage down my throat" x 1 mth.  Causes cough and gagGING No nasal congestion + scratch throat Out of Flonase x 1 mth.  She is taking the Claritin.  She requests refill on Tessalon Perles  HYPERTENSION Currently taking: see medication list Med Adherence: [x]  Yes    []  No Medication side effects: []  Yes    [x]  No Adherence with salt restriction: [x]  Yes    []  No Home Monitoring?: []  Yes    [x]  No Monitoring Frequency: []  Yes    []  No Home BP results range: []  Yes    []  No SOB? []  Yes    [x]  No Chest Pain?: []  Yes    [x]  No Leg swelling?: []  Yes    [x]  No Headaches?: []  Yes    [x]  No Dizziness? []  Yes    [x]  No Comments:   HL:  Compliant with Zocor  GERD: Reports compliance with omeprazole.  She does not have any symptoms of acid reflux but reports increase gas and burping with certain foods like lettuce..    Patient Active Problem List   Diagnosis Date Noted  . Altered consciousness 12/10/2018  . Carpal tunnel syndrome of right wrist 06/12/2018  . Postnasal drip 06/12/2018  . Vertigo 10/18/2016  . Right arm pain 04/19/2014  . Disturbance of skin sensation 04/19/2014  . Anal fistula 04/14/2013  . UNSPECIFIED SITE OF SPRAIN AND STRAIN 07/07/2009  . PEDAL EDEMA 06/29/2009  . MUSCLE PAIN 06/23/2009  . 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 Allergic rhinitis 03/11/2009  . SYNCOPE, VASOVAGAL 03/11/2009  . HEADACHE 03/11/2009     Current Outpatient Medications on File Prior to Visit  Medication Sig Dispense Refill  . benzonatate (TESSALON) 100 MG capsule TAKE 1 CAPSULE BY MOUTH twice DAILY AS NEEDED (Patient  not taking: Reported on 01/28/2020) 30 capsule 0  . Blood Pressure Monitor DEVI Use as directed to check home blood pressure 2-3 times a week 1 Device 0  . cyclobenzaprine (FLEXERIL) 5 MG tablet Take 1 tablet (5 mg total) by mouth 2 (two) times daily as needed for muscle spasms. 30 tablet 0  . diclofenac sodium (VOLTAREN) 1 % GEL Apply 2 g topically 4 (four) times daily. 100 g 0  . DULoxetine (CYMBALTA) 30 MG capsule Take 1 capsule by mouth at bedtime.    linaclotide (LINZESS) 290 MCG CAPS capsule Take 1 capsule (290 mcg total) by mouth daily before breakfast. Patient needs office visit for further refills 30 capsule 2  . meclizine (ANTIVERT) 25 MG tablet Take 1 tablet (25 mg total) by mouth 3 (three) times daily as needed for dizziness. 30 tablet 0  . omeprazole (PRILOSEC) 20 MG capsule Take 1 capsule (20 mg total) by mouth 2 (two) times daily before a meal. 60 capsule 3  . oxycodone (OXY-IR) 5 MG capsule Take 5 mg by mouth every 8 (eight) hours as needed.     No current facility-administered medications on file prior to visit.    Allergies  Allergen Reactions  . Ampicillin Anaphylaxis  . Atorvastatin Calcium Other (See Comments)    Pt reported lip swelling  . Penicillins Anaphylaxis  .  Sulfonamide Derivatives Anaphylaxis  . Hydrocodone-Acetaminophen Hives    Have to take a Benadryl to take med  . Tramadol Itching  . Tylenol [Acetaminophen] Itching    Social History   Socioeconomic History  . Marital status: Single    Spouse name: Not on file  . Number of children: 2  . Years of education: 10  . Highest education level: Not on file  Occupational History  . Occupation: N/A  Tobacco Use  . Smoking status: Never Smoker  . Smokeless tobacco: Never Used  Substance and Sexual Activity  . Alcohol use: Yes    Alcohol/week: 0.0 standard drinks    Comment: occa  . Drug use: No  . Sexual activity: Yes    Birth control/protection: Pill  Other Topics Concern  . Not on file    Social History Narrative   Lives at home w/ her mother   Right-handed   Caffeine: 1 cup of tea per day   Social Determinants of Health   Financial Resource Strain:   . Difficulty of Paying Living Expenses:   Food Insecurity:   . Worried About Programme researcher, broadcasting/film/video in the Last Year:   . Barista in the Last Year:   Transportation Needs:   . Freight forwarder (Medical):   Marland Kitchen Lack of Transportation (Non-Medical):   Physical Activity:   . Days of Exercise per Week:   . Minutes of Exercise per Session:   Stress:   . Feeling of Stress :   Social Connections:   . Frequency of Communication with Friends and Family:   . Frequency of Social Gatherings with Friends and Family:   . Attends Religious Services:   . Active Member of Clubs or Organizations:   . Attends Banker Meetings:   Marland Kitchen Marital Status:   Intimate Partner Violence:   . Fear of Current or Ex-Partner:   . Emotionally Abused:   Marland Kitchen Physically Abused:   . Sexually Abused:     Family History  Problem Relation Age of Onset  . Diabetes Mother   . Heart attack Father   . Diabetes Brother   . Diabetes Maternal Uncle   . Diabetes Maternal Aunt   . Seizures Sister   . Colon cancer Neg Hx   . Stomach cancer Neg Hx     Past Surgical History:  Procedure Laterality Date  . ANAL FISTULECTOMY  08/22/2012   Procedure: FISTULECTOMY ANAL;  Surgeon: Lodema Pilot, DO;  Location: [facility];  Service: General;  Laterality: N/A;  rectal examination under anesthesia with seton placement   . ANAL FISTULECTOMY  09/17/2012   Procedure: FISTULECTOMY ANAL;  Surgeon: Lodema Pilot, DO;  Location: [facility];  Service: General;  Laterality: N/A;  possible fistulotomy  . D & C HYSTEROSCOPY/ NOVASURE ENDOMETRIAL ABLATION/ I & D THROMBOSED HEMORROID  01-10-2007  DR Miguel Aschoff  . EXAMINATION UNDER ANESTHESIA  09/17/2012   Procedure: EXAM UNDER ANESTHESIA;  Surgeon: Lodema Pilot, DO;  Location: [facility];  Service: General;  Laterality: N/A;  Rectal exam under anesthesia  . EXAMINATION UNDER ANESTHESIA N/A 05/14/2013   Procedure: EXAM UNDER ANESTHESIA;  Surgeon: Romie Levee, MD;  Location: [facility];  Service: General;  Laterality: N/A;  . FISTULA PLUG  09/17/2012   Procedure: FISTULA PLUG;  Surgeon: Lodema Pilot, DO;  Location: [facility];  Service: General;  Laterality: N/A;  Possible fistula plug  . HEMORRHOID SURGERY  04-28-2009  [place] [facility] HEMORRHOIDS  . IRRIGATION AND DEBRIDEMENT ABSCESS  07/08/2012   Procedure: MINOR INCISION AND DRAINAGE OF ABSCESS;  Surgeon: Thurnell Lose, MD;  Location: [place] [facility];  Service: Gynecology;  Laterality: N/A;  Vulvar Abscess  . PLACEMENT OF SETON N/A 05/14/2013   Procedure:  PLACEMENT OF SETON;  Surgeon: Leighton Ruff, MD;  Location: [facility];  Service: General;  Laterality: N/A;  . RECTAL EXAM UNDER ANESTHESIA N/A 08/20/2013   Procedure: mucosal advancement flap ;  Surgeon: Leighton Ruff, MD;  Location: [facility];  Service: General;  Laterality: N/A;  parks anal retractor long rectal instrucments prone jack knife anal fistula   . RECTAL ULTRASOUND N/A 07/20/2013   Procedure: RECTAL ULTRASOUND;  Surgeon: Leighton Ruff, MD;  Location: [facility];  Service: Endoscopy;  Laterality: N/A;    ROS: Review of Systems Negative except as stated above  PHYSICAL EXAM: BP 115/75   Pulse 82   Temp (!) 97 F (36.1 C)   Resp 16   Wt 160 lb (72.6 kg)   SpO2 96%   BMI 27.46 kg/m   Physical Exam  General appearance - alert, well appearing, and in no distress Mental status - normal mood, behavior, speech, dress, motor activity, and thought processes Nose - normal and patent, no erythema, discharge or polyps Mouth - mucous membranes moist, pharynx normal without lesions Chest - clear to auscultation, no wheezes, rales or rhonchi, symmetric air entry Heart - normal rate, regular rhythm,  normal S1, S2, no murmurs, rubs, clicks or gallops Extremities - peripheral pulses normal, no pedal edema, no clubbing or cyanosis  CMP Latest Ref Rng & Units 04/01/2019 03/24/2019 03/12/2019  Glucose 70 - 99 mg/dL 93 83 -  BUN 6 - 20 mg/dL 7 11 -  Creatinine 0.44 - 1.00 mg/dL 0.67 0.72 -  Sodium 135 - 145 mmol/L 138 140 -  Potassium 3.5 - 5.1 mmol/L 4.2 4.6 2.8(L)  Chloride 98 - 111 mmol/L 104 100 -  CO2 22 - 32 mmol/L 25 26 -  Calcium 8.9 - 10.3 mg/dL 9.2 9.5 -  Total Protein 6.0 - 8.5 g/dL - - -  Total Bilirubin 0.0 - 1.2 mg/dL - - -  Alkaline Phos 39 - 117 IU/L - - -  AST 0 - 40 IU/L - - -  ALT 0 - 32 IU/L - - -   Lipid Panel     Component Value Date/Time   CHOL 331 (H) 10/23/2018 1100   TRIG 126 10/23/2018 1100   HDL 69 10/23/2018 1100   CHOLHDL 4.8 (H) 10/23/2018 1100   CHOLHDL 3.8 03/19/2009 0414   VLDL 13 03/19/2009 0414   LDLCALC 237 (H) 10/23/2018 1100    CBC    Component Value Date/Time   WBC 5.5 04/01/2019 1054   RBC 4.34 04/01/2019 1054   HGB 12.8 04/01/2019 1054   HGB 13.2 03/16/2019 1427   HGB 13.8 09/05/2009 1347   HCT 39.6 04/01/2019 1054   HCT 39.2 03/16/2019 1427   HCT 41.3 09/05/2009 1347   PLT 264 04/01/2019 1054   PLT 323 03/12/2018 1152   MCV 91.2 04/01/2019 1054   MCV 87 03/16/2019 1427   MCV 92.0 09/05/2009 1347   MCH 29.5 04/01/2019 1054   MCHC 32.3 04/01/2019 1054   RDW 13.3 04/01/2019 1054   RDW 12.9 03/16/2019 1427   RDW 13.0 09/05/2009 1347   LYMPHSABS 2.2 04/01/2019 1054   LYMPHSABS 2.9 03/16/2019 1427   LYMPHSABS 2.1 09/05/2009 1347  MONOABS 0.4 04/01/2019 1054   MONOABS 0.3 09/05/2009 1347   EOSABS 0.1 04/01/2019 1054   EOSABS 0.1 03/16/2019 1427   BASOSABS 0.1 04/01/2019 1054   BASOSABS 0.1 03/16/2019 1427   BASOSABS 0.1 09/05/2009 1347    ASSESSMENT AND PLAN: 1. Essential hypertension -At goal.  Continue spironolactone/amlodipine - spironolactone (ALDACTONE) 25 MG tablet; TAKE 1/2 TABLET(12.5 MG) BY MOUTH DAILY   Dispense: 90 tablet; Refill: 1 - amLODipine (NORVASC) 10 MG tablet; TAKE 1 TABLET(10 MG) BY MOUTH DAILY  Dispense: 90 tablet; Refill: 1 - CBC - Comprehensive metabolic panel  2. Post-nasal drip Refill Flonase.  Continue Claritin.  I will also send a refill on the Tessalon Perles - fluticasone (FLONASE) 50 MCG/ACT nasal spray; Place 1 spray into both nostrils daily.  Dispense: 16 g; Refill: 6 - loratadine (CLARITIN) 10 MG tablet; Take 1 tablet (10 mg total) by mouth daily.  Dispense: 30 tablet; Refill: 6  3. Mixed hyperlipidemia - simvastatin (ZOCOR) 10 MG tablet; TAKE 1 TABLET(10 MG) BY MOUTH AT BEDTIME  Dispense: 90 tablet; Refill: 1 - Lipid panel  4. Gastroesophageal reflux disease without esophagitis Continue omeprazole.  Advised patient to avoid foods that cause increased gas for her.    Patient was given the opportunity to ask questions.  Patient verbalized understanding of the plan and was able to repeat key elements of the plan.   Orders Placed This Encounter  Procedures  . CBC  . Comprehensive metabolic panel  . Lipid panel     Requested Prescriptions   Signed Prescriptions Disp Refills  . fluticasone (FLONASE) 50 MCG/ACT nasal spray 16 g 6    Sig: Place 1 spray into both nostrils daily.  Marland Kitchen loratadine (CLARITIN) 10 MG tablet 30 tablet 6    Sig: Take 1 tablet (10 mg total) by mouth daily.  Marland Kitchen spironolactone (ALDACTONE) 25 MG tablet 90 tablet 1    Sig: TAKE 1/2 TABLET(12.5 MG) BY MOUTH DAILY  . amLODipine (NORVASC) 10 MG tablet 90 tablet 1    Sig: TAKE 1 TABLET(10 MG) BY MOUTH DAILY  . simvastatin (ZOCOR) 10 MG tablet 90 tablet 1    Sig: TAKE 1 TABLET(10 MG) BY MOUTH AT BEDTIME    Return in about 4 months (around 05/29/2020).  Jonah Blue, MD, FACP

## 2020-01-29 LAB — LIPID PANEL
Chol/HDL Ratio: 3.3 ratio (ref 0.0–4.4)
Cholesterol, Total: 199 mg/dL (ref 100–199)
HDL: 60 mg/dL (ref >39)
LDL Chol Calc (NIH): 118 mg/dL — ABNORMAL HIGH (ref 0–99)
Triglycerides: 120 mg/dL (ref 0–149)
VLDL Cholesterol Cal: 21 mg/dL (ref 5–40)

## 2020-01-29 LAB — COMPREHENSIVE METABOLIC PANEL
ALT: 8 IU/L (ref 0–32)
AST: 17 IU/L (ref 0–40)
Albumin/Globulin Ratio: 1.6 (ref 1.2–2.2)
Albumin: 4.4 g/dL (ref 3.8–4.9)
Alkaline Phosphatase: 83 IU/L (ref 39–117)
BUN/Creatinine Ratio: 8 — ABNORMAL LOW (ref 9–23)
BUN: 7 mg/dL (ref 6–24)
Bilirubin Total: <0.2 mg/dL (ref 0.0–1.2)
CO2: 27 mmol/L (ref 20–29)
Calcium: 9.7 mg/dL (ref 8.7–10.2)
Chloride: 103 mmol/L (ref 96–106)
Creatinine, Ser: 0.84 mg/dL (ref 0.57–1.00)
GFR calc Af Amer: 90 mL/min/{1.73_m2} (ref >59)
GFR calc non Af Amer: 78 mL/min/{1.73_m2} (ref >59)
Globulin, Total: 2.8 g/dL (ref 1.5–4.5)
Glucose: 91 mg/dL (ref 65–99)
Potassium: 4.1 mmol/L (ref 3.5–5.2)
Sodium: 144 mmol/L (ref 134–144)
Total Protein: 7.2 g/dL (ref 6.0–8.5)

## 2020-01-29 LAB — CBC
Hematocrit: 38.2 % (ref 34.0–46.6)
Hemoglobin: 13 g/dL (ref 11.1–15.9)
MCH: 30.4 pg (ref 26.6–33.0)
MCHC: 34 g/dL (ref 31.5–35.7)
MCV: 90 fL (ref 79–97)
Platelets: 288 10*3/uL (ref 150–450)
RBC: 4.27 x10E6/uL (ref 3.77–5.28)
RDW: 12.8 % (ref 11.7–15.4)
WBC: 4.9 10*3/uL (ref 3.4–10.8)

## 2020-02-01 DIAGNOSIS — G894 Chronic pain syndrome: Secondary | ICD-10-CM | POA: Diagnosis not present

## 2020-02-01 DIAGNOSIS — G90511 Complex regional pain syndrome I of right upper limb: Secondary | ICD-10-CM | POA: Diagnosis not present

## 2020-02-01 DIAGNOSIS — K5903 Drug induced constipation: Secondary | ICD-10-CM | POA: Diagnosis not present

## 2020-02-01 DIAGNOSIS — M47812 Spondylosis without myelopathy or radiculopathy, cervical region: Secondary | ICD-10-CM | POA: Diagnosis not present

## 2020-02-09 ENCOUNTER — Telehealth: Payer: Self-pay

## 2020-02-09 NOTE — Telephone Encounter (Signed)
Pt. Called back for lab results.  Pt. Verified DOB and was informed on results.  Pt. Stated she has been taking her Simvastatin every night before she go to bed.

## 2020-02-10 NOTE — Telephone Encounter (Signed)
Contacted pt to go Dr. Laural Benes response pt didn't answer left a detailed vm informing pt and if she has any questions or concerns to give a call back

## 2020-02-17 DIAGNOSIS — M5416 Radiculopathy, lumbar region: Secondary | ICD-10-CM | POA: Diagnosis not present

## 2020-02-29 DIAGNOSIS — G894 Chronic pain syndrome: Secondary | ICD-10-CM | POA: Diagnosis not present

## 2020-02-29 DIAGNOSIS — G90511 Complex regional pain syndrome I of right upper limb: Secondary | ICD-10-CM | POA: Diagnosis not present

## 2020-02-29 DIAGNOSIS — K5903 Drug induced constipation: Secondary | ICD-10-CM | POA: Diagnosis not present

## 2020-02-29 DIAGNOSIS — M47812 Spondylosis without myelopathy or radiculopathy, cervical region: Secondary | ICD-10-CM | POA: Diagnosis not present

## 2020-03-14 ENCOUNTER — Other Ambulatory Visit: Payer: Self-pay | Admitting: Internal Medicine

## 2020-03-14 DIAGNOSIS — K219 Gastro-esophageal reflux disease without esophagitis: Secondary | ICD-10-CM

## 2020-03-14 MED ORDER — OMEPRAZOLE 20 MG PO CPDR: 20.0000 mg | DELAYED_RELEASE_CAPSULE | Freq: Two times a day (BID) | ORAL | 3 refills | Status: DC

## 2020-03-14 NOTE — Telephone Encounter (Signed)
Pharmacy of choice is [facility] on [address] in [place]

## 2020-03-14 NOTE — Telephone Encounter (Signed)
Patient came in stating she has been having bad reflux and needs medicine. Discomfort started last Friday. Reflux is really bad at night causing her to burp uncontrollably to the point she wants to vomit

## 2020-03-23 ENCOUNTER — Telehealth: Payer: Self-pay

## 2020-03-23 NOTE — Telephone Encounter (Signed)
1) Medication(s) Requested (by name): LINZESS  2) Pharmacy of Choice: [facility]  3) Special Requests:   Approved medications will be sent to the pharmacy, we will reach out if there is an issue.  Requests made after 3pm may not be addressed until the following business day!  If a patient is unsure of the name of the medication(s) please note and ask patient to call back when they are able to provide all info, do not send to responsible party until all information is available!

## 2020-03-24 MED ORDER — LINACLOTIDE 290 MCG PO CAPS: 290.0000 ug | ORAL_CAPSULE | Freq: Every day | ORAL | 2 refills | Status: DC

## 2020-03-24 NOTE — Telephone Encounter (Signed)
Rx sent 

## 2020-03-28 DIAGNOSIS — G90511 Complex regional pain syndrome I of right upper limb: Secondary | ICD-10-CM | POA: Diagnosis not present

## 2020-03-28 DIAGNOSIS — G894 Chronic pain syndrome: Secondary | ICD-10-CM | POA: Diagnosis not present

## 2020-03-28 DIAGNOSIS — K5903 Drug induced constipation: Secondary | ICD-10-CM | POA: Diagnosis not present

## 2020-03-28 DIAGNOSIS — M47812 Spondylosis without myelopathy or radiculopathy, cervical region: Secondary | ICD-10-CM | POA: Diagnosis not present

## 2020-04-10 IMAGING — MR MR LUMBAR SPINE W/O CM
5 series · 48 of 48 positions shown · non-contrast
Comparison: Plain films lumbar spine 12/13/2016.

CLINICAL DATA: Low back pain radiating into both legs for 5 years.

EXAM:
MRI LUMBAR SPINE WITHOUT CONTRAST
TECHNIQUE: Multiplanar, multisequence MR imaging of the lumbar spine was
performed. No intravenous contrast was administered.

[Series 3: T2 post-contrast · sagittal · 4.0mm · 0.88mm/px · 6 of 12 slices shown]
[im 1/12]
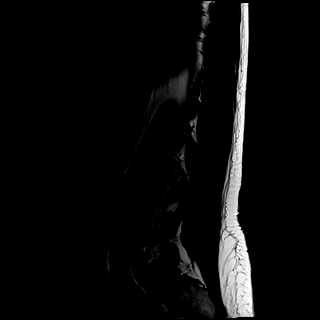
[im 3/12]
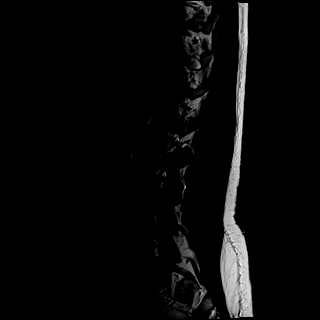
[im 5/12]
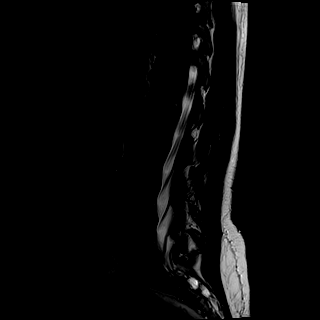
[im 7/12]
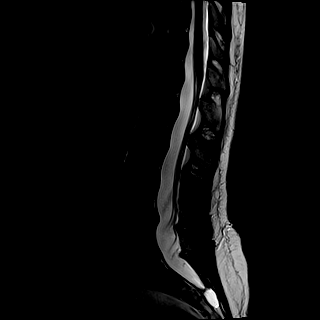
[im 9/12]
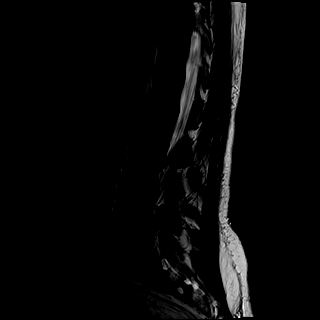
[im 12/12]
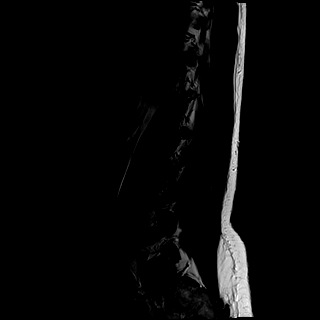

[Series 4: T1 · sagittal · 4.0mm · 0.88mm/px · 5 of 12 slices shown (1 of 2)]
[im 1/12]
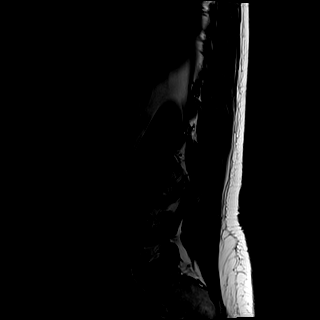
[im 3/12]
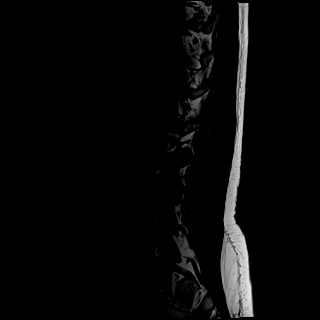
[im 6/12]
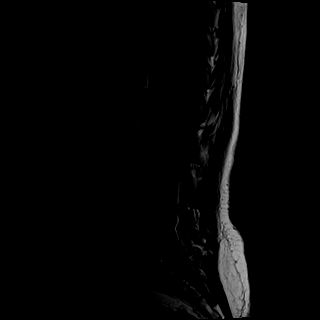
[im 9/12]
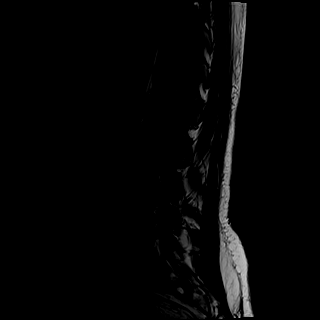
[im 12/12]
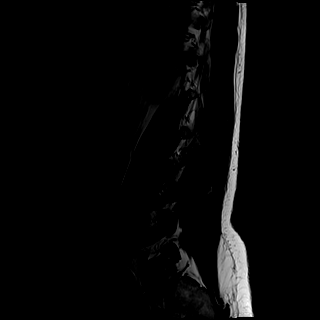

[Series 5: tirm sag · sagittal · 4.0mm · 0.55mm/px · 5 of 12 slices shown]
[im 1/12]
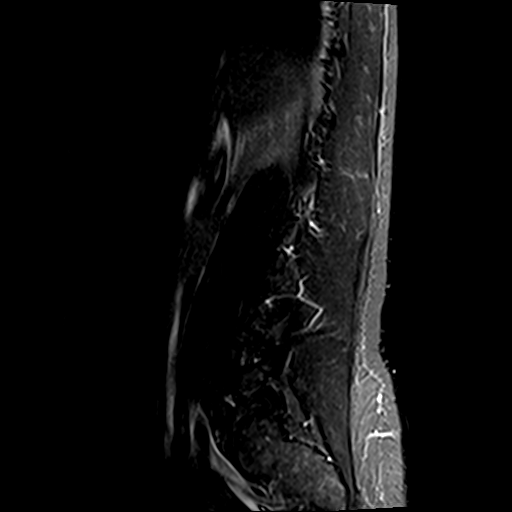
[im 3/12]
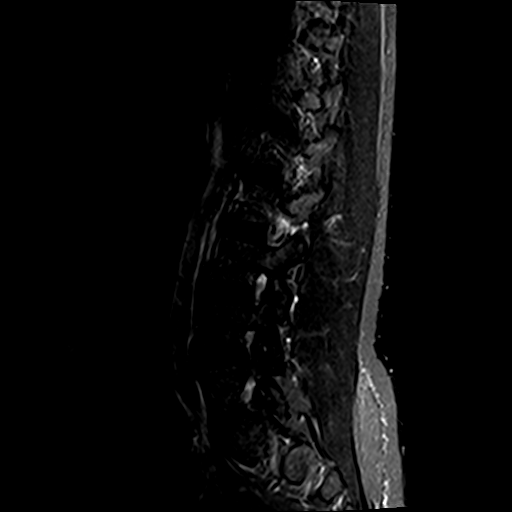
[im 6/12]
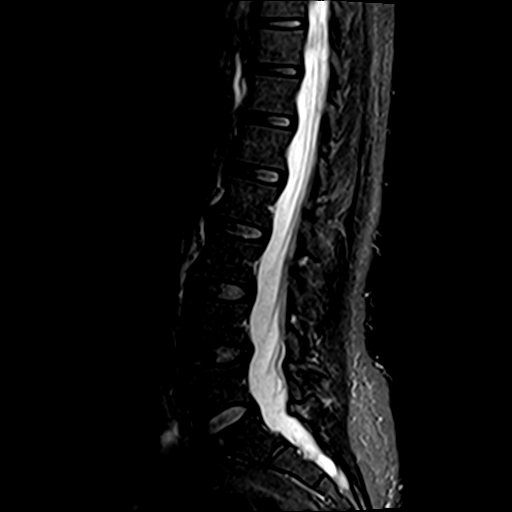
[im 9/12]
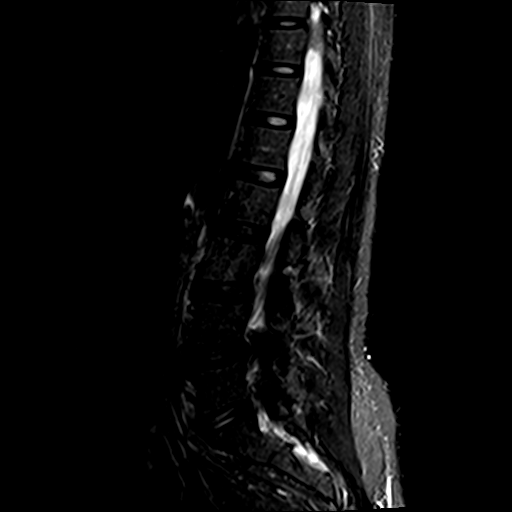
[im 12/12]
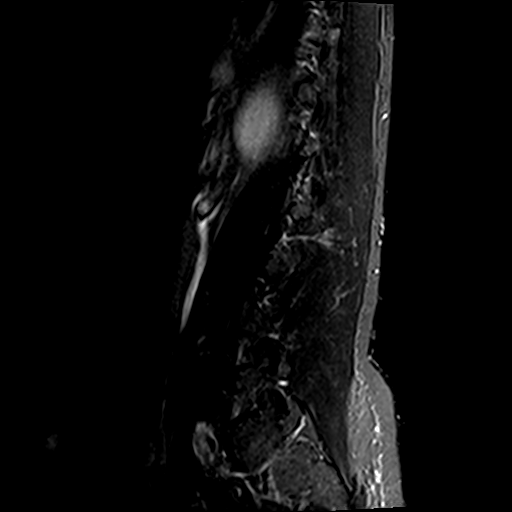

[Series 6: T1 · axial · 4.0mm · 0.78mm/px · z∈[-96,+105]mm · 16 of 36 slices shown (2 of 2)]
[im 1/36]
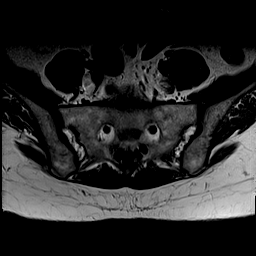
[im 3/36]
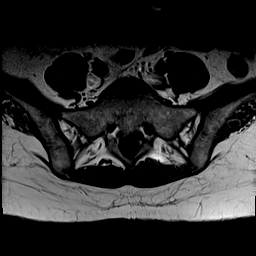
[im 5/36]
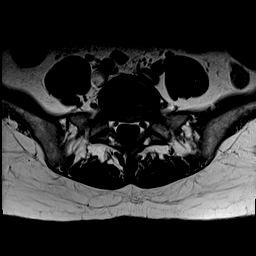
[im 8/36]
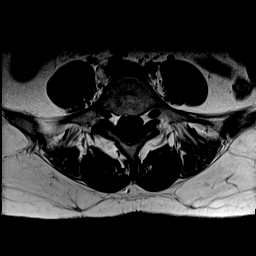
[im 10/36]
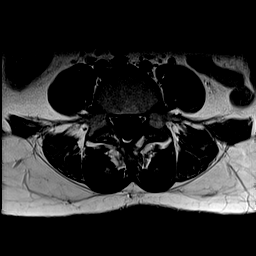
[im 12/36]
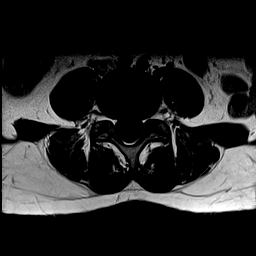
[im 15/36]
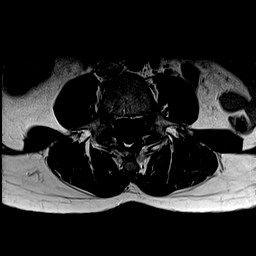
[im 17/36]
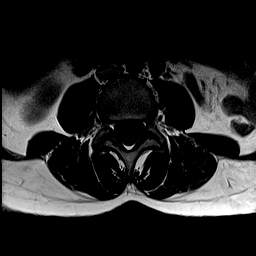
[im 19/36]
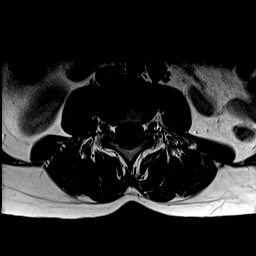
[im 22/36]
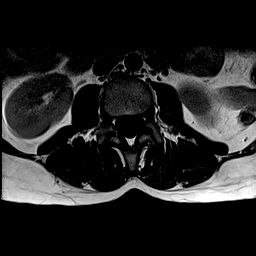
[im 24/36]
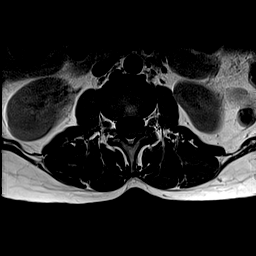
[im 26/36]
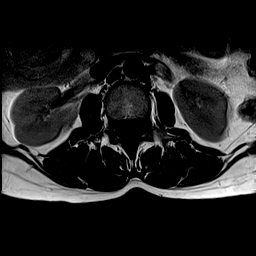
[im 29/36]
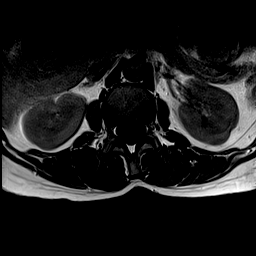
[im 31/36]
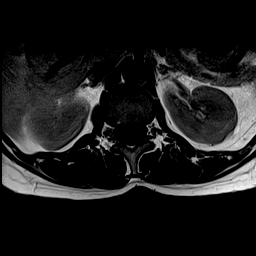
[im 33/36]
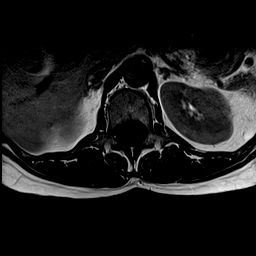
[im 36/36]
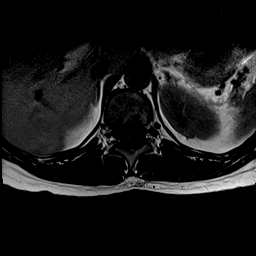

[Series 7: T2 · axial · 4.0mm · 0.78mm/px · z∈[-96,+105]mm · 16 of 36 slices shown]
[im 1/36]
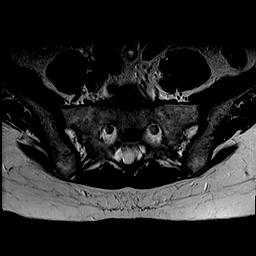
[im 3/36]
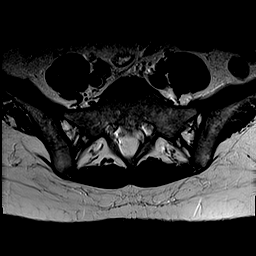
[im 5/36]
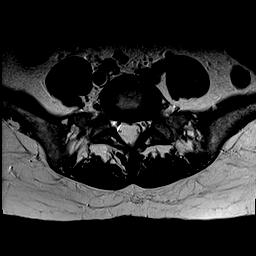
[im 8/36]
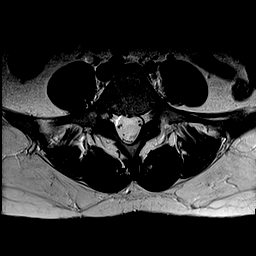
[im 10/36]
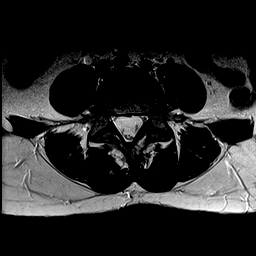
[im 12/36]
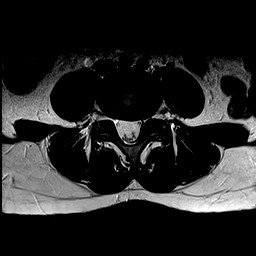
[im 15/36]
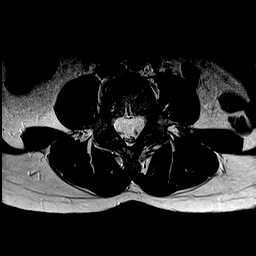
[im 17/36]
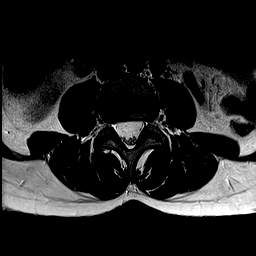
[im 19/36]
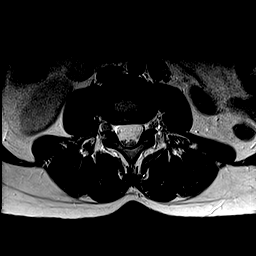
[im 22/36]
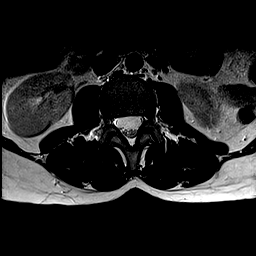
[im 24/36]
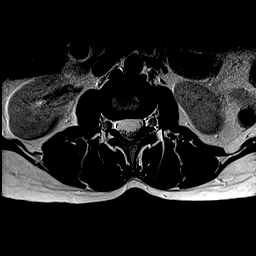
[im 26/36]
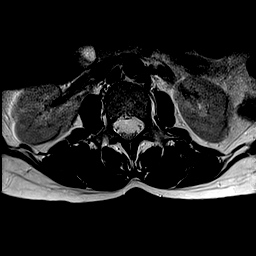
[im 29/36]
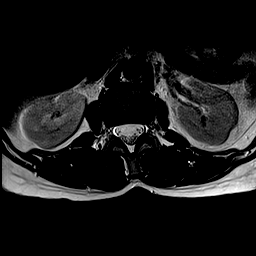
[im 31/36]
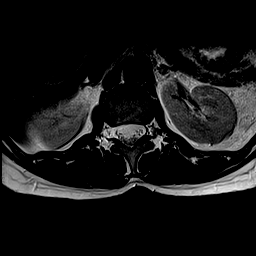
[im 33/36]
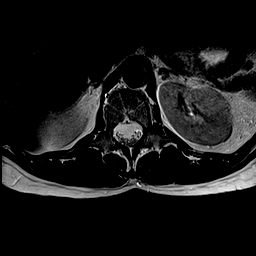
[im 36/36]
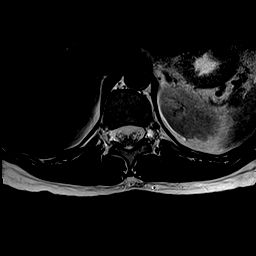

[48 of 48 positions shown; findings below may reference images not displayed]

FINDINGS: Segmentation:  Standard.

Alignment:  Normal.

Vertebrae:  Height and signal are normal.

Conus medullaris and cauda equina: Conus extends to the T12-L1
level. Conus and cauda equina appear normal.

Paraspinal and other soft tissues: 1.8 cm in diameter stone is seen
in the gallbladder.

Disc levels:

T10-11 and T11-12 are imaged in the sagittal plane only and
negative.

T12-L1: Negative.

L1-2: Negative.

L2-3: Negative.

L3-4: Mild facet degenerative change.  Otherwise negative.

L4-5: There is a shallow broad-based left paracentral protrusion and
mild facet degenerative change. There is mild left foraminal
narrowing. The central canal and right foramen are widely patent.

L5-S1: Negative.
IMPRESSION: Shallow broad-based left paracentral protrusion at L4-5 without
central canal stenosis. Mild left foraminal narrowing is seen at
this level. The lumbar spine is otherwise negative.

Cholelithiasis.

## 2020-04-18 ENCOUNTER — Other Ambulatory Visit: Payer: Self-pay

## 2020-04-18 ENCOUNTER — Emergency Department (HOSPITAL_COMMUNITY)
Admission: EM | Admit: 2020-04-18 | Discharge: 2020-04-18 | Disposition: A | Discharge status: Home / Self Care | Payer: Medicare Other | Attending: Emergency Medicine | Admitting: Emergency Medicine

## 2020-04-18 ENCOUNTER — Encounter (HOSPITAL_COMMUNITY): Payer: Self-pay

## 2020-04-18 DIAGNOSIS — I10 Essential (primary) hypertension: Secondary | ICD-10-CM | POA: Diagnosis not present

## 2020-04-18 DIAGNOSIS — Y929 Unspecified place or not applicable: Secondary | ICD-10-CM | POA: Insufficient documentation

## 2020-04-18 DIAGNOSIS — W57XXXA Bitten or stung by nonvenomous insect and other nonvenomous arthropods, initial encounter: Secondary | ICD-10-CM | POA: Insufficient documentation

## 2020-04-18 DIAGNOSIS — T7840XA Allergy, unspecified, initial encounter: Secondary | ICD-10-CM | POA: Diagnosis not present

## 2020-04-18 DIAGNOSIS — Y999 Unspecified external cause status: Secondary | ICD-10-CM | POA: Insufficient documentation

## 2020-04-18 DIAGNOSIS — Y939 Activity, unspecified: Secondary | ICD-10-CM | POA: Insufficient documentation

## 2020-04-18 DIAGNOSIS — R21 Rash and other nonspecific skin eruption: Secondary | ICD-10-CM | POA: Diagnosis present

## 2020-04-18 DIAGNOSIS — Z79899 Other long term (current) drug therapy: Secondary | ICD-10-CM | POA: Insufficient documentation

## 2020-04-18 DIAGNOSIS — S30861A Insect bite (nonvenomous) of abdominal wall, initial encounter: Secondary | ICD-10-CM | POA: Diagnosis not present

## 2020-04-18 MED ORDER — FAMOTIDINE IN NACL 20-0.9 MG/50ML-% IV SOLN
20.0000 mg | Freq: Once | INTRAVENOUS | Status: AC
  Administered 2020-04-18: 20 mg via INTRAVENOUS
  Filled 2020-04-18: qty 50

## 2020-04-18 MED ORDER — FAMOTIDINE 20 MG PO TABS: 20.0000 mg | ORAL_TABLET | Freq: Two times a day (BID) | ORAL | 0 refills | Status: DC

## 2020-04-18 MED ORDER — EPINEPHRINE 0.3 MG/0.3ML IJ SOAJ: 0.3000 mg | INTRAMUSCULAR | 0 refills | Status: AC | PRN

## 2020-04-18 MED ORDER — DIPHENHYDRAMINE HCL 25 MG PO TABS: 25.0000 mg | ORAL_TABLET | Freq: Four times a day (QID) | ORAL | 0 refills | Status: DC | PRN

## 2020-04-18 MED ORDER — MORPHINE SULFATE (PF) 2 MG/ML IV SOLN
2.0000 mg | Freq: Once | INTRAVENOUS | Status: AC
  Administered 2020-04-18: 2 mg via INTRAVENOUS
  Filled 2020-04-18: qty 1

## 2020-04-18 MED ORDER — DIPHENHYDRAMINE HCL 50 MG/ML IJ SOLN
25.0000 mg | Freq: Once | INTRAMUSCULAR | Status: AC
  Administered 2020-04-18: 25 mg via INTRAVENOUS
  Filled 2020-04-18: qty 1

## 2020-04-18 NOTE — ED Triage Notes (Signed)
Pt arrives to ED w/ c/o rash to abdomen and back. Pt endorses 5/10 pain and itchiness. Pt denies exposure to allergen.

## 2020-04-18 NOTE — Discharge Instructions (Addendum)
At this time there does not appear to be the presence of an emergent medical condition, however there is always the potential for conditions to change. Please read and follow the below instructions.  Please return to the Emergency Department immediately for any new or worsening symptoms. Please be sure to follow up with your Primary Care Provider within one week regarding your visit today; please call their office to schedule an appointment even if you are feeling better for a follow-up visit. You may use the EpiPen if you have signs of worsening allergic reaction or of anaphylaxis.  If you use the EpiPen you must call 911 and return to the emergency department immediately as there is a high risk of recurrence of your symptoms when the epinephrine wears off. Please continue taking the Benadryl and Pepcid as prescribed to help with itching.  A cool compress may also help with your itching.  Please see your primary care doctor this week for a recheck.  Get help right away if: You have joint pain. You have a rash. You feel more tired or sleepy than you normally do. You have neck pain. You have a headache. You feel weaker than you normally do. You have signs of an anaphylactic reaction. Signs may include: Feeling warm in the face. Itchy, red, swollen areas of skin. Swelling of your: Eyes. Lips. Face. Mouth. Tongue. Throat. Trouble with any of these: Breathing. Talking. Swallowing. Loud breathing. Feeling dizzy or light-headed. Passing out. Pain or cramps in your belly. Throwing up. Watery poop. You have any new/concerning or worsening of symptoms  Please read the additional information packets attached to your discharge summary.  Do not take your medicine if  develop an itchy rash, swelling in your mouth or lips, or difficulty breathing; call 911 and seek immediate emergency medical attention if this occurs.  Note: Portions of this text may have been transcribed using voice  recognition software. Every effort was made to ensure accuracy; however, inadvertent computerized transcription errors may still be present.

## 2020-04-18 NOTE — ED Provider Notes (Signed)
[facility] EMERGENCY DEPARTMENT Provider Note   CSN: [id number] Arrival date & time: 04/18/20  1233     History Chief Complaint  Patient presents with  . Rash    [name] is a 54 y.o. female history of hypertension, GERD, hyperlipidemia, allergies, hemorrhoids/anal fistula.  Patient presents today for concern of allergic reaction.  She reports that 3 days ago she was outside for quite a while and believes that she was bitten by a bug on her abdomen.  She reports she had a small red raised area that was burning mild pain constant no aggravating or alleviating factors.  She does not take any medication prior to arrival for her symptoms.  She reports over the next few hours she had a small red rash around the bite, over the next 2 days the rash spread and now she has itching and erythema of her right upper arm as well as the center of her back.  She denies similar in the past.  Patient denies fever/chills, headache, sore throat, swelling of the face/head/neck, cough/shortness of breath, nausea/vomiting, diarrhea, chest pain or any additional concerns.  Tdap up-to-date as of October 13, 2018. HPI     Past Medical History:  Diagnosis Date  . 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 Anemia   . Constipation   . GERD (gastroesophageal reflux disease)   . Hemorrhoids   . HTN (hypertension)   . Hyperlipidemia   . Seasonal allergies 08-13-13   tx. Claritin  . Vertigo     Patient Active Problem List   Diagnosis Date Noted  . Altered consciousness 12/10/2018  . Carpal tunnel syndrome of right wrist 06/12/2018  . Postnasal drip 06/12/2018  . Vertigo 10/18/2016  . Right arm pain 04/19/2014  . Disturbance of skin sensation 04/19/2014  . Anal fistula 04/14/2013  . UNSPECIFIED SITE OF SPRAIN AND STRAIN 07/07/2009  . PEDAL EDEMA 06/29/2009  . MUSCLE PAIN 06/23/2009  . 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 Allergic rhinitis 03/11/2009  . SYNCOPE, VASOVAGAL 03/11/2009  . HEADACHE  03/11/2009    Past Surgical History:  Procedure Laterality Date  . ANAL FISTULECTOMY  08/22/2012   Procedure: FISTULECTOMY ANAL;  Surgeon: Lodema Pilot, DO;  Location: [facility];  Service: General;  Laterality: N/A;  rectal examination under anesthesia with seton placement   . ANAL FISTULECTOMY  09/17/2012   Procedure: FISTULECTOMY ANAL;  Surgeon: Lodema Pilot, DO;  Location: [facility];  Service: General;  Laterality: N/A;  possible fistulotomy  . D & C HYSTEROSCOPY/ NOVASURE ENDOMETRIAL ABLATION/ I & D THROMBOSED HEMORROID  01-10-2007  DR Miguel Aschoff  . EXAMINATION UNDER ANESTHESIA  09/17/2012   Procedure: EXAM UNDER ANESTHESIA;  Surgeon: Lodema Pilot, DO;  Location: [facility];  Service: General;  Laterality: N/A;  Rectal exam under anesthesia  . EXAMINATION UNDER ANESTHESIA N/A 05/14/2013   Procedure: EXAM UNDER ANESTHESIA;  Surgeon: Romie Levee, MD;  Location: [facility];  Service: General;  Laterality: N/A;  . FISTULA PLUG  09/17/2012   Procedure: FISTULA PLUG;  Surgeon: Lodema Pilot, DO;  Location: [facility];  Service: General;  Laterality: N/A;  Possible fistula plug  . HEMORRHOID SURGERY  04-28-2009   [facility] INTERNAL HEMORRHOIDS  . IRRIGATION AND DEBRIDEMENT ABSCESS  07/08/2012   Procedure: MINOR INCISION AND DRAINAGE OF ABSCESS;  Surgeon: Geryl Rankins, MD;  Location: [facility];  Service: Gynecology;  Laterality: N/A;  Vulvar Abscess  . PLACEMENT OF SETON N/A 05/14/2013  Procedure:  PLACEMENT OF SETON;  Surgeon: Romie Levee, MD;  Location: [facility];  Service: General;  Laterality: N/A;  . RECTAL EXAM UNDER ANESTHESIA N/A 08/20/2013   Procedure: mucosal advancement flap ;  Surgeon: Romie Levee, MD;  Location: [facility];  Service: General;  Laterality: N/A;  parks anal retractor long rectal instrucments prone jack knife anal fistula   . RECTAL ULTRASOUND N/A 07/20/2013   Procedure: RECTAL  ULTRASOUND;  Surgeon: Romie Levee, MD;  Location: [facility];  Service: Endoscopy;  Laterality: N/A;     OB History   No obstetric history on file.     Family History  Problem Relation Age of Onset  . Diabetes Mother   . Heart attack Father   . Diabetes Brother   . Diabetes Maternal Uncle   . Diabetes Maternal Aunt   . Seizures Sister   . Colon cancer Neg Hx   . Stomach cancer Neg Hx     Social History   Tobacco Use  . Smoking status: Never Smoker  . Smokeless tobacco: Never Used  Substance Use Topics  . Alcohol use: Yes    Alcohol/week: 0.0 standard drinks    Comment: occa  . Drug use: No    Home Medications Prior to Admission medications   Medication Sig Start Date End Date Taking? Authorizing Provider  amLODipine (NORVASC) 10 MG tablet TAKE 1 TABLET(10 MG) BY MOUTH DAILY 01/28/20   Marcine Matar, MD  benzonatate (TESSALON) 100 MG capsule TAKE 1 CAPSULE BY MOUTH twice DAILY AS NEEDED 01/28/20   Marcine Matar, MD  Blood Pressure Monitor DEVI Use as directed to check home blood pressure 2-3 times a week 03/10/19   Marcine Matar, MD  cyclobenzaprine (FLEXERIL) 5 MG tablet Take 1 tablet (5 mg total) by mouth 2 (two) times daily as needed for muscle spasms. 07/20/19   Hedges, Tinnie Gens, PA-C  diclofenac sodium (VOLTAREN) 1 % GEL Apply 2 g topically 4 (four) times daily. 04/01/19   Aviva Kluver B, PA-C  diphenhydrAMINE (BENADRYL) 25 MG tablet Take 1 tablet (25 mg total) by mouth every 6 (six) hours as needed. 04/18/20   Harlene Salts A, PA-C  DULoxetine (CYMBALTA) 30 MG capsule Take 1 capsule by mouth at bedtime.    [provider]  EPINEPHrine (EPIPEN 2-PAK) 0.3 mg/0.3 mL IJ SOAJ injection Inject 0.3 mLs (0.3 mg total) into the muscle as needed for anaphylaxis. 04/18/20   Harlene Salts A, PA-C  famotidine (PEPCID) 20 MG tablet Take 1 tablet (20 mg total) by mouth 2 (two) times daily for 5 days. 04/18/20 04/23/20  Harlene Salts A, PA-C  fluticasone  (FLONASE) 50 MCG/ACT nasal spray Place 1 spray into both nostrils daily. 01/28/20   Marcine Matar, MD  linaclotide [facility]) 290 MCG CAPS capsule Take 1 capsule (290 mcg total) by mouth daily before breakfast. 03/24/20   Marcine Matar, MD  loratadine (CLARITIN) 10 MG tablet Take 1 tablet (10 mg total) by mouth daily. 01/28/20   Marcine Matar, MD  meclizine (ANTIVERT) 25 MG tablet Take 1 tablet (25 mg total) by mouth 3 (three) times daily as needed for dizziness. 12/10/18   Alvira Monday, MD  omeprazole (PRILOSEC) 20 MG capsule Take 1 capsule (20 mg total) by mouth 2 (two) times daily before a meal. 03/14/20   Marcine Matar, MD  oxycodone (OXY-IR) 5 MG capsule Take 5 mg by mouth every 8 (eight) hours as needed.    [provider]  simvastatin (ZOCOR) 10 MG tablet TAKE 1 TABLET(10 MG) BY MOUTH AT BEDTIME 01/28/20   Marcine Matar, MD  spironolactone (ALDACTONE) 25 MG tablet TAKE 1/2 TABLET(12.5 MG) BY MOUTH DAILY 01/28/20   Marcine Matar, MD    Allergies    Ampicillin, Atorvastatin calcium, Penicillins, Sulfonamide derivatives, Hydrocodone-acetaminophen, Tramadol, and Tylenol [acetaminophen]  Review of Systems   Review of Systems  Constitutional: Negative.  Negative for chills and fever.  HENT: Negative.  Negative for facial swelling, sore throat and trouble swallowing.   Respiratory: Negative.  Negative for cough and shortness of breath.   Cardiovascular: Negative.  Negative for chest pain.  Gastrointestinal: Negative.  Negative for abdominal pain, diarrhea, nausea and vomiting.  Musculoskeletal: Negative.  Negative for neck pain.  Skin: Positive for rash.  Neurological: Negative for weakness, numbness and headaches.    Physical Exam Updated Vital Signs BP 128/80   Pulse 81   Temp 98.3 F (36.8 C) (Oral)   Resp 17   Ht 5\' 4"  (1.626 m)   Wt 69.9 kg   SpO2 100%   BMI 26.43 kg/m   Physical Exam Constitutional:      General: She is not in acute  distress.    Appearance: Normal appearance. She is well-developed. She is not ill-appearing or diaphoretic.  HENT:     Head: Normocephalic and atraumatic.     Jaw: There is normal jaw occlusion. No trismus.     Right Ear: External ear normal.     Left Ear: External ear normal.     Nose: Nose normal.     Mouth/Throat:     Mouth: Mucous membranes are moist.     Pharynx: Oropharynx is clear.  Eyes:     General: Vision grossly intact. Gaze aligned appropriately.     Extraocular Movements: Extraocular movements intact.     Conjunctiva/sclera: Conjunctivae normal.     Pupils: Pupils are equal, round, and reactive to light.  Neck:     Trachea: Trachea and phonation normal. No tracheal tenderness or tracheal deviation.  Pulmonary:     Effort: Pulmonary effort is normal. No accessory muscle usage or respiratory distress.     Breath sounds: Normal air entry.  Abdominal:     General: There is no distension.     Palpations: Abdomen is soft.     Tenderness: There is no abdominal tenderness. There is no guarding or rebound.       Comments: Small erythematous papule with small scab present.  No streaking induration fluctuance.  No bull's-eye. - Additional small erythema to the left upper extremity and in the center of the back.  Musculoskeletal:        General: Normal range of motion.     Cervical back: Normal range of motion and neck supple.  Skin:    General: Skin is warm and dry.  Neurological:     Mental Status: She is alert.     GCS: GCS eye subscore is 4. GCS verbal subscore is 5. GCS motor subscore is 6.     Comments: Speech is clear and goal oriented, follows commands Major Cranial nerves without deficit, no facial droop Moves extremities without ataxia, coordination intact  Psychiatric:        Behavior: Behavior normal.     ED Results / Procedures / Treatments   Labs (all labs ordered are listed, but only abnormal results are displayed) Labs Reviewed - No data to  display  EKG None  Radiology No results found.  Procedures Procedures (including critical care time)  Medications Ordered in ED Medications  diphenhydrAMINE (BENADRYL) injection 25 mg (25 mg Intravenous Given 04/18/20 1454)  famotidine (PEPCID) IVPB 20 mg premix (20 mg Intravenous New Bag/Given 04/18/20 1456)  morphine 2 MG/ML injection 2 mg (2 mg Intravenous Given 04/18/20 1455)    ED Course  I have reviewed the triage vital signs and the nursing notes.  Pertinent labs & imaging results that were available during my care of the patient were reviewed by me and considered in my medical decision making (see chart for details).    MDM Rules/Calculators/A&P                         54 year old female presents today with a bug bite to her abdomen that occurred 3 days ago and a mild allergic reaction with erythema of the left upper arm, center of her back and to her abdomen.  There is no evidence of stinger present, patient is not sure which she was bit by. Patient denies any difficulty breathing or swallowing.  Pt has a patent airway without stridor and is handling secretions without difficulty; no angioedema. No blisters, no pustules, no warmth, no draining sinus tracts, no superficial abscesses, no bullous impetigo, no vesicles, no desquamation, no target lesions with dusky purpura or a central bulla. Not tender to touch. No concern for superimposed infection. No concern for SJS, TEN, TSS, tick borne illness, syphilis or other life-threatening condition.  Patient was given IV Benadryl and Pepcid, evaluated shortly afterwards reports improvement of symptoms and is requesting immediate discharge.  Will discharge patient with Pepcid Benadryl and EpiPen.  Patient was informed of precautions regarding EpiPen use and need to return to the emergency department if she uses it.  She reports that she has a primary care doctor who she will follow up with later on this week.  Patient currently is cool  compresses to help with her small bug bite, no evidence of infection requiring antibiotics at this time.   At this time there does not appear to be any evidence of an acute emergency medical condition and the patient appears stable for discharge with appropriate outpatient follow up. Diagnosis was discussed with patient who verbalizes understanding of care plan and is agreeable to discharge. I have discussed return precautions with patient who verbalizes understanding. Patient encouraged to follow-up with their PCP. All questions answered.   Note: Portions of this report may have been transcribed using voice recognition software. Every effort was made to ensure accuracy; however, inadvertent computerized transcription errors may still be present. Final Clinical Impression(s) / ED Diagnoses Final diagnoses:  Bug bite, initial encounter  Allergic reaction, initial encounter    Rx / DC Orders ED Discharge Orders         Ordered    diphenhydrAMINE (BENADRYL) 25 MG tablet  Every 6 hours PRN     Discontinue  Reprint     04/18/20 1626    EPINEPHrine (EPIPEN 2-PAK) 0.3 mg/0.3 mL IJ SOAJ injection  As needed     Discontinue  Reprint     04/18/20 1626    famotidine (PEPCID) 20 MG tablet  2 times daily     Discontinue  Reprint     04/18/20 1626           Gari Crown 04/18/20 1650    Lajean Saver, MD 04/19/20 [phone]

## 2020-05-12 ENCOUNTER — Other Ambulatory Visit: Payer: Self-pay | Admitting: Internal Medicine

## 2020-05-17 ENCOUNTER — Other Ambulatory Visit: Payer: Self-pay | Admitting: Internal Medicine

## 2020-05-17 DIAGNOSIS — E782 Mixed hyperlipidemia: Secondary | ICD-10-CM

## 2020-05-31 ENCOUNTER — Other Ambulatory Visit: Payer: Self-pay

## 2020-05-31 ENCOUNTER — Ambulatory Visit: Payer: Medicare Other | Attending: Internal Medicine | Admitting: Internal Medicine

## 2020-05-31 DIAGNOSIS — R0602 Shortness of breath: Secondary | ICD-10-CM

## 2020-05-31 DIAGNOSIS — R05 Cough: Secondary | ICD-10-CM

## 2020-05-31 DIAGNOSIS — R059 Cough, unspecified: Secondary | ICD-10-CM

## 2020-05-31 DIAGNOSIS — I10 Essential (primary) hypertension: Secondary | ICD-10-CM | POA: Diagnosis not present

## 2020-05-31 DIAGNOSIS — R0982 Postnasal drip: Secondary | ICD-10-CM | POA: Diagnosis not present

## 2020-05-31 DIAGNOSIS — E782 Mixed hyperlipidemia: Secondary | ICD-10-CM

## 2020-05-31 MED ORDER — ROSUVASTATIN CALCIUM 10 MG PO TABS: 10.0000 mg | ORAL_TABLET | Freq: Every day | ORAL | 3 refills | Status: DC

## 2020-05-31 MED ORDER — SPIRONOLACTONE 25 MG PO TABS: ORAL_TABLET | ORAL | 6 refills | Status: DC

## 2020-05-31 MED ORDER — AMLODIPINE BESYLATE 10 MG PO TABS: ORAL_TABLET | ORAL | 6 refills | Status: DC

## 2020-05-31 NOTE — Progress Notes (Signed)
Virtual Visit via Telephone Note Due to current restrictions/limitations of in-office visits due to the COVID-19 pandemic, this scheduled clinical appointment was converted to a telehealth visit  I connected with [name] on 05/31/20 at 10:33 a.m by telephone and verified that I am speaking with the correct person using two identifiers. I am in my office.  The patient is at home.  Only the patient and myself participated in this encounter.   I discussed the limitations, risks, security and privacy concerns of performing an evaluation and management service by telephone and the availability of in person appointments. I also discussed with the patient that there may be a patient responsible charge related to this service. The patient expressed understanding and agreed to proceed.   History of Present Illness: Pt with hx of HTN, chronic constipation,anal fistulas,vertigo, HL, chronic back pain (followed by pain specialist)and GERD.  Last evaluated 01/2020.  Purpose of today's visit is chronic disease management.  HL: On last visit, her LDL was not at goal.  I recommended that we try to change her to a different statin once she is finished with current bottle of simvastatin.  She did not tolerate Lipitor in the past.  It did cause some lip swelling.  HYPERTENSION Currently taking: see medication list Med Adherence: [x]  Yes    []  No Medication side effects: []  Yes    [x]  No Adherence with salt restriction: [x]  Yes    []  No Home Monitoring?: []  Yes    [x]  No but states that it is checked every time she goes to the appointment to see her pain specialist and readings have been good. Monitoring Frequency: []  Yes    []  No Home BP results range: []  Yes    []  No SOB? [x]  Yes -sometimes at night she feels she has to take a deep breath to get air into her lungs Chest Pain?: []  Yes    [x]  No Leg swelling?: []  Yes    [x]  No Headaches?: []  Yes    [x]  No Dizziness? []  Yes    [x]  No Comments:   Some  SOB at nights where she feels she suddenly has to take a deep breath to get air into her lungs. Sleeps on 1 pillow.  No PND.  No LE edema.  No long distance travel by plane or air within the past 6 weeks. + cough at nights x 1 mth.  No wheezing.  No fever or sore throat.  Takes Omeprazole in mornings but is is written for twice a day dosing.  Takes it at nights if she feels heartburn symptoms coming on. Outpatient Encounter Medications as of 05/31/2020  Medication Sig  . amLODipine (NORVASC) 10 MG tablet TAKE 1 TABLET(10 MG) BY MOUTH DAILY  . benzonatate (TESSALON) 100 MG capsule TAKE 1 CAPSULE BY MOUTH TWICE DAILY AS NEEDED (Patient not taking: Reported on 05/31/2020)  . Blood Pressure Monitor DEVI Use as directed to check home blood pressure 2-3 times a week  . cyclobenzaprine (FLEXERIL) 5 MG tablet Take 1 tablet (5 mg total) by mouth 2 (two) times daily as needed for muscle spasms.  . diclofenac sodium (VOLTAREN) 1 % GEL Apply 2 g topically 4 (four) times daily.  . diphenhydrAMINE (BENADRYL) 25 MG tablet Take 1 tablet (25 mg total) by mouth every 6 (six) hours as needed.  . DULoxetine (CYMBALTA) 30 MG capsule Take 1 capsule by mouth at bedtime.  EPINEPHrine (EPIPEN 2-PAK) 0.3 mg/0.3 mL IJ SOAJ injection Inject 0.3  mLs (0.3 mg total) into the muscle as needed for anaphylaxis.  . famotidine (PEPCID) 20 MG tablet Take 1 tablet (20 mg total) by mouth 2 (two) times daily for 5 days.  . fluticasone (FLONASE) 50 MCG/ACT nasal spray Place 1 spray into both nostrils daily.  Marland Kitchen linaclotide (LINZESS) 290 MCG CAPS capsule Take 1 capsule (290 mcg total) by mouth daily before breakfast.  . loratadine (CLARITIN) 10 MG tablet Take 1 tablet (10 mg total) by mouth daily.  . meclizine (ANTIVERT) 25 MG tablet Take 1 tablet (25 mg total) by mouth 3 (three) times daily as needed for dizziness.  Marland Kitchen omeprazole (PRILOSEC) 20 MG capsule Take 1 capsule (20 mg total) by mouth 2 (two) times daily before a meal.  . oxycodone  (OXY-IR) 5 MG capsule Take 5 mg by mouth every 8 (eight) hours as needed.  . simvastatin (ZOCOR) 10 MG tablet TAKE 1 TABLET(10 MG) BY MOUTH AT BEDTIME  . spironolactone (ALDACTONE) 25 MG tablet TAKE 1/2 TABLET(12.5 MG) BY MOUTH DAILY   No facility-administered encounter medications on file as of 05/31/2020.      Observations/Objective: Results for orders placed or performed in visit on 01/28/20  CBC  Result Value Ref Range   WBC 4.9 3.4 - 10.8 x10E3/uL   RBC 4.27 3.77 - 5.28 x10E6/uL   Hemoglobin 13.0 11.1 - 15.9 g/dL   Hematocrit 38.9 37.3 - 46.6 %   MCV 90 79 - 97 fL   MCH 30.4 26.6 - 33.0 pg   MCHC 34.0 31 - 35 g/dL   RDW 42.8 76.8 - 11.5 %   Platelets 288 150 - 450 x10E3/uL  Comprehensive metabolic panel  Result Value Ref Range   Glucose 91 65 - 99 mg/dL   BUN 7 6 - 24 mg/dL   Creatinine, Ser 7.26 0.57 - 1.00 mg/dL   GFR calc non Af Amer 78 >59 mL/min/1.73   GFR calc Af Amer 90 >59 mL/min/1.73   BUN/Creatinine Ratio 8 (L) 9 - 23   Sodium 144 134 - 144 mmol/L   Potassium 4.1 3.5 - 5.2 mmol/L   Chloride 103 96 - 106 mmol/L   CO2 27 20 - 29 mmol/L   Calcium 9.7 8.7 - 10.2 mg/dL   Total Protein 7.2 6.0 - 8.5 g/dL   Albumin 4.4 3.8 - 4.9 g/dL   Globulin, Total 2.8 1.5 - 4.5 g/dL   Albumin/Globulin Ratio 1.6 1.2 - 2.2   Bilirubin Total <0.2 0.0 - 1.2 mg/dL   Alkaline Phosphatase 83 39 - 117 IU/L   AST 17 0 - 40 IU/L   ALT 8 0 - 32 IU/L  Lipid panel  Result Value Ref Range   Cholesterol, Total 199 100 - 199 mg/dL   Triglycerides 203 0 - 149 mg/dL   HDL 60 >55 mg/dL   VLDL Cholesterol Cal 21 5 - 40 mg/dL   LDL Chol Calc (NIH) 974 (H) 0 - 99 mg/dL   Chol/HDL Ratio 3.3 0.0 - 4.4 ratio     Assessment and Plan: 1. Essential hypertension Patient reports her blood pressure readings have been good.  She will continue her current medications and low-salt diet - spironolactone (ALDACTONE) 25 MG tablet; TAKE 1/2 TABLET(12.5 MG) BY MOUTH DAILY  Dispense: 30 tablet; Refill:  6 - amLODipine (NORVASC) 10 MG tablet; TAKE 1 TABLET(10 MG) BY MOUTH DAILY  Dispense: 30 tablet; Refill: 6  2. Post-nasal drip 3. Cough 4. Shortness of breath -Differential diagnosis of cough and sometimes shortness of  breath at night could be asthma, anxiety, GERD or postnasal drip.  I recommend that she takes the omeprazole twice a day and make sure to eat her last meal at least 2 to 3 hours before laying down at nights.  She will try taking the Flonase at nights.  Continue Claritin.  If symptoms persist, advised that she follow-up with me in person for urgent care visit for further evaluation.  5. Mixed hyperlipidemia Change simvastatin to Crestor for better control of cholesterol and also has less interaction with amlodipine compared to simvastatin. - rosuvastatin (CRESTOR) 10 MG tablet; Take 1 tablet (10 mg total) by mouth daily. Stop Simvastatin  Dispense: 30 tablet; Refill: 3   Follow Up Instructions: 4 mths or sooner if symptoms persist. Give appt with my NP for Medicare Wellness Visit   I discussed the assessment and treatment plan with the patient. The patient was provided an opportunity to ask questions and all were answered. The patient agreed with the plan and demonstrated an understanding of the instructions.   The patient was advised to call back or seek an in-person evaluation if the symptoms worsen or if the condition fails to improve as anticipated.  I provided 16 minutes of non-face-to-face time during this encounter.   Jonah Blue, MD

## 2020-05-31 NOTE — Progress Notes (Signed)
Pt states sometimes she has to a deep breathe to breath

## 2020-06-01 ENCOUNTER — Telehealth: Payer: Self-pay | Admitting: Internal Medicine

## 2020-06-01 MED ORDER — ALBUTEROL SULFATE HFA 108 (90 BASE) MCG/ACT IN AERS: 2.0000 | INHALATION_SPRAY | Freq: Four times a day (QID) | RESPIRATORY_TRACT | 1 refills | Status: DC | PRN

## 2020-06-01 NOTE — Telephone Encounter (Signed)
Contacted pt and made aware that inhaler has been sent to pharmacy

## 2020-06-01 NOTE — Telephone Encounter (Signed)
Patient called to inform the doctor that she has not called in her inhaler to the pharmacy like they discussed.  Please advise and let patient know when she would be able to get the inhaler at [phone]

## 2020-06-01 NOTE — Telephone Encounter (Signed)
Will forward to pcp

## 2020-06-07 DIAGNOSIS — G90511 Complex regional pain syndrome I of right upper limb: Secondary | ICD-10-CM | POA: Diagnosis not present

## 2020-06-07 DIAGNOSIS — G894 Chronic pain syndrome: Secondary | ICD-10-CM | POA: Diagnosis not present

## 2020-06-07 DIAGNOSIS — M47812 Spondylosis without myelopathy or radiculopathy, cervical region: Secondary | ICD-10-CM | POA: Diagnosis not present

## 2020-06-07 DIAGNOSIS — K5903 Drug induced constipation: Secondary | ICD-10-CM | POA: Diagnosis not present

## 2020-06-16 ENCOUNTER — Other Ambulatory Visit: Payer: Self-pay | Admitting: Internal Medicine

## 2020-06-16 DIAGNOSIS — K219 Gastro-esophageal reflux disease without esophagitis: Secondary | ICD-10-CM

## 2020-06-27 ENCOUNTER — Encounter: Payer: Self-pay | Admitting: Family

## 2020-06-27 ENCOUNTER — Ambulatory Visit: Payer: Medicare Other | Attending: Family | Admitting: Family

## 2020-06-27 ENCOUNTER — Other Ambulatory Visit: Payer: Self-pay

## 2020-06-27 VITALS — BP 125/89 | HR 81 | Temp 97.7°F | Resp 16 | Ht 64.0 in | Wt 145.0 lb

## 2020-06-27 DIAGNOSIS — Z23 Encounter for immunization: Secondary | ICD-10-CM | POA: Diagnosis not present

## 2020-06-27 DIAGNOSIS — R05 Cough: Secondary | ICD-10-CM | POA: Diagnosis not present

## 2020-06-27 DIAGNOSIS — R0982 Postnasal drip: Secondary | ICD-10-CM

## 2020-06-27 DIAGNOSIS — Z0000 Encounter for general adult medical examination without abnormal findings: Secondary | ICD-10-CM

## 2020-06-27 DIAGNOSIS — R059 Cough, unspecified: Secondary | ICD-10-CM

## 2020-06-27 MED ORDER — FLUTICASONE PROPIONATE 50 MCG/ACT NA SUSP: 1.0000 | Freq: Every day | NASAL | 2 refills | Status: DC

## 2020-06-27 MED ORDER — BENZONATATE 100 MG PO CAPS: ORAL_CAPSULE | ORAL | 0 refills | Status: DC

## 2020-06-27 NOTE — Patient Instructions (Signed)
  Ms. Vandenberg , Thank you for taking time to come for your Medicare Wellness Visit. I appreciate your ongoing commitment to your health goals. Please review the following plan we discussed and let me know if I can assist you in the future.   These are the goals we discussed: Goals   None    "I would like to improve my back pain."  This is a list of the screening recommended for you and due dates:  - Flu vaccine administered today 06/27/2020.  Health Maintenance  Topic Date Due  . Flu Shot  06/05/2020  . Mammogram  11/10/2020  . Pap Smear  04/03/2021  . Colon Cancer Screening  03/17/2025  . Tetanus Vaccine  10/13/2028  . COVID-19 Vaccine  Completed  .  Hepatitis C: One time screening is recommended by Center for Disease Control  (CDC) for  adults born from [birth year] through 1967.   Completed  . HIV Screening  Completed

## 2020-06-27 NOTE — Progress Notes (Signed)
Medication refill  1Computer  Shoe 2Ball    Dlorw

## 2020-06-27 NOTE — Progress Notes (Signed)
Medicare Annual Wellness Visit  [name] is a 54 y.o. female who presents for Medicare Annual Wellness Visit.   Patient Active Problem List   Diagnosis Date Noted  . Altered consciousness 12/10/2018  . Carpal tunnel syndrome of right wrist 06/12/2018  . Postnasal drip 06/12/2018  . Vertigo 10/18/2016  . Right arm pain 04/19/2014  . Disturbance of skin sensation 04/19/2014  . Anal fistula 04/14/2013  . UNSPECIFIED SITE OF SPRAIN AND STRAIN 07/07/2009  . PEDAL EDEMA 06/29/2009  . MUSCLE PAIN 06/23/2009  . 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 Allergic rhinitis 03/11/2009  . SYNCOPE, VASOVAGAL 03/11/2009  . HEADACHE 03/11/2009    Health Maintenance Due  Topic Date Due  . INFLUENZA VACCINE  06/05/2020  - Influenza vaccine to be administered today in clinic.   Health Habits Exercise: rarely Diet: reports she does not eat salt; eating more veggies; drinking tea, Kool Aid, water, and denies alcohol consumption. Smoking: denies currently smoking and no smoking in the past   Health Related Goal To Accomplish In Next Year - "Improve back problem."  Reports she has an appointment with back doctor on 07/12/2020.   Depression Screen Over the past two weeks have you:     Felt down or depressed? no     Had little interest or pleasure in doing things? no   Denies thoughts of self-harm and suicidal ideation   Functional Ability Does the patient need help with: Myrtis Ser index)         Bathing: no         Dressing : no         Toileting: no         Transferring: no         Continence: No         Feeding: no           Safety Screen Does the home have:         Rugs in the hallway: no          Grab bars in the bathroom: no         Handrails on the stairs:no         Stairs in home: no         Poor lightning: no  Hearing Evaluation Do you have trouble hearing the television when others do not? no and reports right ear does have decreased hearing and she has seen an ear doctor for this,  does not wear hearing aids  Do you have to strain to hear/understand conversations? no   Falls Risk: Does the patient need assistance with ambulation? no Does the patient have a history of a fall in the last 90 days? no Is the patient at risk for falls? no Was the patient's timed "Get Up and Go Test" unsteady or longer than 30 seconds? No, 6 seconds  Reports she does have a walking problem because of bulging disc and right arm pain.  Advanced Care Planning Patient has executed an Advance Directive: no If no, patient was given the opportunity to execute an Advance Directive today? yes This patient has the ability to prepare an Advance Directive: yes Provider is willing to follow the patient's wishes: yes Discussed with patient what is a Stage manager. I went over with her Living Will and Healthcare Power of Snyder. Patient given written packet also. She will look over it and consider executing a Living Will.  If she does, I requested that she brings a copy  of it for our records. Patient verbalized understanding.   Cognitive Assessment Does the patient have evidence of cognitive impairment? No The patient does not have evidence of a change in mood/affect, appearance, speech, memory or motor skills.  Vision and Dental Screening Vision screen performed. See vision activity in chart for documentation. Reports she had Ophthalmology exam in 2021 and that she does wear glasses. Cannot recall providers name.  Reports has an appointment with Dentist on 07/14/2020. Cannot recall providers name.  Plan: During the course of the visit the patient was educated and counseled about appropriate screening and preventive services including: Influenza vaccine Nutrition counseling  Advanced directives: has NO advanced directive  - add't info requested. Referral to SW: patient will discuss with family and return to primary physician once complete. .   The following orders were placed at  today's visit; 1. Medicare annual wellness visit, initial: - Patient presents today for Medicare annual wellness visit.   2. Flu vaccine need: - Influenza vaccine administered today in clinic.  - Flu Vaccine QUAD 36+ mos IM (Fluarix & Fluzone Quad PF  3. Need for shingles vaccine: - Need for shingles vaccine. Patient prefers to wait until next visit for this as she is getting influenza vaccine today.  4. Post-nasal drip: 5. Cough: - Continue Fluticasone and Benzonatate as prescribed.  - Follow-up with primary physician as needed. - fluticasone (FLONASE) 50 MCG/ACT nasal spray; Place 1 spray into both nostrils daily.  Dispense: 16 g; Refill: 2 - benzonatate (TESSALON) 100 MG capsule; TAKE 1 CAPSULE BY MOUTH TWICE DAILY AS NEEDED  Dispense: 30 capsule; Refill: 0   An after visit summary with all of these plans was given to the patient.

## 2020-07-05 ENCOUNTER — Other Ambulatory Visit: Payer: Self-pay

## 2020-07-05 ENCOUNTER — Ambulatory Visit: Payer: Medicare Other | Attending: Internal Medicine | Admitting: Internal Medicine

## 2020-07-05 DIAGNOSIS — N951 Menopausal and female climacteric states: Secondary | ICD-10-CM

## 2020-07-05 DIAGNOSIS — M25551 Pain in right hip: Secondary | ICD-10-CM

## 2020-07-05 MED ORDER — KETOROLAC TROMETHAMINE 60 MG/2ML IM SOLN
60.0000 mg | Freq: Once | INTRAMUSCULAR | Status: AC
  Administered 2020-07-05: 60 mg via INTRAMUSCULAR

## 2020-07-05 MED ORDER — GABAPENTIN 300 MG PO CAPS: 300.0000 mg | ORAL_CAPSULE | Freq: Every day | ORAL | 3 refills | Status: AC

## 2020-07-05 MED ORDER — KETOROLAC TROMETHAMINE 30 MG/ML IJ SOLN: 30.0000 mg | Freq: Once | INTRAMUSCULAR | Status: DC

## 2020-07-05 NOTE — Progress Notes (Signed)
Pt states she is having some discomfort in her right leg

## 2020-07-05 NOTE — Progress Notes (Signed)
Virtual Visit via Telephone Note Due to current restrictions/limitations of in-office visits due to the COVID-19 pandemic, this scheduled clinical appointment was converted to a telehealth visit  I connected with [name] on 07/05/20 at 11:05 a.m by telephone and verified that I am speaking with the correct person using two identifiers. I am in my office.  The patient is at home.  Only the patient and myself participated in this encounter.  I discussed the limitations, risks, security and privacy concerns of performing an evaluation and management service by telephone and the availability of in person appointments. I also discussed with the patient that there may be a patient responsible charge related to this service. The patient expressed understanding and agreed to proceed.   History of Present Illness: Pt with hx of HTN, chronic constipation,anal fistulas,vertigo, HL, chronic back pain (followed by pain specialist)and GERD.  This was a urgent care visit for right hip pain.  Pt c/o bad hot flashes for years.  She states she did speak with her gyn a while back but was not prescribed anything.  She has an upcoming appt with her GYN next mth.  Tried Estroven OTC but she broke out in rash.  No fhx of breast or ovarian cancer.  She is up to date with [facility].    C/o pain in RT hip that started this morning.  Constant.  Fell on both knees yesterday but most of her wgh was on the right side.  She was running to answer a call from her mom when her leg got caught in clothing causing her to fall.  She has not taken anything for it.  She can bear wgh but has to limp. She is on Oxycodone through her pain specialist but has not taken as yet this a.m.  She would like to get a Toradol shot.  Outpatient Encounter Medications as of 07/05/2020  Medication Sig  . albuterol (VENTOLIN HFA) 108 (90 Base) MCG/ACT inhaler Inhale 2 puffs into the lungs every 6 (six) hours as needed for wheezing or shortness of breath.   Marland Kitchen amLODipine (NORVASC) 10 MG tablet TAKE 1 TABLET(10 MG) BY MOUTH DAILY  . benzonatate (TESSALON) 100 MG capsule TAKE 1 CAPSULE BY MOUTH TWICE DAILY AS NEEDED (Patient not taking: Reported on 07/05/2020)  . Blood Pressure Monitor DEVI Use as directed to check home blood pressure 2-3 times a week  . diclofenac sodium (VOLTAREN) 1 % GEL Apply 2 g topically 4 (four) times daily.  . diphenhydrAMINE (BENADRYL) 25 MG tablet Take 1 tablet (25 mg total) by mouth every 6 (six) hours as needed.  . DULoxetine (CYMBALTA) 30 MG capsule Take 1 capsule by mouth at bedtime.  Marland Kitchen EPINEPHrine (EPIPEN 2-PAK) 0.3 mg/0.3 mL IJ SOAJ injection Inject 0.3 mLs (0.3 mg total) into the muscle as needed for anaphylaxis.  . fluticasone (FLONASE) 50 MCG/ACT nasal spray Place 1 spray into both nostrils daily.  Marland Kitchen linaclotide (LINZESS) 290 MCG CAPS capsule Take 1 capsule (290 mcg total) by mouth daily before breakfast.  . loratadine (CLARITIN) 10 MG tablet Take 1 tablet (10 mg total) by mouth daily.  Marland Kitchen omeprazole (PRILOSEC) 20 MG capsule TAKE 1 CAPSULE(20 MG) BY MOUTH TWICE DAILY BEFORE A MEAL  . oxycodone (OXY-IR) 5 MG capsule Take 5 mg by mouth every 8 (eight) hours as needed.  . rosuvastatin (CRESTOR) 10 MG tablet Take 1 tablet (10 mg total) by mouth daily. Stop Simvastatin  . simvastatin (ZOCOR) 10 MG tablet TAKE 1 TABLET(10 MG)  BY MOUTH AT BEDTIME (Patient not taking: Reported on 06/27/2020)  . spironolactone (ALDACTONE) 25 MG tablet TAKE 1/2 TABLET(12.5 MG) BY MOUTH DAILY (Patient not taking: Reported on 06/27/2020)   No facility-administered encounter medications on file as of 07/05/2020.      Observations/Objective: No direct observation done as this was a telephone encounter.  Assessment and Plan: 1. Hot flashes due to menopause Discussed medications that can be used to help decrease hot flashes including HRT versus non-HRT.  If she would like to pursue HRT, I recommend that she speak with her gynecologist when she  sees her next month.  In the meantime we can try her on gabapentin at bedtime.  Patient is agreeable to giving the medication a try. - gabapentin (NEURONTIN) 300 MG capsule; Take 1 capsule (300 mg total) by mouth at bedtime.  Dispense: 30 capsule; Refill: 3  2. Acute pain of right hip She will come in for nurse only visit today to get Toradol shot. Advised to rest the leg for about 2 days.  If pain persist she should go and have x-rays done.  I have put in the order for the x-ray in the event she needs to get the x-rays. - DG Hip Unilat W OR W/O Pelvis Min 4 Views Right; Future - ketorolac (TORADOL) 60 MG/2ML injection 30 mg   Follow Up Instructions: PRN   I discussed the assessment and treatment plan with the patient. The patient was provided an opportunity to ask questions and all were answered. The patient agreed with the plan and demonstrated an understanding of the instructions.   The patient was advised to call back or seek an in-person evaluation if the symptoms worsen or if the condition fails to improve as anticipated.  I provided 10 minutes of non-face-to-face time during this encounter.   Jonah Blue, MD

## 2020-07-18 DIAGNOSIS — M5416 Radiculopathy, lumbar region: Secondary | ICD-10-CM | POA: Diagnosis not present

## 2020-08-05 ENCOUNTER — Other Ambulatory Visit: Payer: Self-pay | Admitting: Orthopedic Surgery

## 2020-08-05 DIAGNOSIS — M533 Sacrococcygeal disorders, not elsewhere classified: Secondary | ICD-10-CM

## 2020-08-12 ENCOUNTER — Telehealth: Payer: Self-pay | Admitting: Internal Medicine

## 2020-08-12 MED ORDER — BENZONATATE 100 MG PO CAPS: 100.0000 mg | ORAL_CAPSULE | Freq: Two times a day (BID) | ORAL | 1 refills | Status: DC | PRN

## 2020-08-12 NOTE — Telephone Encounter (Signed)
PT IS REQUESTING BENZONATATE 100 MG CAPSULES REFILL FOR COUGH  FROM [facility].

## 2020-08-12 NOTE — Telephone Encounter (Signed)
Will forward to pcp

## 2020-08-18 ENCOUNTER — Other Ambulatory Visit: Payer: Medicare Other

## 2020-08-18 ENCOUNTER — Inpatient Hospital Stay: Admission: RE | Admit: 2020-08-18 | Payer: Medicare Other | Source: Ambulatory Visit

## 2020-08-22 ENCOUNTER — Other Ambulatory Visit: Payer: Self-pay

## 2020-08-22 ENCOUNTER — Ambulatory Visit (HOSPITAL_COMMUNITY)
Admission: EM | Admit: 2020-08-22 | Discharge: 2020-08-22 | Disposition: A | Discharge status: Home / Self Care | Payer: Medicare HMO | Attending: Family Medicine | Admitting: Family Medicine

## 2020-08-22 ENCOUNTER — Encounter (HOSPITAL_COMMUNITY): Payer: Self-pay | Admitting: *Deleted

## 2020-08-22 ENCOUNTER — Telehealth: Payer: Self-pay | Admitting: Internal Medicine

## 2020-08-22 DIAGNOSIS — L0291 Cutaneous abscess, unspecified: Secondary | ICD-10-CM | POA: Diagnosis not present

## 2020-08-22 MED ORDER — DOXYCYCLINE HYCLATE 100 MG PO CAPS: 100.0000 mg | ORAL_CAPSULE | Freq: Two times a day (BID) | ORAL | 0 refills | Status: AC

## 2020-08-22 NOTE — Telephone Encounter (Signed)
Contacted patient and offered her to go to the mobile unit to be seen and she said that the wait time was to long. Offered an appt for Wednesday with Dr. Jillyn Hidden and patient stated that she was in pain and had to be seen soon. Advised patient to go to Urgent Care.    Copied from CRM [id number]. Topic: Appointment Scheduling - Scheduling Inquiry for Clinic >> Aug 22, 2020  9:43 AM Crist Infante wrote: Reason for CRM: pt has found red bump on right side breast, and it appears more may be coming. Very painful and red.  Pt asked if Dr Laural Benes will work her in? If not anyone will do. Please advise

## 2020-08-22 NOTE — Discharge Instructions (Addendum)
Antibiotics as prescribed.  Warm compresses to the area 2-3 times a day for 10 to 15 minutes at a time. Follow up as needed for continued or worsening symptoms

## 2020-08-22 NOTE — ED Triage Notes (Signed)
Pt has red skin area on Lateral side of RT breast. Surrounding skin red and painful to touch.

## 2020-08-22 NOTE — Telephone Encounter (Signed)
Will forward to pcp

## 2020-08-23 NOTE — ED Provider Notes (Signed)
[facility]    CSN: [id number] Arrival date & time: 08/22/20  1218      History   Chief Complaint Chief Complaint  Patient presents with  . Recurrent Skin Infections    HPI [name] is a 54 y.o. female.   Pt is a 54 year old female that presents with abscess to the left breast. This has been present for the past few days. Located to right lateral breast. erythemetous and tender to touch. No drainage of fever. No hx of MRSA.      Past Medical History:  Diagnosis Date  . 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 Anemia   . Constipation   . GERD (gastroesophageal reflux disease)   . Hemorrhoids   . HTN (hypertension)   . Hyperlipidemia   . Seasonal allergies 08-13-13   tx. Claritin  . Vertigo     Patient Active Problem List   Diagnosis Date Noted  . Altered consciousness 12/10/2018  . Carpal tunnel syndrome of right wrist 06/12/2018  . Postnasal drip 06/12/2018  . Vertigo 10/18/2016  . Right arm pain 04/19/2014  . Disturbance of skin sensation 04/19/2014  . Anal fistula 04/14/2013  . UNSPECIFIED SITE OF SPRAIN AND STRAIN 07/07/2009  . PEDAL EDEMA 06/29/2009  . MUSCLE PAIN 06/23/2009  . 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 Allergic rhinitis 03/11/2009  . SYNCOPE, VASOVAGAL 03/11/2009  . HEADACHE 03/11/2009    Past Surgical History:  Procedure Laterality Date  . ANAL FISTULECTOMY  08/22/2012   Procedure: FISTULECTOMY ANAL;  Surgeon: Lodema Pilot, DO;  Location: [facility];  Service: General;  Laterality: N/A;  rectal examination under anesthesia with seton placement   . ANAL FISTULECTOMY  09/17/2012   Procedure: FISTULECTOMY ANAL;  Surgeon: Lodema Pilot, DO;  Location: [facility];  Service: General;  Laterality: N/A;  possible fistulotomy  . D & C HYSTEROSCOPY/ NOVASURE ENDOMETRIAL ABLATION/ I & D THROMBOSED HEMORROID  01-10-2007  DR Miguel Aschoff  . EXAMINATION UNDER ANESTHESIA  09/17/2012   Procedure: EXAM UNDER ANESTHESIA;  Surgeon: Lodema Pilot, DO;   Location: [facility];  Service: General;  Laterality: N/A;  Rectal exam under anesthesia  . EXAMINATION UNDER ANESTHESIA N/A 05/14/2013   Procedure: EXAM UNDER ANESTHESIA;  Surgeon: Romie Levee, MD;  Location: [facility];  Service: General;  Laterality: N/A;  . FISTULA PLUG  09/17/2012   Procedure: FISTULA PLUG;  Surgeon: Lodema Pilot, DO;  Location: [facility];  Service: General;  Laterality: N/A;  Possible fistula plug  . HEMORRHOID SURGERY  04-28-2009   [facility] INTERNAL HEMORRHOIDS  . IRRIGATION AND DEBRIDEMENT ABSCESS  07/08/2012   Procedure: MINOR INCISION AND DRAINAGE OF ABSCESS;  Surgeon: Geryl Rankins, MD;  Location: [facility];  Service: Gynecology;  Laterality: N/A;  Vulvar Abscess  . PLACEMENT OF SETON N/A 05/14/2013   Procedure:  PLACEMENT OF SETON;  Surgeon: Romie Levee, MD;  Location: [facility];  Service: General;  Laterality: N/A;  . RECTAL EXAM UNDER ANESTHESIA N/A 08/20/2013   Procedure: mucosal advancement flap ;  Surgeon: Romie Levee, MD;  Location: [facility];  Service: General;  Laterality: N/A;  parks anal retractor long rectal instrucments prone jack knife anal fistula   . RECTAL ULTRASOUND N/A 07/20/2013   Procedure: RECTAL ULTRASOUND;  Surgeon: Romie Levee, MD;  Location: [facility];  Service: Endoscopy;  Laterality: N/A;    OB History   No obstetric history on file.      Home  Medications    Prior to Admission medications   Medication Sig Start Date End Date Taking? Authorizing Provider  amLODipine (NORVASC) 10 MG tablet TAKE 1 TABLET(10 MG) BY MOUTH DAILY 05/31/20  Yes Marcine Matar, MD  benzonatate (TESSALON PERLES) 100 MG capsule Take 1 capsule (100 mg total) by mouth 2 (two) times daily as needed for cough. 08/12/20  Yes Marcine Matar, MD  diclofenac sodium (VOLTAREN) 1 % GEL Apply 2 g topically 4 (four) times daily. 04/01/19  Yes [place] Scrape, Alyssa B, PA-C  DULoxetine (CYMBALTA)  30 MG capsule Take 1 capsule by mouth at bedtime.   Yes [provider]  fluticasone (FLONASE) 50 MCG/ACT nasal spray Place 1 spray into both nostrils daily. 06/27/20  Yes Rema Fendt, NP  linaclotide [facility]) 290 MCG CAPS capsule Take 1 capsule (290 mcg total) by mouth daily before breakfast. 03/24/20  Yes Marcine Matar, MD  loratadine (CLARITIN) 10 MG tablet Take 1 tablet (10 mg total) by mouth daily. 01/28/20  Yes Marcine Matar, MD  omeprazole (PRILOSEC) 20 MG capsule TAKE 1 CAPSULE(20 MG) BY MOUTH TWICE DAILY BEFORE A MEAL 06/16/20  Yes Marcine Matar, MD  rosuvastatin (CRESTOR) 10 MG tablet Take 1 tablet (10 mg total) by mouth daily. Stop Simvastatin 05/31/20  Yes Marcine Matar, MD  albuterol (VENTOLIN HFA) 108 (90 Base) MCG/ACT inhaler Inhale 2 puffs into the lungs every 6 (six) hours as needed for wheezing or shortness of breath. 06/01/20   Marcine Matar, MD  Blood Pressure Monitor DEVI Use as directed to check home blood pressure 2-3 times a week 03/10/19   Marcine Matar, MD  diphenhydrAMINE (BENADRYL) 25 MG tablet Take 1 tablet (25 mg total) by mouth every 6 (six) hours as needed. 04/18/20   Harlene Salts A, PA-C  doxycycline (VIBRAMYCIN) 100 MG capsule Take 1 capsule (100 mg total) by mouth 2 (two) times daily for 7 days. 08/22/20 08/29/20  Dahlia Byes A, NP  EPINEPHrine (EPIPEN 2-PAK) 0.3 mg/0.3 mL IJ SOAJ injection Inject 0.3 mLs (0.3 mg total) into the muscle as needed for anaphylaxis. 04/18/20   Harlene Salts A, PA-C  gabapentin (NEURONTIN) 300 MG capsule Take 1 capsule (300 mg total) by mouth at bedtime. 07/05/20   Marcine Matar, MD  oxycodone (OXY-IR) 5 MG capsule Take 5 mg by mouth every 8 (eight) hours as needed.    [provider]  simvastatin (ZOCOR) 10 MG tablet TAKE 1 TABLET(10 MG) BY MOUTH AT BEDTIME Patient not taking: Reported on 06/27/2020 05/17/20   Marcine Matar, MD  spironolactone (ALDACTONE) 25 MG tablet TAKE 1/2  TABLET(12.5 MG) BY MOUTH DAILY Patient not taking: Reported on 06/27/2020 05/31/20   Marcine Matar, MD    Family History Family History  Problem Relation Age of Onset  . Diabetes Mother   . Heart attack Father   . Diabetes Brother   . Diabetes Maternal Uncle   . Diabetes Maternal Aunt   . Seizures Sister   . Colon cancer Neg Hx   . Stomach cancer Neg Hx     Social History Social History   Tobacco Use  . Smoking status: Never Smoker  . Smokeless tobacco: Never Used  Substance Use Topics  . Alcohol use: Yes    Alcohol/week: 0.0 standard drinks    Comment: occa  . Drug use: No     Allergies   Ampicillin, Atorvastatin calcium, Penicillins, Sulfonamide derivatives, Hydrocodone-acetaminophen, Tramadol, and Tylenol [acetaminophen]   Review  of Systems Review of Systems   Physical Exam Triage Vital Signs ED Triage Vitals  Enc Vitals Group     BP 08/22/20 1315 127/79     Pulse Rate 08/22/20 1315 86     Resp 08/22/20 1315 16     Temp 08/22/20 1315 97.7 F (36.5 C)     Temp Source 08/22/20 1315 Oral     SpO2 08/22/20 1315 100 %     Weight 08/22/20 1316 154 lb (69.9 kg)     Height 08/22/20 1316 5\' 4"  (1.626 m)     Head Circumference --      Peak Flow --      Pain Score 08/22/20 1314 6     Pain Loc --      Pain Edu? --      Excl. in GC? --    No data found.  Updated Vital Signs BP 127/79 (BP Location: Right Arm)   Pulse 86   Temp 97.7 F (36.5 C) (Oral)   Resp 16   Ht 5\' 4"  (1.626 m)   Wt 154 lb (69.9 kg)   SpO2 100%   BMI 26.43 kg/m   Visual Acuity Right Eye Distance:   Left Eye Distance:   Bilateral Distance:    Right Eye Near:   Left Eye Near:    Bilateral Near:     Physical Exam Vitals and nursing note reviewed.  Constitutional:      General: She is not in acute distress.    Appearance: Normal appearance. She is not ill-appearing, toxic-appearing or diaphoretic.  HENT:     Head: Normocephalic.     Nose: Nose normal.  Eyes:      Conjunctiva/sclera: Conjunctivae normal.  Pulmonary:     Effort: Pulmonary effort is normal.  Chest:     Breasts:        Right: Skin change and tenderness present. No swelling, bleeding, inverted nipple, mass or nipple discharge.     Musculoskeletal:        General: Normal range of motion.     Cervical back: Normal range of motion.  Skin:    General: Skin is warm and dry.     Findings: No rash.  Neurological:     Mental Status: She is alert.  Psychiatric:        Mood and Affect: Mood normal.      UC Treatments / Results  Labs (all labs ordered are listed, but only abnormal results are displayed) Labs Reviewed - No data to display  EKG   Radiology No results found.  Procedures Procedures (including critical care time)  Medications Ordered in UC Medications - No data to display  Initial Impression / Assessment and Plan / UC Course  I have reviewed the triage vital signs and the nursing notes.  Pertinent labs & imaging results that were available during my care of the patient were reviewed by me and considered in my medical decision making (see chart for details).     Right breast abscess Treated with doxycycline based on penicillin allergy Recommend warm compresses.[email or web address]  Final Clinical Impressions(s) / UC Diagnoses   Final diagnoses:  Abscess     Discharge Instructions     Antibiotics as prescribed.  Warm compresses to the area 2-3 times a day for 10 to 15 minutes at a time. Follow up as needed for continued or worsening symptoms     ED Prescriptions    Medication Sig Dispense Auth. Provider   doxycycline (VIBRAMYCIN) 100  MG capsule Take 1 capsule (100 mg total) by mouth 2 (two) times daily for 7 days. 14 capsule Shayonna Ocampo A, NP     PDMP not reviewed this encounter.   Janace ArisBast, Ky Moskowitz A, NP 08/23/20 [phone]

## 2020-09-01 ENCOUNTER — Ambulatory Visit
Admission: RE | Admit: 2020-09-01 | Discharge: 2020-09-01 | Disposition: A | Discharge status: Home / Self Care | Payer: Medicare HMO | Source: Ambulatory Visit | Attending: Orthopedic Surgery | Admitting: Orthopedic Surgery

## 2020-09-01 ENCOUNTER — Other Ambulatory Visit: Payer: Self-pay

## 2020-09-01 DIAGNOSIS — M533 Sacrococcygeal disorders, not elsewhere classified: Secondary | ICD-10-CM | POA: Diagnosis not present

## 2020-09-14 DIAGNOSIS — Z6826 Body mass index (BMI) 26.0-26.9, adult: Secondary | ICD-10-CM | POA: Diagnosis not present

## 2020-09-14 DIAGNOSIS — Z01419 Encounter for gynecological examination (general) (routine) without abnormal findings: Secondary | ICD-10-CM | POA: Diagnosis not present

## 2020-09-14 DIAGNOSIS — H9319 Tinnitus, unspecified ear: Secondary | ICD-10-CM | POA: Diagnosis not present

## 2020-09-14 DIAGNOSIS — N952 Postmenopausal atrophic vaginitis: Secondary | ICD-10-CM | POA: Diagnosis not present

## 2020-09-19 ENCOUNTER — Telehealth: Payer: Self-pay | Admitting: Internal Medicine

## 2020-09-19 ENCOUNTER — Telehealth: Payer: Self-pay

## 2020-09-19 NOTE — Telephone Encounter (Signed)
[name] can you please assist patient.   Copied from CRM [id number]. Topic: General - Other >> Sep 19, 2020  3:52 PM [name] wrote: Reason for CRM: Patient is needing help finding a dentist that will except her insurance. She can be reached at [phone]. Please advise

## 2020-09-19 NOTE — Telephone Encounter (Signed)
What medications is pt needing refilled? There are no medications in note

## 2020-09-20 DIAGNOSIS — M5416 Radiculopathy, lumbar region: Secondary | ICD-10-CM | POA: Diagnosis not present

## 2020-09-20 DIAGNOSIS — M1611 Unilateral primary osteoarthritis, right hip: Secondary | ICD-10-CM | POA: Diagnosis not present

## 2020-09-20 DIAGNOSIS — M461 Sacroiliitis, not elsewhere classified: Secondary | ICD-10-CM | POA: Diagnosis not present

## 2020-09-20 MED ORDER — BENZONATATE 100 MG PO CAPS: 100.0000 mg | ORAL_CAPSULE | Freq: Two times a day (BID) | ORAL | 1 refills | Status: DC | PRN

## 2020-09-20 NOTE — Addendum Note (Signed)
Addended by: Jonah Blue B on: 09/20/2020 05:07 PM   Modules accepted: Orders

## 2020-09-20 NOTE — Telephone Encounter (Signed)
Will forward to pcp

## 2020-09-20 NOTE — Telephone Encounter (Signed)
Patient checking on the status of message below °

## 2020-09-20 NOTE — Telephone Encounter (Signed)
Refill sent on Tessalon Perles.

## 2020-09-21 NOTE — Telephone Encounter (Signed)
I spoke to patient and I told her to contact [facility] and they will sent her a list . Thank you

## 2020-09-22 DIAGNOSIS — M1611 Unilateral primary osteoarthritis, right hip: Secondary | ICD-10-CM | POA: Diagnosis not present

## 2020-09-23 DIAGNOSIS — M79604 Pain in right leg: Secondary | ICD-10-CM | POA: Diagnosis not present

## 2020-09-23 DIAGNOSIS — M6281 Muscle weakness (generalized): Secondary | ICD-10-CM | POA: Diagnosis not present

## 2020-09-23 DIAGNOSIS — R29898 Other symptoms and signs involving the musculoskeletal system: Secondary | ICD-10-CM | POA: Diagnosis not present

## 2020-09-23 DIAGNOSIS — M545 Low back pain, unspecified: Secondary | ICD-10-CM | POA: Diagnosis not present

## 2020-09-23 DIAGNOSIS — M6289 Other specified disorders of muscle: Secondary | ICD-10-CM | POA: Diagnosis not present

## 2020-09-23 DIAGNOSIS — M5416 Radiculopathy, lumbar region: Secondary | ICD-10-CM | POA: Diagnosis not present

## 2020-09-26 DIAGNOSIS — M533 Sacrococcygeal disorders, not elsewhere classified: Secondary | ICD-10-CM | POA: Diagnosis not present

## 2020-10-04 ENCOUNTER — Ambulatory Visit: Payer: Medicare HMO | Attending: Internal Medicine | Admitting: Internal Medicine

## 2020-10-04 ENCOUNTER — Encounter: Payer: Self-pay | Admitting: Internal Medicine

## 2020-10-04 ENCOUNTER — Other Ambulatory Visit: Payer: Self-pay

## 2020-10-04 VITALS — BP 124/84 | HR 98 | Temp 97.4°F | Resp 16 | Wt 156.8 lb

## 2020-10-04 DIAGNOSIS — R682 Dry mouth, unspecified: Secondary | ICD-10-CM

## 2020-10-04 DIAGNOSIS — N951 Menopausal and female climacteric states: Secondary | ICD-10-CM | POA: Diagnosis not present

## 2020-10-04 DIAGNOSIS — E782 Mixed hyperlipidemia: Secondary | ICD-10-CM | POA: Diagnosis not present

## 2020-10-04 DIAGNOSIS — I10 Essential (primary) hypertension: Secondary | ICD-10-CM | POA: Diagnosis not present

## 2020-10-04 DIAGNOSIS — M7062 Trochanteric bursitis, left hip: Secondary | ICD-10-CM

## 2020-10-04 DIAGNOSIS — E663 Overweight: Secondary | ICD-10-CM | POA: Diagnosis not present

## 2020-10-04 MED ORDER — KETOROLAC TROMETHAMINE 60 MG/2ML IM SOLN
60.0000 mg | Freq: Once | INTRAMUSCULAR | Status: AC
  Administered 2020-10-04: 60 mg via INTRAMUSCULAR

## 2020-10-04 NOTE — Patient Instructions (Addendum)
Try Biotene Mouth wash for dry mouth.  This can be purchased over the counter.    Healthy Eating Following a healthy eating pattern may help you to achieve and maintain a healthy body weight, reduce the risk of chronic disease, and live a long and productive life. It is important to follow a healthy eating pattern at an appropriate calorie level for your body. Your nutritional needs should be met primarily through food by choosing a variety of nutrient-rich foods. What are tips for following this plan? Reading food labels  Read labels and choose the following: ? Reduced or low sodium. ? Juices with 100% fruit juice. ? Foods with low saturated fats and high polyunsaturated and monounsaturated fats. ? Foods with whole grains, such as whole wheat, cracked wheat, brown rice, and wild rice. ? Whole grains that are fortified with folic acid. This is recommended for women who are pregnant or who want to become pregnant.  Read labels and avoid the following: ? Foods with a lot of added sugars. These include foods that contain brown sugar, corn sweetener, corn syrup, dextrose, fructose, glucose, high-fructose corn syrup, honey, invert sugar, lactose, malt syrup, maltose, molasses, raw sugar, sucrose, trehalose, or turbinado sugar.  Do not eat more than the following amounts of added sugar per day:  6 teaspoons (25 g) for women.  9 teaspoons (38 g) for men. ? Foods that contain processed or refined starches and grains. ? Refined grain products, such as white flour, degermed cornmeal, white bread, and white rice. Shopping  Choose nutrient-rich snacks, such as vegetables, whole fruits, and nuts. Avoid high-calorie and high-sugar snacks, such as potato chips, fruit snacks, and candy.  Use oil-based dressings and spreads on foods instead of solid fats such as butter, stick margarine, or cream cheese.  Limit pre-made sauces, mixes, and "instant" products such as flavored rice, instant noodles, and  ready-made pasta.  Try more plant-protein sources, such as tofu, tempeh, black beans, edamame, lentils, nuts, and seeds.  Explore eating plans such as the Mediterranean diet or vegetarian diet. Cooking  Use oil to saut or stir-fry foods instead of solid fats such as butter, stick margarine, or lard.  Try baking, boiling, grilling, or broiling instead of frying.  Remove the fatty part of meats before cooking.  Steam vegetables in water or broth. Meal planning   At meals, imagine dividing your plate into fourths: ? One-half of your plate is fruits and vegetables. ? One-fourth of your plate is whole grains. ? One-fourth of your plate is protein, especially lean meats, poultry, eggs, tofu, beans, or nuts.  Include low-fat dairy as part of your daily diet. Lifestyle  Choose healthy options in all settings, including home, work, school, restaurants, or stores.  Prepare your food safely: ? Wash your hands after handling raw meats. ? Keep food preparation surfaces clean by regularly washing with hot, soapy water. ? Keep raw meats separate from ready-to-eat foods, such as fruits and vegetables. ? Cook seafood, meat, poultry, and eggs to the recommended internal temperature. ? Store foods at safe temperatures. In general:  Keep cold foods at 64F (4.4C) or below.  Keep hot foods at 164F (60C) or above.  Keep your freezer at [facility] (-17.8C) or below.  Foods are no longer safe to eat when they have been between the temperatures of 40-164F (4.4-60C) for more than 2 hours. What foods should I eat? Fruits Aim to eat 2 cup-equivalents of fresh, canned (in natural juice), or frozen fruits each day. Examples  of 1 cup-equivalent of fruit include 1 small apple, 8 large strawberries, 1 cup canned fruit,  cup dried fruit, or 1 cup 100% juice. Vegetables Aim to eat 2-3 cup-equivalents of fresh and frozen vegetables each day, including different varieties and colors. Examples of 1  cup-equivalent of vegetables include 2 medium carrots, 2 cups raw, leafy greens, 1 cup chopped vegetable (raw or cooked), or 1 medium baked potato. Grains Aim to eat 6 ounce-equivalents of whole grains each day. Examples of 1 ounce-equivalent of grains include 1 slice of bread, 1 cup ready-to-eat cereal, 3 cups popcorn, or  cup cooked rice, pasta, or cereal. Meats and other proteins Aim to eat 5-6 ounce-equivalents of protein each day. Examples of 1 ounce-equivalent of protein include 1 egg, 1/2 cup nuts or seeds, or 1 tablespoon (16 g) peanut butter. A cut of meat or fish that is the size of a deck of cards is about 3-4 ounce-equivalents.  Of the protein you eat each week, try to have at least 8 ounces come from seafood. This includes salmon, trout, herring, and anchovies. Dairy Aim to eat 3 cup-equivalents of fat-free or low-fat dairy each day. Examples of 1 cup-equivalent of dairy include 1 cup (240 mL) milk, 8 ounces (250 g) yogurt, 1 ounces (44 g) natural cheese, or 1 cup (240 mL) fortified soy milk. Fats and oils  Aim for about 5 teaspoons (21 g) per day. Choose monounsaturated fats, such as canola and olive oils, avocados, peanut butter, and most nuts, or polyunsaturated fats, such as sunflower, corn, and soybean oils, walnuts, pine nuts, sesame seeds, sunflower seeds, and flaxseed. Beverages  Aim for six 8-oz glasses of water per day. Limit coffee to three to five 8-oz cups per day.  Limit caffeinated beverages that have added calories, such as soda and energy drinks.  Limit alcohol intake to no more than 1 drink a day for nonpregnant women and 2 drinks a day for men. One drink equals 12 oz of beer (355 mL), 5 oz of wine (148 mL), or 1 oz of hard liquor (44 mL). Seasoning and other foods  Avoid adding excess amounts of salt to your foods. Try flavoring foods with herbs and spices instead of salt.  Avoid adding sugar to foods.  Try using oil-based dressings, sauces, and spreads  instead of solid fats. This information is based on general [place] nutrition guidelines. For more information, visit [employer].es. Exact amounts may vary based on your nutrition needs. Summary  A healthy eating plan may help you to maintain a healthy weight, reduce the risk of chronic diseases, and stay active throughout your life.  Plan your meals. Make sure you eat the right portions of a variety of nutrient-rich foods.  Try baking, boiling, grilling, or broiling instead of frying.  Choose healthy options in all settings, including home, work, school, restaurants, or stores. This information is not intended to replace advice given to you by your health care provider. Make sure you discuss any questions you have with your health care provider. Document Revised: 02/03/2018 Document Reviewed: 02/03/2018 Elsevier Patient Education  [place].

## 2020-10-04 NOTE — Progress Notes (Signed)
Patient ID: [name], female    DOB: 1966/05/06  MRN: [id number]  CC: Hypertension   Subjective: [name] is a 54 y.o. female who presents for chronic ds management Her concerns today include:  Pt with hx of HTN, chronic constipation,anal fistulas,vertigo, HL, chronic back pain (followed by pain specialist)and GERD  HL: tolerating Crestor.  Some dry mouth for a while, no dry eyes.  Drinks adequate fluid during the day.  Hot flashes: Much improved on gabapentin.  She has seen her gynecologist since last visit with me.  States that the gynecologist also approved of her using the gabapentin.  HTN: Reports compliance with medications amlodipine and spironolactone.  No device to check blood pressure on but states it has been good when checked at other physician offices.    C/o pain LT side of hip, woke up with it this a.m.  Points to trochanteric area.  No initiating factors. No numbness or shooting pains.  Has not taken anything this a.m as yet for the pain  BMI 27.  "I over did it for thanksgiving."  Does get her walking in for exercise.  Patient Active Problem List   Diagnosis Date Noted   Altered consciousness 12/10/2018   Carpal tunnel syndrome of right wrist 06/12/2018   Postnasal drip 06/12/2018   Vertigo 10/18/2016   Right arm pain 04/19/2014   Disturbance of skin sensation 04/19/2014   Anal fistula 04/14/2013   UNSPECIFIED SITE OF SPRAIN AND STRAIN 07/07/2009   PEDAL EDEMA 06/29/2009   MUSCLE PAIN 06/23/2009   ANEMIA-IRON DEFICIENCY 03/11/2009   Allergic rhinitis 03/11/2009   SYNCOPE, VASOVAGAL 03/11/2009   HEADACHE 03/11/2009     Current Outpatient Medications on File Prior to Visit  Medication Sig Dispense Refill   albuterol (VENTOLIN HFA) 108 (90 Base) MCG/ACT inhaler Inhale 2 puffs into the lungs every 6 (six) hours as needed for wheezing or shortness of breath. 6.7 g 1   amLODipine (NORVASC) 10 MG tablet TAKE 1 TABLET(10 MG) BY MOUTH  DAILY 30 tablet 6   benzonatate (TESSALON PERLES) 100 MG capsule Take 1 capsule (100 mg total) by mouth 2 (two) times daily as needed for cough. (Patient not taking: Reported on 10/04/2020) 30 capsule 1   Blood Pressure Monitor DEVI Use as directed to check home blood pressure 2-3 times a week 1 Device 0   diclofenac sodium (VOLTAREN) 1 % GEL Apply 2 g topically 4 (four) times daily. 100 g 0   diphenhydrAMINE (BENADRYL) 25 MG tablet Take 1 tablet (25 mg total) by mouth every 6 (six) hours as needed. 20 tablet 0   DULoxetine (CYMBALTA) 30 MG capsule Take 1 capsule by mouth at bedtime.     EPINEPHrine (EPIPEN 2-PAK) 0.3 mg/0.3 mL IJ SOAJ injection Inject 0.3 mLs (0.3 mg total) into the muscle as needed for anaphylaxis. 1 each 0   fluticasone (FLONASE) 50 MCG/ACT nasal spray Place 1 spray into both nostrils daily. 16 g 2   gabapentin (NEURONTIN) 300 MG capsule Take 1 capsule (300 mg total) by mouth at bedtime. 30 capsule 3   linaclotide (LINZESS) 290 MCG CAPS capsule Take 1 capsule (290 mcg total) by mouth daily before breakfast. 30 capsule 2   loratadine (CLARITIN) 10 MG tablet Take 1 tablet (10 mg total) by mouth daily. 30 tablet 6   omeprazole (PRILOSEC) 20 MG capsule TAKE 1 CAPSULE(20 MG) BY MOUTH TWICE DAILY BEFORE A MEAL 180 capsule 3   oxycodone (OXY-IR) 5 MG capsule Take 5  mg by mouth every 8 (eight) hours as needed.     rosuvastatin (CRESTOR) 10 MG tablet Take 1 tablet (10 mg total) by mouth daily. Stop Simvastatin 30 tablet 3   spironolactone (ALDACTONE) 25 MG tablet TAKE 1/2 TABLET(12.5 MG) BY MOUTH DAILY (Patient not taking: Reported on 06/27/2020) 30 tablet 6   No current facility-administered medications on file prior to visit.    Allergies  Allergen Reactions   Ampicillin Anaphylaxis   Atorvastatin Calcium Other (See Comments)    Pt reported lip swelling   Penicillins Anaphylaxis   Sulfonamide Derivatives Anaphylaxis   Hydrocodone-Acetaminophen Hives    Have  to take a Benadryl to take med   Tramadol Itching   Tylenol [Acetaminophen] Itching    Social History   Socioeconomic History   Marital status: Single    Spouse name: Not on file   Number of children: 2   Years of education: 10   Highest education level: Not on file  Occupational History   Occupation: N/A  Tobacco Use   Smoking status: Never Smoker   Smokeless tobacco: Never Used  Substance and Sexual Activity   Alcohol use: Yes    Alcohol/week: 0.0 standard drinks    Comment: occa   Drug use: No   Sexual activity: Yes    Birth control/protection: Pill  Other Topics Concern   Not on file  Social History Narrative   Lives at home w/ her mother   Right-handed   Caffeine: 1 cup of tea per day   Social Determinants of Health   Financial Resource Strain:    Difficulty of Paying Living Expenses: Not on file  Food Insecurity:    Worried About Programme researcher, broadcasting/film/video in the Last Year: Not on file   [employer] of Food in the Last Year: Not on file  Transportation Needs:    Lack of Transportation (Medical): Not on file   Lack of Transportation (Non-Medical): Not on file  Physical Activity:    Days of Exercise per Week: Not on file   Minutes of Exercise per Session: Not on file  Stress:    Feeling of Stress : Not on file  Social Connections:    Frequency of Communication with Friends and Family: Not on file   Frequency of Social Gatherings with Friends and Family: Not on file   Attends Religious Services: Not on file   Active Member of Clubs or Organizations: Not on file   Attends Banker Meetings: Not on file   Marital Status: Not on file  Intimate Partner Violence:    Fear of Current or Ex-Partner: Not on file   Emotionally Abused: Not on file   Physically Abused: Not on file   Sexually Abused: Not on file    Family History  Problem Relation Age of Onset   Diabetes Mother    Heart attack Father    Diabetes Brother     Diabetes Maternal Uncle    Diabetes Maternal Aunt    Seizures Sister    Colon cancer Neg Hx    Stomach cancer Neg Hx     Past Surgical History:  Procedure Laterality Date   ANAL FISTULECTOMY  08/22/2012   Procedure: FISTULECTOMY ANAL;  Surgeon: Lodema Pilot, DO;  Location: [facility];  Service: General;  Laterality: N/A;  rectal examination under anesthesia with seton placement    ANAL FISTULECTOMY  09/17/2012   Procedure: FISTULECTOMY ANAL;  Surgeon: Lodema Pilot, DO;  Location:  SURGERY CENTER;  Service: General;  Laterality: N/A;  possible fistulotomy   D & C HYSTEROSCOPY/ NOVASURE ENDOMETRIAL ABLATION/ I & D THROMBOSED HEMORROID  01-10-2007  DR Isaias Cowman ROSS   EXAMINATION UNDER ANESTHESIA  09/17/2012   Procedure: EXAM UNDER ANESTHESIA;  Surgeon: Lodema Pilot, DO;  Location: [facility];  Service: General;  Laterality: N/A;  Rectal exam under anesthesia   EXAMINATION UNDER ANESTHESIA N/A 05/14/2013   Procedure: EXAM UNDER ANESTHESIA;  Surgeon: Romie Levee, MD;  Location: [facility];  Service: General;  Laterality: N/A;   FISTULA PLUG  09/17/2012   Procedure: FISTULA PLUG;  Surgeon: Lodema Pilot, DO;  Location: [facility];  Service: General;  Laterality: N/A;  Possible fistula plug   HEMORRHOID SURGERY  04-28-2009   [facility] INTERNAL HEMORRHOIDS   IRRIGATION AND DEBRIDEMENT ABSCESS  07/08/2012   Procedure: MINOR INCISION AND DRAINAGE OF ABSCESS;  Surgeon: Geryl Rankins, MD;  Location: [facility] [place];  Service: Gynecology;  Laterality: N/A;  Vulvar Abscess   PLACEMENT OF SETON N/A 05/14/2013   Procedure:  PLACEMENT OF SETON;  Surgeon: Romie Levee, MD;  Location: [facility];  Service: General;  Laterality: N/A;   RECTAL EXAM UNDER ANESTHESIA N/A 08/20/2013   Procedure: mucosal advancement flap ;  Surgeon: Romie Levee, MD;  Location: [facility];  Service: General;  Laterality: N/A;  parks anal retractor long  rectal instrucments prone jack knife anal fistula    RECTAL ULTRASOUND N/A 07/20/2013   Procedure: RECTAL ULTRASOUND;  Surgeon: Romie Levee, MD;  Location: [facility];  Service: Endoscopy;  Laterality: N/A;    ROS: Review of Systems Negative except as stated above  PHYSICAL EXAM: BP 124/84    Pulse 98    Temp (!) 97.4 F (36.3 C)    Resp 16    Wt 156 lb 12.8 oz (71.1 kg)    SpO2 98%    BMI 26.91 kg/m   Wt Readings from Last 3 Encounters:  10/04/20 156 lb 12.8 oz (71.1 kg)  08/22/20 154 lb (69.9 kg)  06/27/20 145 lb (65.8 kg)    Physical Exam  General appearance - alert, well appearing, and in no distress Mental status - normal mood, behavior, speech, dress, motor activity, and thought processes Nose - normal and patent, no erythema, discharge or polyps Mouth - mucous membranes moist, pharynx normal without lesions Neck - supple, no significant adenopathy Chest - clear to auscultation, no wheezes, rales or rhonchi, symmetric air entry Heart - normal rate, regular rhythm, normal S1, S2, no murmurs, rubs, clicks or gallops Musculoskeletal -left hip: Mild to moderate tenderness on palpation over the left trochanteric bursa.  Mild to moderate discomfort with passive range of motion of the hip especially with internal and external rotation.   Extremities - peripheral pulses normal, no pedal edema, no clubbing or cyanosis   CMP Latest Ref Rng & Units 01/28/2020 04/01/2019 03/24/2019  Glucose 65 - 99 mg/dL 91 93 83  BUN 6 - 24 mg/dL 7 7 11   Creatinine 0.57 - 1.00 mg/dL 9.56 3.87  Sodium 134 - 144 mmol/L 144 138 140  Potassium 3.5 - 5.2 mmol/L 4.1 4.2 4.6  Chloride 96 - 106 mmol/L 103 104 100  CO2 20 - 29 mmol/L 27 25 26   Calcium 8.7 - 10.2 mg/dL 9.7 9.2 9.5  Total Protein 6.0 - 8.5 g/dL 7.2 - -  Total Bilirubin 0.0 - 1.2 mg/dL 5.64 - -  Alkaline Phos 39 - 117 IU/L 83 - -  AST 0 - 40 IU/L 17 - -  ALT 0 - 32 IU/L 8 - -   Lipid Panel     Component Value Date/Time   CHOL  199 01/28/2020 1625   TRIG 120 01/28/2020 1625   HDL 60 01/28/2020 1625   CHOLHDL 3.3 01/28/2020 1625   CHOLHDL 3.8 03/19/2009 0414   VLDL 13 03/19/2009 0414   LDLCALC 118 (H) 01/28/2020 1625    CBC    Component Value Date/Time   WBC 4.9 01/28/2020 1625   WBC 5.5 04/01/2019 1054   RBC 4.27 01/28/2020 1625   RBC 4.34 04/01/2019 1054   HGB 13.0 01/28/2020 1625   HGB 13.8 09/05/2009 1347   HCT 38.2 01/28/2020 1625   HCT 41.3 09/05/2009 1347   PLT 288 01/28/2020 1625   MCV 90 01/28/2020 1625   MCV 92.0 09/05/2009 1347   MCH 30.4 01/28/2020 1625   MCH 29.5 04/01/2019 1054   MCHC 34.0 01/28/2020 1625   MCHC 32.3 04/01/2019 1054   RDW 12.8 01/28/2020 1625   RDW 13.0 09/05/2009 1347   LYMPHSABS 2.2 04/01/2019 1054   LYMPHSABS 2.9 03/16/2019 1427   LYMPHSABS 2.1 09/05/2009 1347   MONOABS 0.4 04/01/2019 1054   MONOABS 0.3 09/05/2009 1347   EOSABS 0.1 04/01/2019 1054   EOSABS 0.1 03/16/2019 1427   BASOSABS 0.1 04/01/2019 1054   BASOSABS 0.1 03/16/2019 1427   BASOSABS 0.1 09/05/2009 1347    ASSESSMENT AND PLAN:  1. Essential hypertension Close to goal.  Continue current medications and low-salt diet.  2. Mixed hyperlipidemia Continue Crestor.  She will be due for blood test on her next visit  3. Dry mouth In looking at her med list I see that Cymbalta can cause dry mouth.  She will try using Biotene mouthwash over-the-counter.  If no improvement with this she will let me know.  4. Hot flashes due to menopause Doing well on gabapentin  5. Overweight Discussed healthy eating habits and printed information given.  6. Trochanteric bursitis of left hip Patient agreeable to having Toradol shot today.  After which I have told her to use Naprosyn or ibuprofen twice a day for the next 2 days.  If no improvement she will let me know. - ketorolac (TORADOL) injection 60 mg  Patient was given the opportunity to ask questions.  Patient verbalized understanding of the plan and was  able to repeat key elements of the plan.   No orders of the defined types were placed in this encounter.    Requested Prescriptions    No prescriptions requested or ordered in this encounter    Return in about 4 months (around 02/01/2021).  Jonah Blue, MD, FACP

## 2020-10-10 DIAGNOSIS — M5416 Radiculopathy, lumbar region: Secondary | ICD-10-CM | POA: Diagnosis not present

## 2020-10-25 DIAGNOSIS — M5416 Radiculopathy, lumbar region: Secondary | ICD-10-CM | POA: Diagnosis not present

## 2020-10-25 DIAGNOSIS — Z79891 Long term (current) use of opiate analgesic: Secondary | ICD-10-CM | POA: Diagnosis not present

## 2020-11-29 ENCOUNTER — Telehealth: Payer: Self-pay | Admitting: Internal Medicine

## 2020-11-29 MED ORDER — MECLIZINE HCL 25 MG PO TABS: 25.0000 mg | ORAL_TABLET | Freq: Two times a day (BID) | ORAL | 0 refills | Status: DC | PRN

## 2020-11-29 NOTE — Telephone Encounter (Signed)
Patient called to ask if the doctor or nurse could call her or send in some medication for her vertigo  She said she hasn't taken it in a while but feels that it is flaring up again.  Please advise and call patient to discuss at [phone]

## 2020-11-29 NOTE — Telephone Encounter (Signed)
Attempt to call patient several times at number listed but number is invalid.   Correct contact number is [phone]  Feels dizzy, when lying and standing Ongoing now for 2 days Denies falls at home or difficulty walking. Denies chest pain or SOB.  Informed patient that  meclizine (ANTIVERT) tablet 25 mg was not rx'ed by Dr. Laural Benes in the past. Would route to see if she will send to pharmacy.

## 2020-11-30 NOTE — Telephone Encounter (Signed)
Patient is aware that medication is sent to the pharmacy. Patient is very grateful.

## 2020-12-05 DIAGNOSIS — M5416 Radiculopathy, lumbar region: Secondary | ICD-10-CM | POA: Diagnosis not present

## 2020-12-12 ENCOUNTER — Other Ambulatory Visit: Payer: Self-pay

## 2020-12-12 ENCOUNTER — Encounter: Payer: Self-pay | Admitting: Internal Medicine

## 2020-12-12 ENCOUNTER — Ambulatory Visit: Payer: Medicare Other | Attending: Internal Medicine | Admitting: Internal Medicine

## 2020-12-12 VITALS — BP 128/70 | HR 91 | Temp 98.2°F | Resp 16 | Wt 161.4 lb

## 2020-12-12 DIAGNOSIS — L309 Dermatitis, unspecified: Secondary | ICD-10-CM | POA: Diagnosis not present

## 2020-12-12 MED ORDER — PREDNISONE 20 MG PO TABS: ORAL_TABLET | ORAL | 0 refills | Status: DC

## 2020-12-12 NOTE — Progress Notes (Signed)
Patient ID: [name], female    DOB: 07-10-1966  MRN: [id number]  CC: No chief complaint on file.   Subjective: [name] is a 55 y.o. female who presents for UC visitt Her concerns today include:  Pt with hx of HTN, chronic constipation,anal fistulas,vertigo, HL, chronic back pain (followed by pain specialist)and GERD  Patient complains of waking up with itching and a rash yesterday morning.  Initially started on her forearms and then spread to other areas of the body including the legs, face and thorax.  No known initiating factors.  She does not have bedbugs.  No one else in the house has this problem.  Denies any changes in body products.  No new plants or animals in the house.  No known food allergies.  She tried taking some Benadryl without relief.  Patient Active Problem List   Diagnosis Date Noted  . Altered consciousness 12/10/2018  . Carpal tunnel syndrome of right wrist 06/12/2018  . Postnasal drip 06/12/2018  . Vertigo 10/18/2016  . Disturbance of skin sensation 04/19/2014  . Anal fistula 04/14/2013  . UNSPECIFIED SITE OF SPRAIN AND STRAIN 07/07/2009  . PEDAL EDEMA 06/29/2009  . MUSCLE PAIN 06/23/2009  . 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 Allergic rhinitis 03/11/2009  . SYNCOPE, VASOVAGAL 03/11/2009  . HEADACHE 03/11/2009     Current Outpatient Medications on File Prior to Visit  Medication Sig Dispense Refill  . albuterol (VENTOLIN HFA) 108 (90 Base) MCG/ACT inhaler Inhale 2 puffs into the lungs every 6 (six) hours as needed for wheezing or shortness of breath. 6.7 g 1  . amLODipine (NORVASC) 10 MG tablet TAKE 1 TABLET(10 MG) BY MOUTH DAILY 30 tablet 6  . benzonatate (TESSALON PERLES) 100 MG capsule Take 1 capsule (100 mg total) by mouth 2 (two) times daily as needed for cough. (Patient not taking: Reported on 10/04/2020) 30 capsule 1  . Blood Pressure Monitor DEVI Use as directed to check home blood pressure 2-3 times a week 1 Device 0  . diclofenac  sodium (VOLTAREN) 1 % GEL Apply 2 g topically 4 (four) times daily. 100 g 0  . diphenhydrAMINE (BENADRYL) 25 MG tablet Take 1 tablet (25 mg total) by mouth every 6 (six) hours as needed. (Patient not taking: Reported on 12/12/2020) 20 tablet 0  . DULoxetine (CYMBALTA) 30 MG capsule Take 1 capsule by mouth at bedtime.    Marland Kitchen EPINEPHrine (EPIPEN 2-PAK) 0.3 mg/0.3 mL IJ SOAJ injection Inject 0.3 mLs (0.3 mg total) into the muscle as needed for anaphylaxis. 1 each 0  . fluticasone (FLONASE) 50 MCG/ACT nasal spray Place 1 spray into both nostrils daily. 16 g 2  . gabapentin (NEURONTIN) 300 MG capsule Take 1 capsule (300 mg total) by mouth at bedtime. 30 capsule 3  . linaclotide (LINZESS) 290 MCG CAPS capsule Take 1 capsule (290 mcg total) by mouth daily before breakfast. 30 capsule 2  . loratadine (CLARITIN) 10 MG tablet Take 1 tablet (10 mg total) by mouth daily. 30 tablet 6  . meclizine (ANTIVERT) 25 MG tablet Take 1 tablet (25 mg total) by mouth 2 (two) times daily as needed for dizziness. 20 tablet 0  . omeprazole (PRILOSEC) 20 MG capsule TAKE 1 CAPSULE(20 MG) BY MOUTH TWICE DAILY BEFORE A MEAL 180 capsule 3  . oxycodone (OXY-IR) 5 MG capsule Take 5 mg by mouth every 8 (eight) hours as needed.    . rosuvastatin (CRESTOR) 10 MG tablet Take 1 tablet (10 mg total) by mouth  daily. Stop Simvastatin 30 tablet 3  . spironolactone (ALDACTONE) 25 MG tablet TAKE 1/2 TABLET(12.5 MG) BY MOUTH DAILY (Patient not taking: Reported on 06/27/2020) 30 tablet 6   No current facility-administered medications on file prior to visit.    Allergies  Allergen Reactions  . Ampicillin Anaphylaxis  . Atorvastatin Calcium Other (See Comments)    Pt reported lip swelling  . Penicillins Anaphylaxis  . Sulfonamide Derivatives Anaphylaxis  . Hydrocodone-Acetaminophen Hives    Have to take a Benadryl to take med  . Tramadol Itching  . Tylenol [Acetaminophen] Itching    Social History   Socioeconomic History  . Marital  status: Single    Spouse name: Not on file  . Number of children: 2  . Years of education: 10  . Highest education level: Not on file  Occupational History  . Occupation: N/A  Tobacco Use  . Smoking status: Never Smoker  . Smokeless tobacco: Never Used  Substance and Sexual Activity  . Alcohol use: Yes    Alcohol/week: 0.0 standard drinks    Comment: occa  . Drug use: No  . Sexual activity: Yes    Birth control/protection: Pill  Other Topics Concern  . Not on file  Social History Narrative   Lives at home w/ her mother   Right-handed   Caffeine: 1 cup of tea per day   Social Determinants of Health   Financial Resource Strain: Not on file  Food Insecurity: Not on file  Transportation Needs: Not on file  Physical Activity: Not on file  Stress: Not on file  Social Connections: Not on file  Intimate Partner Violence: Not on file    Family History  Problem Relation Age of Onset  . Diabetes Mother   . Heart attack Father   . Diabetes Brother   . Diabetes Maternal Uncle   . Diabetes Maternal Aunt   . Seizures Sister   . Colon cancer Neg Hx   . Stomach cancer Neg Hx     Past Surgical History:  Procedure Laterality Date  . ANAL FISTULECTOMY  08/22/2012   Procedure: FISTULECTOMY ANAL;  Surgeon: Lodema Pilot, DO;  Location: [facility];  Service: General;  Laterality: N/A;  rectal examination under anesthesia with seton placement   . ANAL FISTULECTOMY  09/17/2012   Procedure: FISTULECTOMY ANAL;  Surgeon: Lodema Pilot, DO;  Location: [facility];  Service: General;  Laterality: N/A;  possible fistulotomy  . D & C HYSTEROSCOPY/ NOVASURE ENDOMETRIAL ABLATION/ I & D THROMBOSED HEMORROID  01-10-2007  DR Miguel Aschoff  . EXAMINATION UNDER ANESTHESIA  09/17/2012   Procedure: EXAM UNDER ANESTHESIA;  Surgeon: Lodema Pilot, DO;  Location: [facility];  Service: General;  Laterality: N/A;  Rectal exam under anesthesia  . EXAMINATION UNDER ANESTHESIA N/A 05/14/2013    Procedure: EXAM UNDER ANESTHESIA;  Surgeon: Romie Levee, MD;  Location: [facility];  Service: General;  Laterality: N/A;  . FISTULA PLUG  09/17/2012   Procedure: FISTULA PLUG;  Surgeon: Lodema Pilot, DO;  Location: [facility];  Service: General;  Laterality: N/A;  Possible fistula plug  . HEMORRHOID SURGERY  04-28-2009   [facility] INTERNAL HEMORRHOIDS  . IRRIGATION AND DEBRIDEMENT ABSCESS  07/08/2012   Procedure: MINOR INCISION AND DRAINAGE OF ABSCESS;  Surgeon: Geryl Rankins, MD;  Location: [facility] ;  Service: Gynecology;  Laterality: N/A;  Vulvar Abscess  . PLACEMENT OF SETON N/A 05/14/2013   Procedure:  PLACEMENT OF SETON;  Surgeon:  Romie Levee, MD;  Location: [facility];  Service: General;  Laterality: N/A;  . RECTAL EXAM UNDER ANESTHESIA N/A 08/20/2013   Procedure: mucosal advancement flap ;  Surgeon: Romie Levee, MD;  Location: [facility];  Service: General;  Laterality: N/A;  parks anal retractor long rectal instrucments prone jack knife anal fistula   . RECTAL ULTRASOUND N/A 07/20/2013   Procedure: RECTAL ULTRASOUND;  Surgeon: Romie Levee, MD;  Location: [facility];  Service: Endoscopy;  Laterality: N/A;    ROS: Review of Systems Negative except as stated above  PHYSICAL EXAM: BP 128/70   Pulse 91   Temp 98.2 F (36.8 C)   Resp 16   Wt 161 lb 6.4 oz (73.2 kg)   SpO2 96%   BMI 27.70 kg/m   Physical Exam  General appearance - alert, well appearing, and in no distress Mental status - normal mood, behavior, speech, dress, motor activity, and thought processes Skin -patient with hive-like crops noted on the lower back right side.  Smaller lesions noted on the forearm.  No lesions noted on the hands or between the webs of the fingers.   CMP Latest Ref Rng & Units 01/28/2020 04/01/2019 03/24/2019  Glucose 65 - 99 mg/dL 91 93 83  BUN 6 - 24 mg/dL 7 7 11   Creatinine 0.57 - 1.00 mg/dL 9.39 0.30 0.92  Sodium 134 - 144  mmol/L 144 138 140  Potassium 3.5 - 5.2 mmol/L 4.1 4.2 4.6  Chloride 96 - 106 mmol/L 103 104 100  CO2 20 - 29 mmol/L 27 25 26   Calcium 8.7 - 10.2 mg/dL 9.7 9.2 9.5  Total Protein 6.0 - 8.5 g/dL 7.2 - -  Total Bilirubin 0.0 - 1.2 mg/dL <3.3 - -  Alkaline Phos 39 - 117 IU/L 83 - -  AST 0 - 40 IU/L 17 - -  ALT 0 - 32 IU/L 8 - -   Lipid Panel     Component Value Date/Time   CHOL 199 01/28/2020 1625   TRIG 120 01/28/2020 1625   HDL 60 01/28/2020 1625   CHOLHDL 3.3 01/28/2020 1625   CHOLHDL 3.8 03/19/2009 0414   VLDL 13 03/19/2009 0414   LDLCALC 118 (H) 01/28/2020 1625    CBC    Component Value Date/Time   WBC 4.9 01/28/2020 1625   WBC 5.5 04/01/2019 1054   RBC 4.27 01/28/2020 1625   RBC 4.34 04/01/2019 1054   HGB 13.0 01/28/2020 1625   HGB 13.8 09/05/2009 1347   HCT 38.2 01/28/2020 1625   HCT 41.3 09/05/2009 1347   PLT 288 01/28/2020 1625   MCV 90 01/28/2020 1625   MCV 92.0 09/05/2009 1347   MCH 30.4 01/28/2020 1625   MCH 29.5 04/01/2019 1054   MCHC 34.0 01/28/2020 1625   MCHC 32.3 04/01/2019 1054   RDW 12.8 01/28/2020 1625   RDW 13.0 09/05/2009 1347   LYMPHSABS 2.2 04/01/2019 1054   LYMPHSABS 2.9 03/16/2019 1427   LYMPHSABS 2.1 09/05/2009 1347   MONOABS 0.4 04/01/2019 1054   MONOABS 0.3 09/05/2009 1347   EOSABS 0.1 04/01/2019 1054   EOSABS 0.1 03/16/2019 1427   BASOSABS 0.1 04/01/2019 1054   BASOSABS 0.1 03/16/2019 1427   BASOSABS 0.1 09/05/2009 1347    ASSESSMENT AND PLAN:  1. Dermatitis Etiology unclear.  Likely allergic reaction to something.  We will put on prednisone for about 5 days.  Advised to continue using Benadryl as needed.  Follow-up or call if no improvement. - predniSONE (DELTASONE) 20 MG  tablet; 1.5 tab PO daily x 2 days then 1 tab daily  Dispense: 6 tablet; Refill: 0   Patient was given the opportunity to ask questions.  Patient verbalized understanding of the plan and was able to repeat key elements of the plan.   No orders of the defined  types were placed in this encounter.    Requested Prescriptions   Signed Prescriptions Disp Refills  . predniSONE (DELTASONE) 20 MG tablet 6 tablet 0    Sig: 1.5 tab PO daily x 2 days then 1 tab daily    No follow-ups on file.  Jonah Blue, MD, FACP

## 2020-12-12 NOTE — Patient Instructions (Signed)
I have sent prescription to the pharmacy for prednisone for 5 days.  You can continue to use the Benadryl as needed to help with the itching while the prednisone takes effect.  Let me know if there is any recurrence.

## 2021-01-02 DIAGNOSIS — M5416 Radiculopathy, lumbar region: Secondary | ICD-10-CM | POA: Diagnosis not present

## 2021-01-02 DIAGNOSIS — M461 Sacroiliitis, not elsewhere classified: Secondary | ICD-10-CM | POA: Diagnosis not present

## 2021-01-02 DIAGNOSIS — M25552 Pain in left hip: Secondary | ICD-10-CM | POA: Diagnosis not present

## 2021-01-04 ENCOUNTER — Other Ambulatory Visit: Payer: Self-pay | Admitting: Internal Medicine

## 2021-01-04 DIAGNOSIS — E782 Mixed hyperlipidemia: Secondary | ICD-10-CM

## 2021-01-04 DIAGNOSIS — M25552 Pain in left hip: Secondary | ICD-10-CM | POA: Diagnosis not present

## 2021-01-04 NOTE — Telephone Encounter (Signed)
Requested Prescriptions  Pending Prescriptions Disp Refills  . rosuvastatin (CRESTOR) 10 MG tablet [Pharmacy Med Name: ROSUVASTATIN 10MG  TABLETS] 30 tablet 0    Sig: TAKE 1 TABLET(10 MG) BY MOUTH DAILY. STOP SIMVASTATIN     Cardiovascular:  Antilipid - Statins Failed - 01/04/2021  6:21 AM      Failed - LDL in normal range and within 360 days    LDL Chol Calc (NIH)  Date Value Ref Range Status  01/28/2020 118 (H) 0 - 99 mg/dL Final         Passed - Total Cholesterol in normal range and within 360 days    Cholesterol, Total  Date Value Ref Range Status  01/28/2020 199 100 - 199 mg/dL Final         Passed - HDL in normal range and within 360 days    HDL  Date Value Ref Range Status  01/28/2020 60 >39 mg/dL Final         Passed - Triglycerides in normal range and within 360 days    Triglycerides  Date Value Ref Range Status  01/28/2020 120 0 - 149 mg/dL Final         Passed - Patient is not pregnant      Passed - Valid encounter within last 12 months    Recent Outpatient Visits          3 weeks ago Dermatitis   [facility] 01/30/2020, MD   3 months ago Essential hypertension   [facility] Marcine Matar, MD   6 months ago Hot flashes due to menopause   [facility], MD   7 months ago Essential hypertension   [facility], MD   11 months ago Essential hypertension   [facility], MD      Future Appointments            In 4 weeks Marcine Matar, MD [facility]

## 2021-01-05 ENCOUNTER — Other Ambulatory Visit: Payer: Self-pay | Admitting: Internal Medicine

## 2021-01-05 DIAGNOSIS — I10 Essential (primary) hypertension: Secondary | ICD-10-CM

## 2021-01-06 ENCOUNTER — Other Ambulatory Visit: Payer: Self-pay | Admitting: Internal Medicine

## 2021-01-06 NOTE — Telephone Encounter (Signed)
Medication Refill - Medication:   benzonatate (TESSALON PERLES) 100 MG capsule   Has the patient contacted their pharmacy? Yes.  Contact PCP office.   Preferred Pharmacy (with phone number or street name):  [id number] - [place], [place] - [address] AT [facility] [address]  [address] [place] [address]  Phone: [phone] Fax: [phone]    Agent: Please be advised that RX refills may take up to 3 business days. We ask that you follow-up with your pharmacy.

## 2021-01-06 NOTE — Telephone Encounter (Signed)
Requested medication (s) are due for refill today - unsure  Requested medication (s) are on the active medication list -yes-although notes states patient reported not taking 10/04/20  Future visit scheduled -yes  Last refill: 09/20/20 #30 1 RF  Notes to clinic: Medication request sent for review - chronic use or acute RF  Requested Prescriptions  Pending Prescriptions Disp Refills   benzonatate (TESSALON PERLES) 100 MG capsule 30 capsule 1    Sig: Take 1 capsule (100 mg total) by mouth 2 (two) times daily as needed for cough.      Ear, Nose, and Throat:  Antitussives/Expectorants Passed - 01/06/2021  1:16 PM      Passed - Valid encounter within last 12 months    Recent Outpatient Visits           3 weeks ago Dermatitis   [facility] Marcine Matar, MD   3 months ago Essential hypertension   [facility] Marcine Matar, MD   6 months ago Hot flashes due to menopause   [facility] Marcine Matar, MD   7 months ago Essential hypertension   [facility] Marcine Matar, MD   11 months ago Essential hypertension   [facility] Marcine Matar, MD       Future Appointments             In 3 weeks Marcine Matar, MD [facility]                 Requested Prescriptions  Pending Prescriptions Disp Refills   benzonatate (TESSALON PERLES) 100 MG capsule 30 capsule 1    Sig: Take 1 capsule (100 mg total) by mouth 2 (two) times daily as needed for cough.      Ear, Nose, and Throat:  Antitussives/Expectorants Passed - 01/06/2021  1:16 PM      Passed - Valid encounter within last 12 months    Recent Outpatient Visits           3 weeks ago Dermatitis   [facility] Marcine Matar, MD   3 months ago Essential hypertension   [facility] Marcine Matar, MD   6 months ago Hot flashes due to menopause   [facility] Marcine Matar, MD   7 months ago Essential hypertension   [facility] Marcine Matar, MD   11 months ago Essential hypertension   [facility] Marcine Matar, MD       Future Appointments             In 3 weeks Marcine Matar, MD [facility]

## 2021-01-09 MED ORDER — BENZONATATE 100 MG PO CAPS: 100.0000 mg | ORAL_CAPSULE | Freq: Two times a day (BID) | ORAL | 1 refills | Status: DC | PRN

## 2021-02-02 ENCOUNTER — Other Ambulatory Visit: Payer: Self-pay

## 2021-02-02 ENCOUNTER — Ambulatory Visit: Payer: Medicare Other | Attending: Internal Medicine | Admitting: Internal Medicine

## 2021-02-02 ENCOUNTER — Other Ambulatory Visit: Payer: Self-pay | Admitting: Internal Medicine

## 2021-02-02 DIAGNOSIS — I10 Essential (primary) hypertension: Secondary | ICD-10-CM

## 2021-02-02 DIAGNOSIS — E782 Mixed hyperlipidemia: Secondary | ICD-10-CM

## 2021-02-02 DIAGNOSIS — Z1231 Encounter for screening mammogram for malignant neoplasm of breast: Secondary | ICD-10-CM

## 2021-02-02 DIAGNOSIS — R0982 Postnasal drip: Secondary | ICD-10-CM | POA: Diagnosis not present

## 2021-02-02 DIAGNOSIS — R739 Hyperglycemia, unspecified: Secondary | ICD-10-CM

## 2021-02-02 DIAGNOSIS — K5909 Other constipation: Secondary | ICD-10-CM | POA: Diagnosis not present

## 2021-02-02 MED ORDER — BENZONATATE 100 MG PO CAPS
100.0000 mg | ORAL_CAPSULE | Freq: Two times a day (BID) | ORAL | 1 refills | Status: DC | PRN
Start: 2021-02-02 — End: 2021-05-15

## 2021-02-02 MED ORDER — SPIRONOLACTONE 25 MG PO TABS
ORAL_TABLET | ORAL | 3 refills | Status: DC
Start: 2021-02-02 — End: 2021-12-16

## 2021-02-02 MED ORDER — LINACLOTIDE 290 MCG PO CAPS: 290.0000 ug | ORAL_CAPSULE | Freq: Every day | ORAL | 2 refills | Status: DC

## 2021-02-02 MED ORDER — LORATADINE 10 MG PO TABS: 10.0000 mg | ORAL_TABLET | Freq: Every day | ORAL | 6 refills | Status: DC

## 2021-02-02 MED ORDER — FLUTICASONE PROPIONATE 50 MCG/ACT NA SUSP: 1.0000 | Freq: Every day | NASAL | 2 refills | Status: DC

## 2021-02-02 MED ORDER — ROSUVASTATIN CALCIUM 10 MG PO TABS: 10.0000 mg | ORAL_TABLET | Freq: Every day | ORAL | 6 refills | Status: DC

## 2021-02-02 NOTE — Progress Notes (Addendum)
Virtual Visit via Telephone Note  I connected with [name] on 02/02/21 at  1:34 p.m by telephone and verified that I am speaking with the correct person using two identifiers.  Location: Patient: home Provider: office  Participants: Myself Patient CMA: Ms. Julius Bowels   I discussed the limitations, risks, security and privacy concerns of performing an evaluation and management service by telephone and the availability of in person appointments. I also discussed with the patient that there may be a patient responsible charge related to this service. The patient expressed understanding and agreed to proceed.  History of Present Illness: Pt with hx of HTN, chronic constipation,anal fistulas,vertigo, HL, chronic back pain (followed by pain specialist), hot flashes (on gabapentin)and GERD  HL:  Needing RF on Crestor.  Taking and tolerating it okay.  Constipation:  Needs RF on Linzess.  Works well in keeping her bowel movements regular.  HYPERTENSION Currently taking: see medication list.  On Norvasc and Spironolactone  Med Adherence: [x]  Yes    []  No Medication side effects: []  Yes    [x]  No Adherence with salt restriction: []  Yes    []  No Home Monitoring?: []  Yes    [x]  No Monitoring Frequency: []  Yes    []  No Home BP results range: []  Yes    []  No SOB? []  Yes    []  No Chest Pain?: []  Yes    [x]  No Leg swelling?: []  Yes    [x]  No Headaches?: []  Yes    [x]  No Dizziness? []  Yes    [x]  No Comments:   Cough/Post-nasal drip:  Needs RF on Claritin, Flonase and Tessalon Perles.  Symptoms moderately well controlled.  She still gets a little cough at night for which she takes as needed.  HM:  Due for MMG.  Had COVID booster 08/30/2020.  Will be due for pap this year  Outpatient Encounter Medications as of 02/02/2021  Medication Sig  . albuterol (VENTOLIN HFA) 108 (90 Base) MCG/ACT inhaler Inhale 2 puffs into the lungs every 6 (six) hours as needed for wheezing or  shortness of breath.  amLODipine (NORVASC) 10 MG tablet TAKE 1 TABLET(10 MG) BY MOUTH DAILY  . benzonatate (TESSALON PERLES) 100 MG capsule Take 1 capsule (100 mg total) by mouth 2 (two) times daily as needed for cough. (Patient not taking: Reported on 02/02/2021)  . Blood Pressure Monitor DEVI Use as directed to check home blood pressure 2-3 times a week  . diclofenac sodium (VOLTAREN) 1 % GEL Apply 2 g topically 4 (four) times daily.  . diphenhydrAMINE (BENADRYL) 25 MG tablet Take 1 tablet (25 mg total) by mouth every 6 (six) hours as needed. (Patient not taking: Reported on 12/12/2020)  . DULoxetine (CYMBALTA) 30 MG capsule Take 1 capsule by mouth at bedtime.  EPINEPHrine (EPIPEN 2-PAK) 0.3 mg/0.3 mL IJ SOAJ injection Inject 0.3 mLs (0.3 mg total) into the muscle as needed for anaphylaxis.  . fluticasone (FLONASE) 50 MCG/ACT nasal spray Place 1 spray into both nostrils daily.  gabapentin (NEURONTIN) 300 MG capsule Take 1 capsule (300 mg total) by mouth at bedtime.  linaclotide (LINZESS) 290 MCG CAPS capsule Take 1 capsule (290 mcg total) by mouth daily before breakfast.  . loratadine (CLARITIN) 10 MG tablet Take 1 tablet (10 mg total) by mouth daily.  . meclizine (ANTIVERT) 25 MG tablet Take 1 tablet (25 mg total) by mouth 2 (two) times daily as needed for dizziness.  omeprazole (PRILOSEC) 20 MG  capsule TAKE 1 CAPSULE(20 MG) BY MOUTH TWICE DAILY BEFORE A MEAL  . oxycodone (OXY-IR) 5 MG capsule Take 5 mg by mouth every 8 (eight) hours as needed.  . predniSONE (DELTASONE) 20 MG tablet 1.5 tab PO daily x 2 days then 1 tab daily (Patient not taking: Reported on 02/02/2021)  . rosuvastatin (CRESTOR) 10 MG tablet TAKE 1 TABLET(10 MG) BY MOUTH DAILY. STOP SIMVASTATIN  . spironolactone (ALDACTONE) 25 MG tablet TAKE 1/2 TABLET(12.5 MG) BY MOUTH DAILY (Patient not taking: Reported on 06/27/2020)   No facility-administered encounter medications on file as of 02/02/2021.      Observations/Objective: Results for orders placed or performed in visit on 01/28/20  CBC  Result Value Ref Range   WBC 4.9 3.4 - 10.8 x10E3/uL   RBC 4.27 3.77 - 5.28 x10E6/uL   Hemoglobin 13.0 11.1 - 15.9 g/dL   Hematocrit 65.0 35.4 - 46.6 %   MCV 90 79 - 97 fL   MCH 30.4 26.6 - 33.0 pg   MCHC 34.0 31.5 - 35.7 g/dL   RDW 65.6 81.2 - 75.1 %   Platelets 288 150 - 450 x10E3/uL  Comprehensive metabolic panel  Result Value Ref Range   Glucose 91 65 - 99 mg/dL   BUN 7 6 - 24 mg/dL   Creatinine, Ser 7.00 0.57 - 1.00 mg/dL   GFR calc non Af Amer 78 >59 mL/min/1.73   GFR calc Af Amer 90 >59 mL/min/1.73   BUN/Creatinine Ratio 8 (L) 9 - 23   Sodium 144 134 - 144 mmol/L   Potassium 4.1 3.5 - 5.2 mmol/L   Chloride 103 96 - 106 mmol/L   CO2 27 20 - 29 mmol/L   Calcium 9.7 8.7 - 10.2 mg/dL   Total Protein 7.2 6.0 - 8.5 g/dL   Albumin 4.4 3.8 - 4.9 g/dL   Globulin, Total 2.8 1.5 - 4.5 g/dL   Albumin/Globulin Ratio 1.6 1.2 - 2.2   Bilirubin Total <0.2 0.0 - 1.2 mg/dL   Alkaline Phosphatase 83 39 - 117 IU/L   AST 17 0 - 40 IU/L   ALT 8 0 - 32 IU/L  Lipid panel  Result Value Ref Range   Cholesterol, Total 199 100 - 199 mg/dL   Triglycerides 174 0 - 149 mg/dL   HDL 60 >94 mg/dL   VLDL Cholesterol Cal 21 5 - 40 mg/dL   LDL Chol Calc (NIH) 496 (H) 0 - 99 mg/dL   Chol/HDL Ratio 3.3 0.0 - 4.4 ratio     Assessment and Plan: 1. Essential hypertension Continue current medications and low-salt diet.  Advised to check with her insurance to see whether they would pay for her to get a home blood pressure monitoring device. - CBC; Future - Comprehensive metabolic panel; Future - spironolactone (ALDACTONE) 25 MG tablet; TAKE 1/2 TABLET(12.5 MG) BY MOUTH DAILY  Dispense: 34 tablet; Refill: 3  2. Mixed hyperlipidemia Continue Crestor - Lipid panel; Future  3. Post-nasal drip - loratadine (CLARITIN) 10 MG tablet; Take 1 tablet (10 mg total) by mouth daily.  Dispense: 30 tablet; Refill: 6 -  benzonatate (TESSALON PERLES) 100 MG capsule; Take 1 capsule (100 mg total) by mouth 2 (two) times daily as needed for cough.  Dispense: 30 capsule; Refill: 1 - fluticasone (FLONASE) 50 MCG/ACT nasal spray; Place 1 spray into both nostrils daily.  Dispense: 16 g; Refill: 2  4. Encounter for screening mammogram for malignant neoplasm of breast - MM Digital Screening; Future  5. Chronic constipation -  linaclotide (LINZESS) 290 MCG CAPS capsule; Take 1 capsule (290 mcg total) by mouth daily before breakfast.  Dispense: 30 capsule; Refill: 2  Addendum 02/07/2021:  Chem revel increase BS.  Will add A1C to screen for DM.  Follow Up Instructions: 2 mths for PAP   I discussed the assessment and treatment plan with the patient. The patient was provided an opportunity to ask questions and all were answered. The patient agreed with the plan and demonstrated an understanding of the instructions.   The patient was advised to call back or seek an in-person evaluation if the symptoms worsen or if the condition fails to improve as anticipated.  I  Spent 10 minutes on this telephone encounter  Jonah Blue, MD

## 2021-02-03 ENCOUNTER — Other Ambulatory Visit: Payer: Self-pay | Admitting: Internal Medicine

## 2021-02-03 DIAGNOSIS — E782 Mixed hyperlipidemia: Secondary | ICD-10-CM

## 2021-02-06 ENCOUNTER — Ambulatory Visit: Payer: Medicare Other | Attending: Internal Medicine

## 2021-02-06 ENCOUNTER — Other Ambulatory Visit: Payer: Self-pay

## 2021-02-06 DIAGNOSIS — R739 Hyperglycemia, unspecified: Secondary | ICD-10-CM

## 2021-02-06 DIAGNOSIS — I10 Essential (primary) hypertension: Secondary | ICD-10-CM

## 2021-02-06 DIAGNOSIS — E782 Mixed hyperlipidemia: Secondary | ICD-10-CM

## 2021-02-07 LAB — COMPREHENSIVE METABOLIC PANEL
ALT: 20 IU/L (ref 0–32)
AST: 21 IU/L (ref 0–40)
Albumin/Globulin Ratio: 1.7 (ref 1.2–2.2)
Albumin: 4.3 g/dL (ref 3.8–4.9)
Alkaline Phosphatase: 76 IU/L (ref 44–121)
BUN/Creatinine Ratio: 11 (ref 9–23)
BUN: 8 mg/dL (ref 6–24)
Bilirubin Total: 0.2 mg/dL (ref 0.0–1.2)
CO2: 26 mmol/L (ref 20–29)
Calcium: 9.3 mg/dL (ref 8.7–10.2)
Chloride: 101 mmol/L (ref 96–106)
Creatinine, Ser: 0.7 mg/dL (ref 0.57–1.00)
Globulin, Total: 2.5 g/dL (ref 1.5–4.5)
Glucose: 138 mg/dL — ABNORMAL HIGH (ref 65–99)
Potassium: 4.1 mmol/L (ref 3.5–5.2)
Sodium: 142 mmol/L (ref 134–144)
Total Protein: 6.8 g/dL (ref 6.0–8.5)
eGFR: 101 mL/min/{1.73_m2} (ref >59)

## 2021-02-07 LAB — LIPID PANEL
Chol/HDL Ratio: 3.3 ratio (ref 0.0–4.4)
Cholesterol, Total: 204 mg/dL — ABNORMAL HIGH (ref 100–199)
HDL: 61 mg/dL (ref >39)
LDL Chol Calc (NIH): 121 mg/dL — ABNORMAL HIGH (ref 0–99)
Triglycerides: 125 mg/dL (ref 0–149)
VLDL Cholesterol Cal: 22 mg/dL (ref 5–40)

## 2021-02-07 LAB — CBC
Hematocrit: 37.8 % (ref 34.0–46.6)
Hemoglobin: 12.5 g/dL (ref 11.1–15.9)
MCH: 29.7 pg (ref 26.6–33.0)
MCHC: 33.1 g/dL (ref 31.5–35.7)
MCV: 90 fL (ref 79–97)
Platelets: 260 10*3/uL (ref 150–450)
RBC: 4.21 x10E6/uL (ref 3.77–5.28)
RDW: 12.7 % (ref 11.7–15.4)
WBC: 5.2 10*3/uL (ref 3.4–10.8)

## 2021-02-07 NOTE — Addendum Note (Signed)
Addended by: Jonah Blue B on: 02/07/2021 08:00 AM   Modules accepted: Orders

## 2021-02-08 ENCOUNTER — Telehealth: Payer: Self-pay

## 2021-02-08 DIAGNOSIS — E782 Mixed hyperlipidemia: Secondary | ICD-10-CM

## 2021-02-08 MED ORDER — ROSUVASTATIN CALCIUM 20 MG PO TABS: 20.0000 mg | ORAL_TABLET | Freq: Every day | ORAL | 4 refills | Status: DC

## 2021-02-08 NOTE — Telephone Encounter (Signed)
Contacted pt to go over lab results pt is aware and doesn't have any questions or concerns   Pt is aware that she needs to increase cholesterol medication. Please send in update rx

## 2021-02-08 NOTE — Progress Notes (Signed)
Let patient know that her blood sugar was mildly elevated.  I have asked the lab to check a level called an A1c to screen for diabetes.  I will let her know once we get the results.  Kidney and liver function tests normal.  LDL cholesterol is 121 with goal being less than 100.  Please confirm that she was taking the rosuvastatin consistently every day prior to the recent blood draw.  If she was not taking it consistently, she should start doing so.  If she was taking it consistently, then we will need to increase the dose from 10 mg daily to 20 mg daily.  Please let me know so that I can send an updated prescription depending on her answer.  Blood cell counts including red blood cell, white blood cell and platelet counts are normal.

## 2021-02-08 NOTE — Addendum Note (Signed)
Addended by: Jonah Blue B on: 02/08/2021 05:09 PM   Modules accepted: Orders

## 2021-02-10 ENCOUNTER — Telehealth: Payer: Self-pay

## 2021-02-10 LAB — SPECIMEN STATUS REPORT

## 2021-02-10 LAB — HEMOGLOBIN A1C
Est. average glucose Bld gHb Est-mCnc: 131 mg/dL
Hgb A1c MFr Bld: 6.2 % — ABNORMAL HIGH (ref 4.8–5.6)

## 2021-02-10 NOTE — Telephone Encounter (Signed)
Contacted pt to go over lab results pt didn't answer lvm  

## 2021-03-02 DIAGNOSIS — M5416 Radiculopathy, lumbar region: Secondary | ICD-10-CM | POA: Diagnosis not present

## 2021-03-02 DIAGNOSIS — G2581 Restless legs syndrome: Secondary | ICD-10-CM | POA: Diagnosis not present

## 2021-03-02 DIAGNOSIS — M545 Low back pain, unspecified: Secondary | ICD-10-CM | POA: Diagnosis not present

## 2021-03-15 DIAGNOSIS — M5416 Radiculopathy, lumbar region: Secondary | ICD-10-CM | POA: Diagnosis not present

## 2021-03-21 ENCOUNTER — Other Ambulatory Visit: Payer: Self-pay

## 2021-03-21 ENCOUNTER — Ambulatory Visit: Payer: Medicare Other | Attending: Internal Medicine | Admitting: Internal Medicine

## 2021-03-21 ENCOUNTER — Encounter: Payer: Self-pay | Admitting: Internal Medicine

## 2021-03-21 ENCOUNTER — Other Ambulatory Visit (HOSPITAL_COMMUNITY)
Admission: RE | Admit: 2021-03-21 | Discharge: 2021-03-21 | Disposition: A | Discharge status: Home / Self Care | Payer: Medicare Other | Source: Ambulatory Visit | Attending: Internal Medicine | Admitting: Internal Medicine

## 2021-03-21 VITALS — BP 122/80 | HR 93 | Resp 16 | Wt 161.4 lb

## 2021-03-21 DIAGNOSIS — Z01419 Encounter for gynecological examination (general) (routine) without abnormal findings: Secondary | ICD-10-CM | POA: Insufficient documentation

## 2021-03-21 DIAGNOSIS — Z1151 Encounter for screening for human papillomavirus (HPV): Secondary | ICD-10-CM | POA: Insufficient documentation

## 2021-03-21 DIAGNOSIS — Z124 Encounter for screening for malignant neoplasm of cervix: Secondary | ICD-10-CM | POA: Insufficient documentation

## 2021-03-21 DIAGNOSIS — R1031 Right lower quadrant pain: Secondary | ICD-10-CM

## 2021-03-21 DIAGNOSIS — R42 Dizziness and giddiness: Secondary | ICD-10-CM | POA: Diagnosis not present

## 2021-03-21 LAB — POCT URINALYSIS DIP (CLINITEK)
Bilirubin, UA: NEGATIVE
Blood, UA: NEGATIVE
Glucose, UA: NEGATIVE mg/dL
Ketones, POC UA: NEGATIVE mg/dL
Nitrite, UA: NEGATIVE
POC PROTEIN,UA: NEGATIVE
Spec Grav, UA: >=1.03 — AB (ref 1.010–1.025)
Urobilinogen, UA: 0.2 E.U./dL
pH, UA: 5.5 (ref 5.0–8.0)

## 2021-03-21 NOTE — Progress Notes (Signed)
Pt states she is having right lower abdominal pain   Pt states the pain wakes her up out of he sleep   Pt states the pain is more of a nagging pain

## 2021-03-21 NOTE — Progress Notes (Signed)
Patient ID: [name], female    DOB: 1966/03/03  MRN: [id number]  CC: Abdominal Pain (right)   Subjective: [name] is a 55 y.o. female who presents for UC visit Her concerns today include:  Pt with hx of HTN, chronic constipation,anal fistulas,vertigo, HL, chronic back pain (followed by pain specialist), hot flashes (on gabapentin)and GERD  Patient complains of pain in the right lower abdomen x3 days.  Initially patient stated the pain was constant but then later stated that it is more bothersome at night.  During the day she may get a quick sharp pain in the area but not as bothersome.  Pain is sharp in character.  No associated dysuria, vaginal discharge, nausea/vomiting/diarrhea or fever.  She has not been sexually active in several years.  Moving her bowels okay.  No blood in the stools.  Pain is no worse or better with food.  She has not done any heavy lifting or excessive bending over the past week that she thinks may have contributed.  Other concern is intermittent dizziness over the past 2 weeks.  Occurs with position changes.  She also notices it when moving around in bed.  No hearing changes or ringing in the ears.  Episodes last several seconds.  Reports episodes of vertigo in the past. Patient Active Problem List   Diagnosis Date Noted  . Altered consciousness 12/10/2018  . Carpal tunnel syndrome of right wrist 06/12/2018  . Postnasal drip 06/12/2018  . Vertigo 10/18/2016  . Disturbance of skin sensation 04/19/2014  . Anal fistula 04/14/2013  . UNSPECIFIED SITE OF SPRAIN AND STRAIN 07/07/2009  . PEDAL EDEMA 06/29/2009  . MUSCLE PAIN 06/23/2009  . 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 Allergic rhinitis 03/11/2009  . SYNCOPE, VASOVAGAL 03/11/2009  . HEADACHE 03/11/2009     Current Outpatient Medications on File Prior to Visit  Medication Sig Dispense Refill  . albuterol (VENTOLIN HFA) 108 (90 Base) MCG/ACT inhaler Inhale 2 puffs into the lungs every 6 (six) hours  as needed for wheezing or shortness of breath. 6.7 g 1  . amLODipine (NORVASC) 10 MG tablet TAKE 1 TABLET(10 MG) BY MOUTH DAILY 90 tablet 0  . benzonatate (TESSALON PERLES) 100 MG capsule Take 1 capsule (100 mg total) by mouth 2 (two) times daily as needed for cough. 30 capsule 1  . Blood Pressure Monitor DEVI Use as directed to check home blood pressure 2-3 times a week 1 Device 0  . diclofenac sodium (VOLTAREN) 1 % GEL Apply 2 g topically 4 (four) times daily. 100 g 0  . diphenhydrAMINE (BENADRYL) 25 MG tablet Take 1 tablet (25 mg total) by mouth every 6 (six) hours as needed. (Patient not taking: Reported on 12/12/2020) 20 tablet 0  . DULoxetine (CYMBALTA) 30 MG capsule Take 1 capsule by mouth at bedtime.    Marland Kitchen. EPINEPHrine (EPIPEN 2-PAK) 0.3 mg/0.3 mL IJ SOAJ injection Inject 0.3 mLs (0.3 mg total) into the muscle as needed for anaphylaxis. 1 each 0  . fluticasone (FLONASE) 50 MCG/ACT nasal spray Place 1 spray into both nostrils daily. 16 g 2  . gabapentin (NEURONTIN) 300 MG capsule Take 1 capsule (300 mg total) by mouth at bedtime. 30 capsule 3  . linaclotide (LINZESS) 290 MCG CAPS capsule Take 1 capsule (290 mcg total) by mouth daily before breakfast. 30 capsule 2  . loratadine (CLARITIN) 10 MG tablet Take 1 tablet (10 mg total) by mouth daily. 30 tablet 6  . meclizine (ANTIVERT) 25 MG tablet Take  1 tablet (25 mg total) by mouth 2 (two) times daily as needed for dizziness. 20 tablet 0  . omeprazole (PRILOSEC) 20 MG capsule TAKE 1 CAPSULE(20 MG) BY MOUTH TWICE DAILY BEFORE A MEAL 180 capsule 3  . oxycodone (OXY-IR) 5 MG capsule Take 5 mg by mouth every 8 (eight) hours as needed.    . rosuvastatin (CRESTOR) 20 MG tablet Take 1 tablet (20 mg total) by mouth daily. 30 tablet 4  . spironolactone (ALDACTONE) 25 MG tablet TAKE 1/2 TABLET(12.5 MG) BY MOUTH DAILY 34 tablet 3   No current facility-administered medications on file prior to visit.    Allergies  Allergen Reactions  . Ampicillin  Anaphylaxis  . Atorvastatin Calcium Other (See Comments)    Pt reported lip swelling  . Penicillins Anaphylaxis  . Sulfonamide Derivatives Anaphylaxis  . Hydrocodone-Acetaminophen Hives    Have to take a Benadryl to take med  . Tramadol Itching  . Tylenol [Acetaminophen] Itching    Social History   Socioeconomic History  . Marital status: Single    Spouse name: Not on file  . Number of children: 2  . Years of education: 10  . Highest education level: Not on file  Occupational History  . Occupation: N/A  Tobacco Use  . Smoking status: Never Smoker  . Smokeless tobacco: Never Used  Substance and Sexual Activity  . Alcohol use: Yes    Alcohol/week: 0.0 standard drinks    Comment: occa  . Drug use: No  . Sexual activity: Yes    Birth control/protection: Pill  Other Topics Concern  . Not on file  Social History Narrative   Lives at home w/ her mother   Right-handed   Caffeine: 1 cup of tea per day   Social Determinants of Health   Financial Resource Strain: Not on file  Food Insecurity: Not on file  Transportation Needs: Not on file  Physical Activity: Not on file  Stress: Not on file  Social Connections: Not on file  Intimate Partner Violence: Not on file    Family History  Problem Relation Age of Onset  . Diabetes Mother   . Heart attack Father   . Diabetes Brother   . Diabetes Maternal Uncle   . Diabetes Maternal Aunt   . Seizures Sister   . Colon cancer Neg Hx   . Stomach cancer Neg Hx     Past Surgical History:  Procedure Laterality Date  . ANAL FISTULECTOMY  08/22/2012   Procedure: FISTULECTOMY ANAL;  Surgeon: Lodema Pilot, DO;  Location: [facility];  Service: General;  Laterality: N/A;  rectal examination under anesthesia with seton placement   . ANAL FISTULECTOMY  09/17/2012   Procedure: FISTULECTOMY ANAL;  Surgeon: Lodema Pilot, DO;  Location: [facility];  Service: General;  Laterality: N/A;  possible fistulotomy  . D & C HYSTEROSCOPY/  NOVASURE ENDOMETRIAL ABLATION/ I & D THROMBOSED HEMORROID  01-10-2007  DR Miguel Aschoff  . EXAMINATION UNDER ANESTHESIA  09/17/2012   Procedure: EXAM UNDER ANESTHESIA;  Surgeon: Lodema Pilot, DO;  Location: [facility];  Service: General;  Laterality: N/A;  Rectal exam under anesthesia  . EXAMINATION UNDER ANESTHESIA N/A 05/14/2013   Procedure: EXAM UNDER ANESTHESIA;  Surgeon: Romie Levee, MD;  Location: [facility];  Service: General;  Laterality: N/A;  . FISTULA PLUG  09/17/2012   Procedure: FISTULA PLUG;  Surgeon: Lodema Pilot, DO;  Location: [facility];  Service: General;  Laterality: N/A;  Possible  fistula plug  . HEMORRHOID SURGERY  04-28-2009   [facility] INTERNAL HEMORRHOIDS  . IRRIGATION AND DEBRIDEMENT ABSCESS  07/08/2012   Procedure: MINOR INCISION AND DRAINAGE OF ABSCESS;  Surgeon: Geryl Rankins, MD;  Location: [facility];  Service: Gynecology;  Laterality: N/A;  Vulvar Abscess  . PLACEMENT OF SETON N/A 05/14/2013   Procedure:  PLACEMENT OF SETON;  Surgeon: Romie Levee, MD;  Location: [facility];  Service: General;  Laterality: N/A;  . RECTAL EXAM UNDER ANESTHESIA N/A 08/20/2013   Procedure: mucosal advancement flap ;  Surgeon: Romie Levee, MD;  Location: [facility];  Service: General;  Laterality: N/A;  parks anal retractor long rectal instrucments prone jack knife anal fistula   . RECTAL ULTRASOUND N/A 07/20/2013   Procedure: RECTAL ULTRASOUND;  Surgeon: Romie Levee, MD;  Location: [facility];  Service: Endoscopy;  Laterality: N/A;    ROS: Review of Systems Negative except as stated above  PHYSICAL EXAM: BP 122/80   Pulse 93   Resp 16   Wt 161 lb 6.4 oz (73.2 kg)   SpO2 96%   BMI 27.70 kg/m   Physical Exam Blood pressure sitting 108/74, pulse of 88 Blood pressure standing 106/72, pulse of 91  General appearance - alert, well appearing, middle-aged African-American female and in no distress Mental  status - normal mood, behavior, speech, dress, motor activity, and thought processes Eyes: Conjunctiva looks pale Ears - bilateral TM's and external ear canals normal Mouth -oral mucosa is moist Neck - supple, no significant adenopathy.  No carotid bruits heard Chest - clear to auscultation, no wheezes, rales or rhonchi, symmetric air entry Heart - normal rate, regular rhythm, normal S1, S2, no murmurs, rubs, clicks or gallops Abdomen -nondistended.  Bowel sounds are normal.  Soft, slight right suprapubic tenderness without guarding or rebound.  No organomegaly.   Pelvic -CMA Ms. Donata Duff is present: Scarring noted on the left side of the perineum from previous episiotomy.  No external vaginal lesions.  Vaginal mucosa slightly dry.  Small amount of white discharge around the cervix.  Cervix appears grossly normal.  No cervical motion tenderness or adnexal masses.  Uterus feels normal size and consistency. Neurological - cranial nerves II through XII intact, motor and sensory grossly normal bilaterally, Romberg sign negative, normal gait and station.  Dix-Hallpike maneuver positive on the right Extremities - peripheral pulses normal, no pedal edema, no clubbing or cyanosis   CMP Latest Ref Rng & Units 02/06/2021 01/28/2020 04/01/2019  Glucose 65 - 99 mg/dL 354(S) 91 93  BUN 6 - 24 mg/dL 8 7 7   Creatinine 0.57 - 1.00 mg/dL 5.68 1.27  Sodium 134 - 144 mmol/L 142 144 138  Potassium 3.5 - 5.2 mmol/L 4.1 4.1 4.2  Chloride 96 - 106 mmol/L 101 103 104  CO2 20 - 29 mmol/L 26 27 25   Calcium 8.7 - 10.2 mg/dL 9.3 9.7 9.2  Total Protein 6.0 - 8.5 g/dL 6.8 7.2 -  Total Bilirubin 0.0 - 1.2 mg/dL 0.2 5.17 -  Alkaline Phos 44 - 121 IU/L 76 83 -  AST 0 - 40 IU/L 21 17 -  ALT 0 - 32 IU/L 20 8 -   Lipid Panel     Component Value Date/Time   CHOL 204 (H) 02/06/2021 1611   TRIG 125 02/06/2021 1611   HDL 61 02/06/2021 1611   CHOLHDL 3.3 02/06/2021 1611   CHOLHDL 3.8 03/19/2009 0414   VLDL 13 03/19/2009  0414   LDLCALC 121 (H)  02/06/2021 1611    CBC    Component Value Date/Time   WBC 5.2 02/06/2021 1611   WBC 5.5 04/01/2019 1054   RBC 4.21 02/06/2021 1611   RBC 4.34 04/01/2019 1054   HGB 12.5 02/06/2021 1611   HGB 13.8 09/05/2009 1347   HCT 37.8 02/06/2021 1611   HCT 41.3 09/05/2009 1347   PLT 260 02/06/2021 1611   MCV 90 02/06/2021 1611   MCV 92.0 09/05/2009 1347   MCH 29.7 02/06/2021 1611   MCH 29.5 04/01/2019 1054   MCHC 33.1 02/06/2021 1611   MCHC 32.3 04/01/2019 1054   RDW 12.7 02/06/2021 1611   RDW 13.0 09/05/2009 1347   LYMPHSABS 2.2 04/01/2019 1054   LYMPHSABS 2.9 03/16/2019 1427   LYMPHSABS 2.1 09/05/2009 1347   MONOABS 0.4 04/01/2019 1054   MONOABS 0.3 09/05/2009 1347   EOSABS 0.1 04/01/2019 1054   EOSABS 0.1 03/16/2019 1427   BASOSABS 0.1 04/01/2019 1054   BASOSABS 0.1 03/16/2019 1427   BASOSABS 0.1 09/05/2009 1347   Results for orders placed or performed in visit on 03/21/21  POCT URINALYSIS DIP (CLINITEK)  Result Value Ref Range   Color, UA yellow yellow   Clarity, UA clear clear   Glucose, UA negative negative mg/dL   Bilirubin, UA negative negative   Ketones, POC UA negative negative mg/dL   Spec Grav, UA >=4.163 (A) 1.010 - 1.025   Blood, UA negative negative   pH, UA 5.5 5.0 - 8.0   POC PROTEIN,UA negative negative, trace   Urobilinogen, UA 0.2 0.2 or 1.0 E.U./dL   Nitrite, UA Negative Negative   Leukocytes, UA Trace (A) Negative    ASSESSMENT AND PLAN: 1. Right lower quadrant abdominal pain Differential diagnosis can include ovarian cyst, mild appendicitis, abdominal or pelvic pathology or MSK in nature -Advised patient that we can do a CAT scan of the abdomen and pelvis with oral contrast.  There is currently a shortage of IV contrast and we are advised to hold off on ordering CAT scans with contrast unless it is an emergency.  Patient declined imaging at this time stating she prefers to wait another day or 2 to see if this resolves on its  own.  Advised her that if the pain gets worse or she develops vomiting, fever or pain starts radiating, she should follow-up immediately or be seen in the emergency room for advanced imaging.  Also asked that she call us back in 2 days to let me know whether things are improving.  She is agreeable to this plan. - POCT URINALYSIS DIP (CLINITEK)  2. Pap smear for cervical cancer screening Coming due for Pap smear at the end of this month.  She is agreeable to doing it today. - Cytology - PAP - Cervicovaginal ancillary only  3. Dizziness Possibly benign positional vertigo.  We will check CBC also to rule out anemia.  Last CBC done in April was within normal range but I note that her hemoglobin has been trending down over the past few years. -Discussed Epley maneuver with her and printed information given from [email or web address] with patient family diagram of how to do the maneuvers at home - CBC Advised to follow-up if no improvement.    Patient was given the opportunity to ask questions.  Patient verbalized understanding of the plan and was able to repeat key elements of the plan.   Orders Placed This Encounter  Procedures  . CBC  . POCT URINALYSIS DIP (CLINITEK)     Requested  Prescriptions    No prescriptions requested or ordered in this encounter    No follow-ups on file.  Karle Plumber, MD, FACP

## 2021-03-21 NOTE — Patient Instructions (Signed)
Please call me back in 24-48 hours to let me know if pain in right lower abdomen is getting better or resolves.  If it gets worse, or you develop nausea/vomiting or fever with it, you should be seen in the emergency room.    Go slow with position changes. Stop at the lab for blood count today. Please do the head exercises as discussed today.  Please follow up if no improvement.    Benign Positional Vertigo Vertigo is the feeling that you or your surroundings are moving when they are not. Benign positional vertigo is the most common form of vertigo. This is usually a harmless condition (benign). This condition is positional. This means that symptoms are triggered by certain movements and positions. This condition can be dangerous if it occurs while you are doing something that could cause harm to you or others. This includes activities such as driving or operating machinery. What are the causes? The inner ear has fluid-filled canals that help your brain sense movement and balance. When the fluid moves, the brain receives messages about your body's position. With benign positional vertigo, crystals in the inner ear break free and disturb the inner ear area. This causes your brain to receive confusing messages about your body's position. What increases the risk? You are more likely to develop this condition if:  You are a woman.  You are 27 years of age or older.  You have recently had a head injury.  You have an inner ear disease. What are the signs or symptoms? Symptoms of this condition usually happen when you move your head or your eyes in different directions. Symptoms may start suddenly, and usually last for less than a minute. They include:  Loss of balance and falling.  Feeling like you are spinning or moving.  Feeling like your surroundings are spinning or moving.  Nausea and vomiting.  Blurred vision.  Dizziness.  Involuntary eye movement (nystagmus). Symptoms can be  mild and cause only minor problems, or they can be severe and interfere with daily life. Episodes of benign positional vertigo may return (recur) over time. Symptoms may improve over time. How is this diagnosed? This condition may be diagnosed based on:  Your medical history.  Physical exam of the head, neck, and ears.  Positional tests to check for or stimulate vertigo. You may be asked to turn your head and change positions, such as going from sitting to lying down. A health care provider will watch for symptoms of vertigo. You may be referred to a health care provider who specializes in ear, nose, and throat problems (ENT, or otolaryngologist) or a provider who specializes in disorders of the nervous system (neurologist). How is this treated? This condition may be treated in a session in which your health care provider moves your head in specific positions to help the displaced crystals in your inner ear move. Treatment for this condition may take several sessions. Surgery may be needed in severe cases, but this is rare. In some cases, benign positional vertigo may resolve on its own in 2-4 weeks.   Follow these instructions at home: Safety  Move slowly. Avoid sudden body or head movements or certain positions, as told by your health care provider.  Avoid driving until your health care provider says it is safe for you to do so.  Avoid operating heavy machinery until your health care provider says it is safe for you to do so.  Avoid doing any tasks that would be dangerous  to you or others if vertigo occurs.  If you have trouble walking or keeping your balance, try using a cane for stability. If you feel dizzy or unstable, sit down right away.  Return to your normal activities as told by your health care provider. Ask your health care provider what activities are safe for you. General instructions  Take over-the-counter and prescription medicines only as told by your health care  provider.  Drink enough fluid to keep your urine pale yellow.  Keep all follow-up visits as told by your health care provider. This is important. Contact a health care provider if:  You have a fever.  Your condition gets worse or you develop new symptoms.  Your family or friends notice any behavioral changes.  You have nausea or vomiting that gets worse.  You have numbness or a prickling and tingling sensation. Get help right away if you:  Have difficulty speaking or moving.  Are always dizzy.  Faint.  Develop severe headaches.  Have weakness in your legs or arms.  Have changes in your hearing or vision.  Develop a stiff neck.  Develop sensitivity to light. Summary  Vertigo is the feeling that you or your surroundings are moving when they are not. Benign positional vertigo is the most common form of vertigo.  This condition is caused by crystals in the inner ear that become displaced. This causes a disturbance in an area of the inner ear that helps your brain sense movement and balance.  Symptoms include loss of balance and falling, feeling that you or your surroundings are moving, nausea and vomiting, and blurred vision.  This condition can be diagnosed based on symptoms, a physical exam, and positional tests.  Follow safety instructions as told by your health care provider. You will also be told when to contact your health care provider in case of problems. This information is not intended to replace advice given to you by your health care provider. Make sure you discuss any questions you have with your health care provider. Document Revised: 09/15/2019 Document Reviewed: 04/02/2018 Elsevier Patient Education  2021 [employer].

## 2021-03-22 ENCOUNTER — Other Ambulatory Visit: Payer: Self-pay | Admitting: Internal Medicine

## 2021-03-22 LAB — CBC
Hematocrit: 42 % (ref 34.0–46.6)
Hemoglobin: 13.8 g/dL (ref 11.1–15.9)
MCH: 29.8 pg (ref 26.6–33.0)
MCHC: 32.9 g/dL (ref 31.5–35.7)
MCV: 91 fL (ref 79–97)
Platelets: 291 10*3/uL (ref 150–450)
RBC: 4.63 x10E6/uL (ref 3.77–5.28)
RDW: 12.8 % (ref 11.7–15.4)
WBC: 6.2 10*3/uL (ref 3.4–10.8)

## 2021-03-22 LAB — CERVICOVAGINAL ANCILLARY ONLY
Bacterial Vaginitis (gardnerella): POSITIVE — AB
Candida Glabrata: NEGATIVE
Candida Vaginitis: NEGATIVE
Chlamydia: NEGATIVE
Comment: NEGATIVE
Comment: NEGATIVE
Comment: NEGATIVE
Comment: NEGATIVE
Comment: NEGATIVE
Comment: NORMAL
Neisseria Gonorrhea: NEGATIVE
Trichomonas: NEGATIVE

## 2021-03-22 MED ORDER — METRONIDAZOLE 500 MG PO TABS: 500.0000 mg | ORAL_TABLET | Freq: Two times a day (BID) | ORAL | 0 refills | Status: DC

## 2021-03-23 LAB — CYTOLOGY - PAP
Comment: NEGATIVE
Diagnosis: NEGATIVE
High risk HPV: NEGATIVE

## 2021-03-27 ENCOUNTER — Telehealth: Payer: Self-pay

## 2021-03-27 NOTE — Telephone Encounter (Signed)
Contacted pt to go over lab results pt didn't answer lvm  

## 2021-04-04 ENCOUNTER — Other Ambulatory Visit: Payer: Self-pay | Admitting: Internal Medicine

## 2021-04-04 DIAGNOSIS — K5909 Other constipation: Secondary | ICD-10-CM

## 2021-04-08 ENCOUNTER — Other Ambulatory Visit: Payer: Self-pay | Admitting: Internal Medicine

## 2021-04-08 DIAGNOSIS — I10 Essential (primary) hypertension: Secondary | ICD-10-CM

## 2021-04-26 DIAGNOSIS — M545 Low back pain, unspecified: Secondary | ICD-10-CM | POA: Diagnosis not present

## 2021-04-26 DIAGNOSIS — Z79891 Long term (current) use of opiate analgesic: Secondary | ICD-10-CM | POA: Diagnosis not present

## 2021-04-26 DIAGNOSIS — M5416 Radiculopathy, lumbar region: Secondary | ICD-10-CM | POA: Diagnosis not present

## 2021-04-26 DIAGNOSIS — M461 Sacroiliitis, not elsewhere classified: Secondary | ICD-10-CM | POA: Diagnosis not present

## 2021-04-26 DIAGNOSIS — G2581 Restless legs syndrome: Secondary | ICD-10-CM | POA: Diagnosis not present

## 2021-05-11 ENCOUNTER — Telehealth: Payer: Self-pay | Admitting: Internal Medicine

## 2021-05-11 ENCOUNTER — Other Ambulatory Visit: Payer: Self-pay | Admitting: Internal Medicine

## 2021-05-11 NOTE — Telephone Encounter (Signed)
Medication Refill - Medication: meclizine (ANTIVERT) 25 MG tablet   Has the patient contacted their pharmacy? No.  Preferred Pharmacy (with phone number or street name): [id number] - [place],  - [address] AT [facility] OF [address]  Phone:  [phone] Fax:  [phone]   Agent: Please be advised that RX refills may take up to 3 business days. We ask that you follow-up with your pharmacy.

## 2021-05-11 NOTE — Telephone Encounter (Signed)
Duplicate request Already sent to office for approval

## 2021-05-15 ENCOUNTER — Other Ambulatory Visit: Payer: Self-pay | Admitting: Internal Medicine

## 2021-05-15 DIAGNOSIS — R0982 Postnasal drip: Secondary | ICD-10-CM

## 2021-05-23 DIAGNOSIS — R1031 Right lower quadrant pain: Secondary | ICD-10-CM | POA: Diagnosis not present

## 2021-05-24 DIAGNOSIS — M5416 Radiculopathy, lumbar region: Secondary | ICD-10-CM | POA: Diagnosis not present

## 2021-05-28 ENCOUNTER — Other Ambulatory Visit: Payer: Self-pay | Admitting: Internal Medicine

## 2021-05-28 DIAGNOSIS — K219 Gastro-esophageal reflux disease without esophagitis: Secondary | ICD-10-CM

## 2021-05-28 NOTE — Telephone Encounter (Signed)
Refilled today 05/28/21 

## 2021-05-28 NOTE — Telephone Encounter (Signed)
Requested Prescriptions  Pending Prescriptions Disp Refills  . omeprazole (PRILOSEC) 20 MG capsule [Pharmacy Med Name: OMEPRAZOLE 20MG  CAPSULES] 60 capsule 0    Sig: TAKE 1 CAPSULE(20 MG) BY MOUTH TWICE DAILY BEFORE A MEAL     Gastroenterology: Proton Pump Inhibitors Passed - 05/28/2021  6:51 AM      Passed - Valid encounter within last 12 months    Recent Outpatient Visits          2 months ago Right lower quadrant abdominal pain   [facility] 05/30/2021, MD   3 months ago Essential hypertension   [facility] Marcine Matar, MD   5 months ago Dermatitis   [facility], MD   7 months ago Essential hypertension   [facility], MD   10 months ago Hot flashes due to menopause   [facility], MD

## 2021-05-30 DIAGNOSIS — R1031 Right lower quadrant pain: Secondary | ICD-10-CM | POA: Diagnosis not present

## 2021-05-30 DIAGNOSIS — N390 Urinary tract infection, site not specified: Secondary | ICD-10-CM | POA: Diagnosis not present

## 2021-06-09 ENCOUNTER — Telehealth: Payer: Self-pay | Admitting: Internal Medicine

## 2021-06-09 ENCOUNTER — Other Ambulatory Visit: Payer: Self-pay | Admitting: Internal Medicine

## 2021-06-09 DIAGNOSIS — R0982 Postnasal drip: Secondary | ICD-10-CM

## 2021-06-09 DIAGNOSIS — E782 Mixed hyperlipidemia: Secondary | ICD-10-CM

## 2021-06-09 MED ORDER — ROSUVASTATIN CALCIUM 20 MG PO TABS
20.0000 mg | ORAL_TABLET | Freq: Every day | ORAL | 4 refills | Status: DC
Start: 2021-06-09 — End: 2022-04-09

## 2021-06-09 MED ORDER — BENZONATATE 100 MG PO CAPS
ORAL_CAPSULE | ORAL | 1 refills | Status: DC
Start: 2021-06-09 — End: 2021-07-09

## 2021-06-09 NOTE — Telephone Encounter (Signed)
Will forward to provider  

## 2021-06-09 NOTE — Telephone Encounter (Signed)
Medication: benzonatate (TESSALON) 100 MG capsule [[id number]] , rosuvastatin (CRESTOR) 20 MG tablet [[id number]]   Has the patient contacted their pharmacy? YES  (Agent: If no, request that the patient contact the pharmacy for the refill.) (Agent: If yes, when and what did the pharmacy advise?)  Preferred Pharmacy (with phone number or street name): [id number] - [place], [place] - [address] AT [facility] OF [address]  [place] [id number] Phone: [phone] Fax: [phone] Hours: Open 24 hours    Agent: Please be advised that RX refills may take up to 3 business days. We ask that you follow-up with your pharmacy.

## 2021-06-09 NOTE — Telephone Encounter (Signed)
Pt was seen on 03/21/21 for Right lower quadrant abdominal pain pt is calling to report that she is still in pain. [facility] and they did not find anything. Wanting to know does she need to be reseen or can something be prescribed? Preferred Pharmacy [facility] [address]

## 2021-06-12 NOTE — Telephone Encounter (Signed)
Pleaser contact pt and schedule an appt with any available provider or refer to mobile

## 2021-06-12 NOTE — Telephone Encounter (Signed)
Called patient and no answer. Left vm to call [phone] to get an appointment

## 2021-06-13 ENCOUNTER — Other Ambulatory Visit: Payer: Self-pay

## 2021-06-13 ENCOUNTER — Ambulatory Visit: Payer: Medicare Other | Attending: Internal Medicine | Admitting: Internal Medicine

## 2021-06-13 ENCOUNTER — Encounter: Payer: Self-pay | Admitting: Internal Medicine

## 2021-06-13 VITALS — BP 111/78 | HR 100 | Resp 16 | Wt 156.2 lb

## 2021-06-13 DIAGNOSIS — R1031 Right lower quadrant pain: Secondary | ICD-10-CM | POA: Diagnosis not present

## 2021-06-13 NOTE — Progress Notes (Signed)
Patient ID: [name], female    DOB: 1966/08/29  MRN: [id number]  CC: Abdominal Pain   Subjective: [name] is a 55 y.o. female who presents for abd pain Her concerns today include:  Pt with hx of HTN, chronic constipation, anal fistulas, vertigo, HL, chronic back pain (followed by pain specialist), hot flashes (on gabapentin) and GERD  Patient was seen in May of this year complaining of right lower abdominal pain that was going on for 3 days at the time.  The pain was intermittent and sharp in character.  It was not associated with food.  She did not have any nausea.  She had no vaginal discharge or dysuria.  We did an abdominal and pelvic exam.  Only significant finding was slight right suprapubic tenderness without guarding or rebound.  Differential diagnosis with ovarian cysts, mild appendicitis or other abdominal pathology.  We discussed the imaging study at that time but patient decided to hold off. UA was negative and STI screen was negative from pelvic exam. She presents today stating that the pain is still there.  It goes and comes but has been more bothersome lately and longer.  It is still sharp in character.  No association with food.  Bowel movements have been regular.  No nausea or vomiting.  She has not had any fever.  No dysuria or vaginal discharge.  She is currently not sexually active. Patient Active Problem List   Diagnosis Date Noted   Altered consciousness 12/10/2018   Carpal tunnel syndrome of right wrist 06/12/2018   Postnasal drip 06/12/2018   Vertigo 10/18/2016   Disturbance of skin sensation 04/19/2014   Anal fistula 04/14/2013   UNSPECIFIED SITE OF SPRAIN AND STRAIN 07/07/2009   PEDAL EDEMA 06/29/2009   MUSCLE PAIN 06/23/2009   ANEMIA-IRON DEFICIENCY 03/11/2009   Allergic rhinitis 03/11/2009   SYNCOPE, VASOVAGAL 03/11/2009   HEADACHE 03/11/2009     Current Outpatient Medications on File Prior to Visit  Medication Sig Dispense Refill   albuterol  (VENTOLIN HFA) 108 (90 Base) MCG/ACT inhaler Inhale 2 puffs into the lungs every 6 (six) hours as needed for wheezing or shortness of breath. 6.7 g 1   amLODipine (NORVASC) 10 MG tablet TAKE 1 TABLET(10 MG) BY MOUTH DAILY 90 tablet 0   benzonatate (TESSALON) 100 MG capsule TAKE 1 CAPSULE(100 MG) BY MOUTH TWICE DAILY AS NEEDED FOR COUGH 30 capsule 1   Blood Pressure Monitor DEVI Use as directed to check home blood pressure 2-3 times a week 1 Device 0   diclofenac sodium (VOLTAREN) 1 % GEL Apply 2 g topically 4 (four) times daily. (Patient not taking: Reported on 06/13/2021) 100 g 0   diphenhydrAMINE (BENADRYL) 25 MG tablet Take 1 tablet (25 mg total) by mouth every 6 (six) hours as needed. (Patient not taking: Reported on 12/12/2020) 20 tablet 0   DULoxetine (CYMBALTA) 30 MG capsule Take 1 capsule by mouth at bedtime.     EPINEPHrine (EPIPEN 2-PAK) 0.3 mg/0.3 mL IJ SOAJ injection Inject 0.3 mLs (0.3 mg total) into the muscle as needed for anaphylaxis. 1 each 0   fluticasone (FLONASE) 50 MCG/ACT nasal spray Place 1 spray into both nostrils daily. 16 g 2   gabapentin (NEURONTIN) 300 MG capsule Take 1 capsule (300 mg total) by mouth at bedtime. 30 capsule 3   LINZESS 290 MCG CAPS capsule TAKE 1 CAPSULE(290 MCG) BY MOUTH DAILY BEFORE BREAKFAST 30 capsule 2   loratadine (CLARITIN) 10 MG tablet Take 1  tablet (10 mg total) by mouth daily. 30 tablet 6   meclizine (ANTIVERT) 25 MG tablet TAKE 1 TABLET(25 MG) BY MOUTH TWICE DAILY AS NEEDED FOR DIZZINESS 20 tablet 0   metroNIDAZOLE (FLAGYL) 500 MG tablet Take 1 tablet (500 mg total) by mouth 2 (two) times daily. 14 tablet 0   omeprazole (PRILOSEC) 20 MG capsule TAKE 1 CAPSULE(20 MG) BY MOUTH TWICE DAILY BEFORE A MEAL 60 capsule 0   oxycodone (OXY-IR) 5 MG capsule Take 5 mg by mouth every 8 (eight) hours as needed.     rosuvastatin (CRESTOR) 20 MG tablet Take 1 tablet (20 mg total) by mouth daily. 30 tablet 4   spironolactone (ALDACTONE) 25 MG tablet TAKE 1/2  TABLET(12.5 MG) BY MOUTH DAILY 34 tablet 3   No current facility-administered medications on file prior to visit.    Allergies  Allergen Reactions   Ampicillin Anaphylaxis   Atorvastatin Calcium Other (See Comments)    Pt reported lip swelling   Penicillins Anaphylaxis   Sulfonamide Derivatives Anaphylaxis   Hydrocodone-Acetaminophen Hives    Have to take a Benadryl to take med   Tramadol Itching   Tylenol [Acetaminophen] Itching    Social History   Socioeconomic History   Marital status: Single    Spouse name: Not on file   Number of children: 2   Years of education: 10   Highest education level: Not on file  Occupational History   Occupation: N/A  Tobacco Use   Smoking status: Never   Smokeless tobacco: Never  Substance and Sexual Activity   Alcohol use: Yes    Alcohol/week: 0.0 standard drinks    Comment: occa   Drug use: No   Sexual activity: Yes    Birth control/protection: Pill  Other Topics Concern   Not on file  Social History Narrative   Lives at home w/ her mother   Right-handed   Caffeine: 1 cup of tea per day   Social Determinants of Health   Financial Resource Strain: Not on file  Food Insecurity: Not on file  Transportation Needs: Not on file  Physical Activity: Not on file  Stress: Not on file  Social Connections: Not on file  Intimate Partner Violence: Not on file    Family History  Problem Relation Age of Onset   Diabetes Mother    Heart attack Father    Diabetes Brother    Diabetes Maternal Uncle    Diabetes Maternal Aunt    Seizures Sister    Colon cancer Neg Hx    Stomach cancer Neg Hx     Past Surgical History:  Procedure Laterality Date   ANAL FISTULECTOMY  08/22/2012   Procedure: FISTULECTOMY ANAL;  Surgeon: Lodema Pilot, DO;  Location: [facility];  Service: General;  Laterality: N/A;  rectal examination under anesthesia with seton placement    ANAL FISTULECTOMY  09/17/2012   Procedure: FISTULECTOMY ANAL;  Surgeon: Lodema Pilot, DO;  Location: [facility];  Service: General;  Laterality: N/A;  possible fistulotomy   D & C HYSTEROSCOPY/ NOVASURE ENDOMETRIAL ABLATION/ I & D THROMBOSED HEMORROID  01-10-2007  DR Isaias Cowman ROSS   EXAMINATION UNDER ANESTHESIA  09/17/2012   Procedure: EXAM UNDER ANESTHESIA;  Surgeon: Lodema Pilot, DO;  Location: [facility];  Service: General;  Laterality: N/A;  Rectal exam under anesthesia   EXAMINATION UNDER ANESTHESIA N/A 05/14/2013   Procedure: EXAM UNDER ANESTHESIA;  Surgeon: Romie Levee, MD;  Location: [facility];  Service:  General;  Laterality: N/A;   FISTULA PLUG  09/17/2012   Procedure: FISTULA PLUG;  Surgeon: Lodema Pilot, DO;  Location: [facility];  Service: General;  Laterality: N/A;  Possible fistula plug   HEMORRHOID SURGERY  04-28-2009   [facility] INTERNAL HEMORRHOIDS   IRRIGATION AND DEBRIDEMENT ABSCESS  07/08/2012   Procedure: MINOR INCISION AND DRAINAGE OF ABSCESS;  Surgeon: Geryl Rankins, MD;  Location: [facility];  Service: Gynecology;  Laterality: N/A;  Vulvar Abscess   PLACEMENT OF SETON N/A 05/14/2013   Procedure:  PLACEMENT OF SETON;  Surgeon: Romie Levee, MD;  Location: [facility];  Service: General;  Laterality: N/A;   RECTAL EXAM UNDER ANESTHESIA N/A 08/20/2013   Procedure: mucosal advancement flap ;  Surgeon: Romie Levee, MD;  Location: [facility];  Service: General;  Laterality: N/A;  parks anal retractor long rectal instrucments prone jack knife anal fistula    RECTAL ULTRASOUND N/A 07/20/2013   Procedure: RECTAL ULTRASOUND;  Surgeon: Romie Levee, MD;  Location: [facility];  Service: Endoscopy;  Laterality: N/A;    ROS: Review of Systems Negative except as stated above  PHYSICAL EXAM: BP 111/78   Pulse 100   Resp 16   Wt 156 lb 3.2 oz (70.9 kg)   SpO2 97%   BMI 26.81 kg/m   Physical Exam  General appearance - alert, well appearing, and in no distress Mental  status - normal mood, behavior, speech, dress, motor activity, and thought processes Abdomen -nondistended.  Normal bowel sounds.  Mild right lower quadrant abdominal tenderness without guarding or rebound.   CMP Latest Ref Rng & Units 02/06/2021 01/28/2020 04/01/2019  Glucose 65 - 99 mg/dL 858(I) 91 93  BUN 6 - 24 mg/dL 8 7 7   Creatinine 0.57 - 1.00 mg/dL 5.02 7.74  Sodium 134 - 144 mmol/L 142 144 138  Potassium 3.5 - 5.2 mmol/L 4.1 4.1 4.2  Chloride 96 - 106 mmol/L 101 103 104  CO2 20 - 29 mmol/L 26 27 25   Calcium 8.7 - 10.2 mg/dL 9.3 9.7 9.2  Total Protein 6.0 - 8.5 g/dL 6.8 7.2 -  Total Bilirubin 0.0 - 1.2 mg/dL 0.2 1.28 -  Alkaline Phos 44 - 121 IU/L 76 83 -  AST 0 - 40 IU/L 21 17 -  ALT 0 - 32 IU/L 20 8 -   Lipid Panel     Component Value Date/Time   CHOL 204 (H) 02/06/2021 1611   TRIG 125 02/06/2021 1611   HDL 61 02/06/2021 1611   CHOLHDL 3.3 02/06/2021 1611   CHOLHDL 3.8 03/19/2009 0414   VLDL 13 03/19/2009 0414   LDLCALC 121 (H) 02/06/2021 1611    CBC    Component Value Date/Time   WBC 6.2 03/21/2021 1156   WBC 5.5 04/01/2019 1054   RBC 4.63 03/21/2021 1156   RBC 4.34 04/01/2019 1054   HGB 13.8 03/21/2021 1156   HGB 13.8 09/05/2009 1347   HCT 42.0 03/21/2021 1156   HCT 41.3 09/05/2009 1347   PLT 291 03/21/2021 1156   MCV 91 03/21/2021 1156   MCV 92.0 09/05/2009 1347   MCH 29.8 03/21/2021 1156   MCH 29.5 04/01/2019 1054   MCHC 32.9 03/21/2021 1156   MCHC 32.3 04/01/2019 1054   RDW 12.8 03/21/2021 1156   RDW 13.0 09/05/2009 1347   LYMPHSABS 2.2 04/01/2019 1054   LYMPHSABS 2.9 03/16/2019 1427   LYMPHSABS 2.1 09/05/2009 1347   MONOABS 0.4 04/01/2019 1054   MONOABS 0.3 09/05/2009  1347   EOSABS 0.1 04/01/2019 1054   EOSABS 0.1 03/16/2019 1427   BASOSABS 0.1 04/01/2019 1054   BASOSABS 0.1 03/16/2019 1427   BASOSABS 0.1 09/05/2009 1347    ASSESSMENT AND PLAN: 1. Right lower quadrant abdominal pain DDX include appendix though I doubt it would have  persisted this long, other bowel vs pelvic pathology. Since the pain has persisted and is becoming more frequent, I think it is reasonable to do imaging study at this time to evaluate and pelvic area. - CT Abdomen Pelvis W Contrast; Future - Basic Metabolic Panel    Patient was given the opportunity to ask questions.  Patient verbalized understanding of the plan and was able to repeat key elements of the plan.   Orders Placed This Encounter  Procedures   CT Abdomen Pelvis W Contrast   Basic Metabolic Panel     Requested Prescriptions    No prescriptions requested or ordered in this encounter    No follow-ups on file.  Jonah Blueeborah Gurtha Picker, MD, FACP

## 2021-06-14 LAB — BASIC METABOLIC PANEL
BUN/Creatinine Ratio: 11 (ref 9–23)
BUN: 9 mg/dL (ref 6–24)
CO2: 24 mmol/L (ref 20–29)
Calcium: 10.4 mg/dL — ABNORMAL HIGH (ref 8.7–10.2)
Chloride: 102 mmol/L (ref 96–106)
Creatinine, Ser: 0.83 mg/dL (ref 0.57–1.00)
Glucose: 95 mg/dL (ref 65–99)
Potassium: 4.1 mmol/L (ref 3.5–5.2)
Sodium: 145 mmol/L — ABNORMAL HIGH (ref 134–144)
eGFR: 83 mL/min/{1.73_m2} (ref >59)

## 2021-06-16 DIAGNOSIS — M419 Scoliosis, unspecified: Secondary | ICD-10-CM | POA: Diagnosis not present

## 2021-06-16 DIAGNOSIS — M545 Low back pain, unspecified: Secondary | ICD-10-CM | POA: Diagnosis not present

## 2021-06-16 DIAGNOSIS — G629 Polyneuropathy, unspecified: Secondary | ICD-10-CM | POA: Diagnosis not present

## 2021-06-16 DIAGNOSIS — M5416 Radiculopathy, lumbar region: Secondary | ICD-10-CM | POA: Diagnosis not present

## 2021-06-21 ENCOUNTER — Other Ambulatory Visit: Payer: Self-pay | Admitting: Internal Medicine

## 2021-06-21 ENCOUNTER — Telehealth: Payer: Self-pay

## 2021-06-21 DIAGNOSIS — K5909 Other constipation: Secondary | ICD-10-CM

## 2021-06-21 MED ORDER — LINACLOTIDE 290 MCG PO CAPS: ORAL_CAPSULE | ORAL | 2 refills | Status: DC

## 2021-06-21 NOTE — Telephone Encounter (Signed)
Patient was called and a voicemail was left informing patient to return phone call for lab results.   CRM created. 

## 2021-06-21 NOTE — Telephone Encounter (Signed)
Copied from CRM [id number]. Topic: Quick Communication - Rx Refill/Question >> Jun 21, 2021  9:31 AM [name] A wrote: Medication: [name] 290 MCG CAPS capsule [[id number]]   Has the patient contacted their pharmacy? No. (Agent: If no, request that the patient contact the pharmacy for the refill.) (Agent: If yes, when and what did the pharmacy advise?)  Preferred Pharmacy (with phone number or street name): [id number] - [place], [place] - [address] AT [facility] [address]  Phone:  [phone] Fax:  [phone]   Agent: Please be advised that RX refills may take up to 3 business days. We ask that you follow-up with your pharmacy.

## 2021-06-21 NOTE — Telephone Encounter (Signed)
-----   Message from Marcine Matar, MD sent at 06/14/2021 10:11 AM EDT ----- Kidney function okay.  Sodium level mildly elevated. Recommend drinking several glasses of water daily to stay hydrated.

## 2021-06-22 ENCOUNTER — Other Ambulatory Visit: Payer: Self-pay

## 2021-06-22 ENCOUNTER — Ambulatory Visit (HOSPITAL_BASED_OUTPATIENT_CLINIC_OR_DEPARTMENT_OTHER)
Admission: RE | Admit: 2021-06-22 | Discharge: 2021-06-22 | Disposition: A | Discharge status: Home / Self Care | Payer: Medicare Other | Source: Ambulatory Visit | Attending: Internal Medicine | Admitting: Internal Medicine

## 2021-06-22 DIAGNOSIS — R1031 Right lower quadrant pain: Secondary | ICD-10-CM

## 2021-06-27 ENCOUNTER — Other Ambulatory Visit: Payer: Self-pay | Admitting: Internal Medicine

## 2021-06-27 DIAGNOSIS — K219 Gastro-esophageal reflux disease without esophagitis: Secondary | ICD-10-CM

## 2021-06-27 NOTE — Telephone Encounter (Signed)
Requested medications are due for refill today yes  Requested medications are on the active medication list yes  Last refill 7/24  Last visit 8/9, abd pain, GERD not addressed  Future visit scheduled no  Notes to clinic Has already had a curtesy refill and there is no upcoming appointment scheduled, was seen but not for GERD, please assess.

## 2021-06-28 ENCOUNTER — Other Ambulatory Visit: Payer: Self-pay | Admitting: Internal Medicine

## 2021-06-28 DIAGNOSIS — K219 Gastro-esophageal reflux disease without esophagitis: Secondary | ICD-10-CM

## 2021-06-28 NOTE — Telephone Encounter (Signed)
Requested medication (s) are due for refill today: signed today   Requested medication (s) are on the active medication list: yes  Last refill:  06/28/21 #60 0 refills  Future visit scheduled: no last seen 2 weeks ago   Notes to clinic:  signed today for 1 month supply. Patient requesting 90 day supply     Requested Prescriptions  Pending Prescriptions Disp Refills   omeprazole (PRILOSEC) 20 MG capsule [Pharmacy Med Name: OMEPRAZOLE 20MG  CAPSULES] 180 capsule     Sig: TAKE 1 CAPSULE(20 MG) BY MOUTH TWICE DAILY BEFORE A MEAL     Gastroenterology: Proton Pump Inhibitors Passed - 06/28/2021 12:16 PM      Passed - Valid encounter within last 12 months    Recent Outpatient Visits           2 weeks ago Right lower quadrant abdominal pain   [facility] [facility], MD   3 months ago Right lower quadrant abdominal pain   [facility], MD   4 months ago Essential hypertension   [facility], MD   6 months ago Dermatitis   [facility], MD   8 months ago Essential hypertension   [facility], MD

## 2021-06-29 ENCOUNTER — Other Ambulatory Visit: Payer: Self-pay

## 2021-06-29 ENCOUNTER — Encounter (HOSPITAL_BASED_OUTPATIENT_CLINIC_OR_DEPARTMENT_OTHER): Payer: Self-pay

## 2021-06-29 ENCOUNTER — Ambulatory Visit (HOSPITAL_BASED_OUTPATIENT_CLINIC_OR_DEPARTMENT_OTHER)
Admission: RE | Admit: 2021-06-29 | Discharge: 2021-06-29 | Disposition: A | Discharge status: Home / Self Care | Payer: Medicare Other | Source: Ambulatory Visit | Attending: Internal Medicine | Admitting: Internal Medicine

## 2021-06-29 ENCOUNTER — Telehealth: Payer: Self-pay | Admitting: *Deleted

## 2021-06-29 DIAGNOSIS — R1031 Right lower quadrant pain: Secondary | ICD-10-CM | POA: Diagnosis not present

## 2021-06-29 DIAGNOSIS — R109 Unspecified abdominal pain: Secondary | ICD-10-CM | POA: Diagnosis not present

## 2021-06-29 MED ORDER — IOHEXOL 350 MG/ML SOLN
60.0000 mL | Freq: Once | INTRAVENOUS | Status: AC | PRN
  Administered 2021-06-29: 60 mL via INTRAVENOUS

## 2021-06-29 NOTE — Telephone Encounter (Signed)
Pt returned call for lab results, reviewed again, pt remembered she had spoken with someone earlier and had received results.

## 2021-07-03 ENCOUNTER — Telehealth: Payer: Self-pay

## 2021-07-03 DIAGNOSIS — R1031 Right lower quadrant pain: Secondary | ICD-10-CM

## 2021-07-03 NOTE — Telephone Encounter (Signed)
Contacted pt to go over CT results pt is aware and doesn't have any questions or concerns  Pt states she is still having the pain. Please advise

## 2021-07-04 NOTE — Telephone Encounter (Signed)
Returned pt call and made aware of referral being made pt doesn't have any questions or concerns

## 2021-07-08 NOTE — Progress Notes (Signed)
Subjective:   [name] is a 55 y.o. female who presents for Medicare Annual (Subsequent) preventive examination.  I connected with  Diane Arenasoie J Prashad on 07/09/21 by a audio enabled telemedicine application and verified that I am speaking with the correct person using two identifiers.   Location of patient: Home Location of provider: Office  Persons participating in visit Diane Arenasoie J Cespedes Self (Patient) Erick AlleyKhamika Jettie Lazare Ripley Fraise/RMA  I discussed the limitations of evaluation and management by telemedicine. The patient expressed understanding and agreed to proceed.  Review of Systems     Defer to PCP       Objective:    Today's Vitals   07/09/21 1208  PainSc: 5    There is no height or weight on file to calculate BMI.  Advanced Directives 06/27/2020 04/01/2019 12/10/2018 08/06/2017 03/20/2017 12/12/2016 10/10/2016  Does Patient Have a Medical Advance Directive? No No No No No No No  Would patient like information on creating a medical advance directive? No - Patient declined No - Patient declined No - Patient declined Yes (MAU/Ambulatory/Procedural Areas - Information given) No - Patient declined - -  Pre-existing out of facility DNR order (yellow form or pink MOST form) - - - - - - -    Current Medications (verified) Outpatient Encounter Medications as of 07/09/2021  Medication Sig   albuterol (VENTOLIN HFA) 108 (90 Base) MCG/ACT inhaler Inhale 2 puffs into the lungs every 6 (six) hours as needed for wheezing or shortness of breath.   amLODipine (NORVASC) 10 MG tablet TAKE 1 TABLET(10 MG) BY MOUTH DAILY   benzonatate (TESSALON) 100 MG capsule TAKE 1 CAPSULE(100 MG) BY MOUTH TWICE DAILY AS NEEDED FOR COUGH   Blood Pressure Monitor DEVI Use as directed to check home blood pressure 2-3 times a week   diclofenac sodium (VOLTAREN) 1 % GEL Apply 2 g topically 4 (four) times daily.   diphenhydrAMINE (BENADRYL) 25 MG tablet Take 1 tablet (25 mg total) by mouth every 6 (six) hours as needed.   DULoxetine  (CYMBALTA) 30 MG capsule Take 1 capsule by mouth at bedtime.   EPINEPHrine (EPIPEN 2-PAK) 0.3 mg/0.3 mL IJ SOAJ injection Inject 0.3 mLs (0.3 mg total) into the muscle as needed for anaphylaxis.   fluticasone (FLONASE) 50 MCG/ACT nasal spray Place 1 spray into both nostrils daily.   gabapentin (NEURONTIN) 300 MG capsule Take 1 capsule (300 mg total) by mouth at bedtime.   linaclotide (LINZESS) 290 MCG CAPS capsule TAKE 1 CAPSULE(290 MCG) BY MOUTH DAILY BEFORE BREAKFAST   loratadine (CLARITIN) 10 MG tablet Take 1 tablet (10 mg total) by mouth daily.   meclizine (ANTIVERT) 25 MG tablet TAKE 1 TABLET(25 MG) BY MOUTH TWICE DAILY AS NEEDED FOR DIZZINESS   omeprazole (PRILOSEC) 20 MG capsule TAKE 1 CAPSULE(20 MG) BY MOUTH TWICE DAILY BEFORE A MEAL   oxycodone (OXY-IR) 5 MG capsule Take 5 mg by mouth every 8 (eight) hours as needed.   rosuvastatin (CRESTOR) 20 MG tablet Take 1 tablet (20 mg total) by mouth daily.   spironolactone (ALDACTONE) 25 MG tablet TAKE 1/2 TABLET(12.5 MG) BY MOUTH DAILY   [DISCONTINUED] metroNIDAZOLE (FLAGYL) 500 MG tablet Take 1 tablet (500 mg total) by mouth 2 (two) times daily.   No facility-administered encounter medications on file as of 07/09/2021.    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 Claritin   Vertigo    Past Surgical History:  Procedure Laterality Date   ANAL FISTULECTOMY  08/22/2012   Procedure: FISTULECTOMY ANAL;  Surgeon: Lodema Pilot, DO;  Location: [facility];  Service: General;  Laterality: N/A;  rectal examination under anesthesia with seton placement    ANAL FISTULECTOMY  09/17/2012   Procedure:  FISTULECTOMY ANAL;  Surgeon: Lodema Pilot, DO;  Location: [facility];  Service: General;  Laterality: N/A;  possible fistulotomy   D & C HYSTEROSCOPY/ NOVASURE ENDOMETRIAL ABLATION/ I & D THROMBOSED HEMORROID  01-10-2007  DR Isaias Cowman ROSS   EXAMINATION UNDER ANESTHESIA  09/17/2012   Procedure: EXAM UNDER ANESTHESIA;  Surgeon: Lodema Pilot, DO;  Location: [facility];  Service: General;  Laterality: N/A;  Rectal exam under anesthesia   EXAMINATION UNDER ANESTHESIA N/A 05/14/2013   Procedure: EXAM UNDER ANESTHESIA;  Surgeon: Romie Levee, MD;  Location[facility];  Service: General;  Laterality: N/A;   FISTULA PLUG  09/17/2012   Procedure: FISTULA PLUG;  Surgeon: Lodema Pilot, DO;  Location: [facility];  Service: General;  Laterality: N/A;  Possible fistula plug   HEMORRHOID SURGERY  04-28-2009   [facility] INTERNAL HEMORRHOIDS   IRRIGATION AND DEBRIDEMENT ABSCESS  07/08/2012   Procedure: MINOR INCISION AND DRAINAGE OF ABSCESS;  Surgeon: Geryl Rankins, MD;  Location: [facility];  Service: Gynecology;  Laterality: N/A;  Vulvar Abscess   PLACEMENT OF SETON N/A 05/14/2013   Procedure:  PLACEMENT OF SETON;  Surgeon: Romie Levee, MD;  Location: [facility];  Service: General;  Laterality: N/A;   RECTAL EXAM UNDER ANESTHESIA N/A 08/20/2013   Procedure: mucosal advancement flap ;  Surgeon: Romie Levee, MD;  Location: [facility];  Service: General;  Laterality: N/A;  parks anal retractor long rectal instrucments prone jack knife anal fistula    RECTAL ULTRASOUND N/A 07/20/2013   Procedure: RECTAL ULTRASOUND;  Surgeon: Romie Levee, MD;  Location: [facility];  Service: Endoscopy;  Laterality: N/A;   Family History  Problem Relation Age of Onset   Diabetes Mother    Heart attack Father    Diabetes Brother    Diabetes Maternal Uncle    Diabetes Maternal Aunt    Seizures Sister    Colon cancer Neg Hx    Stomach cancer Neg  Hx    Social History   Socioeconomic History   Marital status: Single    Spouse name: Not on file   Number of children: 2   Years of education: 10   Highest education level: Not on file  Occupational History   Occupation: N/A  Tobacco Use   Smoking status: Never   Smokeless tobacco: Never  Substance and Sexual Activity   Alcohol use: Yes    Alcohol/week: 0.0 standard drinks    Comment: occa   Drug use: No   Sexual activity: Yes    Birth control/protection: Pill  Other Topics Concern   Not on file  Social History Narrative   Lives at home w/ her mother   Right-handed   Caffeine: 1 cup of tea per day   Social Determinants of Health   Financial Resource Strain: Not on file  Food Insecurity: Not on file  Transportation Needs: Not on file  Physical Activity: Not on file  Stress:  Not on file  Social Connections: Not on file    Tobacco Counseling Counseling given: Not Answered   Clinical Intake:     Pain : 0-10 Pain Score: 5  Pain Orientation: Right Pain Descriptors / Indicators: Discomfort Pain Onset: Today Pain Frequency: Occasional        How often do you need to have someone help you when you read instructions, pamphlets, or other written materials from your doctor or pharmacy?: 1 - Never  Diabetic? No  Interpreter Needed?: No      Activities of Daily Living In your present state of health, do you have any difficulty performing the following activities: 07/09/2021  Hearing? N  Vision? N  Difficulty concentrating or making decisions? N  Walking or climbing stairs? N  Dressing or bathing? N  Doing errands, shopping? N  Some recent data might be hidden    Patient Care Team: Marcine Matar, MD as PCP - General (Internal Medicine) Lunette Stands, MD as Consulting Physician (Orthopedic Surgery)  Indicate any recent Medical Services you may have received from other than [facility] providers in the past year (date may be approximate).      Assessment:   This is a routine wellness examination for Tifanny.  Hearing/Vision screen No results found.  Dietary issues and exercise activities discussed: Current Exercise Habits: The patient does not participate in regular exercise at present, Exercise limited by: None identified   Goals Addressed   None   Depression Screen PHQ 2/9 Scores 07/09/2021 06/13/2021 03/21/2021 12/12/2020 10/04/2020 06/27/2020 01/28/2020  PHQ - 2 Score 0 0 0 0 0 0 0  PHQ- 9 Score 3 - - 0 - 1 -    Fall Risk Fall Risk  07/09/2021 06/13/2021 03/21/2021 02/02/2021 12/12/2020  Falls in the past year? 0 0 0 0 0  Number falls in past yr: 0 0 0 0 0  Injury with Fall? 0 0 0 0 0  Risk for fall due to : No Fall Risks No Fall Risks No Fall Risks - -  Follow up Falls prevention discussed - - - -    FALL RISK PREVENTION PERTAINING TO THE HOME:  Any stairs in or around the home? No  If so, are there any without handrails? No  Home free of loose throw rugs in walkways, pet beds, electrical cords, etc? Yes  Adequate lighting in your home to reduce risk of falls? Yes   ASSISTIVE DEVICES UTILIZED TO PREVENT FALLS:  Life alert? Yes  Use of a cane, walker or w/c? No  Grab bars in the bathroom? No  Shower chair or bench in shower? No  Elevated toilet seat or a handicapped toilet? No   TIMED UP AND GO:  Was the test performed?  N/A .  Length of time to ambulate 10 feet: N/A sec.   Gait steady and fast without use of assistive device  Cognitive Function: MMSE - Mini Mental State Exam 06/27/2020  Orientation to time 5  Orientation to Place 5  Registration 3  Attention/ Calculation 4  Recall 2  Language- name 2 objects 2  Language- repeat 1  Language- follow 3 step command 3  Language- read & follow direction 1  Write a sentence 1  Copy design 1  Total score 28     6CIT Screen 07/09/2021  What Year? 0 points  What month? 0 points  What time? 0 points  Count back from 20 0 points  Months in reverse 0 points   Repeat phrase  0 points  Total Score 0    Immunizations Immunization History  Administered Date(s) Administered   Influenza Whole 08/10/2009   Influenza,inj,Quad PF,6+ Mos 11/21/2017, 06/27/2020   Influenza-Unspecified 08/05/2016   Moderna Sars-Covid-2 Vaccination 01/22/2020, 02/18/2020, 08/30/2020   Tdap 10/13/2018    TDAP status: Up to date  Flu Vaccine status: Up to date  Pneumococcal vaccine status: Up to date  Covid-19 vaccine status: Information provided on how to obtain vaccines.   Qualifies for Shingles Vaccine? Yes   Zostavax completed Yes   Shingrix Completed?: No.    Education has been provided regarding the importance of this vaccine. Patient has been advised to call insurance company to determine out of pocket expense if they have not yet received this vaccine. Advised may also receive vaccine at local pharmacy or Health Dept. Verbalized acceptance and understanding.  Screening Tests Health Maintenance  Topic Date Due   Zoster Vaccines- Shingrix (1 of 2) Never done   MAMMOGRAM  11/10/2020   COVID-19 Vaccine (4 - Booster for Moderna series) 12/31/2020   INFLUENZA VACCINE  06/05/2021   PAP SMEAR-Modifier  03/21/2024   COLONOSCOPY (Pts 45-17yrs Insurance coverage will need to be confirmed)  03/17/2025   TETANUS/TDAP  10/13/2028   Hepatitis C Screening  Completed   HIV Screening  Completed   Pneumococcal Vaccine 68-81 Years old  Aged Out   HPV VACCINES  Aged Out    Health Maintenance  Health Maintenance Due  Topic Date Due   Zoster Vaccines- Shingrix (1 of 2) Never done   MAMMOGRAM  11/10/2020   COVID-19 Vaccine (4 - Booster for Moderna series) 12/31/2020   INFLUENZA VACCINE  06/05/2021    Colorectal cancer screening: Type of screening: Colonoscopy. Completed 03/18/2015. Repeat every 10 years  Mammogram status: Ordered  . Pt provided with contact info and advised to call to schedule appt.   Bone Density status: Ordered  . Pt provided with contact info  and advised to call to schedule appt.  Lung Cancer Screening: (Low Dose CT Chest recommended if Age 57-80 years, 30 pack-year currently smoking OR have quit w/in 15years.) does not qualify.   Lung Cancer Screening Referral: never Smoker  Additional Screening:  Hepatitis C Screening: does qualify; Completed 11/24/2014  Vision Screening: Recommended annual ophthalmology exams for early detection of glaucoma and other disorders of the eye. Is the patient up to date with their annual eye exam?   Patient given education on importance of eye exams. Who is the provider or what is the name of the office in which the patient attends annual eye exams? Patient will schedule appt for yearly eye exam. If pt is not established with a provider, would they like to be referred to a provider to establish care? No .   Dental Screening: Recommended annual dental exams for proper oral hygiene  Community Resource Referral / Chronic Care Management: CRR required this visit?  No   CCM required this visit?  No      Plan:     I have personally reviewed and noted the following in the patient's chart:   Medical and social history Use of alcohol, tobacco or illicit drugs  Current medications and supplements including opioid prescriptions.  Functional ability and status Nutritional status Physical activity Advanced directives List of other physicians Hospitalizations, surgeries, and ER visits in previous 12 months Vitals Screenings to include cognitive, depression, and falls Referrals and appointments  In addition, I have reviewed and discussed with patient certain preventive protocols, quality metrics,  and best practice recommendations. A written personalized care plan for preventive services as well as general preventive health recommendations were provided to patient.     Erick Alley, [facility]   07/09/2021   Nurse Notes: Non Face to Face 40 minutes  Ms. Height , Thank you for taking time to come  for your Medicare Wellness Visit. I appreciate your ongoing commitment to your health goals. Please review the following plan we discussed and let me know if I can assist you in the future.   These are the goals we discussed:  Goals   None     This is a list of the screening recommended for you and due dates:  Health Maintenance  Topic Date Due   Zoster (Shingles) Vaccine (1 of 2) Never done   Mammogram  11/10/2020   COVID-19 Vaccine (4 - Booster for Moderna series) 12/31/2020   Flu Shot  06/05/2021   Pap Smear  03/21/2024   Colon Cancer Screening  03/17/2025   Tetanus Vaccine  10/13/2028   Hepatitis C Screening: USPSTF Recommendation to screen - Ages 18-79 yo.  Completed   HIV Screening  Completed   Pneumococcal Vaccination  Aged Out   HPV Vaccine  Aged Out

## 2021-07-08 NOTE — Patient Instructions (Signed)
Health Maintenance, Female Adopting a healthy lifestyle and getting preventive care are important in promoting health and wellness. Ask your health care provider about: The right schedule for you to have regular tests and exams. Things you can do on your own to prevent diseases and keep yourself healthy. What should I know about diet, weight, and exercise? Eat a healthy diet  Eat a diet that includes plenty of vegetables, fruits, low-fat dairy products, and lean protein. Do not eat a lot of foods that are high in solid fats, added sugars, or sodium. Maintain a healthy weight Body mass index (BMI) is used to identify weight problems. It estimates body fat based on height and weight. Your health care provider can help determine your BMI and help you achieve or maintain a healthy weight. Get regular exercise Get regular exercise. This is one of the most important things you can do for your health. Most adults should: Exercise for at least 150 minutes each week. The exercise should increase your heart rate and make you sweat (moderate-intensity exercise). Do strengthening exercises at least twice a week. This is in addition to the moderate-intensity exercise. Spend less time sitting. Even light physical activity can be beneficial. Watch cholesterol and blood lipids Have your blood tested for lipids and cholesterol at 57 years of age, then have this test every 5 years. Have your cholesterol levels checked more often if: Your lipid or cholesterol levels are high. You are older than 57 years of age. You are at high risk for heart disease. What should I know about cancer screening? Depending on your health history and family history, you may need to have cancer screening at various ages. This may include screening for: Breast cancer. Cervical cancer. Colorectal cancer. Skin cancer. Lung cancer. What should I know about heart disease, diabetes, and high blood pressure? Blood pressure and heart  disease High blood pressure causes heart disease and increases the risk of stroke. This is more likely to develop in people who have high blood pressure readings, are of African descent, or are overweight. Have your blood pressure checked: Every 3-5 years if you are 18-39 years of age. Every year if you are 40 years old or older. Diabetes Have regular diabetes screenings. This checks your fasting blood sugar level. Have the screening done: Once every three years after age 40 if you are at a normal weight and have a low risk for diabetes. More often and at a younger age if you are overweight or have a high risk for diabetes. What should I know about preventing infection? Hepatitis B If you have a higher risk for hepatitis B, you should be screened for this virus. Talk with your health care provider to find out if you are at risk for hepatitis B infection. Hepatitis C Testing is recommended for: Everyone born from 1945 through 1965. Anyone with known risk factors for hepatitis C. Sexually transmitted infections (STIs) Get screened for STIs, including gonorrhea and chlamydia, if: You are sexually active and are younger than 57 years of age. You are older than 57 years of age and your health care provider tells you that you are at risk for this type of infection. Your sexual activity has changed since you were last screened, and you are at increased risk for chlamydia or gonorrhea. Ask your health care provider if you are at risk. Ask your health care provider about whether you are at high risk for HIV. Your health care provider may recommend a prescription medicine   to help prevent HIV infection. If you choose to take medicine to prevent HIV, you should first get tested for HIV. You should then be tested every 3 months for as long as you are taking the medicine. Pregnancy If you are about to stop having your period (premenopausal) and you may become pregnant, seek counseling before you get  pregnant. Take 400 to 800 micrograms (mcg) of folic acid every day if you become pregnant. Ask for birth control (contraception) if you want to prevent pregnancy. Osteoporosis and menopause Osteoporosis is a disease in which the bones lose minerals and strength with aging. This can result in bone fractures. If you are 65 years old or older, or if you are at risk for osteoporosis and fractures, ask your health care provider if you should: Be screened for bone loss. Take a calcium or vitamin D supplement to lower your risk of fractures. Be given hormone replacement therapy (HRT) to treat symptoms of menopause. Follow these instructions at home: Lifestyle Do not use any products that contain nicotine or tobacco, such as cigarettes, e-cigarettes, and chewing tobacco. If you need help quitting, ask your health care provider. Do not use street drugs. Do not share needles. Ask your health care provider for help if you need support or information about quitting drugs. Alcohol use Do not drink alcohol if: Your health care provider tells you not to drink. You are pregnant, may be pregnant, or are planning to become pregnant. If you drink alcohol: Limit how much you use to 0-1 drink a day. Limit intake if you are breastfeeding. Be aware of how much alcohol is in your drink. In the [place] one drink equals one 12 oz bottle of beer (355 mL), one 5 oz glass of wine (148 mL), or one 1 oz glass of hard liquor (44 mL). General instructions Schedule regular health, dental, and eye exams. Stay current with your vaccines. Tell your health care provider if: You often feel depressed. You have ever been abused or do not feel safe at home. Summary Adopting a healthy lifestyle and getting preventive care are important in promoting health and wellness. Follow your health care provider's instructions about healthy diet, exercising, and getting tested or screened for diseases. Follow your health care provider's  instructions on monitoring your cholesterol and blood pressure. This information is not intended to replace advice given to you by your health care provider. Make sure you discuss any questions you have with your health care provider. Document Revised: 12/30/2020 Document Reviewed: 10/15/2018 Elsevier Patient Education  2022 Elsevier Inc.  

## 2021-07-09 ENCOUNTER — Ambulatory Visit: Payer: Medicare Other

## 2021-07-09 ENCOUNTER — Ambulatory Visit (HOSPITAL_BASED_OUTPATIENT_CLINIC_OR_DEPARTMENT_OTHER): Payer: Medicare Other

## 2021-07-09 ENCOUNTER — Other Ambulatory Visit: Payer: Self-pay

## 2021-07-09 DIAGNOSIS — Z0000 Encounter for general adult medical examination without abnormal findings: Secondary | ICD-10-CM

## 2021-07-09 DIAGNOSIS — R0982 Postnasal drip: Secondary | ICD-10-CM

## 2021-07-09 NOTE — Telephone Encounter (Signed)
Patient requesting Diclofenac Sodium 1% Gel

## 2021-07-10 MED ORDER — BENZONATATE 100 MG PO CAPS: ORAL_CAPSULE | ORAL | 1 refills | Status: DC

## 2021-07-10 MED ORDER — MECLIZINE HCL 25 MG PO TABS: ORAL_TABLET | ORAL | 0 refills | Status: DC

## 2021-07-12 ENCOUNTER — Ambulatory Visit
Admission: RE | Admit: 2021-07-12 | Discharge: 2021-07-12 | Disposition: A | Discharge status: Home / Self Care | Payer: Medicare Other | Source: Ambulatory Visit | Attending: Internal Medicine | Admitting: Internal Medicine

## 2021-07-12 ENCOUNTER — Other Ambulatory Visit: Payer: Self-pay

## 2021-07-12 DIAGNOSIS — Z1231 Encounter for screening mammogram for malignant neoplasm of breast: Secondary | ICD-10-CM

## 2021-07-18 ENCOUNTER — Telehealth: Payer: Self-pay

## 2021-07-18 NOTE — Telephone Encounter (Signed)
Contacted pt to go over lab results pt is aware of results 

## 2021-07-28 ENCOUNTER — Other Ambulatory Visit: Payer: Self-pay | Admitting: Internal Medicine

## 2021-07-28 DIAGNOSIS — I10 Essential (primary) hypertension: Secondary | ICD-10-CM

## 2021-08-22 ENCOUNTER — Ambulatory Visit (INDEPENDENT_AMBULATORY_CARE_PROVIDER_SITE_OTHER): Payer: Medicare Other | Admitting: Internal Medicine

## 2021-08-22 ENCOUNTER — Encounter: Payer: Self-pay | Admitting: Internal Medicine

## 2021-08-22 VITALS — BP 116/74 | HR 104 | Ht 64.0 in | Wt 159.5 lb

## 2021-08-22 DIAGNOSIS — K5900 Constipation, unspecified: Secondary | ICD-10-CM | POA: Diagnosis not present

## 2021-08-22 DIAGNOSIS — G8929 Other chronic pain: Secondary | ICD-10-CM

## 2021-08-22 DIAGNOSIS — K219 Gastro-esophageal reflux disease without esophagitis: Secondary | ICD-10-CM | POA: Diagnosis not present

## 2021-08-22 DIAGNOSIS — R1031 Right lower quadrant pain: Secondary | ICD-10-CM

## 2021-08-22 DIAGNOSIS — K8020 Calculus of gallbladder without cholecystitis without obstruction: Secondary | ICD-10-CM | POA: Diagnosis not present

## 2021-08-22 NOTE — Patient Instructions (Signed)
Start Miralax as recommended.   Follow up as needed.   If you are age 57 or older, your body mass index should be between 23-30. Your Body mass index is 27.38 kg/m. If this is out of the aforementioned range listed, please consider follow up with your Primary Care Provider.  If you are age 44 or younger, your body mass index should be between 19-25. Your Body mass index is 27.38 kg/m. If this is out of the aformentioned range listed, please consider follow up with your Primary Care Provider.   ________________________________________________________  The [facility] providers would like to encourage you to use [facility] to communicate with providers for non-urgent requests or questions.  Due to long hold times on the telephone, sending your provider a message by [facility] may be a faster and more efficient way to get a response.  Please allow 48 business hours for a response.  Please remember that this is for non-urgent requests.  _______________________________________________________

## 2021-08-22 NOTE — Progress Notes (Signed)
HISTORY OF PRESENT ILLNESS:  [name] is a pleasant 55 y.o. female with past medical history as listed below who presents today regarding problems with constipation and new onset recurrent right lower quadrant pain.  She was last evaluated in this office April 17, 2017 regarding chronic constipation, abdominal bloating, and right lower quadrant abdominal discomfort.  See that dictation for details.  For her chronic constipation she has been taking Linzess 290 mcg daily.  She states this helps her constipation, though she only has a bowel movement about 3 or 4 times per week.  In terms of the right lower quadrant discomfort, this has been going on for several months.  She describes an episode about once every month.  This seems to be associated with worsening periods of constipation.  She was felt to have constipation related abdominal pain in the past.  She is noted to have cholelithiasis.  For this abdominal pain she did undergo a contrast-enhanced CT scan of the abdomen and pelvis on June 22, 2021.  No explanation for the patient's right lower quadrant pain was found.  Review of blood work from May 2022 shows normal hemoglobin of 13.8.  Previous liver tests have been normal.  She has undergone previous colonoscopy and upper endoscopy to evaluate abdominal pain.  These examinations were performed May 2016.  Upper endoscopy was normal save incidental esophageal ring.  Colonoscopy, including intubation of the terminal ileum, was normal.  Patient does have chronic back problems for which she occasionally takes hydrocodone.  She tells me that her gynecology checkups are up-to-date.  REVIEW OF SYSTEMS:  All non-GI ROS negative unless otherwise stated in the HPI except for sinus allergy trouble, arthritis, back pain, headaches, menstrual cramps, hearing problems, cough, night sweats, shortness of breath, lower extremity swelling  Past Medical History:  Diagnosis Date   Anal fistula 09/2012   Anemia     Constipation    GERD (gastroesophageal reflux disease)    Hemorrhoids    HTN (hypertension)    Hyperlipidemia    Seasonal allergies 08-13-13   tx. Claritin   Vertigo     Past Surgical History:  Procedure Laterality Date   ANAL FISTULECTOMY  08/22/2012   Procedure: FISTULECTOMY ANAL;  Surgeon: Lodema Pilot, DO;  Location: [facility];  Service: General;  Laterality: N/A;  rectal examination under anesthesia with seton placement    ANAL FISTULECTOMY  09/17/2012   Procedure: FISTULECTOMY ANAL;  Surgeon: Lodema Pilot, DO;  Location: [facility];  Service: General;  Laterality: N/A;  possible fistulotomy   D & C HYSTEROSCOPY/ NOVASURE ENDOMETRIAL ABLATION/ I & D THROMBOSED HEMORROID  01-10-2007  DR Isaias Cowman ROSS   EXAMINATION UNDER ANESTHESIA  09/17/2012   Procedure: EXAM UNDER ANESTHESIA;  Surgeon: Lodema Pilot, DO;  Location: [facility];  Service: General;  Laterality: N/A;  Rectal exam under anesthesia   EXAMINATION UNDER ANESTHESIA N/A 05/14/2013   Procedure: EXAM UNDER ANESTHESIA;  Surgeon: Romie Levee, MD;  Location: [facility];  Service: General;  Laterality: N/A;   FISTULA PLUG  09/17/2012   Procedure: FISTULA PLUG;  Surgeon: Lodema Pilot, DO;  Location: [facility];  Service: General;  Laterality: N/A;  Possible fistula plug   HEMORRHOID SURGERY  04-28-2009   [facility] INTERNAL HEMORRHOIDS   IRRIGATION AND DEBRIDEMENT ABSCESS  07/08/2012   Procedure: MINOR INCISION AND DRAINAGE OF ABSCESS;  Surgeon: Geryl Rankins, MD;  Location: [facility];  Service: Gynecology;  Laterality: N/A;  Vulvar Abscess   PLACEMENT OF SETON N/A 05/14/2013   Procedure:  PLACEMENT OF SETON;  Surgeon: Romie Levee, MD;  Location: [facility];  Service: General;  Laterality: N/A;   RECTAL EXAM UNDER ANESTHESIA N/A 08/20/2013   Procedure: mucosal advancement flap ;  Surgeon: Romie Levee, MD;  Location: [facility];  Service: General;   Laterality: N/A;  parks anal retractor long rectal instrucments prone jack knife anal fistula    RECTAL ULTRASOUND N/A 07/20/2013   Procedure: RECTAL ULTRASOUND;  Surgeon: Romie Levee, MD;  Location: [facility];  Service: Endoscopy;  Laterality: N/A;    Social History [name]  reports that she has never smoked. She has never used smokeless tobacco. She reports that she does not drink alcohol and does not use drugs.  family history includes Diabetes in her brother, maternal aunt, maternal uncle, and mother; Heart attack in her father; Hypertension in her brother, maternal uncle, mother, and son; Seizures in her sister and sister.  Allergies  Allergen Reactions   Ampicillin Anaphylaxis   Atorvastatin Calcium Other (See Comments)    Pt reported lip swelling   Penicillins Anaphylaxis   Sulfonamide Derivatives Anaphylaxis   Hydrocodone-Acetaminophen Hives    Have to take a Benadryl to take med   Tramadol Itching   Tylenol [Acetaminophen] Itching       PHYSICAL EXAMINATION: Vital signs: BP 116/74 (BP Location: Left Arm, Patient Position: Sitting, Cuff Size: Normal)   Pulse (!) 104   Ht 5\' 4"  (1.626 m) Comment: height measured without shoes  Wt 159 lb 8 oz (72.3 kg)   BMI 27.38 kg/m   Constitutional: generally well-appearing, no acute distress Psychiatric: alert and oriented x3, cooperative Eyes: extraocular movements intact, anicteric, conjunctiva pink Mouth: oral pharynx moist, no lesions Neck: supple no lymphadenopathy Cardiovascular: heart regular rate and rhythm, no murmur Lungs: clear to auscultation bilaterally Abdomen: soft, nontender, nondistended, no obvious ascites, no peritoneal signs, normal bowel sounds, no organomegaly Rectal: Omitted Extremities: no clubbing, cyanosis, or lower extremity edema bilaterally Skin: no lesions on visible extremities Neuro: No focal deficits.  Cranial nerves intact  ASSESSMENT:  1.  Functional constipation 2.  Normal  colonoscopy 2016 3.  Right lower quadrant pain.  Likely related to constipation.  Possibly related to chronic back issues with a referred component.  Negative recent CT to evaluate the same. 4.  Cholelithiasis.  Still felt to be incidental 4.  GERD  PLAN:  1.  Continue Linzess 2.  Add MiraLAX.  Start with 1 scoop daily.  Titrate to achieve daily bowel movements for desired effect. 3.  Routine repeat screening colonoscopy around 2026 4.  Interval follow-up as needed

## 2021-10-12 ENCOUNTER — Other Ambulatory Visit: Payer: Self-pay

## 2021-10-12 ENCOUNTER — Encounter: Payer: Self-pay | Admitting: Internal Medicine

## 2021-10-12 ENCOUNTER — Ambulatory Visit: Payer: Medicare Other | Attending: Internal Medicine | Admitting: Internal Medicine

## 2021-10-12 VITALS — BP 109/76 | HR 95 | Resp 16 | Wt 160.6 lb

## 2021-10-12 DIAGNOSIS — G8929 Other chronic pain: Secondary | ICD-10-CM

## 2021-10-12 DIAGNOSIS — E782 Mixed hyperlipidemia: Secondary | ICD-10-CM

## 2021-10-12 DIAGNOSIS — R0982 Postnasal drip: Secondary | ICD-10-CM | POA: Diagnosis not present

## 2021-10-12 DIAGNOSIS — I10 Essential (primary) hypertension: Secondary | ICD-10-CM

## 2021-10-12 DIAGNOSIS — R053 Chronic cough: Secondary | ICD-10-CM

## 2021-10-12 DIAGNOSIS — M5416 Radiculopathy, lumbar region: Secondary | ICD-10-CM | POA: Diagnosis not present

## 2021-10-12 MED ORDER — BENZONATATE 100 MG PO CAPS: ORAL_CAPSULE | ORAL | 1 refills | Status: DC

## 2021-10-12 MED ORDER — AMLODIPINE BESYLATE 10 MG PO TABS: 10.0000 mg | ORAL_TABLET | Freq: Every day | ORAL | 1 refills | Status: DC

## 2021-10-12 MED ORDER — KETOROLAC TROMETHAMINE 60 MG/2ML IM SOLN
60.0000 mg | Freq: Once | INTRAMUSCULAR | Status: AC
  Administered 2021-10-12: 60 mg via INTRAMUSCULAR

## 2021-10-12 NOTE — Progress Notes (Signed)
Patient ID: Avie Arenasoie J Yeargan, female    DOB: 08-Feb-1966  MRN: [id number]  CC: Hypertension, Medication Refill, and Cough   Subjective: [name] is a 55 y.o. female who presents for chronic ds management Her concerns today include:  Pt with hx of HTN, chronic constipation, anal fistulas, vertigo, HL, chronic back pain (followed by pain specialist), hot flashes (on gabapentin) and GERD  HTN: needs RF on amlodipine.  She reports compliance with amlodipine and spironolactone.  She took her medicines already for today.  She limits salt in the foods.  No chest pains or shortness of breath.  Chronic cough: Cough bad at nights with drainage at back of throat.  Little scratchy throat. No wheezing.  No nasal congestion. Compliant with Claritin and Flonase at nights.  Also uses Albuterol at nights.  Takes Tessalon Perles PRN. Feels GERD well controlled on Omeprazole.  Not on ACE-I.  Has upcoming appt with ENT  HL:  taking and tolerating Crestor  Wanting Toradol shot for chronic back pain with RT side sciatica.  [facility].  On oxycodone.    HM:  reports having [facility].  Also had COVID booster Patient Active Problem List   Diagnosis Date Noted   Altered consciousness 12/10/2018   Carpal tunnel syndrome of right wrist 06/12/2018   Postnasal drip 06/12/2018   Vertigo 10/18/2016   Disturbance of skin sensation 04/19/2014   Anal fistula 04/14/2013   UNSPECIFIED SITE OF SPRAIN AND STRAIN 07/07/2009   PEDAL EDEMA 06/29/2009   MUSCLE PAIN 06/23/2009   ANEMIA-IRON DEFICIENCY 03/11/2009   Allergic rhinitis 03/11/2009   SYNCOPE, VASOVAGAL 03/11/2009   HEADACHE 03/11/2009     Current Outpatient Medications on File Prior to Visit  Medication Sig Dispense Refill   albuterol (VENTOLIN HFA) 108 (90 Base) MCG/ACT inhaler Inhale 2 puffs into the lungs every 6 (six) hours as needed for wheezing or shortness of breath. 6.7 g 1   Blood  Pressure Monitor DEVI Use as directed to check home blood pressure 2-3 times a week 1 Device 0   diclofenac sodium (VOLTAREN) 1 % GEL Apply 2 g topically 4 (four) times daily. 100 g 0   diphenhydrAMINE (BENADRYL) 25 MG tablet Take 1 tablet (25 mg total) by mouth every 6 (six) hours as needed. 20 tablet 0   DULoxetine (CYMBALTA) 30 MG capsule Take 1 capsule by mouth at bedtime.     EPINEPHrine (EPIPEN 2-PAK) 0.3 mg/0.3 mL IJ SOAJ injection Inject 0.3 mLs (0.3 mg total) into the muscle as needed for anaphylaxis. (Patient not taking: Reported on 08/22/2021) 1 each 0   fluticasone (FLONASE) 50 MCG/ACT nasal spray Place 1 spray into both nostrils daily. 16 g 2   gabapentin (NEURONTIN) 300 MG capsule Take 1 capsule (300 mg total) by mouth at bedtime. 30 capsule 3   linaclotide (LINZESS) 290 MCG CAPS capsule TAKE 1 CAPSULE(290 MCG) BY MOUTH DAILY BEFORE BREAKFAST 30 capsule 2   loratadine (CLARITIN) 10 MG tablet Take 1 tablet (10 mg total) by mouth daily. 30 tablet 6   meclizine (ANTIVERT) 25 MG tablet TAKE 1 TABLET(25 MG) BY MOUTH TWICE DAILY AS NEEDED FOR DIZZINESS 20 tablet 0   omeprazole (PRILOSEC) 20 MG capsule TAKE 1 CAPSULE(20 MG) BY MOUTH TWICE DAILY BEFORE A MEAL 60 capsule 0   oxycodone (OXY-IR) 5 MG capsule Take 5 mg by mouth every 8 (eight) hours as needed.     rosuvastatin (CRESTOR) 20  MG tablet Take 1 tablet (20 mg total) by mouth daily. 30 tablet 4   spironolactone (ALDACTONE) 25 MG tablet TAKE 1/2 TABLET(12.5 MG) BY MOUTH DAILY 34 tablet 3   No current facility-administered medications on file prior to visit.    Allergies  Allergen Reactions   Ampicillin Anaphylaxis   Atorvastatin Calcium Other (See Comments)    Pt reported lip swelling   Penicillins Anaphylaxis   Sulfonamide Derivatives Anaphylaxis   Hydrocodone-Acetaminophen Hives    Have to take a Benadryl to take med   Tramadol Itching   Tylenol [Acetaminophen] Itching    Social History   Socioeconomic History   Marital  status: Single    Spouse name: Not on file   Number of children: 2   Years of education: 10   Highest education level: Not on file  Occupational History   Occupation: N/A  Tobacco Use   Smoking status: Never   Smokeless tobacco: Never  Vaping Use   Vaping Use: Never used  Substance and Sexual Activity   Alcohol use: Never    Comment: occa   Drug use: No   Sexual activity: Yes    Birth control/protection: Pill  Other Topics Concern   Not on file  Social History Narrative   Lives at home w/ her mother   Right-handed   Caffeine: 1 cup of tea per day   Social Determinants of Health   Financial Resource Strain: Not on file  Food Insecurity: Not on file  Transportation Needs: Not on file  Physical Activity: Not on file  Stress: Not on file  Social Connections: Not on file  Intimate Partner Violence: Not on file    Family History  Problem Relation Age of Onset   Diabetes Mother    Hypertension Mother    Heart attack Father    Seizures Sister    Seizures Sister    Diabetes Brother    Hypertension Brother    Diabetes Maternal Aunt    Diabetes Maternal Uncle    Hypertension Maternal Uncle    Hypertension Son    Colon cancer Neg Hx    Stomach cancer Neg Hx     Past Surgical History:  Procedure Laterality Date   ANAL FISTULECTOMY  08/22/2012   Procedure: FISTULECTOMY ANAL;  Surgeon: Madilyn Hook, DO;  Location: [place];  Service: General;  Laterality: N/A;  rectal examination under anesthesia with seton placement    ANAL FISTULECTOMY  09/17/2012   Procedure: FISTULECTOMY ANAL;  Surgeon: Madilyn Hook, DO;  Location: [place];  Service: General;  Laterality: N/A;  possible fistulotomy   D & C HYSTEROSCOPY/ NOVASURE ENDOMETRIAL ABLATION/ I & D THROMBOSED HEMORROID  01-10-2007  DR [place]al [place] ROSS   EXAMINATION UNDER ANESTHESIA  09/17/2012   Procedure: EXAM UNDER ANESTHESIA;  Surgeon: Madilyn Hook, DO;  Location: [place];  Service: General;   Laterality: N/A;  Rectal exam under anesthesia   EXAMINATION UNDER ANESTHESIA N/A 05/14/2013   Procedure: EXAM UNDER ANESTHESIA;  Surgeon: Leighton Ruff, MD;  Location: [facility];  Service: General;  Laterality: N/A;   FISTULA PLUG  09/17/2012   Procedure: FISTULA PLUG;  Surgeon: Madilyn Hook, DO;  Location: [place];  Service: General;  Laterality: N/A;  Possible fistula plug   HEMORRHOID SURGERY  04-28-2009   [facility] INTERNAL HEMORRHOIDS   IRRIGATION AND DEBRIDEMENT ABSCESS  07/08/2012   Procedure: MINOR INCISION AND DRAINAGE OF ABSCESS;  Surgeon: Thurnell Lose, MD;  Location: [place];  Service: Gynecology;  Laterality: N/A;  Vulvar Abscess   PLACEMENT OF SETON N/A 05/14/2013   Procedure:  PLACEMENT OF SETON;  Surgeon: Romie Levee, MD;  Location: [facility];  Service: General;  Laterality: N/A;   RECTAL EXAM UNDER ANESTHESIA N/A 08/20/2013   Procedure: mucosal advancement flap ;  Surgeon: Romie Levee, MD;  Location: [facility];  Service: General;  Laterality: N/A;  parks anal retractor long rectal instrucments prone jack knife anal fistula    RECTAL ULTRASOUND N/A 07/20/2013   Procedure: RECTAL ULTRASOUND;  Surgeon: Romie Levee, MD;  [facility];  Service: Endoscopy;  Laterality: N/A;    ROS: Review of Systems Negative except as stated above  PHYSICAL EXAM: BP 109/76   Pulse 95   Resp 16   Wt 160 lb 9.6 oz (72.8 kg)   SpO2 97%   BMI 27.57 kg/m   Physical Exam  General appearance - alert, well appearing, and in no distress Mental status - normal mood, behavior, speech, dress, motor activity, and thought processes Nose - normal and patent, no erythema, discharge or polyps Mouth - mucous membranes moist, pharynx normal without lesions Neck - supple, no significant adenopathy Chest - clear to auscultation, no wheezes, rales or rhonchi, symmetric air entry Heart - normal rate, regular rhythm, normal S1, S2, no  murmurs, rubs, clicks or gallops Extremities - peripheral pulses normal, no pedal edema, no clubbing or cyanosis Neuro: Power in both lower extremities 5/5 proximally and distally.  CMP Latest Ref Rng & Units 06/13/2021 02/06/2021 01/28/2020  Glucose 65 - 99 mg/dL 95 097(D) 91  BUN 6 - 24 mg/dL 9 8 7   Creatinine 0.57 - 1.00 mg/dL 5.32 9.92  Sodium 134 - 144 mmol/L 145(H) 142 144  Potassium 3.5 - 5.2 mmol/L 4.1 4.1 4.1  Chloride 96 - 106 mmol/L 102 101 103  CO2 20 - 29 mmol/L 24 26 27   Calcium 8.7 - 10.2 mg/dL 10.4(H) 9.3 9.7  Total Protein 6.0 - 8.5 g/dL - 6.8 7.2  Total Bilirubin 0.0 - 1.2 mg/dL - 0.2 4.26  Alkaline Phos 44 - 121 IU/L - 76 83  AST 0 - 40 IU/L - 21 17  ALT 0 - 32 IU/L - 20 8   Lipid Panel     Component Value Date/Time   CHOL 204 (H) 02/06/2021 1611   TRIG 125 02/06/2021 1611   HDL 61 02/06/2021 1611   CHOLHDL 3.3 02/06/2021 1611   CHOLHDL 3.8 03/19/2009 0414   VLDL 13 03/19/2009 0414   LDLCALC 121 (H) 02/06/2021 1611    CBC    Component Value Date/Time   WBC 6.2 03/21/2021 1156   WBC 5.5 04/01/2019 1054   RBC 4.63 03/21/2021 1156   RBC 4.34 04/01/2019 1054   HGB 13.8 03/21/2021 1156   HGB 13.8 09/05/2009 1347   HCT 42.0 03/21/2021 1156   HCT 41.3 09/05/2009 1347   PLT 291 03/21/2021 1156   MCV 91 03/21/2021 1156   MCV 92.0 09/05/2009 1347   MCH 29.8 03/21/2021 1156   MCH 29.5 04/01/2019 1054   MCHC 32.9 03/21/2021 1156   MCHC 32.3 04/01/2019 1054   RDW 12.8 03/21/2021 1156   RDW 13.0 09/05/2009 1347   LYMPHSABS 2.2 04/01/2019 1054   LYMPHSABS 2.9 03/16/2019 1427   LYMPHSABS 2.1 09/05/2009 1347   MONOABS 0.4 04/01/2019 1054   MONOABS 0.3 09/05/2009 1347   EOSABS 0.1 04/01/2019 1054   EOSABS 0.1 03/16/2019 1427  BASOSABS 0.1 04/01/2019 1054   BASOSABS 0.1 03/16/2019 1427   BASOSABS 0.1 09/05/2009 1347    ASSESSMENT AND PLAN: 1. Essential hypertension At goal.  Continue current medications and low-salt diet. - amLODipine (NORVASC) 10 MG  tablet; Take 1 tablet (10 mg total) by mouth daily.  Dispense: 90 tablet; Refill: 1  2. Chronic cough Even though she has symptoms of postnasal drip and is on medication for it, cough is not responding to it.  GERD is under control.  Will refer to pulmonary for further evaluation. - Ambulatory referral to Pulmonology  3. Post-nasal drip - benzonatate (TESSALON) 100 MG capsule; TAKE 1 CAPSULE(100 MG) BY MOUTH TWICE DAILY AS NEEDED FOR COUGH  Dispense: 30 capsule; Refill: 1  4. Mixed hyperlipidemia Continue Crestor.  5. Chronic radicular pain of lower back Continue follow-up with her pain specialist.  Toradol injection given today. - ketorolac (TORADOL) injection 60 mg   Patient was given the opportunity to ask questions.  Patient verbalized understanding of the plan and was able to repeat key elements of the plan.   Orders Placed This Encounter  Procedures   Ambulatory referral to Pulmonology     Requested Prescriptions   Signed Prescriptions Disp Refills   amLODipine (NORVASC) 10 MG tablet 90 tablet 1    Sig: Take 1 tablet (10 mg total) by mouth daily.   benzonatate (TESSALON) 100 MG capsule 30 capsule 1    Sig: TAKE 1 CAPSULE(100 MG) BY MOUTH TWICE DAILY AS NEEDED FOR COUGH    Return in about 4 months (around 02/10/2022).  Karle Plumber, MD, FACP

## 2021-10-17 DIAGNOSIS — H9311 Tinnitus, right ear: Secondary | ICD-10-CM | POA: Diagnosis not present

## 2021-10-17 DIAGNOSIS — H9071 Mixed conductive and sensorineural hearing loss, unilateral, right ear, with unrestricted hearing on the contralateral side: Secondary | ICD-10-CM | POA: Diagnosis not present

## 2021-10-17 DIAGNOSIS — H6122 Impacted cerumen, left ear: Secondary | ICD-10-CM | POA: Diagnosis not present

## 2021-10-19 ENCOUNTER — Other Ambulatory Visit: Payer: Self-pay | Admitting: Physician Assistant

## 2021-10-19 DIAGNOSIS — H918X9 Other specified hearing loss, unspecified ear: Secondary | ICD-10-CM

## 2021-10-20 ENCOUNTER — Other Ambulatory Visit: Payer: Self-pay | Admitting: Internal Medicine

## 2021-10-20 DIAGNOSIS — K219 Gastro-esophageal reflux disease without esophagitis: Secondary | ICD-10-CM

## 2021-10-25 ENCOUNTER — Institutional Professional Consult (permissible substitution): Payer: Medicare Other | Admitting: Pulmonary Disease

## 2021-10-26 DIAGNOSIS — M5416 Radiculopathy, lumbar region: Secondary | ICD-10-CM | POA: Diagnosis not present

## 2021-10-26 DIAGNOSIS — M545 Low back pain, unspecified: Secondary | ICD-10-CM | POA: Diagnosis not present

## 2021-11-13 ENCOUNTER — Other Ambulatory Visit: Payer: Self-pay

## 2021-11-13 ENCOUNTER — Encounter: Payer: Self-pay | Admitting: Internal Medicine

## 2021-11-13 ENCOUNTER — Ambulatory Visit (INDEPENDENT_AMBULATORY_CARE_PROVIDER_SITE_OTHER): Payer: Medicare Other | Admitting: Internal Medicine

## 2021-11-13 VITALS — BP 104/60 | HR 100 | Temp 98.4°F | Ht 64.0 in | Wt 155.2 lb

## 2021-11-13 DIAGNOSIS — R053 Chronic cough: Secondary | ICD-10-CM

## 2021-11-13 DIAGNOSIS — R0602 Shortness of breath: Secondary | ICD-10-CM | POA: Diagnosis not present

## 2021-11-13 NOTE — Patient Instructions (Signed)
Please schedule follow up scheduled with myself in 2 months.  If my schedule is not open yet, we will contact you with a reminder closer to that time. Please call [phone] if you haven't heard from [place] a month before.   Before your next visit I would like you to have:  Spirometry/Feno - at next visit  Stop claritin. Switch to cetirizine which I sent to your pharmacy.  Also start montelukast which is another allergy pill.   Flonase - 1 spray on each side of your nose twice a day for first week, then 1 spray on each side.   Instructions for use: If you also use a saline nasal spray or rinse, use that first. Position the head with the chin slightly tucked. Use the right hand to spray into the left nostril and the right hand to spray into the left nostril.   Point the bottle away from the septum of your nose (cartilage that divides the two sides of your nose).  Hold the nostril closed on the opposite side from where you will spray Spray once and gently sniff to pull the medicine into the higher parts of your nose.  Don't sniff too hard as the medicine will drain down the back of your throat instead. Repeat with a second spray on the same side if prescribed. Repeat on the other side of your nose.

## 2021-11-13 NOTE — Progress Notes (Signed)
[name]    [id number]    03/24/1966  Primary Care Physician:Johnson, Dalbert Batman, MD  Referring Physician: Ladell Pier, MD [address] [place],  [place] [address] Reason for Consultation: cough Date of Consultation: 11/13/2021  Chief complaint:   Chief Complaint  Patient presents with   Consult    Chronic cough     HPI: [name] is a 56 y.o. woman who presents for new patient evaluation of cough of one month.  She had these symptoms last year. She has chills but fevers. No sick contacts. Cough is non productive but sometimes with mucus. She has voice hoarseness. She has nasal congestion. She has itchy eyes  She tried benzonatate lozenges for her cough. Did not help.   No myalgias or arthralgias. She has some ear pain.   She denies chest tightness or wheezing.   She has reflux and is on prilosec   She has an albuterol inhaler which she takes for dyspnea 3-4 times/day. However she has trouble taking a deep breath in.     Social history:  Occupation: she is disabled for DDD Exposures: lives at home with mom.  Smoking history: never smoker, no passive smoke exposure.  Social History   Occupational History   Occupation: N/A  Tobacco Use   Smoking status: Never   Smokeless tobacco: Never  Vaping Use   Vaping Use: Never used  Substance and Sexual Activity   Alcohol use: Never    Comment: occa   Drug use: No   Sexual activity: Yes    Birth control/protection: Pill    Relevant family history:  Family History  Problem Relation Age of Onset   Diabetes Mother    Hypertension Mother    Heart attack Father    Seizures Sister    Seizures Sister    Diabetes Brother    Hypertension Brother    Diabetes Maternal Aunt    Diabetes Maternal Uncle    Hypertension Maternal Uncle    Hypertension Son    Colon cancer Neg Hx    Stomach cancer Neg Hx     Past Medical History:  Diagnosis Date   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 Claritin   Vertigo     Past Surgical History:  Procedure Laterality Date   ANAL FISTULECTOMY  08/22/2012   Procedure: FISTULECTOMY ANAL;  Surgeon: Madilyn Hook, DO;  Location: [facility];  Service: General;  Laterality: N/A;  rectal examination under anesthesia with seton placement    ANAL FISTULECTOMY  09/17/2012   Procedure: FISTULECTOMY ANAL;  Surgeon: Madilyn Hook, DO;  Location: [facility];  Service: General;  Laterality: N/A;  possible fistulotomy   D & C HYSTEROSCOPY/ NOVASURE ENDOMETRIAL ABLATION/ I & D THROMBOSED HEMORROID  01-10-2007  [facility] [place] [place] [facility]   EXAMINATION UNDER ANESTHESIA  09/17/2012   Procedure: EXAM UNDER ANESTHESIA;  Surgeon: Madilyn Hook, DO;  Location: [facility];  Service: General;  Laterality: N/A;  Rectal exam under anesthesia   EXAMINATION UNDER ANESTHESIA N/A 05/14/2013   Procedure: EXAM UNDER ANESTHESIA;  Surgeon: Leighton Ruff, MD;  Location: [facility];  Service: General;  Laterality: N/A;   FISTULA PLUG  09/17/2012   Procedure: FISTULA PLUG;  Surgeon: Madilyn Hook,  DO;  Location: [facility];  Service: General;  Laterality: N/A;  Possible fistula plug   HEMORRHOID SURGERY  04-28-2009   [facility] INTERNAL HEMORRHOIDS   IRRIGATION AND DEBRIDEMENT ABSCESS  07/08/2012   Procedure: MINOR INCISION AND DRAINAGE OF ABSCESS;  Surgeon: Thurnell Lose, MD;  Location: [facility];  Service: Gynecology;  Laterality: N/A;  Vulvar Abscess   PLACEMENT OF SETON N/A 05/14/2013   Procedure:  PLACEMENT OF SETON;  Surgeon: Leighton Ruff, MD;  Location: [facility];  Service: General;  Laterality: N/A;   RECTAL EXAM UNDER ANESTHESIA N/A 08/20/2013   Procedure: mucosal advancement flap ;  Surgeon: Leighton Ruff, MD;  Location: [facility];  Service: General;  Laterality: N/A;   parks anal retractor long rectal instrucments prone jack knife anal fistula    RECTAL ULTRASOUND N/A 07/20/2013   Procedure: RECTAL ULTRASOUND;  Surgeon: Leighton Ruff, MD;  Location: [facility];  Service: Endoscopy;  Laterality: N/A;     Physical Exam: Blood pressure 104/60, pulse 100, temperature 98.4 F (36.9 C), temperature source Oral, height 5\' 4"  (1.626 m), weight 155 lb 3.2 oz (70.4 kg), SpO2 99 %. Gen:      No acute distress, frequent coughing ENT:  +nasal debris, no nasal polyps, mucus membranes moist, mallampati IV Lungs:    No increased respiratory effort, symmetric chest wall excursion, clear to auscultation bilaterally, no wheezes or crackles CV:         Regular rate and rhythm; no murmurs, rubs, or gallops.  No pedal edema Abd:      + bowel sounds; soft, non-tender; no distension MSK: no acute synovitis of DIP or PIP joints, no mechanics hands.  Skin:      Warm and dry; no rashes Neuro: normal speech, no focal facial asymmetry Psych: alert and oriented x3, normal mood and affect   Data Reviewed/Medical Decision Making:  Independent interpretation of tests: Imaging:  Review of patient's chest xray 2017 images revealed no acute process. The patient's images have been independently reviewed by me.    PFTs:  No flowsheet data found.  Labs:  Lab Results  Component Value Date   WBC 6.2 03/21/2021   HGB 13.8 03/21/2021   HCT 42.0 03/21/2021   MCV 91 03/21/2021   PLT 291 03/21/2021   Lab Results  Component Value Date   NA 145 (H) 06/13/2021   K 4.1 06/13/2021   CL 102 06/13/2021   CO2 24 06/13/2021      Immunization status:  Immunization History  Administered Date(s) Administered   Influenza Whole 08/10/2009   Influenza,inj,Quad PF,6+ Mos 11/21/2017, 06/18/2019, 06/27/2020   Influenza-Unspecified 08/05/2016   Moderna Sars-Covid-2 Vaccination 01/22/2020, 02/18/2020, 08/30/2020, 04/13/2021   Pfizer Covid-19 Vaccine Bivalent Booster 31yrs & up 07/26/2021    Pneumococcal Conjugate-13 09/18/2020   Tdap 10/13/2018   Zoster Recombinat (Shingrix) 11/17/2020, 07/09/2021     I reviewed prior external note(s) from PCP  I reviewed the result(s) of the labs and imaging as noted above.   I have ordered spirometry/FENO  Assessment:  Chronic cough Post nasal drainage Shortness of breath   Plan/Recommendations: Likely cough from post nasal drainage.  Corrected flonase technique. Stop claritin. Switch to zyrtec. Add singulair.  Spirometry and feno at next visit to evaluate for asthma.   We discussed disease management and progression at length today.    Return to Care: Return in about 8 weeks (around 01/08/2022).  Lenice Llamas, MD Pulmonary and [facility]  CC: Ladell Pier, MD

## 2021-11-14 ENCOUNTER — Telehealth: Payer: Self-pay | Admitting: Internal Medicine

## 2021-11-14 ENCOUNTER — Other Ambulatory Visit: Payer: Self-pay | Admitting: Internal Medicine

## 2021-11-14 DIAGNOSIS — R0982 Postnasal drip: Secondary | ICD-10-CM

## 2021-11-14 MED ORDER — FLUTICASONE PROPIONATE 50 MCG/ACT NA SUSP
1.0000 | Freq: Every day | NASAL | 3 refills | Status: DC
Start: 2021-11-14 — End: 2022-06-14

## 2021-11-14 MED ORDER — MONTELUKAST SODIUM 10 MG PO TABS: 10.0000 mg | ORAL_TABLET | Freq: Every day | ORAL | 5 refills | Status: DC

## 2021-11-14 MED ORDER — CETIRIZINE HCL 10 MG PO TABS: 10.0000 mg | ORAL_TABLET | Freq: Every day | ORAL | 5 refills | Status: DC

## 2021-11-14 NOTE — Telephone Encounter (Signed)
Requested Prescriptions  Pending Prescriptions Disp Refills   benzonatate (TESSALON) 100 MG capsule [Pharmacy Med Name: BENZONATATE 100MG  CAPSULES] 30 capsule 1    Sig: TAKE 1 CAPSULE(100 MG) BY MOUTH TWICE DAILY AS NEEDED FOR COUGH     Ear, Nose, and Throat:  Antitussives/Expectorants Passed - 11/14/2021 12:42 PM      Passed - Valid encounter within last 12 months    Recent Outpatient Visits          1 month ago Essential hypertension   [facility] 01/12/2022 B, MD   5 months ago Right lower quadrant abdominal pain   [facility] B, MD   7 months ago Right lower quadrant abdominal pain   [facility], MD   9 months ago Essential hypertension   [facility], MD   11 months ago Dermatitis   [facility], MD      Future Appointments            In 1 month Marcine Matar, Celine Mans, MD [facility]   In 3 months Olene Craven, MD [facility]

## 2021-11-14 NOTE — Telephone Encounter (Signed)
Okay to send in meds

## 2021-11-14 NOTE — Telephone Encounter (Signed)
Prescriptions from [facility] sent to requested [facility].  Patient aware prescriptions sent.  Nothing further at this time.

## 2021-11-14 NOTE — Telephone Encounter (Signed)
AO please advise if okay sent to send medication:  Pt seen yesterday and meds written on [facility] just wanted to confirm dosages.   Flonase 1 spray each nostril daily  Montelukast 10mg  daily Cetrizine 10mg  daily   Thanks :)

## 2021-11-14 NOTE — Telephone Encounter (Signed)
Patient states [facility] still hasn't received a prescription for her meds. Please advise

## 2021-11-16 ENCOUNTER — Ambulatory Visit
Admission: RE | Admit: 2021-11-16 | Discharge: 2021-11-16 | Disposition: A | Discharge status: Home / Self Care | Payer: Medicare Other | Source: Ambulatory Visit | Attending: Physician Assistant | Admitting: Physician Assistant

## 2021-11-16 DIAGNOSIS — I739 Peripheral vascular disease, unspecified: Secondary | ICD-10-CM | POA: Diagnosis not present

## 2021-11-16 DIAGNOSIS — H918X9 Other specified hearing loss, unspecified ear: Secondary | ICD-10-CM

## 2021-11-16 DIAGNOSIS — H9191 Unspecified hearing loss, right ear: Secondary | ICD-10-CM | POA: Diagnosis not present

## 2021-11-16 MED ORDER — GADOBENATE DIMEGLUMINE 529 MG/ML IV SOLN
15.0000 mL | Freq: Once | INTRAVENOUS | Status: AC | PRN
  Administered 2021-11-16: 15 mL via INTRAVENOUS

## 2021-11-28 DIAGNOSIS — M5116 Intervertebral disc disorders with radiculopathy, lumbar region: Secondary | ICD-10-CM | POA: Diagnosis not present

## 2021-11-28 DIAGNOSIS — M48061 Spinal stenosis, lumbar region without neurogenic claudication: Secondary | ICD-10-CM | POA: Diagnosis not present

## 2021-11-28 DIAGNOSIS — M4726 Other spondylosis with radiculopathy, lumbar region: Secondary | ICD-10-CM | POA: Diagnosis not present

## 2021-12-02 ENCOUNTER — Other Ambulatory Visit: Payer: Self-pay | Admitting: Internal Medicine

## 2021-12-02 DIAGNOSIS — K5909 Other constipation: Secondary | ICD-10-CM

## 2021-12-02 NOTE — Telephone Encounter (Signed)
Requested Prescriptions  Pending Prescriptions Disp Refills   LINZESS 290 MCG CAPS capsule [Pharmacy Med Name: Rolan Lipa 290MCG CAPSULES] 30 capsule 2    Sig: TAKE 1 CAPSULE(290 MCG) BY MOUTH DAILY BEFORE BREAKFAST     Gastroenterology: Irritable Bowel Syndrome Passed - 12/02/2021  6:17 AM      Passed - Valid encounter within last 12 months    Recent Outpatient Visits          1 month ago Essential hypertension   [place], MD   5 months ago Right lower quadrant abdominal pain   [place], MD   8 months ago Right lower quadrant abdominal pain   [place], MD   10 months ago Essential hypertension   [place], MD   11 months ago Dermatitis   [place], MD      Future Appointments            In 1 month Shearon Stalls, Senaida Ores, MD [facility]   In 2 months Ladell Pier, MD [place]

## 2021-12-16 ENCOUNTER — Other Ambulatory Visit: Payer: Self-pay | Admitting: Internal Medicine

## 2021-12-16 ENCOUNTER — Telehealth: Payer: Self-pay | Admitting: Internal Medicine

## 2021-12-16 DIAGNOSIS — I10 Essential (primary) hypertension: Secondary | ICD-10-CM

## 2021-12-16 NOTE — Telephone Encounter (Signed)
Requested Prescriptions  Pending Prescriptions Disp Refills   spironolactone (ALDACTONE) 25 MG tablet [Pharmacy Med Name: SPIRONOLACTONE 25MG TABLETS] 34 tablet 3    Sig: TAKE 1/2 TABLET(12.5 MG) BY MOUTH DAILY     Cardiovascular: Diuretics - Aldosterone Antagonist Failed - 12/16/2021  6:24 AM      Failed - Cr in normal range and within 180 days    Creatinine, Ser  Date Value Ref Range Status  06/13/2021 0.83 0.57 - 1.00 mg/dL Final         Failed - K in normal range and within 180 days    Potassium  Date Value Ref Range Status  06/13/2021 4.1 3.5 - 5.2 mmol/L Final         Failed - Na in normal range and within 180 days    Sodium  Date Value Ref Range Status  06/13/2021 145 (H) 134 - 144 mmol/L Final         Failed - eGFR is 30 or above and within 180 days    GFR calc Af Amer  Date Value Ref Range Status  01/28/2020 90 >59 mL/min/1.73 Final   GFR calc non Af Amer  Date Value Ref Range Status  01/28/2020 78 >59 mL/min/1.73 Final   GFR  Date Value Ref Range Status  03/01/2015 113.70 >60.00 mL/min Final   eGFR  Date Value Ref Range Status  06/13/2021 83 >59 mL/min/1.73 Final         Passed - Last BP in normal range    BP Readings from Last 1 Encounters:  11/13/21 104/60         Passed - Valid encounter within last 6 months    Recent Outpatient Visits          2 months ago Essential hypertension   [place], Deborah B, MD   6 months ago Right lower quadrant abdominal pain   [place], MD   9 months ago Right lower quadrant abdominal pain   [place], Deborah B, MD   10 months ago Essential hypertension   [place], Deborah B, MD   1 year ago Dermatitis   [place], MD      Future Appointments            In 3 weeks Spero Geralds, MD  [facility]   In 1 month Ladell Pier, MD [place]

## 2021-12-18 DIAGNOSIS — H905 Unspecified sensorineural hearing loss: Secondary | ICD-10-CM | POA: Diagnosis not present

## 2021-12-18 NOTE — Telephone Encounter (Signed)
Refilled 12/16/2021 #34 1 refill. Requested Prescriptions  Pending Prescriptions Disp Refills   spironolactone (ALDACTONE) 25 MG tablet [Pharmacy Med Name: SPIRONOLACTONE 25MG TABLETS] 34 tablet 1    Sig: TAKE 1/2 TABLET(12.5 MG) BY MOUTH DAILY     Cardiovascular: Diuretics - Aldosterone Antagonist Failed - 12/16/2021  8:21 AM      Failed - Cr in normal range and within 180 days    Creatinine, Ser  Date Value Ref Range Status  06/13/2021 0.83 0.57 - 1.00 mg/dL Final         Failed - K in normal range and within 180 days    Potassium  Date Value Ref Range Status  06/13/2021 4.1 3.5 - 5.2 mmol/L Final         Failed - Na in normal range and within 180 days    Sodium  Date Value Ref Range Status  06/13/2021 145 (H) 134 - 144 mmol/L Final         Failed - eGFR is 30 or above and within 180 days    GFR calc Af Amer  Date Value Ref Range Status  01/28/2020 90 >59 mL/min/1.73 Final   GFR calc non Af Amer  Date Value Ref Range Status  01/28/2020 78 >59 mL/min/1.73 Final   GFR  Date Value Ref Range Status  03/01/2015 113.70 >60.00 mL/min Final   eGFR  Date Value Ref Range Status  06/13/2021 83 >59 mL/min/1.73 Final         Passed - Last BP in normal range    BP Readings from Last 1 Encounters:  11/13/21 104/60         Passed - Valid encounter within last 6 months    Recent Outpatient Visits          2 months ago Essential hypertension   [place], Deborah B, MD   6 months ago Right lower quadrant abdominal pain   [place], MD   9 months ago Right lower quadrant abdominal pain   [place], Deborah B, MD   10 months ago Essential hypertension   [place], Deborah B, MD   1 year ago Dermatitis   [place], MD      Future Appointments             In 3 weeks Spero Geralds, MD [facility]   In 1 month Ladell Pier, MD [place]

## 2021-12-20 DIAGNOSIS — M5136 Other intervertebral disc degeneration, lumbar region: Secondary | ICD-10-CM | POA: Diagnosis not present

## 2021-12-20 DIAGNOSIS — M4726 Other spondylosis with radiculopathy, lumbar region: Secondary | ICD-10-CM | POA: Diagnosis not present

## 2022-01-08 ENCOUNTER — Ambulatory Visit: Payer: Medicare Other | Admitting: Internal Medicine

## 2022-01-15 ENCOUNTER — Other Ambulatory Visit: Payer: Self-pay | Admitting: Internal Medicine

## 2022-01-15 DIAGNOSIS — K219 Gastro-esophageal reflux disease without esophagitis: Secondary | ICD-10-CM

## 2022-01-15 NOTE — Telephone Encounter (Signed)
Requested Prescriptions  ?Pending Prescriptions Disp Refills  ?? omeprazole (PRILOSEC) 20 MG capsule [Pharmacy Med Name: OMEPRAZOLE 20MG  CAPSULES] 180 capsule 0  ?  Sig: TAKE 1 CAPSULE(20 MG) BY MOUTH TWICE DAILY BEFORE A MEAL  ?  ? Gastroenterology: Proton Pump Inhibitors Passed - 01/15/2022  6:20 AM  ?  ?  Passed - Valid encounter within last 12 months  ?  Recent Outpatient Visits   ?      ? 3 months ago Essential hypertension  ? [facility] B, MD  ? 7 months ago Right lower quadrant abdominal pain  ? [facility] B, MD  ? 10 months ago Right lower quadrant abdominal pain  ? [facility], MD  ? 11 months ago Essential hypertension  ? [facility], MD  ? 1 year ago Dermatitis  ? [facility], MD  ?  ?  ?Future Appointments   ?        ? In 4 weeks Marcine Matar, MD [facility]  ?  ? ?  ?  ?  ? ?

## 2022-01-23 DIAGNOSIS — M5136 Other intervertebral disc degeneration, lumbar region: Secondary | ICD-10-CM | POA: Diagnosis not present

## 2022-01-23 DIAGNOSIS — M461 Sacroiliitis, not elsewhere classified: Secondary | ICD-10-CM | POA: Diagnosis not present

## 2022-01-23 DIAGNOSIS — Z79891 Long term (current) use of opiate analgesic: Secondary | ICD-10-CM | POA: Diagnosis not present

## 2022-01-23 DIAGNOSIS — M47816 Spondylosis without myelopathy or radiculopathy, lumbar region: Secondary | ICD-10-CM | POA: Diagnosis not present

## 2022-02-02 ENCOUNTER — Ambulatory Visit: Payer: Self-pay

## 2022-02-02 MED ORDER — CIPROFLOXACIN HCL 500 MG PO TABS: 500.0000 mg | ORAL_TABLET | Freq: Two times a day (BID) | ORAL | 0 refills | Status: AC

## 2022-02-02 NOTE — Addendum Note (Signed)
Addended by: Jonah Blue B on: 02/02/2022 09:15 PM ? ? Modules accepted: Orders ? ?

## 2022-02-02 NOTE — Telephone Encounter (Addendum)
Phone call placed to patient this evening.  I left a voicemail message on her cell phone letting her know that I received her message and  that I will send in an antibiotic to her pharmacy at [facility] on [address].  She should keep her appointment with [place] as scheduled. ? ?Addendum: I was able to reach patient on her home phone.  I informed her that I sent in the antibiotic for her for ciprofloxacin.  Advised to pick it up and start taking and let me know if she is not getting better.  Otherwise she should keep the appointment with me that is scheduled for the 10th of this month.  Patient expressed thanks and told me she will keep her follow-up appointment. ?

## 2022-02-02 NOTE — Telephone Encounter (Signed)
?  Chief Complaint: UTI ?Symptoms: urinary burning, frequency, itching, odor and bleeding ?Frequency: 1 week for all symptoms. Bleeding started this morning ?Pertinent Negatives: NA ?Disposition: [] ED /[] Urgent Care (no appt availability in office) / [] Appointment(In office/virtual)/ []  [facility]/ [] Home Care/ [] Refused Recommended Disposition /[] [facility]/ [x]  Follow-up with PCP ?Additional Notes: advised pt no appts available at office. Attempted to schedule for Monday 02/05/22 but pt didn't want to wait until then. I advised she can go to UC today since she doesn't have the ability to do virtual care appt. Pt refused going to UC and states that Dr. would call her in something. I advised her I will send message and her nurse can follow up.  ? ? ?Reason for Disposition ? [1] SEVERE pain AND [2] not improved 2 hours after pain medicine ? ?Answer Assessment - Initial Assessment Questions ?1. SYMPTOM: "What's the main symptom you're concerned about?" (e.g., pain, itching, dryness) ?    Burning, frequency, pain, bleeding ?2. LOCATION: "Where is the  sx located?" (e.g., inside/outside, left/right) ?    vaginal ?3. ONSET: "When did the  sx  start?" ?    1 week ?4. PAIN: "Is there any pain?" If Yes, ask: "How bad is it?" (Scale: 1-10; mild, moderate, severe) ?    8 ?5. ITCHING: "Is there any itching?" If Yes, ask: "How bad is it?" (Scale: 1-10; mild, moderate, severe) ?    mild ?7. OTHER SYMPTOMS: "Do you have any other symptoms?" (e.g., fever, itching, vaginal bleeding, pain with urination, injury to genital area, vaginal foreign body) ?    Vaginal bleeding this morning ? ?Protocols used: Vaginal Symptoms-A-AH ? ?

## 2022-02-07 ENCOUNTER — Other Ambulatory Visit: Payer: Self-pay | Admitting: Internal Medicine

## 2022-02-07 DIAGNOSIS — R0982 Postnasal drip: Secondary | ICD-10-CM

## 2022-02-11 ENCOUNTER — Encounter: Payer: Self-pay | Admitting: Internal Medicine

## 2022-02-11 DIAGNOSIS — R7303 Prediabetes: Secondary | ICD-10-CM | POA: Insufficient documentation

## 2022-02-12 ENCOUNTER — Encounter: Payer: Self-pay | Admitting: Internal Medicine

## 2022-02-12 ENCOUNTER — Ambulatory Visit: Payer: Medicare Other | Attending: Internal Medicine | Admitting: Internal Medicine

## 2022-02-12 VITALS — BP 117/77 | HR 90 | Resp 16 | Wt 156.4 lb

## 2022-02-12 DIAGNOSIS — E663 Overweight: Secondary | ICD-10-CM | POA: Diagnosis not present

## 2022-02-12 DIAGNOSIS — I10 Essential (primary) hypertension: Secondary | ICD-10-CM

## 2022-02-12 DIAGNOSIS — R5383 Other fatigue: Secondary | ICD-10-CM | POA: Diagnosis not present

## 2022-02-12 DIAGNOSIS — Z87898 Personal history of other specified conditions: Secondary | ICD-10-CM

## 2022-02-12 DIAGNOSIS — E782 Mixed hyperlipidemia: Secondary | ICD-10-CM

## 2022-02-12 DIAGNOSIS — R7303 Prediabetes: Secondary | ICD-10-CM | POA: Diagnosis not present

## 2022-02-12 NOTE — Progress Notes (Signed)
? ? ?Patient ID: [name], female    DOB: 10-Jun-1966  MRN: [id number] ? ?CC: Hypertension ? ? ?Subjective: ?[name] is a 56 y.o. female who presents for chronic ds management ?Her concerns today include:  ?Pt with hx of HTN, chronic constipation, anal fistulas, vertigo, HL, chronic back pain (followed by pain specialist), hot flashes (on gabapentin) and GERD ? ?Pt had called 02/02/2022 c/o dysuria, frequency and hematuria.   ?I called in Cipro x 5 days.  She completed the antibiotics.  Reports that her symptoms have completely resolved including the hematuria. ?  ?HYPERTENSION ?Currently taking: see medication list.  Patient is on Norvasc 10 mg daily and spironolactone 25 mg 1/2 tab daily ?Med Adherence: [x]  Yes    []  No ?Medication side effects: []  Yes    [x]  No ?Adherence with salt restriction: [x]  Yes    []  No ?Home Monitoring?: []  Yes    [x]  No, no device ?Monitoring Frequency:  ?Home BP results range:  ?SOB? []  Yes    [x]  No ?Chest Pain?: []  Yes    [x]  No ?Leg swelling?: []  Yes    [x]  No ?Headaches?: []  Yes    [x]  No ?Dizziness? []  Yes    [x]  No ?Comments:  ? ?HL: Patient is taking and tolerating Crestor. ? ?Since last visit with me, she saw the pulmonologist Dr. Celine Mansesai 11/13/2021 for chronic cough.  Assessed to be due to postnasal drip and allergies.  Patient taught proper technique for use of Flonase nasal spray, changed to Zyrtec and Singulair added. ?-pt reports cough better. She does not think she has the Singulair and will pick up ? ?PreDM/Obesity:  Last A1C 6.2 a yr ago. ?She walks daily for 30 mins ?Eating Habits: admits that eating habits are poor.  She eats fast food a lot and does not cook much.  Eats a lot of fried foods. Eats fruits every now and then.  Drinks water and Pepsi.   ? ?Complains of feeling tired all the time despite getting in at least 8 hours of sleep at nights.  Denies daytime sleepiness.  Had sleep study done 2018 that was negative for sleep apnea. ? ?Patient Active Problem List  ?  Diagnosis Date Noted  ? Prediabetes 02/11/2022  ? Altered consciousness 12/10/2018  ? Carpal tunnel syndrome of right wrist 06/12/2018  ? Postnasal drip 06/12/2018  ? Vertigo 10/18/2016  ? Disturbance of skin sensation 04/19/2014  ? Anal fistula 04/14/2013  ? UNSPECIFIED SITE OF SPRAIN AND STRAIN 07/07/2009  ? PEDAL EDEMA 06/29/2009  ? MUSCLE PAIN 06/23/2009  ? ANEMIA-IRON DEFICIENCY 03/11/2009  ? Allergic rhinitis 03/11/2009  ? SYNCOPE, VASOVAGAL 03/11/2009  ? HEADACHE 03/11/2009  ?  ? ?Current Outpatient Medications on File Prior to Visit  ?Medication Sig Dispense Refill  ? albuterol (VENTOLIN HFA) 108 (90 Base) MCG/ACT inhaler Inhale 2 puffs into the lungs every 6 (six) hours as needed for wheezing or shortness of breath. 6.7 g 1  ? amLODipine (NORVASC) 10 MG tablet Take 1 tablet (10 mg total) by mouth daily. 90 tablet 1  ? benzonatate (TESSALON) 100 MG capsule TAKE 1 CAPSULE(100 MG) BY MOUTH TWICE DAILY AS NEEDED FOR COUGH 30 capsule 0  ? Blood Pressure Monitor DEVI Use as directed to check home blood pressure 2-3 times a week 1 Device 0  ? cetirizine (ZYRTEC) 10 MG tablet Take 1 tablet (10 mg total) by mouth daily. 30 tablet 5  ? diclofenac sodium (VOLTAREN) 1 % GEL Apply 2 g  topically 4 (four) times daily. 100 g 0  ? diphenhydrAMINE (BENADRYL) 25 MG tablet Take 1 tablet (25 mg total) by mouth every 6 (six) hours as needed. 20 tablet 0  ? DULoxetine (CYMBALTA) 30 MG capsule Take 1 capsule by mouth at bedtime.    ? EPINEPHrine (EPIPEN 2-PAK) 0.3 mg/0.3 mL IJ SOAJ injection Inject 0.3 mLs (0.3 mg total) into the muscle as needed for anaphylaxis. 1 each 0  ? fluticasone (FLONASE) 50 MCG/ACT nasal spray Place 1 spray into both nostrils daily. 16 g 2  ? fluticasone (FLONASE) 50 MCG/ACT nasal spray Place 1 spray into both nostrils daily. 16 g 3  ? gabapentin (NEURONTIN) 300 MG capsule Take 1 capsule (300 mg total) by mouth at bedtime. 30 capsule 3  ? LINZESS 290 MCG CAPS capsule TAKE 1 CAPSULE(290 MCG) BY MOUTH DAILY  BEFORE BREAKFAST 30 capsule 2  ? loratadine (CLARITIN) 10 MG tablet Take 1 tablet (10 mg total) by mouth daily. 30 tablet 6  ? meclizine (ANTIVERT) 25 MG tablet TAKE 1 TABLET(25 MG) BY MOUTH TWICE DAILY AS NEEDED FOR DIZZINESS 20 tablet 0  ? montelukast (SINGULAIR) 10 MG tablet Take 1 tablet (10 mg total) by mouth at bedtime. 30 tablet 5  ? omeprazole (PRILOSEC) 20 MG capsule TAKE 1 CAPSULE(20 MG) BY MOUTH TWICE DAILY BEFORE A MEAL 180 capsule 0  ? oxycodone (OXY-IR) 5 MG capsule Take 5 mg by mouth every 8 (eight) hours as needed.    ? rosuvastatin (CRESTOR) 20 MG tablet Take 1 tablet (20 mg total) by mouth daily. 30 tablet 4  ? spironolactone (ALDACTONE) 25 MG tablet TAKE 1/2 TABLET(12.5 MG) BY MOUTH DAILY 34 tablet 1  ? ?No current facility-administered medications on file prior to visit.  ? ? ?Allergies  ?Allergen Reactions  ? Ampicillin Anaphylaxis  ? Atorvastatin Calcium Other (See Comments)  ?  Pt reported lip swelling  ? Penicillins Anaphylaxis  ? Sulfonamide Derivatives Anaphylaxis  ? Hydrocodone-Acetaminophen Hives  ?  Have to take a Benadryl to take med  ? Tramadol Itching  ? Tylenol [Acetaminophen] Itching  ? ? ?Social History  ? ?Socioeconomic History  ? Marital status: Single  ?  Spouse name: Not on file  ? Number of children: 2  ? Years of education: 10  ? Highest education level: Not on file  ?Occupational History  ? Occupation: N/A  ?Tobacco Use  ? Smoking status: Never  ? Smokeless tobacco: Never  ?Vaping Use  ? Vaping Use: Never used  ?Substance and Sexual Activity  ? Alcohol use: Never  ?  Comment: occa  ? Drug use: No  ? Sexual activity: Yes  ?  Birth control/protection: Pill  ?Other Topics Concern  ? Not on file  ?Social History Narrative  ? Lives at home w/ her mother  ? Right-handed  ? Caffeine: 1 cup of tea per day  ? ?Social Determinants of Health  ? ?Financial Resource Strain: Not on file  ?Food Insecurity: Not on file  ?Transportation Needs: Not on file  ?Physical Activity: Not on file   ?Stress: Not on file  ?Social Connections: Not on file  ?Intimate Partner Violence: Not on file  ? ? ?Family History  ?Problem Relation Age of Onset  ? Diabetes Mother   ? Hypertension Mother   ? Heart attack Father   ? Seizures Sister   ? Seizures Sister   ? Diabetes Brother   ? Hypertension Brother   ? Diabetes Maternal Aunt   ? Diabetes  Maternal Uncle   ? Hypertension Maternal Uncle   ? Hypertension Son   ? Colon cancer Neg Hx   ? Stomach cancer Neg Hx   ? ? ?Past Surgical History:  ?Procedure Laterality Date  ? ANAL FISTULECTOMY  08/22/2012  ? Procedure: FISTULECTOMY ANAL;  Surgeon: Lodema Pilot, DO;  Location: [facility];  Service: General;  Laterality: N/A;  rectal examination under anesthesia with seton placement   ? ANAL FISTULECTOMY  09/17/2012  ? Procedure: FISTULECTOMY ANAL;  Surgeon: Lodema Pilot, DO;  Location: [facility];  Service: General;  Laterality: N/A;  possible fistulotomy  ? D & C HYSTEROSCOPY/ NOVASURE ENDOMETRIAL ABLATION/ I & D THROMBOSED HEMORROID  01-10-2007  DR Isaias Cowman ROSS  ? EXAMINATION UNDER ANESTHESIA  09/17/2012  ? Procedure: EXAM UNDER ANESTHESIA;  Surgeon: Lodema Pilot, DO;  Location: [facility];  Service: General;  Laterality: N/A;  Rectal exam under anesthesia  ? EXAMINATION UNDER ANESTHESIA N/A 05/14/2013  ? Procedure: EXAM UNDER ANESTHESIA;  Surgeon: Romie Levee, MD;  Location: [facility];  Service: General;  Laterality: N/A;  ? FISTULA PLUG  09/17/2012  ? Procedure: FISTULA PLUG;  Surgeon: Lodema Pilot, DO;  Location: [facility];  Service: General;  Laterality: N/A;  Possible fistula plug  ? HEMORRHOID SURGERY  04-28-2009  ? PPH INTERNAL HEMORRHOIDS  ? IRRIGATION AND DEBRIDEMENT ABSCESS  07/08/2012  ? Procedure: MINOR INCISION AND DRAINAGE OF ABSCESS;  Surgeon: Geryl Rankins, MD;  Location: [facility] [place];  Service: Gynecology;  Laterality: N/A;  Vulvar Abscess  ? PLACEMENT OF SETON N/A 05/14/2013  ? Procedure:   PLACEMENT OF SETON;  Surgeon: Romie Levee, MD;  Location: [facility];  Service: General;  Laterality: N/A;  ? RECTAL EXAM UNDER ANESTHESIA N/A 08/20/2013  ? Procedure: mucosal advanc

## 2022-02-12 NOTE — Patient Instructions (Signed)
Healthy Eating ?Following a healthy eating pattern may help you to achieve and maintain a healthy body weight, reduce the risk of chronic disease, and live a long and productive life. It is important to follow a healthy eating pattern at an appropriate calorie level for your body. Your nutritional needs should be met primarily through food by choosing a variety of nutrient-rich foods. ?What are tips for following this plan? ?Reading food labels ?Read labels and choose the following: ?Reduced or low sodium. ?Juices with 100% fruit juice. ?Foods with low saturated fats and high polyunsaturated and monounsaturated fats. ?Foods with whole grains, such as whole wheat, cracked wheat, brown rice, and wild rice. ?Whole grains that are fortified with folic acid. This is recommended for women who are pregnant or who want to become pregnant. ?Read labels and avoid the following: ?Foods with a lot of added sugars. These include foods that contain brown sugar, corn sweetener, corn syrup, dextrose, fructose, glucose, high-fructose corn syrup, honey, invert sugar, lactose, malt syrup, maltose, molasses, raw sugar, sucrose, trehalose, or turbinado sugar. ?Do not eat more than the following amounts of added sugar per day: ?6 teaspoons (25 g) for women. ?9 teaspoons (38 g) for men. ?Foods that contain processed or refined starches and grains. ?Refined grain products, such as white flour, degermed cornmeal, white bread, and white rice. ?Shopping ?Choose nutrient-rich snacks, such as vegetables, whole fruits, and nuts. Avoid high-calorie and high-sugar snacks, such as potato chips, fruit snacks, and candy. ?Use oil-based dressings and spreads on foods instead of solid fats such as butter, stick margarine, or cream cheese. ?Limit pre-made sauces, mixes, and "instant" products such as flavored rice, instant noodles, and ready-made pasta. ?Try more plant-protein sources, such as tofu, tempeh, black beans, edamame, lentils, nuts, and  seeds. ?Explore eating plans such as the Mediterranean diet or vegetarian diet. ?Cooking ?Use oil to saut? or stir-fry foods instead of solid fats such as butter, stick margarine, or lard. ?Try baking, boiling, grilling, or broiling instead of frying. ?Remove the fatty part of meats before cooking. ?Steam vegetables in water or broth. ?Meal planning ? ?At meals, imagine dividing your plate into fourths: ?One-half of your plate is fruits and vegetables. ?One-fourth of your plate is whole grains. ?One-fourth of your plate is protein, especially lean meats, poultry, eggs, tofu, beans, or nuts. ?Include low-fat dairy as part of your daily diet. ?Lifestyle ?Choose healthy options in all settings, including home, work, school, restaurants, or stores. ?Prepare your food safely: ?Wash your hands after handling raw meats. ?Keep food preparation surfaces clean by regularly washing with hot, soapy water. ?Keep raw meats separate from ready-to-eat foods, such as fruits and vegetables. ?Cook seafood, meat, poultry, and eggs to the recommended internal temperature. ?Store foods at safe temperatures. In general: ?Keep cold foods at 40?F (4.4?C) or below. ?Keep hot foods at 140?F (60?C) or above. ?Keep your freezer at 0?F (-17.8?C) or below. ?Foods are no longer safe to eat when they have been between the temperatures of 40?-140?F (4.4-60?C) for more than 2 hours. ?What foods should I eat? ?Fruits ?Aim to eat 2 cup-equivalents of fresh, canned (in natural juice), or frozen fruits each day. Examples of 1 cup-equivalent of fruit include 1 small apple, 8 large strawberries, 1 cup canned fruit, ? cup dried fruit, or 1 cup 100% juice. ?Vegetables ?Aim to eat 2?-3 cup-equivalents of fresh and frozen vegetables each day, including different varieties and colors. Examples of 1 cup-equivalent of vegetables include 2 medium carrots, 2 cups raw,  leafy greens, 1 cup chopped vegetable (raw or cooked), or 1 medium baked potato. ?Grains ?Aim to  eat 6 ounce-equivalents of whole grains each day. Examples of 1 ounce-equivalent of grains include 1 slice of bread, 1 cup ready-to-eat cereal, 3 cups popcorn, or ? cup cooked rice, pasta, or cereal. ?Meats and other proteins ?Aim to eat 5-6 ounce-equivalents of protein each day. Examples of 1 ounce-equivalent of protein include 1 egg, 1/2 cup nuts or seeds, or 1 tablespoon (16 g) peanut butter. A cut of meat or fish that is the size of a deck of cards is about 3-4 ounce-equivalents. ?Of the protein you eat each week, try to have at least 8 ounces come from seafood. This includes salmon, trout, herring, and anchovies. ?Dairy ?Aim to eat 3 cup-equivalents of fat-free or low-fat dairy each day. Examples of 1 cup-equivalent of dairy include 1 cup (240 mL) milk, 8 ounces (250 g) yogurt, 1? ounces (44 g) natural cheese, or 1 cup (240 mL) fortified soy milk. ?Fats and oils ?Aim for about 5 teaspoons (21 g) per day. Choose monounsaturated fats, such as canola and olive oils, avocados, peanut butter, and most nuts, or polyunsaturated fats, such as sunflower, corn, and soybean oils, walnuts, pine nuts, sesame seeds, sunflower seeds, and flaxseed. ?Beverages ?Aim for six 8-oz glasses of water per day. Limit coffee to three to five 8-oz cups per day. ?Limit caffeinated beverages that have added calories, such as soda and energy drinks. ?Limit alcohol intake to no more than 1 drink a day for nonpregnant women and 2 drinks a day for men. One drink equals 12 oz of beer (355 mL), 5 oz of wine (148 mL), or 1? oz of hard liquor (44 mL). ?Seasoning and other foods ?Avoid adding excess amounts of salt to your foods. Try flavoring foods with herbs and spices instead of salt. ?Avoid adding sugar to foods. ?Try using oil-based dressings, sauces, and spreads instead of solid fats. ?This information is based on general [place] nutrition guidelines. For more information, visit [employer]. Exact amounts may vary based on your nutrition  needs. ?Summary ?A healthy eating plan may help you to maintain a healthy weight, reduce the risk of chronic diseases, and stay active throughout your life. ?Plan your meals. Make sure you eat the right portions of a variety of nutrient-rich foods. ?Try baking, boiling, grilling, or broiling instead of frying. ?Choose healthy options in all settings, including home, work, school, restaurants, or stores. ?This information is not intended to replace advice given to you by your health care provider. Make sure you discuss any questions you have with your health care provider. ?Document Revised: 06/20/2021 Document Reviewed: 06/20/2021 ?Elsevier Patient Education ? [place]. ? ?

## 2022-02-13 LAB — COMPREHENSIVE METABOLIC PANEL
ALT: 17 IU/L (ref 0–32)
AST: 17 IU/L (ref 0–40)
Albumin/Globulin Ratio: 1.4 (ref 1.2–2.2)
Albumin: 4.3 g/dL (ref 3.8–4.9)
Alkaline Phosphatase: 84 IU/L (ref 44–121)
BUN/Creatinine Ratio: 10 (ref 9–23)
BUN: 8 mg/dL (ref 6–24)
Bilirubin Total: 0.3 mg/dL (ref 0.0–1.2)
CO2: 26 mmol/L (ref 20–29)
Calcium: 9.4 mg/dL (ref 8.7–10.2)
Chloride: 102 mmol/L (ref 96–106)
Creatinine, Ser: 0.81 mg/dL (ref 0.57–1.00)
Globulin, Total: 3 g/dL (ref 1.5–4.5)
Glucose: 100 mg/dL — ABNORMAL HIGH (ref 70–99)
Potassium: 4.2 mmol/L (ref 3.5–5.2)
Sodium: 144 mmol/L (ref 134–144)
Total Protein: 7.3 g/dL (ref 6.0–8.5)
eGFR: 85 mL/min/{1.73_m2} (ref >59)

## 2022-02-13 LAB — LIPID PANEL
Chol/HDL Ratio: 2.9 ratio (ref 0.0–4.4)
Cholesterol, Total: 170 mg/dL (ref 100–199)
HDL: 58 mg/dL (ref >39)
LDL Chol Calc (NIH): 93 mg/dL (ref 0–99)
Triglycerides: 107 mg/dL (ref 0–149)
VLDL Cholesterol Cal: 19 mg/dL (ref 5–40)

## 2022-02-13 LAB — URINALYSIS, ROUTINE W REFLEX MICROSCOPIC
Bilirubin, UA: NEGATIVE
Glucose, UA: NEGATIVE
Ketones, UA: NEGATIVE
Nitrite, UA: NEGATIVE
Protein,UA: NEGATIVE
RBC, UA: NEGATIVE
Specific Gravity, UA: 1.016 (ref 1.005–1.030)
Urobilinogen, Ur: 0.2 mg/dL (ref 0.2–1.0)
pH, UA: 6.5 (ref 5.0–7.5)

## 2022-02-13 LAB — CBC
Hematocrit: 37.5 % (ref 34.0–46.6)
Hemoglobin: 12.4 g/dL (ref 11.1–15.9)
MCH: 28.8 pg (ref 26.6–33.0)
MCHC: 33.1 g/dL (ref 31.5–35.7)
MCV: 87 fL (ref 79–97)
Platelets: 312 10*3/uL (ref 150–450)
RBC: 4.31 x10E6/uL (ref 3.77–5.28)
RDW: 13.4 % (ref 11.7–15.4)
WBC: 5.3 10*3/uL (ref 3.4–10.8)

## 2022-02-13 LAB — MICROSCOPIC EXAMINATION
Bacteria, UA: NONE SEEN
Casts: NONE SEEN /lpf

## 2022-02-13 LAB — HEMOGLOBIN A1C
Est. average glucose Bld gHb Est-mCnc: 143 mg/dL
Hgb A1c MFr Bld: 6.6 % — ABNORMAL HIGH (ref 4.8–5.6)

## 2022-02-13 LAB — VITAMIN D 25 HYDROXY (VIT D DEFICIENCY, FRACTURES): Vit D, 25-Hydroxy: 36 ng/mL (ref 30.0–100.0)

## 2022-02-13 NOTE — Addendum Note (Signed)
Addended by: Jonah Blue B on: 02/13/2022 01:37 PM ? ? Modules accepted: Orders ? ?

## 2022-02-14 ENCOUNTER — Telehealth: Payer: Self-pay | Admitting: Internal Medicine

## 2022-02-14 DIAGNOSIS — E119 Type 2 diabetes mellitus without complications: Secondary | ICD-10-CM | POA: Insufficient documentation

## 2022-02-14 MED ORDER — METFORMIN HCL 500 MG PO TABS: 500.0000 mg | ORAL_TABLET | Freq: Every day | ORAL | 1 refills | Status: DC

## 2022-02-14 NOTE — Telephone Encounter (Addendum)
Phone call placed to patient yesterday afternoon to go over lab results.  Patient identified using 2 patient's identification. ?Informed that urinalysis did not show any blood in the urine.  She still had some white blood cells in the urine.  However we will hold off on any additional antibiotics as her symptoms have resolved following completion of Cipro x5 days which she completed 5 days prior to her last visit. ?Advised that based on a level called A1c, she is now in the range for diabetes.  Discussed the importance of healthy eating habits as was discussed on her recent visit.  Encourage her to continue regular exercise.  I recommend starting low-dose of a medication called metformin.  I will resubmit referral to nutritionist with diagnosis of new onset diabetes.  Patient is agreeable. ?Blood cell counts are normal. ?Vitamin D level normal. ?Cholesterol level good.  Kidney and liver function tests are good. ?Patient reported some dizziness at times.  Advised patient to stay hydrated and go slow with position changes.  If symptoms do not resolve or get worse she should follow-up with me. ? ?Results for orders placed or performed in visit on 02/12/22  ?Microscopic Examination  ?Result Value Ref Range  ? WBC, UA 6-10 (A) 0 - 5 /hpf  ? RBC 0-2 0 - 2 /hpf  ? Epithelial Cells (non renal) 0-10 0 - 10 /hpf  ? Casts None seen None seen /lpf  ? Bacteria, UA None seen None seen/Few  ?CBC  ?Result Value Ref Range  ? WBC 5.3 3.4 - 10.8 x10E3/uL  ? RBC 4.31 3.77 - 5.28 x10E6/uL  ? Hemoglobin 12.4 11.1 - 15.9 g/dL  ? Hematocrit 37.5 34.0 - 46.6 %  ? MCV 87 79 - 97 fL  ? MCH 28.8 26.6 - 33.0 pg  ? MCHC 33.1 31.5 - 35.7 g/dL  ? RDW 13.4 11.7 - 15.4 %  ? Platelets 312 150 - 450 x10E3/uL  ?Comprehensive metabolic panel  ?Result Value Ref Range  ? Glucose 100 (H) 70 - 99 mg/dL  ? BUN 8 6 - 24 mg/dL  ? Creatinine, Ser 0.81 0.57 - 1.00 mg/dL  ? eGFR 85 >59 mL/min/1.73  ? BUN/Creatinine Ratio 10 9 - 23  ? Sodium 144 134 - 144 mmol/L   ? Potassium 4.2 3.5 - 5.2 mmol/L  ? Chloride 102 96 - 106 mmol/L  ? CO2 26 20 - 29 mmol/L  ? Calcium 9.4 8.7 - 10.2 mg/dL  ? Total Protein 7.3 6.0 - 8.5 g/dL  ? Albumin 4.3 3.8 - 4.9 g/dL  ? Globulin, Total 3.0 1.5 - 4.5 g/dL  ? Albumin/Globulin Ratio 1.4 1.2 - 2.2  ? Bilirubin Total 0.3 0.0 - 1.2 mg/dL  ? Alkaline Phosphatase 84 44 - 121 IU/L  ? AST 17 0 - 40 IU/L  ? ALT 17 0 - 32 IU/L  ?Lipid panel  ?Result Value Ref Range  ? Cholesterol, Total 170 100 - 199 mg/dL  ? Triglycerides 107 0 - 149 mg/dL  ? HDL 58 >39 mg/dL  ? VLDL Cholesterol Cal 19 5 - 40 mg/dL  ? LDL Chol Calc (NIH) 93 0 - 99 mg/dL  ? Chol/HDL Ratio 2.9 0.0 - 4.4 ratio  ?Hemoglobin A1c  ?Result Value Ref Range  ? Hgb A1c MFr Bld 6.6 (H) 4.8 - 5.6 %  ? Est. average glucose Bld gHb Est-mCnc 143 mg/dL  ?Urinalysis, Routine w reflex microscopic  ?Result Value Ref Range  ? Specific Gravity, UA 1.016 1.005 -  1.030  ? pH, UA 6.5 5.0 - 7.5  ? Color, UA Yellow Yellow  ? Appearance Ur Clear Clear  ? Leukocytes,UA 2+ (A) Negative  ? Protein,UA Negative Negative/Trace  ? Glucose, UA Negative Negative  ? Ketones, UA Negative Negative  ? RBC, UA Negative Negative  ? Bilirubin, UA Negative Negative  ? Urobilinogen, Ur 0.2 0.2 - 1.0 mg/dL  ? Nitrite, UA Negative Negative  ? Microscopic Examination See below:   ?VITAMIN D 25 Hydroxy (Vit-D Deficiency, Fractures)  ?Result Value Ref Range  ? Vit D, 25-Hydroxy 36.0 30.0 - 100.0 ng/mL  ? ? ?

## 2022-02-14 NOTE — Addendum Note (Signed)
Addended by: Karle Plumber B on: 02/14/2022 09:33 AM ? ? Modules accepted: Orders ? ?

## 2022-02-19 ENCOUNTER — Other Ambulatory Visit: Payer: Self-pay | Admitting: Internal Medicine

## 2022-02-19 NOTE — Telephone Encounter (Signed)
Attempted to call patient- no answer and unable to leave message. Patient is requesting extension of antibiotic- may need follow up appointment ?

## 2022-02-19 NOTE — Telephone Encounter (Signed)
Requested medication (s) are due for refill today: no ? ?Requested medication (s) are on the active medication list: yes ? ?Last refill:  02/02/22 ? ?Future visit scheduled: yes ? ?Notes to clinic:  Unable to refill per protocol, cannot delegate. Pt may need f/u OV for antibiotic. ? ? ? ?  ?Requested Prescriptions  ?Pending Prescriptions Disp Refills  ? ciprofloxacin (CIPRO) 500 MG tablet 10 tablet 0  ?  Sig: Take 1 tablet (500 mg total) by mouth 2 (two) times daily for 5 days.  ?  ? Off-Protocol Failed - 02/19/2022 10:21 AM  ?  ?  Failed - Medication not assigned to a protocol, review manually.  ?  ?  Passed - Valid encounter within last 12 months  ?  Recent Outpatient Visits   ? ?      ? 1 week ago Essential hypertension  ? [place] Ladell Pier, MD  ? 4 months ago Essential hypertension  ? [place] Karle Plumber B, MD  ? 8 months ago Right lower quadrant abdominal pain  ? [place] Karle Plumber B, MD  ? 11 months ago Right lower quadrant abdominal pain  ? [place] Ladell Pier, MD  ? 1 year ago Essential hypertension  ? [place] Ladell Pier, MD  ? ?  ?  ?Future Appointments   ? ?        ? In 3 months Ladell Pier, MD [place]  ? ?  ? ? ?  ?  ?  ? ? ?

## 2022-02-19 NOTE — Telephone Encounter (Signed)
Medication Refill - Medication:  ?ciprofloxacin (CIPRO) 500 MG tablet ? ?Has the patient contacted their pharmacy? Yes.   ?Contact PCP- Pt states she finished it, but still have a little burning sensation left, and thinks she may need a few more, to get the infection completely gone. ? ?Preferred Pharmacy (with phone number or street name):  ?[id number] - [place], [place] - [address] AT [facility] OF [address]  ?[address], [place] [address]  ?Phone:  [phone]  Fax:  [phone]  ? ?Has the patient been seen for an appointment in the last year OR does the patient have an upcoming appointment? Yes.   ? ?Agent: Please be advised that RX refills may take up to 3 business days. We ask that you follow-up with your pharmacy. ?

## 2022-02-20 ENCOUNTER — Ambulatory Visit: Payer: Medicare Other | Admitting: Dietician

## 2022-02-20 NOTE — Telephone Encounter (Signed)
Patient may need office visit. Looks like she recently called in last month and was given a 5-day course with sx of UTI. Reported resolution of symptoms upon f/u with her PCP but now is req Cipro again. No nurse triage note or other telephone note in Epic to indicate continued symptoms. Will forward to provider covering for Dr. Laural Benes to consider refill.  ?

## 2022-02-27 ENCOUNTER — Telehealth: Payer: Self-pay | Admitting: Internal Medicine

## 2022-02-27 NOTE — Telephone Encounter (Signed)
Pt is having a issue with her prescribed medication Metformin 500 mg. Pt is  complaining of sweating, dizzy and draining. Pt also stated that she tried to lower her dosage by taking a half of pill but she received the same results. ? ?Pt last time time taking the medication was 4/24. She doesn't want to continue taking the med until she speaks with her provider. Pt wants to know if any changes could be made. ? ?Best contact: [phone] ?

## 2022-03-01 NOTE — Telephone Encounter (Signed)
Left message from Dr. Dr. Laural Benes on voicemail per [facility]. Encourage to call if they have additional questions or concerns.  ? ?

## 2022-03-05 NOTE — Telephone Encounter (Signed)
2nd attempt to reach patient.  ?Left message on voicemail. Message from Dr. Laural Benes.  ? ?Stop the metformin. Work on improving eating habits and exercise.  I have referred her to nutritionist.  ?

## 2022-03-14 DIAGNOSIS — M461 Sacroiliitis, not elsewhere classified: Secondary | ICD-10-CM | POA: Diagnosis not present

## 2022-04-06 ENCOUNTER — Ambulatory Visit: Payer: Medicare Other | Admitting: Dietician

## 2022-04-08 ENCOUNTER — Other Ambulatory Visit: Payer: Self-pay | Admitting: Internal Medicine

## 2022-04-08 DIAGNOSIS — E782 Mixed hyperlipidemia: Secondary | ICD-10-CM

## 2022-04-08 DIAGNOSIS — R0982 Postnasal drip: Secondary | ICD-10-CM

## 2022-04-09 NOTE — Telephone Encounter (Signed)
Requested Prescriptions  Pending Prescriptions Disp Refills  . rosuvastatin (CRESTOR) 20 MG tablet [Pharmacy Med Name: ROSUVASTATIN 20MG  TABLETS] 30 tablet 2    Sig: TAKE 1 TABLET(20 MG) BY MOUTH DAILY     Cardiovascular:  Antilipid - Statins 2 Failed - 04/08/2022  6:24 AM      Failed - Lipid Panel in normal range within the last 12 months    Cholesterol, Total  Date Value Ref Range Status  02/12/2022 170 100 - 199 mg/dL Final   LDL Chol Calc (NIH)  Date Value Ref Range Status  02/12/2022 93 0 - 99 mg/dL Final   HDL  Date Value Ref Range Status  02/12/2022 58 >39 mg/dL Final   Triglycerides  Date Value Ref Range Status  02/12/2022 107 0 - 149 mg/dL Final         Passed - Cr in normal range and within 360 days    Creatinine, Ser  Date Value Ref Range Status  02/12/2022 0.81 0.57 - 1.00 mg/dL Final         Passed - Patient is not pregnant      Passed - Valid encounter within last 12 months    Recent Outpatient Visits          1 month ago Essential hypertension   [place] [facility] 04/14/2022, MD   5 months ago Essential hypertension   [place] [facility] Marcine Matar B, MD   10 months ago Right lower quadrant abdominal pain   [place] [facility] Jonah Blue, MD   1 year ago Right lower quadrant abdominal pain   [place] [facility] Marcine Matar, MD   1 year ago Essential hypertension   [place] [facility] Marcine Matar, MD      Future Appointments            In 2 months Marcine Matar Laural Benes, MD [facility]           . benzonatate (TESSALON) 100 MG capsule [Pharmacy Med Name: BENZONATATE 100MG  CAPSULES] 30 capsule 0    Sig: TAKE 1 CAPSULE(100 MG) BY MOUTH TWICE DAILY AS NEEDED FOR COUGH     Ear, Nose, and Throat:  Antitussives/Expectorants Passed - 04/08/2022  6:24 AM      Passed - Valid encounter  within last 12 months    Recent Outpatient Visits          1 month ago Essential hypertension   [place] [facility] , MD   5 months ago Essential hypertension   [facility] [facility] Marcine Matar, MD   10 months ago Right lower quadrant abdominal pain   [place] [facility] Marcine Matar, MD   1 year ago Right lower quadrant abdominal pain   [place] [facility], MD   1 year ago Essential hypertension   [place] [facility] Marcine Matar, MD      Future Appointments            In 2 months Marcine Matar Marcine Matar, MD [facility]

## 2022-04-09 NOTE — Telephone Encounter (Signed)
Requested medication (s) are due for refill today: yes  Requested medication (s) are on the active medication list: yes  Last refill:  02/07/22 #30 0 refills  Future visit scheduled: yes in 2 months  Notes to clinic:  do you want to give another refill?     Requested Prescriptions  Pending Prescriptions Disp Refills   benzonatate (TESSALON) 100 MG capsule [Pharmacy Med Name: BENZONATATE 100MG  CAPSULES] 30 capsule 0    Sig: TAKE 1 CAPSULE(100 MG) BY MOUTH TWICE DAILY AS NEEDED FOR COUGH     Ear, Nose, and Throat:  Antitussives/Expectorants Passed - 04/08/2022  6:24 AM      Passed - Valid encounter within last 12 months    Recent Outpatient Visits           1 month ago Essential hypertension   [place] [facility] 06/08/2022, MD   5 months ago Essential hypertension   [place] [facility] Marcine Matar, MD   10 months ago Right lower quadrant abdominal pain   [place] [facility] Marcine Matar, MD   1 year ago Right lower quadrant abdominal pain   [place] [facility] Marcine Matar, MD   1 year ago Essential hypertension   [place] [facility] Marcine Matar, MD       Future Appointments             In 2 months Marcine Matar, MD [facility]             Signed Prescriptions Disp Refills   rosuvastatin (CRESTOR) 20 MG tablet 30 tablet 2    Sig: TAKE 1 TABLET(20 MG) BY MOUTH DAILY     Cardiovascular:  Antilipid - Statins 2 Failed - 04/08/2022  6:24 AM      Failed - Lipid Panel in normal range within the last 12 months    Cholesterol, Total  Date Value Ref Range Status  02/12/2022 170 100 - 199 mg/dL Final   LDL Chol Calc (NIH)  Date Value Ref Range Status  02/12/2022 93 0 - 99 mg/dL Final   HDL  Date Value Ref Range Status  02/12/2022 58 >39 mg/dL Final   Triglycerides  Date Value Ref Range Status   02/12/2022 107 0 - 149 mg/dL Final         Passed - Cr in normal range and within 360 days    Creatinine, Ser  Date Value Ref Range Status  02/12/2022 0.81 0.57 - 1.00 mg/dL Final         Passed - Patient is not pregnant      Passed - Valid encounter within last 12 months    Recent Outpatient Visits           1 month ago Essential hypertension   [place] [facility] 04/14/2022, MD   5 months ago Essential hypertension   [place] [facility], MD   10 months ago Right lower quadrant abdominal pain   [place] [facility], MD   1 year ago Right lower quadrant abdominal pain   [place] [facility] Marcine Matar, MD   1 year ago Essential hypertension    [facility] Marcine [facility], MD       Future Appointments  In 2 months Marcine Matar, MD [facility]

## 2022-04-19 ENCOUNTER — Other Ambulatory Visit: Payer: Self-pay | Admitting: Internal Medicine

## 2022-04-19 DIAGNOSIS — K219 Gastro-esophageal reflux disease without esophagitis: Secondary | ICD-10-CM

## 2022-04-19 DIAGNOSIS — I10 Essential (primary) hypertension: Secondary | ICD-10-CM

## 2022-04-19 NOTE — Telephone Encounter (Signed)
Requested Prescriptions  Pending Prescriptions Disp Refills  . omeprazole (PRILOSEC) 20 MG capsule [Pharmacy Med Name: OMEPRAZOLE 20MG CAPSULES] 180 capsule 0    Sig: TAKE 1 CAPSULE(20 MG) BY MOUTH TWICE DAILY BEFORE A MEAL     Gastroenterology: Proton Pump Inhibitors Passed - 04/19/2022  6:20 AM      Passed - Valid encounter within last 12 months    Recent Outpatient Visits          2 months ago Essential hypertension   [place], MD   6 months ago Essential hypertension   [place], MD   10 months ago Right lower quadrant abdominal pain   [place], MD   1 year ago Right lower quadrant abdominal pain   [place], Deborah B, MD   1 year ago Essential hypertension   [place], MD      Future Appointments            In 1 month Wynetta Emery, Dalbert Batman, MD [place]           . spironolactone (ALDACTONE) 25 MG tablet [Pharmacy Med Name: SPIRONOLACTONE 25MG TABLETS] 34 tablet 1    Sig: TAKE 1/2 TABLET(12.5 MG) BY MOUTH DAILY     Cardiovascular: Diuretics - Aldosterone Antagonist Passed - 04/19/2022  6:20 AM      Passed - Cr in normal range and within 180 days    Creatinine, Ser  Date Value Ref Range Status  02/12/2022 0.81 0.57 - 1.00 mg/dL Final         Passed - K in normal range and within 180 days    Potassium  Date Value Ref Range Status  02/12/2022 4.2 3.5 - 5.2 mmol/L Final         Passed - Na in normal range and within 180 days    Sodium  Date Value Ref Range Status  02/12/2022 144 134 - 144 mmol/L Final         Passed - eGFR is 30 or above and within 180 days    GFR calc Af Amer  Date Value Ref Range Status  01/28/2020 90 >59 mL/min/1.73 Final   GFR calc non Af Amer  Date Value Ref Range  Status  01/28/2020 78 >59 mL/min/1.73 Final   GFR  Date Value Ref Range Status  03/01/2015 113.70 >60.00 mL/min Final   eGFR  Date Value Ref Range Status  02/12/2022 85 >59 mL/min/1.73 Final         Passed - Last BP in normal range    BP Readings from Last 1 Encounters:  02/12/22 117/77         Passed - Valid encounter within last 6 months    Recent Outpatient Visits          2 months ago Essential hypertension   [place] Ladell Pier, MD   6 months ago Essential hypertension   [place] Ladell Pier, MD   10 months ago Right lower quadrant abdominal pain   [place] Ladell Pier, MD   1 year ago Right lower quadrant abdominal pain   [place] Ladell Pier, MD   1 year ago Essential hypertension  [place], MD      Future Appointments            In 1 month Wynetta Emery Dalbert Batman, MD [place]

## 2022-04-23 ENCOUNTER — Other Ambulatory Visit: Payer: Self-pay | Admitting: Internal Medicine

## 2022-04-23 DIAGNOSIS — Z79891 Long term (current) use of opiate analgesic: Secondary | ICD-10-CM | POA: Diagnosis not present

## 2022-04-23 DIAGNOSIS — K219 Gastro-esophageal reflux disease without esophagitis: Secondary | ICD-10-CM

## 2022-04-23 DIAGNOSIS — M5136 Other intervertebral disc degeneration, lumbar region: Secondary | ICD-10-CM | POA: Diagnosis not present

## 2022-04-23 DIAGNOSIS — M5416 Radiculopathy, lumbar region: Secondary | ICD-10-CM | POA: Diagnosis not present

## 2022-04-23 DIAGNOSIS — M4726 Other spondylosis with radiculopathy, lumbar region: Secondary | ICD-10-CM | POA: Diagnosis not present

## 2022-05-02 ENCOUNTER — Ambulatory Visit: Payer: Medicare Other | Attending: Physician Assistant | Admitting: Physician Assistant

## 2022-05-02 ENCOUNTER — Encounter: Payer: Self-pay | Admitting: Physician Assistant

## 2022-05-02 VITALS — BP 106/72 | HR 106 | Wt 142.4 lb

## 2022-05-02 DIAGNOSIS — R112 Nausea with vomiting, unspecified: Secondary | ICD-10-CM

## 2022-05-02 DIAGNOSIS — E1165 Type 2 diabetes mellitus with hyperglycemia: Secondary | ICD-10-CM

## 2022-05-02 DIAGNOSIS — R051 Acute cough: Secondary | ICD-10-CM

## 2022-05-02 LAB — GLUCOSE, POCT (MANUAL RESULT ENTRY): POC Glucose: 130 mg/dl — AB (ref 70–99)

## 2022-05-02 MED ORDER — BENZONATATE 200 MG PO CAPS: 200.0000 mg | ORAL_CAPSULE | Freq: Two times a day (BID) | ORAL | 0 refills | Status: DC | PRN

## 2022-05-02 MED ORDER — PROMETHAZINE HCL 25 MG PO TABS: 25.0000 mg | ORAL_TABLET | Freq: Three times a day (TID) | ORAL | 0 refills | Status: DC | PRN

## 2022-05-02 NOTE — Progress Notes (Signed)
Patient ID: [name], female   DOB: 1966-09-09, 56 y.o.   MRN: [id number]   Cicely Ortner, is a 56 y.o. female  ACZ:[id number]  [id number]  DOB - [birth year]  Chief Complaint  Patient presents with   Cough   Nausea       Subjective:   [name] is a 56 y.o. female here today for acute onset of cough, scratchy throat, N/V last night.  No fever.  No diarrhea.  Some ear pressure.  No difficulty breathing.  Cough is non-productive.  She has had covid vaccines.  No known exposures  No problems updated.  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 Claritin   Vertigo     MEDICATIONS AT HOME: Prior to Admission medications   Medication Sig Start Date End Date Taking? Authorizing Provider  benzonatate (TESSALON) 200 MG capsule Take 1 capsule (200 mg total) by mouth 2 (two) times daily as needed for cough. 05/02/22  Yes Tamla Winkels, Marzella Schlein, PA-C  promethazine (PHENERGAN) 25 MG tablet Take 1 tablet (25 mg total) by mouth every 8 (eight) hours as needed for nausea or vomiting. 05/02/22  Yes Sharon Seller, Marzella Schlein, PA-C  albuterol (VENTOLIN HFA) 108 (90 Base) MCG/ACT inhaler Inhale 2 puffs into the lungs every 6 (six) hours as needed for wheezing or shortness of breath. 06/01/20   Marcine Matar, MD  amLODipine (NORVASC) 10 MG tablet Take 1 tablet (10 mg total) by mouth daily. 10/12/21   Marcine Matar, MD  Blood Pressure Monitor DEVI Use as directed to  check home blood pressure 2-3 times a week 03/10/19   Marcine Matar, MD  cetirizine (ZYRTEC) 10 MG tablet Take 1 tablet (10 mg total) by mouth daily. 11/14/21 12/14/21  Virl Diamond A, MD  diclofenac sodium (VOLTAREN) 1 % GEL Apply 2 g topically 4 (four) times daily. 04/01/19   Aviva Kluver B, PA-C  diphenhydrAMINE (BENADRYL) 25 MG tablet Take 1 tablet (25 mg total) by mouth every 6 (six) hours as needed. 04/18/20   Harlene Salts A, PA-C  DULoxetine (CYMBALTA) 30 MG capsule Take 1 capsule by mouth at bedtime.    [provider]  EPINEPHrine (EPIPEN 2-PAK) 0.3 mg/0.3 mL IJ SOAJ injection Inject 0.3 mLs (0.3 mg total) into the muscle as needed for anaphylaxis. 04/18/20   Harlene Salts A, PA-C  fluticasone (FLONASE) 50 MCG/ACT nasal spray Place 1 spray into both nostrils daily. 02/02/21   Marcine Matar, MD  fluticasone (FLONASE) 50 MCG/ACT nasal spray Place 1 spray into both nostrils daily. 11/14/21 12/14/21  Tomma Lightning, MD  gabapentin (NEURONTIN) 300 MG capsule Take 1 capsule (300 mg total) by mouth at bedtime. 07/05/20   Marcine Matar, MD  LINZESS 290 MCG CAPS capsule TAKE 1 CAPSULE(290 MCG) BY MOUTH DAILY BEFORE BREAKFAST 12/02/21   Marcine Matar, MD  meclizine (ANTIVERT) 25 MG tablet TAKE 1 TABLET(25 MG)  BY MOUTH TWICE DAILY AS NEEDED FOR DIZZINESS 07/10/21   Marcine Matar, MD  metFORMIN (GLUCOPHAGE) 500 MG tablet Take 1 tablet (500 mg total) by mouth daily with breakfast. 02/14/22   Marcine Matar, MD  montelukast (SINGULAIR) 10 MG tablet Take 1 tablet (10 mg total) by mouth at bedtime. 11/14/21   [place], Onnie Boer A, MD  omeprazole (PRILOSEC) 20 MG capsule TAKE 1 CAPSULE(20 MG) BY MOUTH TWICE DAILY BEFORE A MEAL 04/19/22   Marcine Matar, MD  oxycodone (OXY-IR) 5 MG capsule Take 5 mg by mouth every 8 (eight) hours as needed.    [provider]  rosuvastatin (CRESTOR) 20 MG tablet TAKE 1 TABLET(20 MG) BY MOUTH DAILY 04/09/22   Marcine Matar, MD   spironolactone (ALDACTONE) 25 MG tablet TAKE 1/2 TABLET(12.5 MG) BY MOUTH DAILY 04/19/22   Marcine Matar, MD    ROS: Neg cardiac Neg GU Neg MS Neg psych Neg neuro  Objective:   Vitals:   05/02/22 1006  BP: 106/72  Pulse: (!) 106  SpO2: 96%  Weight: 142 lb 6.4 oz (64.6 kg)   Exam General appearance : Awake, alert, not in any distress. Speech Clear. Not toxic looking HEENT: Atraumatic and Normocephalic.  B TM congested.  Throat with mild erythema and PND without exudate.   Neck: Supple, no JVD. No cervical lymphadenopathy.  Chest: Good air entry bilaterally, CTAB.  No rales/rhonchi/wheezing CVS: S1 S2 regular, no murmurs.  Extremities: B/L Lower Ext shows no edema, both legs are warm to touch Neurology: Awake alert, and oriented X 3, CN II-XII intact, Non focal Skin: No Rash  Data Review Lab Results  Component Value Date   HGBA1C 6.6 (H) 02/12/2022   HGBA1C 6.2 (H) 02/06/2021    Assessment & Plan   1. Type 2 diabetes mellitus with hyperglycemia, without long-term current use of insulin (HCC) Blood sugar is good today - Glucose (CBG)  2. Nausea and vomiting, unspecified vomiting type Definite viral syndrome-possible Covid - Novel Coronavirus, NAA ([facility]) - promethazine (PHENERGAN) 25 MG tablet; Take 1 tablet (25 mg total) by mouth every 8 (eight) hours as needed for nausea or vomiting.  Dispense: 20 tablet; Refill: 0  3. Acute cough - Novel Coronavirus, NAA ([facility]) - benzonatate (TESSALON) 200 MG capsule; Take 1 capsule (200 mg total) by mouth 2 (two) times daily as needed for cough.  Dispense: 20 capsule; Refill: 0  Return for keep next appt with dr Laural Benes in august.  The patient was given clear instructions to go to ER or return to medical center if symptoms don't improve, worsen or new problems develop. The patient verbalized understanding. The patient was told to call to get lab results if they haven't heard anything in the next week.      [place]ian Co, PA-C [facility] and [facility] [place], [place] [phone]   05/02/2022, 10:23 AM

## 2022-05-03 LAB — NOVEL CORONAVIRUS, NAA: SARS-CoV-2, NAA: DETECTED — AB

## 2022-05-15 ENCOUNTER — Other Ambulatory Visit: Payer: Self-pay | Admitting: Internal Medicine

## 2022-05-15 DIAGNOSIS — I10 Essential (primary) hypertension: Secondary | ICD-10-CM

## 2022-05-18 ENCOUNTER — Other Ambulatory Visit: Payer: Self-pay | Admitting: Internal Medicine

## 2022-05-18 DIAGNOSIS — K5909 Other constipation: Secondary | ICD-10-CM

## 2022-05-18 NOTE — Telephone Encounter (Signed)
Requested Prescriptions  Pending Prescriptions Disp Refills  . LINZESS 290 MCG CAPS capsule [Pharmacy Med Name: [name] CAPSULES] 90 capsule 0    Sig: TAKE 1 CAPSULE(290 MCG) BY MOUTH DAILY BEFORE BREAKFAST     Gastroenterology: Irritable Bowel Syndrome Passed - 05/18/2022 10:19 AM      Passed - Valid encounter within last 12 months    Recent Outpatient Visits          2 weeks ago Type 2 diabetes mellitus with hyperglycemia, without long-term current use of insulin [facility])   [place] [facility] [place], [place], [place]   3 months ago Essential hypertension   [place] [facility] Marcine Matar, MD   7 months ago Essential hypertension   [place] [facility] Marcine Matar, MD   11 months ago Right lower quadrant abdominal pain   [place] [facility] Marcine Matar, MD   1 year ago Right lower quadrant abdominal pain    [facility] Marcine Matar, MD      Future Appointments            In 3 weeks Marcine Matar, MD [facility]

## 2022-05-20 ENCOUNTER — Other Ambulatory Visit: Payer: Self-pay | Admitting: Pulmonary Disease

## 2022-05-30 DIAGNOSIS — M5136 Other intervertebral disc degeneration, lumbar region: Secondary | ICD-10-CM | POA: Diagnosis not present

## 2022-05-30 DIAGNOSIS — M4726 Other spondylosis with radiculopathy, lumbar region: Secondary | ICD-10-CM | POA: Diagnosis not present

## 2022-06-14 ENCOUNTER — Encounter: Payer: Self-pay | Admitting: Internal Medicine

## 2022-06-14 ENCOUNTER — Ambulatory Visit: Payer: Medicare Other | Attending: Internal Medicine | Admitting: Internal Medicine

## 2022-06-14 VITALS — BP 122/84 | HR 86 | Temp 98.1°F | Ht 64.0 in | Wt 141.4 lb

## 2022-06-14 DIAGNOSIS — E119 Type 2 diabetes mellitus without complications: Secondary | ICD-10-CM

## 2022-06-14 DIAGNOSIS — I10 Essential (primary) hypertension: Secondary | ICD-10-CM | POA: Diagnosis not present

## 2022-06-14 DIAGNOSIS — R5383 Other fatigue: Secondary | ICD-10-CM | POA: Diagnosis not present

## 2022-06-14 DIAGNOSIS — E782 Mixed hyperlipidemia: Secondary | ICD-10-CM | POA: Diagnosis not present

## 2022-06-14 LAB — POCT GLYCOSYLATED HEMOGLOBIN (HGB A1C): HbA1c, POC (controlled diabetic range): 6.2 % (ref 0.0–7.0)

## 2022-06-14 LAB — GLUCOSE, POCT (MANUAL RESULT ENTRY): POC Glucose: 115 mg/dl — AB (ref 70–99)

## 2022-06-14 MED ORDER — FLUTICASONE PROPIONATE 50 MCG/ACT NA SUSP: 1.0000 | Freq: Every day | NASAL | 3 refills | Status: DC

## 2022-06-14 MED ORDER — ALBUTEROL SULFATE HFA 108 (90 BASE) MCG/ACT IN AERS
2.0000 | INHALATION_SPRAY | Freq: Four times a day (QID) | RESPIRATORY_TRACT | 1 refills | Status: DC | PRN
Start: 2022-06-14 — End: 2023-02-11

## 2022-06-14 NOTE — Addendum Note (Signed)
Addended by: Jonah Blue B on: 06/14/2022 01:15 PM   Modules accepted: Orders

## 2022-06-14 NOTE — Progress Notes (Signed)
Patient ID: [name], female    DOB: 27-Jan-1966  MRN: [id number]  CC: Chronic disease management  Subjective: [name] is a 56 y.o. female who presents for chronic disease management Her concerns today include:  Pt with hx of HTN, chronic constipation, anal fistulas, vertigo, HL, chronic back pain (followed by pain specialist), hot flashes (on gabapentin) and GERD  Patient was seen by PA 05/02/2022 for upper respiratory symptoms.  Diagnosed with COVID.  Still has some lingering fatigue since then.  HTN: Reports compliance with taking Norvasc 10 mg and spironolactone 12.5 mg daily.  She limits salt in the foods.  No device to check blood pressure.  No chest pains.  No edema in the lower extremities.  DM type II: Results for orders placed or performed in visit on 06/14/22  POCT glucose (manual entry)  Result Value Ref Range   POC Glucose 115 (A) 70 - 99 mg/dl  POCT glycosylated hemoglobin (Hb A1C)  Result Value Ref Range   Hemoglobin A1C     HbA1c POC (<> result, manual entry)     HbA1c, POC (prediabetic range)     HbA1c, POC (controlled diabetic range) 6.2 0.0 - 7.0 %  Since being diagnosed with diabetes, she has done of 360 with her eating habits.  Cut back on portion sizes.  Eating more fruits and vegetables.  Not getting in as much exercise as she should.  He feels tired from having had COVID.  She has lost about 15 pounds since being diagnosed with diabetes in April. Due for diabetic eye exam.  Agreeable to referral.  HL: Taking and tolerating Crestor.  Requests refill on Flonase and albuterol.   Patient Active Problem List   Diagnosis Date Noted   New onset type 2 diabetes mellitus (HCC) 02/14/2022   Altered consciousness 12/10/2018   Carpal tunnel syndrome of right wrist 06/12/2018   Postnasal drip 06/12/2018   Vertigo 10/18/2016   Disturbance of skin sensation 04/19/2014   Anal fistula 04/14/2013   UNSPECIFIED SITE OF SPRAIN AND STRAIN 07/07/2009   PEDAL EDEMA  06/29/2009   MUSCLE PAIN 06/23/2009   ANEMIA-IRON DEFICIENCY 03/11/2009   Allergic rhinitis 03/11/2009   SYNCOPE, VASOVAGAL 03/11/2009   HEADACHE 03/11/2009     Current Outpatient Medications on File Prior to Visit  Medication Sig Dispense Refill   albuterol (VENTOLIN HFA) 108 (90 Base) MCG/ACT inhaler Inhale 2 puffs into the lungs every 6 (six) hours as needed for wheezing or shortness of breath. 6.7 g 1   amLODipine (NORVASC) 10 MG tablet TAKE 1 TABLET(10 MG) BY MOUTH DAILY 90 tablet 1   benzonatate (TESSALON) 200 MG capsule Take 1 capsule (200 mg total) by mouth 2 (two) times daily as needed for cough. 20 capsule 0   Blood Pressure Monitor DEVI Use as directed to check home blood pressure 2-3 times a week 1 Device 0   diclofenac sodium (VOLTAREN) 1 % GEL Apply 2 g topically 4 (four) times daily. 100 g 0   diphenhydrAMINE (BENADRYL) 25 MG tablet Take 1 tablet (25 mg total) by mouth every 6 (six) hours as needed. 20 tablet 0   DULoxetine (CYMBALTA) 30 MG capsule Take 1 capsule by mouth at bedtime.     EPINEPHrine (EPIPEN 2-PAK) 0.3 mg/0.3 mL IJ SOAJ injection Inject 0.3 mLs (0.3 mg total) into the muscle as needed for anaphylaxis. 1 each 0   fluticasone (FLONASE) 50 MCG/ACT nasal spray Place 1 spray into both nostrils daily. 16 g 2  gabapentin (NEURONTIN) 300 MG capsule Take 1 capsule (300 mg total) by mouth at bedtime. 30 capsule 3   LINZESS 290 MCG CAPS capsule TAKE 1 CAPSULE(290 MCG) BY MOUTH DAILY BEFORE BREAKFAST 90 capsule 0   meclizine (ANTIVERT) 25 MG tablet TAKE 1 TABLET(25 MG) BY MOUTH TWICE DAILY AS NEEDED FOR DIZZINESS 20 tablet 0   metFORMIN (GLUCOPHAGE) 500 MG tablet Take 1 tablet (500 mg total) by mouth daily with breakfast. 90 tablet 1   montelukast (SINGULAIR) 10 MG tablet Take 1 tablet (10 mg total) by mouth at bedtime. 30 tablet 5   omeprazole (PRILOSEC) 20 MG capsule TAKE 1 CAPSULE(20 MG) BY MOUTH TWICE DAILY BEFORE A MEAL 180 capsule 0   oxycodone (OXY-IR) 5 MG capsule  Take 5 mg by mouth every 8 (eight) hours as needed.     promethazine (PHENERGAN) 25 MG tablet Take 1 tablet (25 mg total) by mouth every 8 (eight) hours as needed for nausea or vomiting. 20 tablet 0   rosuvastatin (CRESTOR) 20 MG tablet TAKE 1 TABLET(20 MG) BY MOUTH DAILY 30 tablet 2   spironolactone (ALDACTONE) 25 MG tablet TAKE 1/2 TABLET(12.5 MG) BY MOUTH DAILY 34 tablet 1   cetirizine (ZYRTEC) 10 MG tablet Take 1 tablet (10 mg total) by mouth daily. 30 tablet 5   fluticasone (FLONASE) 50 MCG/ACT nasal spray Place 1 spray into both nostrils daily. 16 g 3   No current facility-administered medications on file prior to visit.    Allergies  Allergen Reactions   Ampicillin Anaphylaxis   Atorvastatin Calcium Other (See Comments)    Pt reported lip swelling   Penicillins Anaphylaxis   Sulfonamide Derivatives Anaphylaxis   Hydrocodone-Acetaminophen Hives    Have to take a Benadryl to take med   Tramadol Itching   Tylenol [Acetaminophen] Itching    Social History   Socioeconomic History   Marital status: Single    Spouse name: Not on file   Number of children: 2   Years of education: 10   Highest education level: Not on file  Occupational History   Occupation: N/A  Tobacco Use   Smoking status: Never   Smokeless tobacco: Never  Vaping Use   Vaping Use: Never used  Substance and Sexual Activity   Alcohol use: Never    Comment: occa   Drug use: No   Sexual activity: Yes    Birth control/protection: Pill  Other Topics Concern   Not on file  Social History Narrative   Lives at home w/ her mother   Right-handed   Caffeine: 1 cup of tea per day   Social Determinants of Health   Financial Resource Strain: Not on file  Food Insecurity: Not on file  Transportation Needs: Not on file  Physical Activity: Not on file  Stress: Not on file  Social Connections: Not on file  Intimate Partner Violence: Not on file    Family History  Problem Relation Age of Onset   Diabetes  Mother    Hypertension Mother    Heart attack Father    Seizures Sister    Seizures Sister    Diabetes Brother    Hypertension Brother    Diabetes Maternal Aunt    Diabetes Maternal Uncle    Hypertension Maternal Uncle    Hypertension Son    Colon cancer Neg Hx    Stomach cancer Neg Hx     Past Surgical History:  Procedure Laterality Date   ANAL FISTULECTOMY  08/22/2012   Procedure: FISTULECTOMY  ANAL;  Surgeon: Lodema Pilot, DO;  Location: [facility];  Service: General;  Laterality: N/A;  rectal examination under anesthesia with seton placement    ANAL FISTULECTOMY  09/17/2012   Procedure: FISTULECTOMY ANAL;  Surgeon: Lodema Pilot, DO;  Location: [facility];  Service: General;  Laterality: N/A;  possible fistulotomy   D & C HYSTEROSCOPY/ NOVASURE ENDOMETRIAL ABLATION/ I & D THROMBOSED HEMORROID  01-10-2007  DR Isaias Cowman ROSS   EXAMINATION UNDER ANESTHESIA  09/17/2012   Procedure: EXAM UNDER ANESTHESIA;  Surgeon: Lodema Pilot, DO;  Location: [facility];  Service: General;  Laterality: N/A;  Rectal exam under anesthesia   EXAMINATION UNDER ANESTHESIA N/A 05/14/2013   Procedure: EXAM UNDER ANESTHESIA;  Surgeon: Romie Levee, MD;  Location: [facility];  Service: General;  Laterality: N/A;   FISTULA PLUG  09/17/2012   Procedure: FISTULA PLUG;  Surgeon: Lodema Pilot, DO;  Location: [facility];  Service: General;  Laterality: N/A;  Possible fistula plug   HEMORRHOID SURGERY  04-28-2009   [facility] INTERNAL HEMORRHOIDS   IRRIGATION AND DEBRIDEMENT ABSCESS  07/08/2012   Procedure: MINOR INCISION AND DRAINAGE OF ABSCESS;  Surgeon: Geryl Rankins, MD;  Location[facility];  Service: Gynecology;  Laterality: N/A;  Vulvar Abscess   PLACEMENT OF SETON N/A 05/14/2013   Procedure:  PLACEMENT OF SETON;  Surgeon: Romie Levee, MD;  Location: [facility];  Service: General;  Laterality: N/A;   RECTAL EXAM UNDER ANESTHESIA N/A  08/20/2013   Procedure: mucosal advancement flap ;  Surgeon: Romie Levee, MD;  Location: [facility];  Service: General;  Laterality: N/A;  parks anal retractor long rectal instrucments prone jack knife anal fistula    RECTAL ULTRASOUND N/A 07/20/2013   Procedure: RECTAL ULTRASOUND;  Surgeon: Romie Levee, MD;  Location: [facility];  Service: Endoscopy;  Laterality: N/A;    ROS: Review of Systems Negative except as stated above  PHYSICAL EXAM: BP 122/84   Pulse 86   Temp 98.1 F (36.7 C) (Oral)   Ht 5\' 4"  (1.626 m)   Wt 141 lb 6.4 oz (64.1 kg)   SpO2 96%   BMI 24.27 kg/m   Wt Readings from Last 3 Encounters:  06/14/22 141 lb 6.4 oz (64.1 kg)  05/02/22 142 lb 6.4 oz (64.6 kg)  02/12/22 156 lb 6.4 oz (70.9 kg)    Physical Exam  General appearance - alert, well appearing, middle-aged older African-American female and in no distress Mental status - normal mood, behavior, speech, dress, motor activity, and thought processes Eyes -slightly pale conjunctiva Neck - supple, no significant adenopathy Chest - clear to auscultation, no wheezes, rales or rhonchi, symmetric air entry Heart - normal rate, regular rhythm, normal S1, S2, no murmurs, rubs, clicks or gallops Extremities - peripheral pulses normal, no pedal edema, no clubbing or cyanosis      Latest Ref Rng & Units 02/12/2022    9:37 AM 06/13/2021    4:11 PM 02/06/2021    4:11 PM  CMP  Glucose 70 - 99 mg/dL 04/08/2021  95  161   BUN 6 - 24 mg/dL 8  9  8    Creatinine 0.57 - 1.00 mg/dL 096   0.45   Sodium 134 - 144 mmol/L 144  145  142   Potassium 3.5 - 5.2 mmol/L 4.2  4.1  4.1   Chloride 96 - 106 mmol/L 102  102  101   CO2 20 - 29 mmol/L 26  24  26   Calcium 8.7 - 10.2 mg/dL 9.4  82.4  9.3   Total Protein 6.0 - 8.5 g/dL 7.3   6.8   Total Bilirubin 0.0 - 1.2 mg/dL 0.3   0.2   Alkaline Phos 44 - 121 IU/L 84   76   AST 0 - 40 IU/L 17   21   ALT 0 - 32 IU/L 17   20    Lipid Panel     Component Value Date/Time   CHOL 170  02/12/2022 0937   TRIG 107 02/12/2022 0937   HDL 58 02/12/2022 0937   CHOLHDL 2.9 02/12/2022 0937   CHOLHDL 3.8 03/19/2009 0414   VLDL 13 03/19/2009 0414   LDLCALC 93 02/12/2022 0937    CBC    Component Value Date/Time   WBC 5.3 02/12/2022 0937   WBC 5.5 04/01/2019 1054   RBC 4.31 02/12/2022 0937   RBC 4.34 04/01/2019 1054   HGB 12.4 02/12/2022 0937   HGB 13.8 09/05/2009 1347   HCT 37.5 02/12/2022 0937   HCT 41.3 09/05/2009 1347   PLT 312 02/12/2022 0937   MCV 87 02/12/2022 0937   MCV 92.0 09/05/2009 1347   MCH 28.8 02/12/2022 0937   MCH 29.5 04/01/2019 1054   MCHC 33.1 02/12/2022 0937   MCHC 32.3 04/01/2019 1054   RDW 13.4 02/12/2022 0937   RDW 13.0 09/05/2009 1347   LYMPHSABS 2.2 04/01/2019 1054   LYMPHSABS 2.9 03/16/2019 1427   LYMPHSABS 2.1 09/05/2009 1347   MONOABS 0.4 04/01/2019 1054   MONOABS 0.3 09/05/2009 1347   EOSABS 0.1 04/01/2019 1054   EOSABS 0.1 03/16/2019 1427   BASOSABS 0.1 04/01/2019 1054   BASOSABS 0.1 03/16/2019 1427   BASOSABS 0.1 09/05/2009 1347    ASSESSMENT AND PLAN:  1. New onset type 2 diabetes mellitus (HCC) At goal.  Commended her on changes in her eating habits and weight loss.  Encouraged her to keep up the good works.  Advised to incorporate exercise with goal of getting in about 30 minutes 5 days a week of moderate intensity exercise. - POCT glucose (manual entry) - POCT glycosylated hemoglobin (Hb A1C) - Basic metabolic panel  2. Essential hypertension Close to goal.  Continue amlodipine and spironolactone.  3. Mixed hyperlipidemia Continue Crestor  4. Other fatigue Advised that sometimes the fatigue from COVID can last weeks.  She has a little bit of pale conjunctiva today so we will recheck CBC. - CBC - TSH    Patient was given the opportunity to ask questions.  Patient verbalized understanding of the plan and was able to repeat key elements of the plan.   This documentation was completed using Public librarian.  Any transcriptional errors are unintentional.  Orders Placed This Encounter  Procedures   CBC   Basic metabolic panel   TSH   POCT glucose (manual entry)   POCT glycosylated hemoglobin (Hb A1C)     Requested Prescriptions    No prescriptions requested or ordered in this encounter    Return in about 4 months (around 10/14/2022).  Jonah Blue, MD, FACP

## 2022-06-14 NOTE — Progress Notes (Signed)
Pt request Rf on Flonase 50 MCG/ACT Pt wants to discuss G7 dexcom.

## 2022-06-15 LAB — CBC
Hematocrit: 42.9 % (ref 34.0–46.6)
Hemoglobin: 13.9 g/dL (ref 11.1–15.9)
MCH: 28.9 pg (ref 26.6–33.0)
MCHC: 32.4 g/dL (ref 31.5–35.7)
MCV: 89 fL (ref 79–97)
Platelets: 344 10*3/uL (ref 150–450)
RBC: 4.81 x10E6/uL (ref 3.77–5.28)
RDW: 13 % (ref 11.7–15.4)
WBC: 5.5 10*3/uL (ref 3.4–10.8)

## 2022-06-15 LAB — BASIC METABOLIC PANEL
BUN/Creatinine Ratio: 11 (ref 9–23)
BUN: 8 mg/dL (ref 6–24)
CO2: 26 mmol/L (ref 20–29)
Calcium: 10 mg/dL (ref 8.7–10.2)
Chloride: 99 mmol/L (ref 96–106)
Creatinine, Ser: 0.75 mg/dL (ref 0.57–1.00)
Glucose: 91 mg/dL (ref 70–99)
Potassium: 4.5 mmol/L (ref 3.5–5.2)
Sodium: 141 mmol/L (ref 134–144)
eGFR: 93 mL/min/{1.73_m2} (ref >59)

## 2022-06-15 LAB — TSH: TSH: 1.02 u[IU]/mL (ref 0.450–4.500)

## 2022-06-20 DIAGNOSIS — M5136 Other intervertebral disc degeneration, lumbar region: Secondary | ICD-10-CM | POA: Diagnosis not present

## 2022-06-20 DIAGNOSIS — Z79891 Long term (current) use of opiate analgesic: Secondary | ICD-10-CM | POA: Diagnosis not present

## 2022-06-20 DIAGNOSIS — M5416 Radiculopathy, lumbar region: Secondary | ICD-10-CM | POA: Diagnosis not present

## 2022-06-28 ENCOUNTER — Telehealth: Payer: Self-pay | Admitting: Emergency Medicine

## 2022-06-28 ENCOUNTER — Other Ambulatory Visit: Payer: Self-pay | Admitting: Internal Medicine

## 2022-06-28 DIAGNOSIS — R0982 Postnasal drip: Secondary | ICD-10-CM

## 2022-06-28 NOTE — Telephone Encounter (Signed)
Requested medication (s) are due for refill today - no  Requested medication (s) are on the active medication list -yes  Future visit scheduled -yes  Last refill: 05/02/22 #20  Notes to clinic: Sent for review- listed on Rx request as discontinued   Requested Prescriptions  Pending Prescriptions Disp Refills   benzonatate (TESSALON) 100 MG capsule [Pharmacy Med Name: BENZONATATE 100MG  CAPSULES] 30 capsule 0    Sig: TAKE 1 CAPSULE(100 MG) BY MOUTH TWICE DAILY AS NEEDED FOR COUGH     Ear, Nose, and Throat:  Antitussives/Expectorants Passed - 06/28/2022  8:33 AM      Passed - Valid encounter within last 12 months    Recent Outpatient Visits           2 weeks ago New onset type 2 diabetes mellitus (HCC)   [place] [facility] [place], [place] B, MD   1 month ago Type 2 diabetes mellitus with hyperglycemia, without long-term current use of insulin [facility])   [place] [facility] [place], [place], [place]   4 months ago Essential hypertension   [place] [facility] [place], MD   8 months ago Essential hypertension   [place] [facility] [place] Matar, MD   1 year ago Right lower quadrant abdominal pain   [place] [facility] [place] Matar, MD       Future Appointments             In 3 months Marcine Matar, Laural Benes, MD [facility]               Requested Prescriptions  Pending Prescriptions Disp Refills   benzonatate (TESSALON) 100 MG capsule [Pharmacy Med Name: BENZONATATE 100MG  CAPSULES] 30 capsule 0    Sig: TAKE 1 CAPSULE(100 MG) BY MOUTH TWICE DAILY AS NEEDED FOR COUGH     Ear, Nose, and Throat:  Antitussives/Expectorants Passed - 06/28/2022  8:33 AM      Passed - Valid encounter within last 12 months    Recent Outpatient Visits           2 weeks ago New onset type 2 diabetes mellitus (HCC)   [place] [facility] [place], 06/30/2022 B, MD   1 month ago Type 2 diabetes mellitus with hyperglycemia, without long-term current use of insulin [facility])   [place] [facility] [place], [place], [place]   4 months ago Essential hypertension   [place] [facility] [place], MD   8 months ago Essential hypertension   [place] [facility] [place], MD   1 year ago Right lower quadrant abdominal pain    [facility] Marcine Matar, MD       Future Appointments             In 3 months Marcine Matar, Marcine Matar, MD [facility]

## 2022-06-28 NOTE — Telephone Encounter (Signed)
Copied from CRM [id number]. Topic: General - Inquiry >> Jun 28, 2022  8:08 AM De Blanch wrote: Reason for CRM: Pt stated she heard on the news she should get the new COVID vaccine as well as the flu shot together.  Pt seeking advice wants to know if we are going to be offering the new COVID vaccine and when can she get it. I advised pt at this time we are not scheduling for the flu shot just yet.   Please advise.

## 2022-06-30 ENCOUNTER — Other Ambulatory Visit: Payer: Self-pay | Admitting: Physician Assistant

## 2022-06-30 DIAGNOSIS — R112 Nausea with vomiting, unspecified: Secondary | ICD-10-CM

## 2022-07-02 ENCOUNTER — Other Ambulatory Visit: Payer: Self-pay | Admitting: Internal Medicine

## 2022-07-02 DIAGNOSIS — E782 Mixed hyperlipidemia: Secondary | ICD-10-CM

## 2022-07-02 NOTE — Telephone Encounter (Signed)
Requested medication (s) are due for refill today - yes  Requested medication (s) are on the active medication list -yes  Future visit scheduled -yes  Last refill: 05/02/22 #20  Notes to clinic: non delegated Rx  Requested Prescriptions  Pending Prescriptions Disp Refills   promethazine (PHENERGAN) 25 MG tablet [Pharmacy Med Name: PROMETHAZINE 25MG  TABLETS] 20 tablet 0    Sig: TAKE 1 TABLET(25 MG) BY MOUTH EVERY 8 HOURS AS NEEDED FOR NAUSEA OR VOMITING     Not Delegated - Gastroenterology: Antiemetics Failed - 07/02/2022  8:08 AM      Failed - This refill cannot be delegated      Passed - Valid encounter within last 6 months    Recent Outpatient Visits           2 weeks ago New onset type 2 diabetes mellitus (HCC)   [place] [facility] [place], [place] B, MD   2 months ago Type 2 diabetes mellitus with hyperglycemia, without long-term current use of insulin [facility])   [place] [facility] [place], [place], [place]   4 months ago Essential hypertension   [place] [facility] [place], MD   8 months ago Essential hypertension   [place] [facility] Marcine Matar, MD   1 year ago Right lower quadrant abdominal pain   [place] [facility] Marcine Matar, MD       Future Appointments             In 3 months Marcine Matar, Laural Benes, MD [facility]               Requested Prescriptions  Pending Prescriptions Disp Refills   promethazine (PHENERGAN) 25 MG tablet [Pharmacy Med Name: PROMETHAZINE 25MG  TABLETS] 20 tablet 0    Sig: TAKE 1 TABLET(25 MG) BY MOUTH EVERY 8 HOURS AS NEEDED FOR NAUSEA OR VOMITING     Not Delegated - Gastroenterology: Antiemetics Failed - 07/02/2022  8:08 AM      Failed - This refill cannot be delegated      Passed - Valid encounter within last 6 months    Recent Outpatient Visits           2 weeks ago  New onset type 2 diabetes mellitus (HCC)   [place] [facility] [place], 07/04/2022 B, MD   2 months ago Type 2 diabetes mellitus with hyperglycemia, without long-term current use of insulin [facility])   [facility] [place], [place], [place]   4 months ago Essential hypertension   [place] [facility] [place], MD   8 months ago Essential hypertension   [place] [facility] [place], MD   1 year ago Right lower quadrant abdominal pain    [facility] Marcine Matar, MD       Future Appointments             In 3 months Marcine Matar, Marcine Matar, MD [facility]

## 2022-07-02 NOTE — Telephone Encounter (Signed)
Pt states pharmacy has already submitted Saturday. Pt wanting med for gagging symptoms with prior covid, FU [phone] Medication Refill - Medication: promethazine (PHENERGAN) 25 MG tablet  Has the patient contacted their pharmacy? Yes.   This encounter (Agent: If no, request that the patient contact the pharmacy for the refill. If patient does not wish to contact the pharmacy document the reason why and proceed with request.) (Agent: If yes, when and what did the pharmacy advise?) call dr  Preferred Pharmacy (with phone number or street name):  [id number] - [place], [place] - [address] AT [facility] OF [address]  [address] [place] [facility] [address]  Phone: [phone] Fax: [phone]  Hours: Open 24 hour   Has the patient been seen for an appointment in the last year OR does the patient have an upcoming appointment? Yes.   06/14/22  Agent: Please be advised that RX refills may take up to 3 business days. We ask that you follow-up with your pharmacy.

## 2022-07-02 NOTE — Telephone Encounter (Signed)
Requested medication (s) are due for refill today - yes  Requested medication (s) are on the active medication list -yes  Future visit scheduled -yes  Last refill: 04/29/22 #20  Notes to clinic: non delegated Rx- duplicate request   Requested Prescriptions  Pending Prescriptions Disp Refills   promethazine (PHENERGAN) 25 MG tablet [Pharmacy Med Name: PROMETHAZINE 25MG  TABLETS] 20 tablet 0    Sig: TAKE 1 TABLET(25 MG) BY MOUTH EVERY 8 HOURS AS NEEDED FOR NAUSEA OR VOMITING     Not Delegated - Gastroenterology: Antiemetics Failed - 07/02/2022  8:08 AM      Failed - This refill cannot be delegated      Passed - Valid encounter within last 6 months    Recent Outpatient Visits           2 weeks ago New onset type 2 diabetes mellitus (HCC)   [place] [facility] [place], [place] B, MD   2 months ago Type 2 diabetes mellitus with hyperglycemia, without long-term current use of insulin [facility])   [place] [facility] [place], [place], [place]   4 months ago Essential hypertension   [place] [facility] [place], MD   8 months ago Essential hypertension   [place] [facility] Marcine Matar, MD   1 year ago Right lower quadrant abdominal pain   [place] [facility] Marcine Matar, MD       Future Appointments             In 3 months Marcine Matar, Laural Benes, MD [facility]               Requested Prescriptions  Pending Prescriptions Disp Refills   promethazine (PHENERGAN) 25 MG tablet [Pharmacy Med Name: PROMETHAZINE 25MG  TABLETS] 20 tablet 0    Sig: TAKE 1 TABLET(25 MG) BY MOUTH EVERY 8 HOURS AS NEEDED FOR NAUSEA OR VOMITING     Not Delegated - Gastroenterology: Antiemetics Failed - 07/02/2022  8:08 AM      Failed - This refill cannot be delegated      Passed - Valid encounter within last 6 months    Recent Outpatient Visits            2 weeks ago New onset type 2 diabetes mellitus (HCC)   [place] [facility] [place], 07/04/2022 B, MD   2 months ago Type 2 diabetes mellitus with hyperglycemia, without long-term current use of insulin [facility])   [facility] [place], [place], [place]   4 months ago Essential hypertension   [place] [facility] [place], [place]   8 months ago Essential hypertension   [place] [facility] [place], [place]   1 year ago Right lower quadrant abdominal pain    [facility] Marcine Matar, MD       Future Appointments             In 3 months Marcine Matar, Marcine Matar, MD [facility]

## 2022-07-03 NOTE — Telephone Encounter (Signed)
Requested Prescriptions  Pending Prescriptions Disp Refills  . rosuvastatin (CRESTOR) 20 MG tablet [Pharmacy Med Name: ROSUVASTATIN 20MG  TABLETS] 30 tablet 2    Sig: TAKE 1 TABLET(20 MG) BY MOUTH DAILY     Cardiovascular:  Antilipid - Statins 2 Failed - 07/02/2022  3:06 PM      Failed - Lipid Panel in normal range within the last 12 months    Cholesterol, Total  Date Value Ref Range Status  02/12/2022 170 100 - 199 mg/dL Final   LDL Chol Calc (NIH)  Date Value Ref Range Status  02/12/2022 93 0 - 99 mg/dL Final   HDL  Date Value Ref Range Status  02/12/2022 58 >39 mg/dL Final   Triglycerides  Date Value Ref Range Status  02/12/2022 107 0 - 149 mg/dL Final         Passed - Cr in normal range and within 360 days    Creatinine, Ser  Date Value Ref Range Status  06/14/2022 0.75 0.57 - 1.00 mg/dL Final         Passed - Patient is not pregnant      Passed - Valid encounter within last 12 months    Recent Outpatient Visits          2 weeks ago New onset type 2 diabetes mellitus (HCC)   [place] [facility] [place], [place] B, MD   2 months ago Type 2 diabetes mellitus with hyperglycemia, without long-term current use of insulin [facility])   [facility] [place], [place], [place]   4 months ago Essential hypertension   [place] [facility] [place], MD   8 months ago Essential hypertension   [place] [facility] Marcine Matar, MD   1 year ago Right lower quadrant abdominal pain   [place] [facility] Marcine Matar, MD      Future Appointments            In 3 months Marcine Matar, Laural Benes, MD [facility]

## 2022-07-04 NOTE — Telephone Encounter (Signed)
Patient aware of message

## 2022-07-04 NOTE — Telephone Encounter (Signed)
Please advise.----DD,RMA  

## 2022-07-04 NOTE — Telephone Encounter (Signed)
LVM for pt to RTC. ----DD,RMA  

## 2022-07-12 ENCOUNTER — Other Ambulatory Visit: Payer: Self-pay | Admitting: Internal Medicine

## 2022-07-12 DIAGNOSIS — I10 Essential (primary) hypertension: Secondary | ICD-10-CM

## 2022-07-12 DIAGNOSIS — K219 Gastro-esophageal reflux disease without esophagitis: Secondary | ICD-10-CM

## 2022-07-17 ENCOUNTER — Ambulatory Visit: Payer: Self-pay | Admitting: *Deleted

## 2022-07-17 NOTE — Telephone Encounter (Signed)
Routing to CMA 

## 2022-07-17 NOTE — Telephone Encounter (Signed)
Summary: cough/ sore throat   Pt states she has had a constant dry cough x2d w/ a sore throat   Pt requesting to be see soon, provider has no availability   Advised pt about the mobile clinic, pt declined   Please assist pt further        Chief Complaint: reports covid positive 2-3 weeks ago and cough, sore throat has returned Symptoms: cough and coughing up clear "foam" at times. Mostly dry cough. Sore throat and runny nose. Taking tessalon as prescribed but not helping cough. "Don't feel good" Frequency: 2 days Pertinent Negatives: Patient denies difficulty breathing, no fever Disposition: [] ED /[] Urgent Care (no appt availability in office) / [] Appointment(In office/virtual)/ []  [facility]/ [] Home Care/ [] Refused Recommended Disposition /[x] [facility]/ [x]  Follow-up with PCP Additional Notes:   Patient would like to f/u with PCP due to she was just getting over covid and now some sx returned. Please advise no OV available. Recommended mobile bus. No My Chart available. Please advise       Reason for Disposition  Cough with cold symptoms (e.g., runny nose, postnasal drip, throat clearing)  Answer Assessment - Initial Assessment Questions 1. ONSET: "When did the cough begin?"      2 days ago  2. SEVERITY: "How bad is the cough today?"      Has returned since having covid 2-3 weeks ago  3. SPUTUM: "Describe the color of your sputum" (none, dry cough; clear, white, yellow, green)     Clear "foam" 4. HEMOPTYSIS: "Are you coughing up any blood?" If so ask: "How much?" (flecks, streaks, tablespoons, etc.)     na 5. DIFFICULTY BREATHING: "Are you having difficulty breathing?" If Yes, ask: "How bad is it?" (e.g., mild, moderate, severe)    - MILD: No SOB at rest, mild SOB with walking, speaks normally in sentences, can lie down, no retractions, pulse < 100.    - MODERATE: SOB at rest, SOB with minimal exertion and prefers to sit, cannot lie down flat, speaks  in phrases, mild retractions, audible wheezing, pulse 100-120.    - SEVERE: Very SOB at rest, speaks in single words, struggling to breathe, sitting hunched forward, retractions, pulse > 120      Denies  6. FEVER: "Do you have a fever?" If Yes, ask: "What is your temperature, how was it measured, and when did it start?"     na 7. CARDIAC HISTORY: "Do you have any history of heart disease?" (e.g., heart attack, congestive heart failure)      Na  8. LUNG HISTORY: "Do you have any history of lung disease?"  (e.g., pulmonary embolus, asthma, emphysema)     na 9. PE RISK FACTORS: "Do you have a history of blood clots?" (or: recent major surgery, recent prolonged travel, bedridden)     na 10. OTHER SYMPTOMS: "Do you have any other symptoms?" (e.g., runny nose, wheezing, chest pain)       Runny nose, sore throat cough  11. PREGNANCY: "Is there any chance you are pregnant?" "When was your last menstrual period?"       na 12. TRAVEL: "Have you traveled out of the country in the last month?" (e.g., travel history, exposures)       na  Protocols used: Cough - Acute Productive-A-AH

## 2022-07-18 NOTE — Telephone Encounter (Signed)
Please advise.----DD,RMA  

## 2022-07-20 NOTE — Telephone Encounter (Signed)
Pt has been given a telephone appointment with a provider to discuss.

## 2022-07-23 ENCOUNTER — Ambulatory Visit (HOSPITAL_BASED_OUTPATIENT_CLINIC_OR_DEPARTMENT_OTHER): Payer: Medicare Other | Admitting: Family Medicine

## 2022-07-23 ENCOUNTER — Encounter: Payer: Self-pay | Admitting: Family Medicine

## 2022-07-23 DIAGNOSIS — J3089 Other allergic rhinitis: Secondary | ICD-10-CM

## 2022-07-23 MED ORDER — CETIRIZINE HCL 10 MG PO TABS: 10.0000 mg | ORAL_TABLET | Freq: Every day | ORAL | 1 refills | Status: DC

## 2022-07-23 MED ORDER — PREDNISONE 20 MG PO TABS: 20.0000 mg | ORAL_TABLET | Freq: Every day | ORAL | 0 refills | Status: DC

## 2022-07-23 NOTE — Progress Notes (Signed)
Virtual Visit via Telephone Note  I connected with [name], on 07/23/2022 at 8:11 AM by telephone and verified that I am speaking with the correct person using two identifiers.   Consent: I discussed the limitations, risks, security and privacy concerns of performing an evaluation and management service by telephone and the availability of in person appointments. I also discussed with the patient that there may be a patient responsible charge related to this service. The patient expressed understanding and agreed to proceed.   Location of Patient: Home  Location of Provider: Clinic   Persons participating in Telemedicine visit: [name] Dr. Alvis Lemmings     History of Present Illness: [name] is a 56 y.o. year old female with a history of carpal tunnel syndrome, hyperlipidemia, seasonal allergies, hypertension seen for an acute visit.   For the last week she has been coughing, gagging and this is followed by throwing up clear stuff. Previously had sore throat which has resolved. Loratadine and Flonase have been ineffective.  In the past she was on Zyrtec which she said did help. Her symptoms are not limited to any particular time of the day and she has no sinus pressure but does have intermittent nasal congestion and postnasal drip.  Denies presence of fevers or myalgias. 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 Claritin   Vertigo    Allergies  Allergen Reactions   Ampicillin Anaphylaxis   Atorvastatin Calcium Other (See Comments)    Pt reported lip swelling   Penicillins Anaphylaxis   Sulfonamide Derivatives Anaphylaxis   Hydrocodone-Acetaminophen Hives    Have to take a Benadryl to take med   Tramadol Itching   Tylenol [Acetaminophen] Itching    Current Outpatient Medications on  File Prior to Visit  Medication Sig Dispense Refill   albuterol (VENTOLIN HFA) 108 (90 Base) MCG/ACT inhaler Inhale 2 puffs into the lungs every 6 (six) hours as needed for wheezing or shortness of breath. 6.7 g 1   amLODipine (NORVASC) 10 MG tablet TAKE 1 TABLET(10 MG) BY MOUTH DAILY 90 tablet 1   benzonatate (TESSALON) 100 MG capsule TAKE 1 CAPSULE(100 MG) BY MOUTH TWICE DAILY AS NEEDED FOR COUGH 30 capsule 0   Blood Pressure Monitor DEVI Use as directed to check home blood pressure 2-3 times a week 1 Device 0   cetirizine (ZYRTEC) 10 MG tablet Take 1 tablet (10 mg total) by mouth daily. 30 tablet 5   diclofenac sodium (VOLTAREN) 1 % GEL Apply 2 g topically 4 (four) times daily. 100 g 0   diphenhydrAMINE (BENADRYL) 25 MG tablet Take 1 tablet (25 mg total) by mouth every 6 (six) hours as needed. 20 tablet 0   DULoxetine (CYMBALTA) 30 MG capsule Take 1 capsule by mouth at bedtime.     EPINEPHrine (EPIPEN 2-PAK) 0.3 mg/0.3 mL IJ SOAJ injection Inject 0.3 mLs (0.3 mg total) into the muscle as needed for anaphylaxis. 1 each 0   fluticasone (FLONASE) 50 MCG/ACT nasal spray Place 1 spray into both nostrils daily. 16 g 2   fluticasone (FLONASE) 50 MCG/ACT nasal spray Place 1 spray into both nostrils daily. 16 g 3   gabapentin (NEURONTIN) 300 MG capsule Take 1 capsule (300 mg total) by mouth at bedtime. 30  capsule 3   LINZESS 290 MCG CAPS capsule TAKE 1 CAPSULE(290 MCG) BY MOUTH DAILY BEFORE BREAKFAST 90 capsule 0   meclizine (ANTIVERT) 25 MG tablet TAKE 1 TABLET(25 MG) BY MOUTH TWICE DAILY AS NEEDED FOR DIZZINESS 20 tablet 0   metFORMIN (GLUCOPHAGE) 500 MG tablet Take 1 tablet (500 mg total) by mouth daily with breakfast. 90 tablet 1   montelukast (SINGULAIR) 10 MG tablet Take 1 tablet (10 mg total) by mouth at bedtime. 30 tablet 5   omeprazole (PRILOSEC) 20 MG capsule TAKE 1 CAPSULE(20 MG) BY MOUTH TWICE DAILY BEFORE A MEAL 180 capsule 0   oxycodone (OXY-IR) 5 MG capsule Take 5 mg by mouth every 8  (eight) hours as needed.     promethazine (PHENERGAN) 25 MG tablet TAKE 1 TABLET(25 MG) BY MOUTH EVERY 8 HOURS AS NEEDED FOR NAUSEA OR VOMITING 20 tablet 0   rosuvastatin (CRESTOR) 20 MG tablet TAKE 1 TABLET(20 MG) BY MOUTH DAILY 30 tablet 2   spironolactone (ALDACTONE) 25 MG tablet TAKE 1/2 TABLET(12.5 MG) BY MOUTH DAILY 34 tablet 1   No current facility-administered medications on file prior to visit.    ROS: See HPI  Observations/Objective: Awake, alert, oriented x3 Not in acute distress Normal mood      Latest Ref Rng & Units 06/14/2022   12:02 PM 02/12/2022    9:37 AM 06/13/2021    4:11 PM  CMP  Glucose 70 - 99 mg/dL 91  258  95   BUN 6 - 24 mg/dL 8  8  9    Creatinine 0.57 - 1.00 mg/dL  5.27  7.82   Sodium 134 - 144 mmol/L 141  144  145   Potassium 3.5 - 5.2 mmol/L 4.5  4.2  4.1   Chloride 96 - 106 mmol/L 99  102  102   CO2 20 - 29 mmol/L 26  26  24    Calcium 8.7 - 10.2 mg/dL 4.23  9.4    Total Protein 6.0 - 8.5 g/dL  7.3    Total Bilirubin 0.0 - 1.2 mg/dL  0.3    Alkaline Phos 44 - 121 IU/L  84    AST 0 - 40 IU/L  17    ALT 0 - 32 IU/L  17      Lipid Panel     Component Value Date/Time   CHOL 170 02/12/2022 0937   TRIG 107 02/12/2022 0937   HDL 58 02/12/2022 0937   CHOLHDL 2.9 02/12/2022 0937   CHOLHDL 3.8 03/19/2009 0414   VLDL 13 03/19/2009 0414   LDLCALC 93 02/12/2022 0937   LABVLDL 19 02/12/2022 0937    Lab Results  Component Value Date   HGBA1C 6.2 06/14/2022    Assessment and Plan: 1. Non-seasonal allergic rhinitis, unspecified trigger Uncontrolled Counseled on nasal irrigation Short course of prednisone added to regimen We will refill Zyrtec - cetirizine (ZYRTEC) 10 MG tablet; Take 1 tablet (10 mg total) by mouth daily.  Dispense: 30 tablet; Refill: 1 - predniSONE (DELTASONE) 20 MG tablet; Take 1 tablet (20 mg total) by mouth daily with breakfast.  Dispense: 5 tablet; Refill: 0   Follow Up Instructions: Keep previously scheduled  appointment   I discussed the assessment and treatment plan with the patient. The patient was provided an opportunity to ask questions and all were answered. The patient agreed with the plan and demonstrated an understanding of the instructions.   The patient was advised to call back or seek an in-person evaluation  if the symptoms worsen or if the condition fails to improve as anticipated.     I provided 11 minutes total of non-face-to-face time during this encounter.   Charlott Rakes, MD, FAAFP. [facility] [place], [place]   07/23/2022, 8:11 AM

## 2022-08-01 DIAGNOSIS — M5416 Radiculopathy, lumbar region: Secondary | ICD-10-CM | POA: Diagnosis not present

## 2022-08-01 DIAGNOSIS — M5136 Other intervertebral disc degeneration, lumbar region: Secondary | ICD-10-CM | POA: Diagnosis not present

## 2022-08-14 ENCOUNTER — Other Ambulatory Visit: Payer: Self-pay | Admitting: Internal Medicine

## 2022-09-07 DIAGNOSIS — Z7951 Long term (current) use of inhaled steroids: Secondary | ICD-10-CM | POA: Diagnosis not present

## 2022-09-07 DIAGNOSIS — M5416 Radiculopathy, lumbar region: Secondary | ICD-10-CM | POA: Diagnosis not present

## 2022-09-07 DIAGNOSIS — I10 Essential (primary) hypertension: Secondary | ICD-10-CM | POA: Diagnosis not present

## 2022-09-07 DIAGNOSIS — G709 Myoneural disorder, unspecified: Secondary | ICD-10-CM | POA: Diagnosis not present

## 2022-09-07 DIAGNOSIS — Z79891 Long term (current) use of opiate analgesic: Secondary | ICD-10-CM | POA: Diagnosis not present

## 2022-09-07 DIAGNOSIS — Z7984 Long term (current) use of oral hypoglycemic drugs: Secondary | ICD-10-CM | POA: Diagnosis not present

## 2022-09-07 DIAGNOSIS — M1990 Unspecified osteoarthritis, unspecified site: Secondary | ICD-10-CM | POA: Diagnosis not present

## 2022-09-07 DIAGNOSIS — E119 Type 2 diabetes mellitus without complications: Secondary | ICD-10-CM | POA: Diagnosis not present

## 2022-09-07 DIAGNOSIS — M5136 Other intervertebral disc degeneration, lumbar region: Secondary | ICD-10-CM | POA: Diagnosis not present

## 2022-09-07 DIAGNOSIS — Z79899 Other long term (current) drug therapy: Secondary | ICD-10-CM | POA: Diagnosis not present

## 2022-09-12 DIAGNOSIS — M5416 Radiculopathy, lumbar region: Secondary | ICD-10-CM | POA: Diagnosis not present

## 2022-09-12 DIAGNOSIS — M5136 Other intervertebral disc degeneration, lumbar region: Secondary | ICD-10-CM | POA: Diagnosis not present

## 2022-09-12 DIAGNOSIS — M4726 Other spondylosis with radiculopathy, lumbar region: Secondary | ICD-10-CM | POA: Diagnosis not present

## 2022-09-28 ENCOUNTER — Other Ambulatory Visit: Payer: Self-pay | Admitting: Internal Medicine

## 2022-09-28 DIAGNOSIS — K5909 Other constipation: Secondary | ICD-10-CM

## 2022-10-01 NOTE — Telephone Encounter (Signed)
Requested Prescriptions  Pending Prescriptions Disp Refills   LINZESS 290 MCG CAPS capsule [Pharmacy Med Name: [name] CAPSULES] 90 capsule 0    Sig: TAKE 1 CAPSULE(290 MCG) BY MOUTH DAILY BEFORE BREAKFAST     Gastroenterology: Irritable Bowel Syndrome Passed - 09/28/2022  3:31 PM      Passed - Valid encounter within last 12 months    Recent Outpatient Visits           2 months ago Non-seasonal allergic rhinitis, unspecified trigger   [facility] [place], Odette Horns, MD   3 months ago New onset type 2 diabetes mellitus [facility])   [facility] Jonah Blue B, MD   5 months ago Type 2 diabetes mellitus with hyperglycemia, without long-term current use of insulin [facility])   [facility] [place], [place], [place]   7 months ago Essential hypertension   [facility] Marcine Matar, MD   11 months ago Essential hypertension   [facility] Marcine Matar, MD       Future Appointments             In 2 weeks Marcine Matar, MD [facility]

## 2022-10-02 ENCOUNTER — Other Ambulatory Visit: Payer: Self-pay | Admitting: Internal Medicine

## 2022-10-02 DIAGNOSIS — K219 Gastro-esophageal reflux disease without esophagitis: Secondary | ICD-10-CM

## 2022-10-10 ENCOUNTER — Other Ambulatory Visit: Payer: Self-pay | Admitting: Internal Medicine

## 2022-10-10 DIAGNOSIS — I10 Essential (primary) hypertension: Secondary | ICD-10-CM

## 2022-10-11 ENCOUNTER — Telehealth: Payer: Medicare Other | Admitting: Physician Assistant

## 2022-10-11 ENCOUNTER — Ambulatory Visit: Payer: Self-pay

## 2022-10-11 ENCOUNTER — Other Ambulatory Visit: Payer: Self-pay | Admitting: Internal Medicine

## 2022-10-11 DIAGNOSIS — E782 Mixed hyperlipidemia: Secondary | ICD-10-CM

## 2022-10-11 NOTE — Telephone Encounter (Signed)
Patient is calling to report the video visit did not work- she declines to reschedule. Patient advised of other options- UC/mobile unit and she declines due to transportation. Advised of late hours- UC and she still declines- states she will have to wait until Monday. I informed patient I would send call to [employer] to see if there is something else that can be offered to her.

## 2022-10-11 NOTE — Progress Notes (Signed)
The patient no-showed for appointment despite this provider sending direct link, reaching out via phone with no response and waiting for at least 10 minutes from appointment time for patient to join. They will be marked as a NS for this appointment/time.  ? ?Dmario Russom Cody Surena Welge, PA-C ? ? ? ?

## 2022-10-11 NOTE — Telephone Encounter (Signed)
  Chief Complaint: COVID+ Symptoms: Chills, BA, Vomiting, diarrhea, dizziness, HA, cough Frequency: Since _0 . O2 SATURATION MONITOR:  "Do you use an oxygen saturation monitor (pulse oximeter) at home?" If Yes, ask "What is your reading (oxygen level) today?" "What is your usual oxygen saturation reading?" (e.g., 95%)  Protocols used: Coronavirus (COVID-19) Diagnosed or Suspected-A-AH

## 2022-10-12 NOTE — Telephone Encounter (Signed)
Pt has upcoming appointment on Monday to discuss concerns.

## 2022-10-15 ENCOUNTER — Ambulatory Visit: Payer: Medicare Other | Attending: Internal Medicine | Admitting: Internal Medicine

## 2022-10-15 ENCOUNTER — Encounter: Payer: Self-pay | Admitting: Internal Medicine

## 2022-10-15 VITALS — BP 105/73 | HR 84 | Temp 98.1°F | Wt 138.0 lb

## 2022-10-15 DIAGNOSIS — I152 Hypertension secondary to endocrine disorders: Secondary | ICD-10-CM | POA: Diagnosis not present

## 2022-10-15 DIAGNOSIS — E1159 Type 2 diabetes mellitus with other circulatory complications: Secondary | ICD-10-CM | POA: Diagnosis not present

## 2022-10-15 DIAGNOSIS — Z23 Encounter for immunization: Secondary | ICD-10-CM

## 2022-10-15 DIAGNOSIS — E1169 Type 2 diabetes mellitus with other specified complication: Secondary | ICD-10-CM | POA: Diagnosis not present

## 2022-10-15 DIAGNOSIS — U071 COVID-19: Secondary | ICD-10-CM

## 2022-10-15 DIAGNOSIS — J3089 Other allergic rhinitis: Secondary | ICD-10-CM | POA: Diagnosis not present

## 2022-10-15 DIAGNOSIS — E119 Type 2 diabetes mellitus without complications: Secondary | ICD-10-CM

## 2022-10-15 DIAGNOSIS — E785 Hyperlipidemia, unspecified: Secondary | ICD-10-CM | POA: Diagnosis not present

## 2022-10-15 LAB — POCT GLYCOSYLATED HEMOGLOBIN (HGB A1C): HbA1c, POC (controlled diabetic range): 5.8 % (ref 0.0–7.0)

## 2022-10-15 LAB — GLUCOSE, POCT (MANUAL RESULT ENTRY): POC Glucose: 102 mg/dl — AB (ref 70–99)

## 2022-10-15 MED ORDER — CETIRIZINE HCL 10 MG PO TABS: 10.0000 mg | ORAL_TABLET | Freq: Every day | ORAL | 1 refills | Status: DC

## 2022-10-15 NOTE — Patient Instructions (Signed)
Stop Spironolactone Fatigue and cough may last several weeks after having COVID infection. Plan to get COVID booster about 12 weeks after COVID infection.

## 2022-10-15 NOTE — Addendum Note (Signed)
Addended by: Jonah Blue B on: 10/15/2022 01:09 PM   Modules accepted: Level of Service

## 2022-10-15 NOTE — Progress Notes (Signed)
Patient ID: PRISILA DLOUHY, female    DOB: Sep 07, 1966  MRN: [id number]  CC: Diabetes (DM f/u. Rachell Cipro energy, not feeling well after meals. Libby Maw at night X2 weeks, rattle when coughing, sore throat, requesting covid test./Unsure about flu vax.)   Subjective: Bonnee Zertuche is a 56 y.o. female who presents for chronic ds management Her concerns today include:  t with hx of HTN, chronic constipation, anal fistulas, vertigo, HL, chronic back pain (followed by pain specialist), hot flashes (on gabapentin) and GERD   DM: Results for orders placed or performed in visit on 10/15/22  POCT glucose (manual entry)  Result Value Ref Range   POC Glucose 102 (A) 70 - 99 mg/dl  POCT glycosylated hemoglobin (Hb A1C)  Result Value Ref Range   Hemoglobin A1C     HbA1c POC (<> result, manual entry)     HbA1c, POC (prediabetic range)     HbA1c, POC (controlled diabetic range) 5.8 0.0 - 7.0 %  Down additional 3 lbs since last visit Eating healthy Exercise: was walking daily at mall until recent dx of COVID.  Dx with COVID 1 wk ago by home COVID test.  "I was just sick." Still has chills, annoying dry cough and rattling in her chest.  Feels fatigue.  No fever.  Taking Mucinex which has helped in breaking up the mucous in her chest.  Requests refill on Zyrtec which she takes for allergy symptoms. Due for flu and COVID booster She plans to reschedule with Dr. Dione Booze for eye exam but was never called  HTN:  does not check BP. Takes both BP meds in the afternoon -spironolactone 12.5 mg daily and Norvasc 10 mg daily..  Some dizziness since having COVID infection.  Drinking adequate fluids during the day  HL:  taking and tolerating Crestor  Patient Active Problem List   Diagnosis Date Noted   New onset type 2 diabetes mellitus (HCC) 02/14/2022   Altered consciousness 12/10/2018   Carpal tunnel syndrome of right wrist 06/12/2018   Postnasal drip 06/12/2018   Vertigo 10/18/2016   Disturbance of skin sensation  04/19/2014   Anal fistula 04/14/2013   UNSPECIFIED SITE OF SPRAIN AND STRAIN 07/07/2009   PEDAL EDEMA 06/29/2009   MUSCLE PAIN 06/23/2009   ANEMIA-IRON DEFICIENCY 03/11/2009   Allergic rhinitis 03/11/2009   SYNCOPE, VASOVAGAL 03/11/2009   HEADACHE 03/11/2009     Current Outpatient Medications on File Prior to Visit  Medication Sig Dispense Refill   albuterol (VENTOLIN HFA) 108 (90 Base) MCG/ACT inhaler Inhale 2 puffs into the lungs every 6 (six) hours as needed for wheezing or shortness of breath. 6.7 g 1   amLODipine (NORVASC) 10 MG tablet TAKE 1 TABLET(10 MG) BY MOUTH DAILY 90 tablet 1   benzonatate (TESSALON) 100 MG capsule TAKE 1 CAPSULE(100 MG) BY MOUTH TWICE DAILY AS NEEDED FOR COUGH 30 capsule 0   Blood Pressure Monitor DEVI Use as directed to check home blood pressure 2-3 times a week 1 Device 0   diclofenac sodium (VOLTAREN) 1 % GEL Apply 2 g topically 4 (four) times daily. 100 g 0   diphenhydrAMINE (BENADRYL) 25 MG tablet Take 1 tablet (25 mg total) by mouth every 6 (six) hours as needed. 20 tablet 0   DULoxetine (CYMBALTA) 30 MG capsule Take 1 capsule by mouth at bedtime.     EPINEPHrine (EPIPEN 2-PAK) 0.3 mg/0.3 mL IJ SOAJ injection Inject 0.3 mLs (0.3 mg total) into the muscle as needed for anaphylaxis. 1 each 0  fluticasone (FLONASE) 50 MCG/ACT nasal spray Place 1 spray into both nostrils daily. 16 g 2   fluticasone (FLONASE) 50 MCG/ACT nasal spray Place 1 spray into both nostrils daily. 16 g 3   gabapentin (NEURONTIN) 300 MG capsule Take 1 capsule (300 mg total) by mouth at bedtime. 30 capsule 3   LINZESS 290 MCG CAPS capsule TAKE 1 CAPSULE(290 MCG) BY MOUTH DAILY BEFORE BREAKFAST 90 capsule 0   meclizine (ANTIVERT) 25 MG tablet TAKE 1 TABLET(25 MG) BY MOUTH TWICE DAILY AS NEEDED FOR DIZZINESS 20 tablet 0   metFORMIN (GLUCOPHAGE) 500 MG tablet TAKE 1 TABLET(500 MG) BY MOUTH DAILY WITH BREAKFAST 90 tablet 1   montelukast (SINGULAIR) 10 MG tablet Take 1 tablet (10 mg total)  by mouth at bedtime. 30 tablet 5   omeprazole (PRILOSEC) 20 MG capsule TAKE 1 CAPSULE(20 MG) BY MOUTH TWICE DAILY BEFORE A MEAL 180 capsule 0   oxycodone (OXY-IR) 5 MG capsule Take 5 mg by mouth every 8 (eight) hours as needed.     promethazine (PHENERGAN) 25 MG tablet TAKE 1 TABLET(25 MG) BY MOUTH EVERY 8 HOURS AS NEEDED FOR NAUSEA OR VOMITING 20 tablet 0   rosuvastatin (CRESTOR) 20 MG tablet TAKE 1 TABLET(20 MG) BY MOUTH DAILY 30 tablet 0   No current facility-administered medications on file prior to visit.    Allergies  Allergen Reactions   Ampicillin Anaphylaxis   Atorvastatin Calcium Other (See Comments)    Pt reported lip swelling   Penicillins Anaphylaxis   Sulfonamide Derivatives Anaphylaxis   Hydrocodone-Acetaminophen Hives    Have to take a Benadryl to take med   Tramadol Itching   Tylenol [Acetaminophen] Itching    Social History   Socioeconomic History   Marital status: Single    Spouse name: Not on file   Number of children: 2   Years of education: 10   Highest education level: Not on file  Occupational History   Occupation: N/A  Tobacco Use   Smoking status: Never   Smokeless tobacco: Never  Vaping Use   Vaping Use: Never used  Substance and Sexual Activity   Alcohol use: Never    Comment: occa   Drug use: No   Sexual activity: Yes    Birth control/protection: Pill  Other Topics Concern   Not on file  Social History Narrative   Lives at home w/ her mother   Right-handed   Caffeine: 1 cup of tea per day   Social Determinants of Health   Financial Resource Strain: Not on file  Food Insecurity: Not on file  Transportation Needs: Not on file  Physical Activity: Not on file  Stress: Not on file  Social Connections: Not on file  Intimate Partner Violence: Not on file    Family History  Problem Relation Age of Onset   Diabetes Mother    Hypertension Mother    Heart attack Father    Seizures Sister    Seizures Sister    Diabetes Brother     Hypertension Brother    Diabetes Maternal Aunt    Diabetes Maternal Uncle    Hypertension Maternal Uncle    Hypertension Son    Colon cancer Neg Hx    Stomach cancer Neg Hx     Past Surgical History:  Procedure Laterality Date   ANAL FISTULECTOMY  08/22/2012   Procedure: FISTULECTOMY ANAL;  Surgeon: Lodema Pilot, DO;  Location: [facility];  Service: General;  Laterality: N/A;  rectal examination under anesthesia with seton  placement    ANAL FISTULECTOMY  09/17/2012   Procedure: FISTULECTOMY ANAL;  Surgeon: Lodema Pilot, DO;  Location: [facility];  Service: General;  Laterality: N/A;  possible fistulotomy   D & C HYSTEROSCOPY/ NOVASURE ENDOMETRIAL ABLATION/ I & D THROMBOSED HEMORROID  01-10-2007  DR Isaias Cowman ROSS   EXAMINATION UNDER ANESTHESIA  09/17/2012   Procedure: EXAM UNDER ANESTHESIA;  Surgeon: Lodema Pilot, DO;  Location: [facility];  Service: General;  Laterality: N/A;  Rectal exam under anesthesia   EXAMINATION UNDER ANESTHESIA N/A 05/14/2013   Procedure: EXAM UNDER ANESTHESIA;  Surgeon: Romie Levee, MD;  Location: [facility];  Service: General;  Laterality: N/A;   FISTULA PLUG  09/17/2012   Procedure: FISTULA PLUG;  Surgeon: Lodema Pilot, DO;  Location: [facility];  Service: General;  Laterality: N/A;  Possible fistula plug   HEMORRHOID SURGERY  04-28-2009   [facility] INTERNAL HEMORRHOIDS   IRRIGATION AND DEBRIDEMENT ABSCESS  07/08/2012   Procedure: MINOR INCISION AND DRAINAGE OF ABSCESS;  Surgeon: Geryl Rankins, MD;  Location: [facility];  Service: Gynecology;  Laterality: N/A;  Vulvar Abscess   PLACEMENT OF SETON N/A 05/14/2013   Procedure:  PLACEMENT OF SETON;  Surgeon: Romie Levee, MD;  Location: [facility];  Service: General;  Laterality: N/A;   RECTAL EXAM UNDER ANESTHESIA N/A 08/20/2013   Procedure: mucosal advancement flap ;  Surgeon: Romie Levee, MD;  Location: [facility];  Service: General;   Laterality: N/A;  parks anal retractor long rectal instrucments prone jack knife anal fistula    RECTAL ULTRASOUND N/A 07/20/2013   Procedure: RECTAL ULTRASOUND;  Surgeon: Romie Levee, MD;  Location: [facility];  Service: Endoscopy;  Laterality: N/A;    ROS: Review of Systems Negative except as stated above  PHYSICAL EXAM: BP 105/73 (BP Location: Left Arm, Patient Position: Sitting, Cuff Size: Normal)   Pulse 84   Temp 98.1 F (36.7 C) (Oral)   Wt 138 lb (62.6 kg)   SpO2 99%   BMI 23.69 kg/m   Wt Readings from Last 3 Encounters:  10/15/22 138 lb (62.6 kg)  06/14/22 141 lb 6.4 oz (64.1 kg)  05/02/22 142 lb 6.4 oz (64.6 kg)    Physical Exam  General appearance - alert, middle age to older AAF, well appearing, and in no distress Mental status - normal mood, behavior, speech, dress, motor activity, and thought processes Nose -mild enlargement of nasal turbinates. Mouth -throat is without erythema or exudates. Neck - supple, no significant adenopathy Chest - clear to auscultation, no wheezes, rales or rhonchi, symmetric air entry Heart - normal rate, regular rhythm, normal S1, S2, no murmurs, rubs, clicks or gallops Extremities - peripheral pulses normal, no pedal edema, no clubbing or cyanosis      Latest Ref Rng & Units 06/14/2022   12:02 PM 02/12/2022    9:37 AM 06/13/2021    4:11 PM  CMP  Glucose 70 - 99 mg/dL 91  094  95   BUN 6 - 24 mg/dL 8  8  9    Creatinine 0.57 - 1.00 mg/dL  7.09  6.28   Sodium 134 - 144 mmol/L 141  144  145   Potassium 3.5 - 5.2 mmol/L 4.5  4.2  4.1   Chloride 96 - 106 mmol/L 99  102  102   CO2 20 - 29 mmol/L 26  26  24    Calcium 8.7 - 10.2 mg/dL 3.66  9.4  10.4   Total Protein 6.0 - 8.5 g/dL  7.3    Total Bilirubin 0.0 - 1.2 mg/dL  0.3    Alkaline Phos 44 - 121 IU/L  84    AST 0 - 40 IU/L  17    ALT 0 - 32 IU/L  17     Lipid Panel     Component Value Date/Time   CHOL 170 02/12/2022 0937   TRIG 107 02/12/2022 0937   HDL 58  02/12/2022 0937   CHOLHDL 2.9 02/12/2022 0937   CHOLHDL 3.8 03/19/2009 0414   VLDL 13 03/19/2009 0414   LDLCALC 93 02/12/2022 0937    CBC    Component Value Date/Time   WBC 5.5 06/14/2022 1202   WBC 5.5 04/01/2019 1054   RBC 4.81 06/14/2022 1202   RBC 4.34 04/01/2019 1054   HGB 13.9 06/14/2022 1202   HGB 13.8 09/05/2009 1347   HCT 42.9 06/14/2022 1202   HCT 41.3 09/05/2009 1347   PLT 344 06/14/2022 1202   MCV 89 06/14/2022 1202   MCV 92.0 09/05/2009 1347   MCH 28.9 06/14/2022 1202   MCH 29.5 04/01/2019 1054   MCHC 32.4 06/14/2022 1202   MCHC 32.3 04/01/2019 1054   RDW 13.0 06/14/2022 1202   RDW 13.0 09/05/2009 1347   LYMPHSABS 2.2 04/01/2019 1054   LYMPHSABS 2.9 03/16/2019 1427   LYMPHSABS 2.1 09/05/2009 1347   MONOABS 0.4 04/01/2019 1054   MONOABS 0.3 09/05/2009 1347   EOSABS 0.1 04/01/2019 1054   EOSABS 0.1 03/16/2019 1427   BASOSABS 0.1 04/01/2019 1054   BASOSABS 0.1 03/16/2019 1427   BASOSABS 0.1 09/05/2009 1347    ASSESSMENT AND PLAN:  1. New onset type 2 diabetes mellitus (HCC) A1c is at goal. Continue metformin. Encouraged her to continue healthy eating habits and regular exercise. - POCT glucose (manual entry) - POCT glycosylated hemoglobin (Hb A1C) - Ambulatory referral to Ophthalmology - Microalbumin / creatinine urine ratio - CBC  2. Hypertension associated with type 2 diabetes mellitus (HCC) At goal and in the low normal range. I recommend we give a trial of stopping spironolactone since blood pressure in the low normal range and she has been having some dizziness.  Continue amlodipine 10 mg daily.  3. Hyperlipidemia associated with type 2 diabetes mellitus (HCC) Continue Crestor.  Last LDL was 93 8 months ago.  4. Non-seasonal allergic rhinitis, unspecified trigger - cetirizine (ZYRTEC) 10 MG tablet; Take 1 tablet (10 mg total) by mouth daily.  Dispense: 30 tablet; Refill: 1  5. Need for immunization against influenza - Flu Vaccine QUAD  6552mo+IM (Fluarix, Fluzone & Alfiuria Quad PF)  6. COVID-19 virus infection Since she is recovering from COVID, I recommend holding off on getting an updated COVID booster for about 12 weeks.    Patient was given the opportunity to ask questions.  Patient verbalized understanding of the plan and was able to repeat key elements of the plan.   This documentation was completed using Paediatric nurseDragon voice recognition technology.  Any transcriptional errors are unintentional.  Orders Placed This Encounter  Procedures   Flu Vaccine QUAD 8052mo+IM (Fluarix, Fluzone & Alfiuria Quad PF)   Microalbumin / creatinine urine ratio   CBC   Ambulatory referral to Ophthalmology   POCT glucose (manual entry)   POCT glycosylated hemoglobin (Hb A1C)     Requested Prescriptions   Signed Prescriptions Disp Refills   cetirizine (ZYRTEC) 10 MG tablet 30 tablet 1    Sig: Take 1 tablet (10 mg  total) by mouth daily.    Return in about 4 months (around 02/14/2023) for Give telephone appt with RN or CMA for [facility] Wellness Visit.  Jonah Blue, MD, FACP

## 2022-10-16 LAB — CBC
Hematocrit: 40.4 % (ref 34.0–46.6)
Hemoglobin: 13.5 g/dL (ref 11.1–15.9)
MCH: 29.6 pg (ref 26.6–33.0)
MCHC: 33.4 g/dL (ref 31.5–35.7)
MCV: 89 fL (ref 79–97)
Platelets: 304 10*3/uL (ref 150–450)
RBC: 4.56 x10E6/uL (ref 3.77–5.28)
RDW: 12.3 % (ref 11.7–15.4)
WBC: 5.1 10*3/uL (ref 3.4–10.8)

## 2022-10-16 LAB — MICROALBUMIN / CREATININE URINE RATIO
Creatinine, Urine: 375.7 mg/dL
Microalb/Creat Ratio: 9 mg/g creat (ref 0–29)
Microalbumin, Urine: 32 ug/mL

## 2022-10-18 DIAGNOSIS — H25813 Combined forms of age-related cataract, bilateral: Secondary | ICD-10-CM | POA: Diagnosis not present

## 2022-10-18 DIAGNOSIS — H1045 Other chronic allergic conjunctivitis: Secondary | ICD-10-CM | POA: Diagnosis not present

## 2022-10-18 DIAGNOSIS — E119 Type 2 diabetes mellitus without complications: Secondary | ICD-10-CM | POA: Diagnosis not present

## 2022-10-19 DIAGNOSIS — G8929 Other chronic pain: Secondary | ICD-10-CM | POA: Diagnosis not present

## 2022-10-19 DIAGNOSIS — E119 Type 2 diabetes mellitus without complications: Secondary | ICD-10-CM | POA: Diagnosis not present

## 2022-10-19 DIAGNOSIS — Z7961 Long term (current) use of immunomodulator: Secondary | ICD-10-CM | POA: Diagnosis not present

## 2022-10-19 DIAGNOSIS — M1990 Unspecified osteoarthritis, unspecified site: Secondary | ICD-10-CM | POA: Diagnosis not present

## 2022-10-19 DIAGNOSIS — Z7984 Long term (current) use of oral hypoglycemic drugs: Secondary | ICD-10-CM | POA: Diagnosis not present

## 2022-10-19 DIAGNOSIS — I10 Essential (primary) hypertension: Secondary | ICD-10-CM | POA: Diagnosis not present

## 2022-10-19 DIAGNOSIS — Z79899 Other long term (current) drug therapy: Secondary | ICD-10-CM | POA: Diagnosis not present

## 2022-10-19 DIAGNOSIS — M5416 Radiculopathy, lumbar region: Secondary | ICD-10-CM | POA: Diagnosis not present

## 2022-10-19 HISTORY — PX: BACK SURGERY: SHX140

## 2022-11-09 ENCOUNTER — Other Ambulatory Visit: Payer: Self-pay | Admitting: Internal Medicine

## 2022-11-09 ENCOUNTER — Ambulatory Visit: Payer: Medicare Other | Attending: Internal Medicine

## 2022-11-09 DIAGNOSIS — E2839 Other primary ovarian failure: Secondary | ICD-10-CM

## 2022-11-09 DIAGNOSIS — Z0000 Encounter for general adult medical examination without abnormal findings: Secondary | ICD-10-CM

## 2022-11-09 DIAGNOSIS — I10 Essential (primary) hypertension: Secondary | ICD-10-CM

## 2022-11-09 NOTE — Addendum Note (Signed)
Addended by: Karle Plumber B on: 11/09/2022 09:30 PM   Modules accepted: Orders

## 2022-11-09 NOTE — Patient Instructions (Addendum)
[name] , Thank you for taking time to come for your Medicare Wellness Visit. I appreciate your ongoing commitment to your health goals. Please review the following plan we discussed and let me know if I can assist you in the future.   These are the goals we discussed:  Goals   None     This is a list of the screening recommended for you and due dates:  Health Maintenance  Topic Date Due   COVID-19 Vaccine (6 - 2023-24 season) 07/06/2022   Hemoglobin A1C  04/16/2023   Yearly kidney function blood test for diabetes  06/15/2023   Mammogram  07/13/2023   Yearly kidney health urinalysis for diabetes  10/16/2023   Complete foot exam   10/16/2023   Eye exam for diabetics  10/19/2023   Medicare Annual Wellness Visit  11/10/2023   Pap Smear  03/21/2024   Colon Cancer Screening  03/17/2025   DTaP/Tdap/Td vaccine (2 - Td or Tdap) 10/13/2028   Flu Shot  Completed   Hepatitis C Screening: USPSTF Recommendation to screen - Ages 18-79 yo.  Completed   HIV Screening  Completed   Zoster (Shingles) Vaccine  Completed   HPV Vaccine  Aged Out  Health Maintenance, Female Adopting a healthy lifestyle and getting preventive care are important in promoting health and wellness. Ask your health care provider about: The right schedule for you to have regular tests and exams. Things you can do on your own to prevent diseases and keep yourself healthy. What should I know about diet, weight, and exercise? Eat a healthy diet  Eat a diet that includes plenty of vegetables, fruits, low-fat dairy products, and lean protein. Do not eat a lot of foods that are high in solid fats, added sugars, or sodium. Maintain a healthy weight Body mass index (BMI) is used to identify weight problems. It estimates body fat based on height and weight. Your health care provider can help determine your BMI and help you achieve or maintain a healthy weight. Get regular exercise Get regular exercise. This is one of the most  important things you can do for your health. Most adults should: Exercise for at least 150 minutes each week. The exercise should increase your heart rate and make you sweat (moderate-intensity exercise). Do strengthening exercises at least twice a week. This is in addition to the moderate-intensity exercise. Spend less time sitting. Even light physical activity can be beneficial. Watch cholesterol and blood lipids Have your blood tested for lipids and cholesterol at 59 years of age, then have this test every 5 years. Have your cholesterol levels checked more often if: Your lipid or cholesterol levels are high. You are older than 59 years of age. You are at high risk for heart disease. What should I know about cancer screening? Depending on your health history and family history, you may need to have cancer screening at various ages. This may include screening for: Breast cancer. Cervical cancer. Colorectal cancer. Skin cancer. Lung cancer. What should I know about heart disease, diabetes, and high blood pressure? Blood pressure and heart disease High blood pressure causes heart disease and increases the risk of stroke. This is more likely to develop in people who have high blood pressure readings or are overweight. Have your blood pressure checked: Every 3-5 years if you are 1-51 years of age. Every year if you are 61 years old or older. Diabetes Have regular diabetes screenings. This checks your fasting blood sugar level. Have the screening  done: Once every three years after age 41 if you are at a normal weight and have a low risk for diabetes. More often and at a younger age if you are overweight or have a high risk for diabetes. What should I know about preventing infection? Hepatitis B If you have a higher risk for hepatitis B, you should be screened for this virus. Talk with your health care provider to find out if you are at risk for hepatitis B infection. Hepatitis C Testing  is recommended for: Everyone born from [birth year] through 1967. Anyone with known risk factors for hepatitis C. Sexually transmitted infections (STIs) Get screened for STIs, including gonorrhea and chlamydia, if: You are sexually active and are younger than 59 years of age. You are older than 59 years of age and your health care provider tells you that you are at risk for this type of infection. Your sexual activity has changed since you were last screened, and you are at increased risk for chlamydia or gonorrhea. Ask your health care provider if you are at risk. Ask your health care provider about whether you are at high risk for HIV. Your health care provider may recommend a prescription medicine to help prevent HIV infection. If you choose to take medicine to prevent HIV, you should first get tested for HIV. You should then be tested every 3 months for as long as you are taking the medicine. Pregnancy If you are about to stop having your period (premenopausal) and you may become pregnant, seek counseling before you get pregnant. Take 400 to 800 micrograms (mcg) of folic acid every day if you become pregnant. Ask for birth control (contraception) if you want to prevent pregnancy. Osteoporosis and menopause Osteoporosis is a disease in which the bones lose minerals and strength with aging. This can result in bone fractures. If you are 87 years old or older, or if you are at risk for osteoporosis and fractures, ask your health care provider if you should: Be screened for bone loss. Take a calcium or vitamin D supplement to lower your risk of fractures. Be given hormone replacement therapy (HRT) to treat symptoms of menopause. Follow these instructions at home: Alcohol use Do not drink alcohol if: Your health care provider tells you not to drink. You are pregnant, may be pregnant, or are planning to become pregnant. If you drink alcohol: Limit how much you have to: 0-1 drink a day. Know how much  alcohol is in your drink. In the [place] one drink equals one 12 oz bottle of beer (355 mL), one 5 oz glass of wine (148 mL), or one 1 oz glass of hard liquor (44 mL). Lifestyle Do not use any products that contain nicotine or tobacco. These products include cigarettes, chewing tobacco, and vaping devices, such as e-cigarettes. If you need help quitting, ask your health care provider. Do not use street drugs. Do not share needles. Ask your health care provider for help if you need support or information about quitting drugs. General instructions Schedule regular health, dental, and eye exams. Stay current with your vaccines. Tell your health care provider if: You often feel depressed. You have ever been abused or do not feel safe at home. Summary Adopting a healthy lifestyle and getting preventive care are important in promoting health and wellness. Follow your health care provider's instructions about healthy diet, exercising, and getting tested or screened for diseases. Follow your health care provider's instructions on monitoring your cholesterol and blood pressure. This  information is not intended to replace advice given to you by your health care provider. Make sure you discuss any questions you have with your health care provider. Document Revised: 03/13/2021 Document Reviewed: 03/13/2021 Elsevier Patient Education  [place].

## 2022-11-09 NOTE — Progress Notes (Signed)
Subjective:   [name] is a 57 y.o. female who presents for Medicare Annual (Subsequent) preventive examination.  Review of Systems     I connected with Fara Boros on 11/09/2022 at 10:15 am by telephone and verified that I am speaking with the correct person using two identifiers. I discussed the limitations, risks, security and privacy concerns of performing an evaluation and management service by telephone and the availability of in person appointments. I also discussed with the patient that there may be a patient responsible charge related to this service. The patient expressed understanding and agreed to proceed.   Patient location: Home My Location: [place] on the telephone call: Myself and Patinet     Cardiac Risk Factors include: none     Objective:    Today's Vitals   11/09/22 1014  PainSc: 5    There is no height or weight on file to calculate BMI.     11/09/2022   10:19 AM 06/27/2020    2:40 PM 04/01/2019   10:42 AM 12/10/2018   11:42 AM 08/06/2017    8:52 PM 03/20/2017    2:00 PM 12/12/2016    3:11 PM  Advanced Directives  Does Patient Have a Medical Advance Directive? No No No No No No No  Would patient like information on creating a medical advance directive? Yes (ED - Information included in AVS) No - Patient declined No - Patient declined No - Patient declined Yes (MAU/Ambulatory/Procedural Areas - Information given) No - Patient declined     Current Medications (verified) Outpatient Encounter Medications as of 11/09/2022  Medication Sig   albuterol (VENTOLIN HFA) 108 (90 Base) MCG/ACT inhaler Inhale 2 puffs into the lungs every 6 (six) hours as needed for wheezing or shortness of breath.   amLODipine (NORVASC) 10 MG tablet TAKE 1 TABLET(10 MG) BY MOUTH DAILY   Blood Pressure Monitor DEVI Use as directed to check home blood pressure 2-3 times a week   cetirizine (ZYRTEC) 10 MG tablet Take 1 tablet (10 mg total) by mouth daily.    diclofenac sodium (VOLTAREN) 1 % GEL Apply 2 g topically 4 (four) times daily.   diphenhydrAMINE (BENADRYL) 25 MG tablet Take 1 tablet (25 mg total) by mouth every 6 (six) hours as needed.   DULoxetine (CYMBALTA) 30 MG capsule Take 1 capsule by mouth at bedtime.   EPINEPHrine (EPIPEN 2-PAK) 0.3 mg/0.3 mL IJ SOAJ injection Inject 0.3 mLs (0.3 mg total) into the muscle as needed for anaphylaxis.   fluticasone (FLONASE) 50 MCG/ACT nasal spray Place 1 spray into both nostrils daily.   gabapentin (NEURONTIN) 300 MG capsule Take 1 capsule (300 mg total) by mouth at bedtime.   LINZESS 290 MCG CAPS capsule TAKE 1 CAPSULE(290 MCG) BY MOUTH DAILY BEFORE BREAKFAST   metFORMIN (GLUCOPHAGE) 500 MG tablet TAKE 1 TABLET(500 MG) BY MOUTH DAILY WITH BREAKFAST   montelukast (SINGULAIR) 10 MG tablet Take 1 tablet (10 mg total) by mouth at bedtime.   omeprazole (PRILOSEC) 20 MG capsule TAKE 1 CAPSULE(20 MG) BY MOUTH TWICE DAILY BEFORE A MEAL   oxycodone (OXY-IR) 5 MG capsule Take 5 mg by mouth every 8 (eight) hours as needed.   rosuvastatin (CRESTOR) 20 MG tablet TAKE 1 TABLET(20 MG) BY MOUTH DAILY   benzonatate (TESSALON) 100 MG capsule TAKE 1 CAPSULE(100 MG) BY MOUTH TWICE DAILY AS NEEDED FOR COUGH (Patient not taking: Reported on 11/09/2022)   fluticasone (FLONASE) 50 MCG/ACT nasal spray Place 1 spray into  both nostrils daily. (Patient not taking: Reported on 11/09/2022)   meclizine (ANTIVERT) 25 MG tablet TAKE 1 TABLET(25 MG) BY MOUTH TWICE DAILY AS NEEDED FOR DIZZINESS (Patient not taking: Reported on 11/09/2022)   promethazine (PHENERGAN) 25 MG tablet TAKE 1 TABLET(25 MG) BY MOUTH EVERY 8 HOURS AS NEEDED FOR NAUSEA OR VOMITING (Patient not taking: Reported on 11/09/2022)   No facility-administered encounter medications on file as of 11/09/2022.    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 Claritin   Vertigo    Past Surgical History:  Procedure Laterality Date   ANAL FISTULECTOMY  08/22/2012   Procedure: FISTULECTOMY ANAL;  Surgeon: Madilyn Hook, DO;  Location: [place];  Service: General;  Laterality: N/A;  rectal examination under anesthesia with seton placement    ANAL FISTULECTOMY  09/17/2012   Procedure: FISTULECTOMY ANAL;  Surgeon: Madilyn Hook, DO;  Location: [place];  Service: General;  Laterality: N/A;  possible fistulotomy   BACK SURGERY  10/19/2022   D & C HYSTEROSCOPY/ NOVASURE ENDOMETRIAL ABLATION/ I & D THROMBOSED HEMORROID  01-10-2007  DR [place] [facility]   EXAMINATION UNDER ANESTHESIA  09/17/2012   Procedure: EXAM UNDER ANESTHESIA;  Surgeon: Madilyn Hook, DO;  Location: [place];  Service: General;  Laterality: N/A;  Rectal exam under anesthesia   EXAMINATION UNDER ANESTHESIA N/A 05/14/2013   Procedure: EXAM UNDER ANESTHESIA;  Surgeon: Leighton Ruff, MD;  Location: [facility];  Service: General;  Laterality: N/A;   FISTULA PLUG  09/17/2012   Procedure: FISTULA PLUG;  Surgeon: Madilyn Hook, DO;  Location: [place];  Service: General;  Laterality: N/A;  Possible fistula plug   HEMORRHOID SURGERY  04/28/2009   [facility] HEMORRHOIDS   IRRIGATION AND DEBRIDEMENT ABSCESS  07/08/2012   Procedure: MINOR INCISION AND DRAINAGE OF ABSCESS;  Surgeon: Thurnell Lose, MD;  Location: [place];  Service: Gynecology;  Laterality: N/A;  Vulvar Abscess   PLACEMENT OF SETON N/A 05/14/2013   Procedure:  PLACEMENT OF SETON;  Surgeon: Leighton Ruff, MD;  Location: [facility];  Service: General;  Laterality: N/A;   RECTAL EXAM UNDER ANESTHESIA N/A  08/20/2013   Procedure: mucosal advancement flap ;  Surgeon: Leighton Ruff, MD;  Location[facility];  Service: General;  Laterality: N/A;  parks anal retractor long rectal instrucments prone jack knife anal fistula    RECTAL ULTRASOUND N/A 07/20/2013   Procedure: RECTAL ULTRASOUND;  Surgeon: Leighton Ruff, MD;  Location: [facility];  Service: Endoscopy;  Laterality: N/A;   Family History  Problem Relation Age of Onset   Diabetes Mother    Hypertension Mother    Heart attack Father    Seizures Sister    Seizures Sister    Diabetes Brother    Hypertension Brother    Diabetes Maternal Aunt    Diabetes Maternal Uncle    Hypertension Maternal Uncle    Hypertension Son    Colon cancer Neg Hx    Stomach cancer Neg Hx    Social History   Socioeconomic History   Marital status: Single    Spouse name: Not on file   Number of  children: 2   Years of education: 10   Highest education level: Not on file  Occupational History   Occupation: N/A  Tobacco Use   Smoking status: Never   Smokeless tobacco: Never  Vaping Use   Vaping Use: Never used  Substance and Sexual Activity   Alcohol use: Never    Comment: occa   Drug use: No   Sexual activity: Yes  Other Topics Concern   Not on file  Social History Narrative   Lives at home w/ her mother   Right-handed   Caffeine: 1 cup of tea per day   Social Determinants of Health   Financial Resource Strain: Low Risk  (11/09/2022)   Overall Financial Resource Strain (CARDIA)    Difficulty of Paying Living Expenses: Not hard at all  Food Insecurity: No Food Insecurity (11/09/2022)   Hunger Vital Sign    Worried About Running Out of Food in the Last Year: Never true    Ran Out of Food in the Last Year: Never true  Transportation Needs: No Transportation Needs (11/09/2022)   PRAPARE - Administrator, Civil Service (Medical): No    Lack of Transportation (Non-Medical): No  Physical Activity: Insufficiently Active (11/09/2022)    Exercise Vital Sign    Days of Exercise per Week: 7 days    Minutes of Exercise per Session: 10 min  Stress: No Stress Concern Present (11/09/2022)   [employer] of Occupational Health - Occupational Stress Questionnaire    Feeling of Stress : Not at all  Social Connections: Socially Isolated (11/09/2022)   Social Connection and Isolation Panel [NHANES]    Frequency of Communication with Friends and Family: More than three times a week    Frequency of Social Gatherings with Friends and Family: More than three times a week    Attends Religious Services: Never    Database administrator or Organizations: No    Attends Engineer, structural: Never    Marital Status: Never married    Tobacco Counseling Counseling given: No   Clinical Intake:  Pre-visit preparation completed: No  Pain : 0-10 Pain Score: 5  Pain Location: Back Pain Orientation: Lower Pain Frequency: Occasional     Nutritional Risks: None Diabetes: Yes CBG done?: No  How often do you need to have someone help you when you read instructions, pamphlets, or other written materials from your doctor or pharmacy?: 1 - Never  Diabetic?Yes  Interpreter Needed?: No      Activities of Daily Living    11/09/2022   10:20 AM  In your present state of health, do you have any difficulty performing the following activities:  Hearing? 1  Vision? 0  Difficulty concentrating or making decisions? 0  Walking or climbing stairs? 0  Dressing or bathing? 0  Doing errands, shopping? 0  Preparing Food and eating ? N  Using the Toilet? N  In the past six months, have you accidently leaked urine? N  Do you have problems with loss of bowel control? N  Managing your Medications? N  Managing your Finances? N  Housekeeping or managing your Housekeeping? N    Patient Care Team: Marcine Matar, MD as PCP - General (Internal Medicine) Lunette Stands, MD as Consulting Physician (Orthopedic Surgery)  Indicate any  recent Medical Services you may have received from other than [facility] providers in the past year (date may be approximate).     Assessment:   This is a routine wellness examination  for Niylah.  Hearing/Vision screen No results found.  Dietary issues and exercise activities discussed: Current Exercise Habits: Home exercise routine, Type of exercise: walking, Time (Minutes): 10, Frequency (Times/Week): 7, Weekly Exercise (Minutes/Week): 70, Intensity: Mild, Exercise limited by: None identified   Goals Addressed   None    Depression Screen    11/09/2022   10:20 AM 11/09/2022   10:19 AM 10/15/2022   10:37 AM 06/14/2022   10:51 AM 02/12/2022    8:58 AM 10/12/2021    9:06 AM 07/09/2021   12:14 PM  PHQ 2/9 Scores  PHQ - 2 Score 0 0 2 0 0 0 0  PHQ- 9 Score   3 1   3     Fall Risk    11/09/2022   10:20 AM 10/15/2022   10:27 AM 06/14/2022   10:51 AM 02/12/2022    8:58 AM 10/12/2021    9:06 AM  Fall Risk   Falls in the past year? 0 0 0 0 0  Number falls in past yr: 0 0 0 0 0  Injury with Fall? 0 0 0 0 0  Risk for fall due to : No Fall Risks No Fall Risks No Fall Risks No Fall Risks No Fall Risks  Follow up   Falls evaluation completed      FALL RISK PREVENTION PERTAINING TO THE HOME:  Any stairs in or around the home? No  If so, are there any without handrails? No  Home free of loose throw rugs in walkways, pet beds, electrical cords, etc? Yes  Adequate lighting in your home to reduce risk of falls? Yes   ASSISTIVE DEVICES UTILIZED TO PREVENT FALLS:  Life alert? No  Use of a cane, walker or w/c? No  Grab bars in the bathroom? No  Shower chair or bench in shower? No  Elevated toilet seat or a handicapped toilet? No   TIMED UP AND GO:  Was the test performed? No .  Length of time to ambulate 10 feet: N/A sec.   Gait steady and fast without use of assistive device  Cognitive Function:    11/09/2022   10:21 AM 06/27/2020    2:41 PM  MMSE - Mini Mental State Exam  Orientation to  time 5 5  Orientation to Place 5 5  Registration 3 3  Attention/ Calculation 5 4  Recall 3 2  Language- name 2 objects 2 2  Language- repeat 1 1  Language- follow 3 step command 3 3  Language- read & follow direction 1 1  Write a sentence 1 1  Copy design 1 1  Total score 30 28        11/09/2022   10:21 AM 07/09/2021   12:18 PM  6CIT Screen  What Year? 0 points 0 points  What month? 0 points 0 points  What time? 0 points 0 points  Count back from 20 0 points 0 points  Months in reverse 0 points 0 points  Repeat phrase 0 points 0 points  Total Score 0 points 0 points    Immunizations Immunization History  Administered Date(s) Administered   Influenza Whole 08/10/2009   Influenza,inj,Quad PF,6+ Mos 11/21/2017, 06/18/2019, 06/27/2020, 10/15/2022   Influenza-Unspecified 08/05/2016   Moderna Sars-Covid-2 Vaccination 01/22/2020, 02/18/2020, 08/30/2020, 04/13/2021   Pfizer Covid-19 Vaccine Bivalent Booster 6yrs & up 07/26/2021   Pneumococcal Conjugate-13 09/18/2020   Tdap 10/13/2018   Zoster Recombinat (Shingrix) 11/17/2020, 07/09/2021    TDAP status: Up to date  Flu Vaccine status: Up  to date  Pneumococcal vaccine status: Up to date  Covid-19 vaccine status: Completed vaccines  Qualifies for Shingles Vaccine? Yes   Zostavax completed Yes   Shingrix Completed?: Yes  Screening Tests Health Maintenance  Topic Date Due   COVID-19 Vaccine (6 - 2023-24 season) 07/06/2022   Medicare Annual Wellness (AWV)  07/09/2022   HEMOGLOBIN A1C  04/16/2023   Diabetic kidney evaluation - eGFR measurement  06/15/2023   MAMMOGRAM  07/13/2023   Diabetic kidney evaluation - Urine ACR  10/16/2023   FOOT EXAM  10/16/2023   OPHTHALMOLOGY EXAM  10/19/2023   PAP SMEAR-Modifier  03/21/2024   COLONOSCOPY (Pts 45-46yrs Insurance coverage will need to be confirmed)  03/17/2025   DTaP/Tdap/Td (2 - Td or Tdap) 10/13/2028   INFLUENZA VACCINE  Completed   Hepatitis C Screening  Completed   HIV  Screening  Completed   Zoster Vaccines- Shingrix  Completed   HPV VACCINES  Aged Out    Health Maintenance  Health Maintenance Due  Topic Date Due   COVID-19 Vaccine (6 - 2023-24 season) 07/06/2022   Medicare Annual Wellness (AWV)  07/09/2022    Colorectal cancer screening: Type of screening: Colonoscopy. Completed 03/18/2015. Repeat every 10 years  Mammogram status: Completed 07/12/2021. Repeat every year  Bone Density status: Ordered 11/09/2022. Pt provided with contact info and advised to call to schedule appt.  Lung Cancer Screening: (Low Dose CT Chest recommended if Age 45-80 years, 30 pack-year currently smoking OR have quit w/in 15years.) does not qualify.   Lung Cancer Screening Referral: N/A  Additional Screening:  Hepatitis C Screening: does qualify; Completed 11/15/2021  Vision Screening: Recommended annual ophthalmology exams for early detection of glaucoma and other disorders of the eye. Is the patient up to date with their annual eye exam?  Yes  Who is the provider or what is the name of the office in which the patient attends annual eye exams? [facility] If pt is not established with a provider, would they like to be referred to a provider to establish care? No .   Dental Screening: Recommended annual dental exams for proper oral hygiene  Community Resource Referral / Chronic Care Management: CRR required this visit?  No   CCM required this visit?  No      Plan:     I have personally reviewed and noted the following in the patient's chart:   Medical and social history Use of alcohol, tobacco or illicit drugs  Current medications and supplements including opioid prescriptions. Patient is currently taking opioid prescriptions. Information provided to patient regarding non-opioid alternatives. Patient advised to discuss non-opioid treatment plan with their provider. Functional ability and status Nutritional status Physical activity Advanced  directives List of other physicians Hospitalizations, surgeries, and ER visits in previous 12 months Vitals Screenings to include cognitive, depression, and falls Referrals and appointments  In addition, I have reviewed and discussed with patient certain preventive protocols, quality metrics, and best practice recommendations. A written personalized care plan for preventive services as well as general preventive health recommendations were provided to patient.     Ronette Deter, CMA   11/09/2022   Nurse Notes: I spent 30 minutes on this telephone encounter AVS mailed to patinet

## 2022-11-12 ENCOUNTER — Other Ambulatory Visit: Payer: Self-pay | Admitting: Internal Medicine

## 2022-11-12 DIAGNOSIS — R6883 Chills (without fever): Secondary | ICD-10-CM | POA: Diagnosis not present

## 2022-11-12 DIAGNOSIS — R059 Cough, unspecified: Secondary | ICD-10-CM | POA: Diagnosis not present

## 2022-11-12 DIAGNOSIS — E782 Mixed hyperlipidemia: Secondary | ICD-10-CM

## 2022-11-12 DIAGNOSIS — Z20822 Contact with and (suspected) exposure to covid-19: Secondary | ICD-10-CM | POA: Diagnosis not present

## 2022-11-13 DIAGNOSIS — R6883 Chills (without fever): Secondary | ICD-10-CM | POA: Diagnosis not present

## 2022-11-13 DIAGNOSIS — R059 Cough, unspecified: Secondary | ICD-10-CM | POA: Diagnosis not present

## 2022-11-13 DIAGNOSIS — Z20822 Contact with and (suspected) exposure to covid-19: Secondary | ICD-10-CM | POA: Diagnosis not present

## 2022-11-27 DIAGNOSIS — Z20822 Contact with and (suspected) exposure to covid-19: Secondary | ICD-10-CM | POA: Diagnosis not present

## 2022-11-27 DIAGNOSIS — R6883 Chills (without fever): Secondary | ICD-10-CM | POA: Diagnosis not present

## 2022-11-27 DIAGNOSIS — R059 Cough, unspecified: Secondary | ICD-10-CM | POA: Diagnosis not present

## 2022-11-29 DIAGNOSIS — R6883 Chills (without fever): Secondary | ICD-10-CM | POA: Diagnosis not present

## 2022-11-29 DIAGNOSIS — R059 Cough, unspecified: Secondary | ICD-10-CM | POA: Diagnosis not present

## 2022-11-29 DIAGNOSIS — Z20822 Contact with and (suspected) exposure to covid-19: Secondary | ICD-10-CM | POA: Diagnosis not present

## 2022-12-01 DIAGNOSIS — R6883 Chills (without fever): Secondary | ICD-10-CM | POA: Diagnosis not present

## 2022-12-01 DIAGNOSIS — R059 Cough, unspecified: Secondary | ICD-10-CM | POA: Diagnosis not present

## 2022-12-01 DIAGNOSIS — Z20822 Contact with and (suspected) exposure to covid-19: Secondary | ICD-10-CM | POA: Diagnosis not present

## 2022-12-02 DIAGNOSIS — R059 Cough, unspecified: Secondary | ICD-10-CM | POA: Diagnosis not present

## 2022-12-02 DIAGNOSIS — Z20822 Contact with and (suspected) exposure to covid-19: Secondary | ICD-10-CM | POA: Diagnosis not present

## 2022-12-02 DIAGNOSIS — R6883 Chills (without fever): Secondary | ICD-10-CM | POA: Diagnosis not present

## 2022-12-03 DIAGNOSIS — R059 Cough, unspecified: Secondary | ICD-10-CM | POA: Diagnosis not present

## 2022-12-03 DIAGNOSIS — R6883 Chills (without fever): Secondary | ICD-10-CM | POA: Diagnosis not present

## 2022-12-03 DIAGNOSIS — Z20822 Contact with and (suspected) exposure to covid-19: Secondary | ICD-10-CM | POA: Diagnosis not present

## 2022-12-06 DIAGNOSIS — R059 Cough, unspecified: Secondary | ICD-10-CM | POA: Diagnosis not present

## 2022-12-06 DIAGNOSIS — R6883 Chills (without fever): Secondary | ICD-10-CM | POA: Diagnosis not present

## 2022-12-06 DIAGNOSIS — Z20822 Contact with and (suspected) exposure to covid-19: Secondary | ICD-10-CM | POA: Diagnosis not present

## 2022-12-13 DIAGNOSIS — R6883 Chills (without fever): Secondary | ICD-10-CM | POA: Diagnosis not present

## 2022-12-13 DIAGNOSIS — R059 Cough, unspecified: Secondary | ICD-10-CM | POA: Diagnosis not present

## 2022-12-13 DIAGNOSIS — Z20822 Contact with and (suspected) exposure to covid-19: Secondary | ICD-10-CM | POA: Diagnosis not present

## 2022-12-15 DIAGNOSIS — R059 Cough, unspecified: Secondary | ICD-10-CM | POA: Diagnosis not present

## 2022-12-15 DIAGNOSIS — Z20822 Contact with and (suspected) exposure to covid-19: Secondary | ICD-10-CM | POA: Diagnosis not present

## 2022-12-15 DIAGNOSIS — R6883 Chills (without fever): Secondary | ICD-10-CM | POA: Diagnosis not present

## 2022-12-30 ENCOUNTER — Other Ambulatory Visit: Payer: Self-pay | Admitting: Internal Medicine

## 2022-12-30 DIAGNOSIS — J3089 Other allergic rhinitis: Secondary | ICD-10-CM

## 2022-12-31 NOTE — Telephone Encounter (Signed)
Requested Prescriptions  Pending Prescriptions Disp Refills   cetirizine (ZYRTEC) 10 MG tablet [Pharmacy Med Name: CETIRIZINE '10MG'$  TABLETS] 90 tablet 1    Sig: TAKE 1 TABLET(10 MG) BY MOUTH DAILY     Ear, Nose, and Throat:  Antihistamines 2 Passed - 12/30/2022  8:01 AM      Passed - Cr in normal range and within 360 days    Creatinine, Ser  Date Value Ref Range Status  06/14/2022 0.75 0.57 - 1.00 mg/dL Final         Passed - Valid encounter within last 12 months    Recent Outpatient Visits           2 months ago New onset type 2 diabetes mellitus ([place])   [place] Ladell Pier, MD   5 months ago Non-seasonal allergic rhinitis, unspecified trigger   [place], Enobong, MD   6 months ago New onset type 2 diabetes mellitus [facility])   [place] Karle Plumber B, MD   8 months ago Type 2 diabetes mellitus with hyperglycemia, without long-term current use of insulin [facility])   [place], Levada Dy M, [place]   10 months ago Essential hypertension   [place], MD       Future Appointments             In 1 month Wynetta Emery, Dalbert Batman, MD [place]

## 2023-01-02 ENCOUNTER — Other Ambulatory Visit: Payer: Self-pay | Admitting: Pulmonary Disease

## 2023-01-08 ENCOUNTER — Other Ambulatory Visit: Payer: Self-pay | Admitting: Internal Medicine

## 2023-01-08 DIAGNOSIS — K219 Gastro-esophageal reflux disease without esophagitis: Secondary | ICD-10-CM

## 2023-01-08 NOTE — Telephone Encounter (Signed)
Requested Prescriptions  Pending Prescriptions Disp Refills   omeprazole (PRILOSEC) 20 MG capsule [Pharmacy Med Name: OMEPRAZOLE '20MG'$  CAPSULES] 180 capsule 3    Sig: TAKE 1 CAPSULE(20 MG) BY MOUTH TWICE DAILY BEFORE A MEAL     Gastroenterology: Proton Pump Inhibitors Passed - 01/08/2023  6:25 AM      Passed - Valid encounter within last 12 months    Recent Outpatient Visits           2 months ago New onset type 2 diabetes mellitus ([place])   [place] Ladell Pier, MD   5 months ago Non-seasonal allergic rhinitis, unspecified trigger   [place], Enobong, MD   6 months ago New onset type 2 diabetes mellitus [facility])   [place] Karle Plumber B, MD   8 months ago Type 2 diabetes mellitus with hyperglycemia, without long-term current use of insulin [facility])   [place], Levada Dy M, [place]   11 months ago Essential hypertension   [place], MD       Future Appointments             In 1 month Wynetta Emery, Dalbert Batman, MD [place]

## 2023-01-18 DIAGNOSIS — M5136 Other intervertebral disc degeneration, lumbar region: Secondary | ICD-10-CM | POA: Diagnosis not present

## 2023-01-18 DIAGNOSIS — Z9689 Presence of other specified functional implants: Secondary | ICD-10-CM | POA: Diagnosis not present

## 2023-02-05 ENCOUNTER — Other Ambulatory Visit: Payer: Self-pay | Admitting: Internal Medicine

## 2023-02-05 DIAGNOSIS — R0982 Postnasal drip: Secondary | ICD-10-CM

## 2023-02-05 NOTE — Telephone Encounter (Signed)
Requested medications are due for refill today.  Unsure  Requested medications are on the active medications list.  yes  Last refill. 06/28/2022 #30 0 rf  Future visit scheduled.   yes  Notes to clinic.  Med list states that pt is no longer taking med.    Requested Prescriptions  Pending Prescriptions Disp Refills   benzonatate (TESSALON) 100 MG capsule [Pharmacy Med Name: BENZONATATE 100MG  CAPSULES] 30 capsule 0    Sig: TAKE 1 CAPSULE(100 MG) BY MOUTH TWICE DAILY AS NEEDED FOR COUGH     Ear, Nose, and Throat:  Antitussives/Expectorants Passed - 02/05/2023  8:38 AM      Passed - Valid encounter within last 12 months    Recent Outpatient Visits           3 months ago New onset type 2 diabetes mellitus ([place])   [place] Ladell Pier, MD   6 months ago Non-seasonal allergic rhinitis, unspecified trigger   [place], Enobong, MD   7 months ago New onset type 2 diabetes mellitus [facility])   [place] Karle Plumber B, MD   9 months ago Type 2 diabetes mellitus with hyperglycemia, without long-term current use of insulin [facility])   [place] [address] [place], Levada Dy M, [place]   11 months ago Essential hypertension   [place], MD       Future Appointments             In 1 week Ladell Pier, MD [place]

## 2023-02-09 ENCOUNTER — Other Ambulatory Visit: Payer: Self-pay | Admitting: Internal Medicine

## 2023-02-11 NOTE — Telephone Encounter (Signed)
Requested Prescriptions  Pending Prescriptions Disp Refills   albuterol (VENTOLIN HFA) 108 (90 Base) MCG/ACT inhaler [Pharmacy Med Name: ALBUTEROL HFA INH (200 PUFFS) 6.7GM] 6.7 g 0    Sig: INHALE 2 PUFFS INTO THE LUNGS EVERY 6 HOURS AS NEEDED FOR WHEEZING OR SHORTNESS OF BREATH     Pulmonology:  Beta Agonists 2 Passed - 02/09/2023  3:35 AM      Passed - Last BP in normal range    BP Readings from Last 1 Encounters:  10/15/22 105/73         Passed - Last Heart Rate in normal range    Pulse Readings from Last 1 Encounters:  10/15/22 84         Passed - Valid encounter within last 12 months    Recent Outpatient Visits           3 months ago New onset type 2 diabetes mellitus (H[facility])   [place] [facility] Marcine Matar, MD   6 months ago Non-seasonal allergic rhinitis, unspecified trigger   [place] [facility] [place], Odette Horns, MD   8 months ago New onset type 2 diabetes mellitus [facility])   [place] [facility] Jonah Blue B, MD   9 months ago Type 2 diabetes mellitus with hyperglycemia, without long-term current use of insulin [facility])   [place] [facility] [place], Marylene Land M, [place]   12 months ago Essential hypertension   [place] [facility] Marcine Matar, MD       Future Appointments             In 4 days Marcine Matar, MD [facility]

## 2023-02-15 ENCOUNTER — Encounter: Payer: Self-pay | Admitting: Internal Medicine

## 2023-02-15 ENCOUNTER — Ambulatory Visit: Payer: 59 | Attending: Internal Medicine | Admitting: Internal Medicine

## 2023-02-15 VITALS — BP 122/80 | HR 88 | Temp 98.6°F | Ht 64.0 in | Wt 144.0 lb

## 2023-02-15 DIAGNOSIS — F411 Generalized anxiety disorder: Secondary | ICD-10-CM

## 2023-02-15 DIAGNOSIS — I152 Hypertension secondary to endocrine disorders: Secondary | ICD-10-CM | POA: Diagnosis not present

## 2023-02-15 DIAGNOSIS — K5909 Other constipation: Secondary | ICD-10-CM | POA: Diagnosis not present

## 2023-02-15 DIAGNOSIS — E1159 Type 2 diabetes mellitus with other circulatory complications: Secondary | ICD-10-CM | POA: Diagnosis not present

## 2023-02-15 DIAGNOSIS — E785 Hyperlipidemia, unspecified: Secondary | ICD-10-CM | POA: Insufficient documentation

## 2023-02-15 DIAGNOSIS — E1169 Type 2 diabetes mellitus with other specified complication: Secondary | ICD-10-CM | POA: Diagnosis not present

## 2023-02-15 LAB — POCT GLYCOSYLATED HEMOGLOBIN (HGB A1C): HbA1c, POC (controlled diabetic range): 5.8 % (ref 0.0–7.0)

## 2023-02-15 LAB — GLUCOSE, POCT (MANUAL RESULT ENTRY): POC Glucose: 92 mg/dl (ref 70–99)

## 2023-02-15 MED ORDER — METFORMIN HCL 500 MG PO TABS: ORAL_TABLET | ORAL | 1 refills | Status: DC

## 2023-02-15 MED ORDER — HYDROXYZINE PAMOATE 25 MG PO CAPS
25.0000 mg | ORAL_CAPSULE | Freq: Every day | ORAL | 1 refills | Status: DC | PRN
Start: 2023-02-15 — End: 2023-03-15

## 2023-02-15 MED ORDER — ALBUTEROL SULFATE HFA 108 (90 BASE) MCG/ACT IN AERS: 2.0000 | INHALATION_SPRAY | Freq: Four times a day (QID) | RESPIRATORY_TRACT | 0 refills | Status: DC | PRN

## 2023-02-15 MED ORDER — LINACLOTIDE 290 MCG PO CAPS: ORAL_CAPSULE | ORAL | 1 refills | Status: DC

## 2023-02-15 NOTE — Patient Instructions (Signed)
Agoraphobia Agoraphobia is a mental health condition in which a person fears going out in public places where he or she may feel helpless, trapped, or embarrassed in the event of a panic attack. People with this condition have a fear of losing control during a panic attack, and they often start to avoid the situations that they fear or insist on having another person go with them. Agoraphobia may interfere with normal daily activities and personal relationships. People with severe agoraphobia may become completely homebound and dependent on others for daily tasks, such as grocery shopping and taking care of errands. Agoraphobia is a type of anxiety. It usually begins before age 35, but it can start in the older adult years. People with agoraphobia are at risk for other anxiety disorders, depression, and substance abuse. What are the causes? The cause of this condition is not known. A variety of factors such as fear of sensations and emotions in anxiety (anxiety sensitivity), a family history of anxiety, and stressful events may contribute to this condition. What increases the risk? You are more likely to develop this condition if: You are a woman. You have a panic disorder. You have family members with agoraphobia. What are the signs or symptoms? Symptoms of this condition include having any of the following for 6 months or longer: Intense fear arising from two or more of the following: Using public transportation, such as cars, buses, planes, trains, or ships. Being in open spaces, such as parking lots, shopping malls, or on bridges. Being in enclosed spaces, such as shops, theaters, or elevators. Standing in line or being in a crowd. Being outside the home alone. Fear of being unable to escape or get help if feared events occur. These events include: Panic attack. Loss of bowel control in older adults. Reacting to feared situations by: Avoiding them. Requiring the presence of a  companion. Enduring them with intense fear or anxiety. Fear or anxiety that is out of proportion to the actual danger that is posed by the event and the situation. How is this diagnosed? This condition is diagnosed based on: Your symptoms. You will be asked questions about your fears and how they have affected you. Your medical history and your use of medicines, alcohol, or drugs. Physical exam and lab tests. These are usually done to rule out other problems that may be causing your symptoms. You may be referred to a mental health specialist (psychiatrist or psychologist). How is this treated? This condition is usually treated using a combination of counseling and medicines. These may include: Counseling or talk therapy. Talk therapy is provided by mental health specialists. The following forms of talk therapy can be especially helpful: Cognitive behavioral therapy (CBT). CBT helps you recognize and change unrealistic thoughts and beliefs that contribute to your fears. You will learn that body changes associated with anxiety, such as increased heart rate and breathing, are completely normal and expected. Exposure therapy. This type of therapy helps you face and overcome your fears in a relaxed state and in a safe environment. Exposures are usually approached in a systematic way, starting with situations that provoke less fear and building up to situations that provoke more intense fear. Exposure therapy includes: Imagined exposure. You will imagine fearful situations and expose yourself to them in your mind. In vivo exposure. You will face your fears in the real world, such as by standing in a crowded place for a few minutes. Interoceptive exposure. In a safe environment, you will practice experiencing body   changes that are associated with panic attacks. One example is breathing through a straw to experience breathlessness. Medicines. The following types of medicines may be  helpful: Antidepressants. These can decrease general levels of anxiety and can help to prevent panic attacks. Benzodiazepines. These medicines block feelings of anxiety and panic. Follow these instructions at home: Lifestyle Try to exercise. Get 150 minutes or more of physical activity each week. Also, aim to do strengthening exercises two or more times a week. Eat a healthy diet that includes plenty of vegetables, fruits, whole grains, low-fat dairy products, and lean protein. Do not eat a lot of foods that are high in solid fats, added sugars, or salt (sodium). Get the right amount and quality of sleep. Most adults need 7-9 hours of sleep each night. Do not drink alcohol. Do not use illegal drugs or recreational drugs. General instructions Take over-the-counter and prescription medicines only as told by your health care provider. Keep all follow-up visits. This is important. Where to find more information Anxiety & Depression Association of America (ADAA): www.adaa.org Contact a health care provider if: Your fear or anxiety gets worse. You have new fears or anxieties. Get help right away if: You have trouble breathing or have chest pain that you believe may not be part of a panic attack. You have serious thoughts about hurting yourself or someone else. Get help right away if you feel like you may hurt yourself or others, or have thoughts about taking your own life. Go to your nearest emergency room or: Call 911. Call the National Suicide Prevention Lifeline at 1-800-273-8255 or 988. This is open 24 hours a day. Text the Crisis Text Line at 741741. These symptoms may be an emergency. Get help right away. Call 911. Do not wait to see if the symptoms will go away. Do not drive yourself to the hospital. Summary Agoraphobia is a type of anxiety disorder that causes a person to avoid situations that he or she fears, such as being in public or being in crowded spaces. People with agoraphobia  often have panic attacks. They may avoid situations in which they feel escape is difficult or panic attacks are likely to occur. Agoraphobia is treated with medicines or cognitive behavioral therapy (CBT). This information is not intended to replace advice given to you by your health care provider. Make sure you discuss any questions you have with your health care provider. Document Revised: 06/01/2021 Document Reviewed: 06/01/2021 Elsevier Patient Education  2023 Elsevier Inc.  

## 2023-02-15 NOTE — Progress Notes (Signed)
Patient ID: MAKEILA YAMAGUCHI, female    DOB: 03-17-1966  MRN: [id number]  CC: Diabetes (DM f/u. Med refills. /Pt reports experiencing high anxiety and anxiety attacks - would like to discuss meds.)   Subjective: Doryce Mcgregory is a 57 y.o. female who presents for chronic ds management Her concerns today include:  pt with hx of HTN, chronic constipation, anal fistulas, vertigo, HL, chronic back pain (followed by pain specialist, TENS unit placed 10/2022), hot flashes (on gabapentin) and GERD   Pt has meds with her today  C/o feeling of anxiety for past 2-3 mths Feels like she gets over anxious easily. If she is in the grocery store, she feels the urge to hurry up and get things done.  However, she denies feeling overwhelm when around other people.  Also feels overwhelm when watching a good movie she gets anxious because she feels she knows what will happen in the movie and wants it to go ahead and happen Cymbalta 30 mg on med list.  Looks like it was prescribed by GYN 04/2022 but pt does not have it and is not on it.  DM: Results for orders placed or performed in visit on 02/15/23  POCT glucose (manual entry)  Result Value Ref Range   POC Glucose 92 70 - 99 mg/dl  POCT glycosylated hemoglobin (Hb A1C)  Result Value Ref Range   Hemoglobin A1C     HbA1c POC (<> result, manual entry)     HbA1c, POC (prediabetic range)     HbA1c, POC (controlled diabetic range) 5.8 0.0 - 7.0 %  Reports compliance with metformin 500 mg daily Wgh up 6 lbs since last visit. Still trying to eat healthy but over ate at [place]. Plans to start walking now that it is spring  HTN: Reports compliance with Norvasc 10 mg  Limits salt  Not checking BP  HL: Reports compliance with and tolerating Crestor 20 mg daily.  Requests refill on Linzess for constipation.  Had TENS unit placed in lower back 10/19/2022 by pain specialist.  She is on gabapentin 300 mg twice a day, cyclobenzaprine and oxycodone through her pain  specialist.    Patient Active Problem List   Diagnosis Date Noted   New onset type 2 diabetes mellitus 02/14/2022   Altered consciousness 12/10/2018   Carpal tunnel syndrome of right wrist 06/12/2018   Postnasal drip 06/12/2018   Vertigo 10/18/2016   Disturbance of skin sensation 04/19/2014   Anal fistula 04/14/2013   UNSPECIFIED SITE OF SPRAIN AND STRAIN 07/07/2009   PEDAL EDEMA 06/29/2009   MUSCLE PAIN 06/23/2009   ANEMIA-IRON DEFICIENCY 03/11/2009   Allergic rhinitis 03/11/2009   SYNCOPE, VASOVAGAL 03/11/2009   HEADACHE 03/11/2009     Current Outpatient Medications on File Prior to Visit  Medication Sig Dispense Refill   albuterol (VENTOLIN HFA) 108 (90 Base) MCG/ACT inhaler INHALE 2 PUFFS INTO THE LUNGS EVERY 6 HOURS AS NEEDED FOR WHEEZING OR SHORTNESS OF BREATH 6.7 g 0   amLODipine (NORVASC) 10 MG tablet TAKE 1 TABLET(10 MG) BY MOUTH DAILY 90 tablet 3   benzonatate (TESSALON) 100 MG capsule TAKE 1 CAPSULE(100 MG) BY MOUTH TWICE DAILY AS NEEDED FOR COUGH 30 capsule 0   Blood Pressure Monitor DEVI Use as directed to check home blood pressure 2-3 times a week 1 Device 0   cetirizine (ZYRTEC) 10 MG tablet TAKE 1 TABLET(10 MG) BY MOUTH DAILY 90 tablet 1   diclofenac sodium (VOLTAREN) 1 % GEL Apply 2 g topically  4 (four) times daily. 100 g 0   diphenhydrAMINE (BENADRYL) 25 MG tablet Take 1 tablet (25 mg total) by mouth every 6 (six) hours as needed. 20 tablet 0   DULoxetine (CYMBALTA) 30 MG capsule Take 1 capsule by mouth at bedtime.     EPINEPHrine (EPIPEN 2-PAK) 0.3 mg/0.3 mL IJ SOAJ injection Inject 0.3 mLs (0.3 mg total) into the muscle as needed for anaphylaxis. 1 each 0   fluticasone (FLONASE) 50 MCG/ACT nasal spray Place 1 spray into both nostrils daily. 16 g 2   fluticasone (FLONASE) 50 MCG/ACT nasal spray Place 1 spray into both nostrils daily. 16 g 3   gabapentin (NEURONTIN) 300 MG capsule Take 1 capsule (300 mg total) by mouth at bedtime. 30 capsule 3   LINZESS 290 MCG  CAPS capsule TAKE 1 CAPSULE(290 MCG) BY MOUTH DAILY BEFORE BREAKFAST 90 capsule 0   meclizine (ANTIVERT) 25 MG tablet TAKE 1 TABLET(25 MG) BY MOUTH TWICE DAILY AS NEEDED FOR DIZZINESS 20 tablet 0   metFORMIN (GLUCOPHAGE) 500 MG tablet TAKE 1 TABLET(500 MG) BY MOUTH DAILY WITH BREAKFAST 90 tablet 1   montelukast (SINGULAIR) 10 MG tablet Take 1 tablet (10 mg total) by mouth at bedtime. 30 tablet 5   omeprazole (PRILOSEC) 20 MG capsule TAKE 1 CAPSULE(20 MG) BY MOUTH TWICE DAILY BEFORE A MEAL 180 capsule 3   oxycodone (OXY-IR) 5 MG capsule Take 5 mg by mouth every 8 (eight) hours as needed.     promethazine (PHENERGAN) 25 MG tablet TAKE 1 TABLET(25 MG) BY MOUTH EVERY 8 HOURS AS NEEDED FOR NAUSEA OR VOMITING 20 tablet 0   rosuvastatin (CRESTOR) 20 MG tablet TAKE 1 TABLET(20 MG) BY MOUTH DAILY 30 tablet 3   No current facility-administered medications on file prior to visit.    Allergies  Allergen Reactions   Ampicillin Anaphylaxis   Atorvastatin Calcium Other (See Comments)    Pt reported lip swelling   Penicillins Anaphylaxis   Sulfonamide Derivatives Anaphylaxis   Hydrocodone-Acetaminophen Hives    Have to take a Benadryl to take med   Tramadol Itching   Tylenol [Acetaminophen] Itching    Social History   Socioeconomic History   Marital status: Single    Spouse name: Not on file   Number of children: 2   Years of education: 10   Highest education level: Not on file  Occupational History   Occupation: N/A  Tobacco Use   Smoking status: Never   Smokeless tobacco: Never  Vaping Use   Vaping Use: Never used  Substance and Sexual Activity   Alcohol use: Never    Comment: occa   Drug use: No   Sexual activity: Yes  Other Topics Concern   Not on file  Social History Narrative   Lives at home w/ her mother   Right-handed   Caffeine: 1 cup of tea per day   Social Determinants of Health   Financial Resource Strain: Low Risk  (11/09/2022)   Overall Financial Resource Strain  (CARDIA)    Difficulty of Paying Living Expenses: Not hard at all  Food Insecurity: No Food Insecurity (11/09/2022)   Hunger Vital Sign    Worried About Running Out of Food in the Last Year: Never true    Ran Out of Food in the Last Year: Never true  Transportation Needs: No Transportation Needs (11/09/2022)   PRAPARE - Administrator, Civil Service (Medical): No    Lack of Transportation (Non-Medical): No  Physical Activity: Insufficiently Active (  11/09/2022)   Exercise Vital Sign    Days of Exercise per Week: 7 days    Minutes of Exercise per Session: 10 min  Stress: No Stress Concern Present (11/09/2022)   [employer] of Occupational Health - Occupational Stress Questionnaire    Feeling of Stress : Not at all  Social Connections: Socially Isolated (11/09/2022)   Social Connection and Isolation Panel [NHANES]    Frequency of Communication with Friends and Family: More than three times a week    Frequency of Social Gatherings with Friends and Family: More than three times a week    Attends Religious Services: Never    Database administrator or Organizations: No    Attends Banker Meetings: Never    Marital Status: Never married  Intimate Partner Violence: Not At Risk (11/09/2022)   Humiliation, Afraid, Rape, and Kick questionnaire    Fear of Current or Ex-Partner: No    Emotionally Abused: No    Physically Abused: No    Sexually Abused: No    Family History  Problem Relation Age of Onset   Diabetes Mother    Hypertension Mother    Heart attack Father    Seizures Sister    Seizures Sister    Diabetes Brother    Hypertension Brother    Diabetes Maternal Aunt    Diabetes Maternal Uncle    Hypertension Maternal Uncle    Hypertension Son    Colon cancer Neg Hx    Stomach cancer Neg Hx     Past Surgical History:  Procedure Laterality Date   ANAL FISTULECTOMY  08/22/2012   Procedure: FISTULECTOMY ANAL;  Surgeon: Lodema Pilot, DO;  Location: [facility];   Service: General;  Laterality: N/A;  rectal examination under anesthesia with seton placement    ANAL FISTULECTOMY  09/17/2012   Procedure: FISTULECTOMY ANAL;  Surgeon: Lodema Pilot, DO;  Location: [facility];  Service: General;  Laterality: N/A;  possible fistulotomy   BACK SURGERY  10/19/2022   D & C HYSTEROSCOPY/ NOVASURE ENDOMETRIAL ABLATION/ I & D THROMBOSED HEMORROID  01-10-2007  DR Isaias Cowman ROSS   EXAMINATION UNDER ANESTHESIA  09/17/2012   Procedure: EXAM UNDER ANESTHESIA;  Surgeon: Lodema Pilot, DO;  Location: [facility];  Service: General;  Laterality: N/A;  Rectal exam under anesthesia   EXAMINATION UNDER ANESTHESIA N/A 05/14/2013   Procedure: EXAM UNDER ANESTHESIA;  Surgeon: Romie Levee, MD;  Location: [facility];  Service: General;  Laterality: N/A;   FISTULA PLUG  09/17/2012   Procedure: FISTULA PLUG;  Surgeon: Lodema Pilot, DO;  Location: [facility];  Service: General;  Laterality: N/A;  Possible fistula plug   HEMORRHOID SURGERY  04/28/2009   [facility] INTERNAL HEMORRHOIDS   IRRIGATION AND DEBRIDEMENT ABSCESS  07/08/2012   Procedure: MINOR INCISION AND DRAINAGE OF ABSCESS;  Surgeon: Geryl Rankins, MD;  Location: [facility];  Service: Gynecology;  Laterality: N/A;  Vulvar Abscess   PLACEMENT OF SETON N/A 05/14/2013   Procedure:  PLACEMENT OF SETON;  Surgeon: Romie Levee, MD;  Location: [facility];  Service: General;  Laterality: N/A;   RECTAL EXAM UNDER ANESTHESIA N/A 08/20/2013   Procedure: mucosal advancement flap ;  Surgeon: Romie Levee, MD;  Location: [facility];  Service: General;  Laterality: N/A;  parks anal retractor long rectal instrucments prone jack knife anal fistula    RECTAL ULTRASOUND N/A 07/20/2013   Procedure: RECTAL ULTRASOUND;  Surgeon: Romie Levee, MD;  Location: [facility];  Service: Endoscopy;  Laterality: N/A;    ROS: Review of Systems Negative except as stated  above  PHYSICAL EXAM: BP 122/80 (BP Location: Left Arm, Patient Position: Sitting, Cuff Size: Normal)   Pulse 88   Temp 98.6 F (37 C) (Oral)   Ht 5\' 4"  (1.626 m)   Wt 144 lb (65.3 kg)   SpO2 98%   BMI 24.72 kg/m   Wt Readings from Last 3 Encounters:  02/15/23 144 lb (65.3 kg)  10/15/22 138 lb (62.6 kg)  06/14/22 141 lb 6.4 oz (64.1 kg)    Physical Exam  General appearance - alert, well appearing, middle age AAF and in no distress Neck - supple, no significant adenopathy Chest - clear to auscultation, no wheezes, rales or rhonchi, symmetric air entry Heart - normal rate, regular rhythm, normal S1, S2, no murmurs, rubs, clicks or gallops Extremities - peripheral pulses normal, no pedal edema, no clubbing or cyanosis     02/15/2023   10:36 AM 11/09/2022   10:20 AM 11/09/2022   10:19 AM  Depression screen PHQ 2/9  Decreased Interest 0 0 0  Down, Depressed, Hopeless 0 0 0  PHQ - 2 Score 0 0 0  Altered sleeping 0    Tired, decreased energy 3    Change in appetite 0    Feeling bad or failure about yourself  0    Trouble concentrating 0    Moving slowly or fidgety/restless 0    Suicidal thoughts 0    PHQ-9 Score 3        02/15/2023   10:37 AM 10/15/2022   10:37 AM 06/14/2022   10:52 AM 02/12/2022    8:58 AM  GAD 7 : Generalized Anxiety Score  Nervous, Anxious, on Edge 0 0 0 0  Control/stop worrying 0 0 0 0  Worry too much - different things 0 0 0 0  Trouble relaxing 0 0 0 0  Restless 0 0 0 0  Easily annoyed or irritable 0 0 0 0  Afraid - awful might happen 0 0 0 0  Total GAD 7 Score 0 0 0 0        Latest Ref Rng & Units 06/14/2022   12:02 PM 02/12/2022    9:37 AM 06/13/2021    4:11 PM  CMP  Glucose 70 - 99 mg/dL 91  201  95   BUN 6 - 24 mg/dL 8  8  9    Creatinine 0.57 - 1.00 mg/dL 0.07  1.21  9.75   Sodium 134 - 144 mmol/L 141  144  145   Potassium 3.5 - 5.2 mmol/L 4.5  4.2  4.1   Chloride 96 - 106 mmol/L 99  102  102   CO2 20 - 29 mmol/L 26  26  24    Calcium  8.7 - 10.2 mg/dL 88.3  9.4  25.4   Total Protein 6.0 - 8.5 g/dL  7.3    Total Bilirubin 0.0 - 1.2 mg/dL  0.3    Alkaline Phos 44 - 121 IU/L  84    AST 0 - 40 IU/L  17    ALT 0 - 32 IU/L  17     Lipid Panel     Component Value Date/Time   CHOL 170 02/12/2022 0937   TRIG 107 02/12/2022 0937   HDL 58 02/12/2022 0937   CHOLHDL 2.9 02/12/2022 0937   CHOLHDL 3.8 03/19/2009 0414   VLDL 13 03/19/2009 0414   LDLCALC 93  02/12/2022 0937    CBC    Component Value Date/Time   WBC 5.1 10/15/2022 1132   WBC 5.5 04/01/2019 1054   RBC 4.56 10/15/2022 1132   RBC 4.34 04/01/2019 1054   HGB 13.5 10/15/2022 1132   HGB 13.8 09/05/2009 1347   HCT 40.4 10/15/2022 1132   HCT 41.3 09/05/2009 1347   PLT 304 10/15/2022 1132   MCV 89 10/15/2022 1132   MCV 92.0 09/05/2009 1347   MCH 29.6 10/15/2022 1132   MCH 29.5 04/01/2019 1054   MCHC 33.4 10/15/2022 1132   MCHC 32.3 04/01/2019 1054   RDW 12.3 10/15/2022 1132   RDW 13.0 09/05/2009 1347   LYMPHSABS 2.2 04/01/2019 1054   LYMPHSABS 2.9 03/16/2019 1427   LYMPHSABS 2.1 09/05/2009 1347   MONOABS 0.4 04/01/2019 1054   MONOABS 0.3 09/05/2009 1347   EOSABS 0.1 04/01/2019 1054   EOSABS 0.1 03/16/2019 1427   BASOSABS 0.1 04/01/2019 1054   BASOSABS 0.1 03/16/2019 1427   BASOSABS 0.1 09/05/2009 1347    ASSESSMENT AND PLAN: 1. Type 2 diabetes mellitus with hyperlipidemia At goal.  Continue metformin, healthy eating habits.  Encouraged her to try to exercise at least 3 to 5 days a week for 30 minutes. - POCT glucose (manual entry) - POCT glycosylated hemoglobin (Hb A1C) - metFORMIN (GLUCOPHAGE) 500 MG tablet; TAKE 1 TABLET(500 MG) BY MOUTH DAILY WITH BREAKFAST  Dispense: 90 tablet; Refill: 1  2. Hypertension associated with type 2 diabetes mellitus At goal.  Continue Norvasc 10 mg daily.  3. Anxiety state Discussed ways to help decrease anxiety including deep breathing exercises and exercise.  Also advised her to change to channel if she is  watching a program that she finds emotionally upsetting.  She is wanting a medication that would help with this.  However she does not want to have to take the medicine every day.  We discussed putting her on hydroxyzine to take as needed.  Advised that the medication can cause some drowsiness.  Patient also feels she would benefit from seeing a counselor.  We have given her printed material on behavioral health counselors in the [place] area so that she can call a few of them to try to get established. - hydrOXYzine (VISTARIL) 25 MG capsule; Take 1 capsule (25 mg total) by mouth daily as needed for anxiety.  Dispense: 30 capsule; Refill: 1  4. Chronic constipation - linaclotide (LINZESS) 290 MCG CAPS capsule; TAKE 1 CAPSULE(290 MCG) BY MOUTH DAILY BEFORE BREAKFAST  Dispense: 90 capsule; Refill: 1     Patient was given the opportunity to ask questions.  Patient verbalized understanding of the plan and was able to repeat key elements of the plan.   This documentation was completed using Paediatric nurse.  Any transcriptional errors are unintentional.  Orders Placed This Encounter  Procedures   POCT glucose (manual entry)   POCT glycosylated hemoglobin (Hb A1C)     Requested Prescriptions   Pending Prescriptions Disp Refills   linaclotide (LINZESS) 290 MCG CAPS capsule 90 capsule 0   metFORMIN (GLUCOPHAGE) 500 MG tablet 90 tablet 1   albuterol (VENTOLIN HFA) 108 (90 Base) MCG/ACT inhaler 6.7 g 0    Sig: Inhale 2 puffs into the lungs every 6 (six) hours as needed for wheezing or shortness of breath.    No follow-ups on file.  Jonah Blue, MD, FACP

## 2023-03-14 ENCOUNTER — Ambulatory Visit: Payer: Self-pay

## 2023-03-14 NOTE — Telephone Encounter (Signed)
  Chief Complaint: Burning with urination. Symptoms: above - possible blood in urine - possible fever at night Frequency: yesterday Pertinent Negatives: Patient denies  Disposition: [] ED /[x] Urgent Care (no appt availability in office) / [] Appointment(In office/virtual)/ []  [facility]/ [] Home Care/ [] Refused Recommended Disposition /[] [facility]/ []  Follow-up with PCP Additional Notes: Pt states that  burning with urination started last night. Pt states that sometimes at night she wakes up hot and her sheet s are wet. Pt is unsure if this is night sweats or fever. Pt also states that she may have blood in her urine. She is unsure where blood is coming from. No OV available. PT will go to UC.  Summary: burns when urinates   Pt called saying it burns when she urinates.  There are no appts at [facility] until the 23rd   CB#  [phone]     Reason for Disposition  Blood in urine (red, pink, or tea-colored)  Answer Assessment - Initial Assessment Questions 1. SEVERITY: "How bad is the pain?"  (e.g., Scale 1-10; mild, moderate, or severe)   - MILD (1-3): complains slightly about urination hurting   - MODERATE (4-7): interferes with normal activities     - SEVERE (8-10): excruciating, unwilling or unable to urinate because of the pain      8/10 2. FREQUENCY: "How many times have you had painful urination today?"      Last night - yes 3. PATTERN: "Is pain present every time you urinate or just sometimes?"      Every time 4. ONSET: "When did the painful urination start?"      yesterday 5. FEVER: "Do you have a fever?" If Yes, ask: "What is your temperature, how was it measured, and when did it start?"     no 6. PAST UTI: "Have you had a urine infection before?" If Yes, ask: "When was the last time?" and "What happened that time?"      yes 7. CAUSE: "What do you think is causing the painful urination?"  (e.g., UTI, scratch, Herpes sore)     UTI 8. OTHER SYMPTOMS: "Do you  have any other symptoms?" (e.g., blood in urine, flank pain, genital sores, urgency, vaginal discharge)    Blood  Protocols used: Urination Pain - Female-A-AH

## 2023-03-14 NOTE — Telephone Encounter (Signed)
Summary: burns when urinates   Pt called saying it burns when she urinates.  There are no appts at [facility] until the 23rd   CB#  [phone]      Called pt - left message to return call.

## 2023-03-14 NOTE — Telephone Encounter (Signed)
Summary: burns when urinates   Pt called saying it burns when she urinates.  There are no appts at [facility] until the 23rd   CB#  [phone]      Called pt - left message on machine to return our call.

## 2023-03-14 NOTE — Telephone Encounter (Signed)
Pt has been scheduled for a nurse visit.

## 2023-03-15 ENCOUNTER — Ambulatory Visit: Payer: 59 | Attending: Internal Medicine

## 2023-03-15 ENCOUNTER — Other Ambulatory Visit: Payer: Self-pay | Admitting: Internal Medicine

## 2023-03-15 ENCOUNTER — Ambulatory Visit (HOSPITAL_BASED_OUTPATIENT_CLINIC_OR_DEPARTMENT_OTHER): Payer: 59 | Admitting: Internal Medicine

## 2023-03-15 ENCOUNTER — Telehealth: Payer: Self-pay

## 2023-03-15 DIAGNOSIS — Z87898 Personal history of other specified conditions: Secondary | ICD-10-CM | POA: Diagnosis not present

## 2023-03-15 DIAGNOSIS — R399 Unspecified symptoms and signs involving the genitourinary system: Secondary | ICD-10-CM | POA: Diagnosis not present

## 2023-03-15 DIAGNOSIS — R300 Dysuria: Secondary | ICD-10-CM

## 2023-03-15 DIAGNOSIS — F411 Generalized anxiety disorder: Secondary | ICD-10-CM

## 2023-03-15 LAB — POCT URINALYSIS DIP (CLINITEK)
Bilirubin, UA: NEGATIVE
Glucose, UA: NEGATIVE mg/dL
Ketones, POC UA: NEGATIVE mg/dL
Nitrite, UA: NEGATIVE
POC PROTEIN,UA: 100 — AB
Spec Grav, UA: 1.025 (ref 1.010–1.025)
Urobilinogen, UA: 0.2 E.U./dL
pH, UA: 6 (ref 5.0–8.0)

## 2023-03-15 MED ORDER — CIPROFLOXACIN HCL 500 MG PO TABS
500.0000 mg | ORAL_TABLET | Freq: Two times a day (BID) | ORAL | 0 refills | Status: AC
Start: 2023-03-15 — End: 2023-03-20

## 2023-03-15 NOTE — Progress Notes (Signed)
Patient ID: [name], female   DOB: 07-26-1966, 57 y.o.   MRN: [id number] Virtual Visit via Telephone Note  I connected with [name] on 03/15/2023 at 1:54 p.m by telephone and verified that I am speaking with the correct person using two identifiers  Location: Patient: home Provider: office  Participants: Myself Patient   I discussed the limitations, risks, security and privacy concerns of performing an evaluation and management service by telephone and the availability of in person appointments. I also discussed with the patient that there may be a patient responsible charge related to this service. The patient expressed understanding and agreed to proceed.   History of Present Illness: pt with hx of HTN, chronic constipation, anal fistulas, vertigo, HL, chronic back pain (followed by pain specialist, TENS unit placed 10/2022), hot flashes (on gabapentin) and GERD   Pt c/o burning with urination x 3 days.   Noticed blood blood when she wiped post urination x 2.  However she does not see any gross blood in the toilet or the urine appearing red.  She is not sure whether she has had fever but states that she felt hot a few nights ago. She came to the lab this morning to drop off a urine sample.  This revealed moderate amount of blood and small amount of leukocyte esterase. Outpatient Encounter Medications as of 03/15/2023  Medication Sig Note   albuterol (VENTOLIN HFA) 108 (90 Base) MCG/ACT inhaler Inhale 2 puffs into the lungs every 6 (six) hours as needed for wheezing or shortness of breath.    amLODipine (NORVASC) 10 MG tablet TAKE 1 TABLET(10 MG) BY MOUTH DAILY    benzonatate (TESSALON) 100 MG capsule TAKE 1 CAPSULE(100 MG) BY MOUTH TWICE DAILY AS NEEDED FOR COUGH    Blood Pressure Monitor DEVI Use as directed to check home blood pressure 2-3 times a week    cetirizine (ZYRTEC) 10 MG tablet TAKE 1 TABLET(10 MG) BY MOUTH DAILY    cyclobenzaprine (FLEXERIL) 10 MG tablet Take 10 mg by  mouth 3 (three) times daily.    diclofenac sodium (VOLTAREN) 1 % GEL Apply 2 g topically 4 (four) times daily.    EPINEPHrine (EPIPEN 2-PAK) 0.3 mg/0.3 mL IJ SOAJ injection Inject 0.3 mLs (0.3 mg total) into the muscle as needed for anaphylaxis. 08/22/2021: On hand   fluticasone (FLONASE) 50 MCG/ACT nasal spray Place 1 spray into both nostrils daily.    fluticasone (FLONASE) 50 MCG/ACT nasal spray Place 1 spray into both nostrils daily.    gabapentin (NEURONTIN) 300 MG capsule Take 1 capsule (300 mg total) by mouth at bedtime. (Patient taking differently: Take 300 mg by mouth 2 (two) times daily.)    hydrOXYzine (VISTARIL) 25 MG capsule TAKE 1 CAPSULE(25 MG) BY MOUTH DAILY AS NEEDED FOR ANXIETY    linaclotide (LINZESS) 290 MCG CAPS capsule TAKE 1 CAPSULE(290 MCG) BY MOUTH DAILY BEFORE BREAKFAST    meclizine (ANTIVERT) 25 MG tablet TAKE 1 TABLET(25 MG) BY MOUTH TWICE DAILY AS NEEDED FOR DIZZINESS    metFORMIN (GLUCOPHAGE) 500 MG tablet TAKE 1 TABLET(500 MG) BY MOUTH DAILY WITH BREAKFAST    montelukast (SINGULAIR) 10 MG tablet Take 1 tablet (10 mg total) by mouth at bedtime.    omeprazole (PRILOSEC) 20 MG capsule TAKE 1 CAPSULE(20 MG) BY MOUTH TWICE DAILY BEFORE A MEAL    oxycodone (OXY-IR) 5 MG capsule Take 5 mg by mouth every 8 (eight) hours as needed.    promethazine (PHENERGAN) 25 MG tablet TAKE  1 TABLET(25 MG) BY MOUTH EVERY 8 HOURS AS NEEDED FOR NAUSEA OR VOMITING    rosuvastatin (CRESTOR) 20 MG tablet TAKE 1 TABLET(20 MG) BY MOUTH DAILY    No facility-administered encounter medications on file as of 03/15/2023.      Observations/Objective:  Results for orders placed or performed in visit on 03/15/23  POCT URINALYSIS DIP (CLINITEK)  Result Value Ref Range   Color, UA straw (A) yellow   Clarity, UA cloudy (A) clear   Glucose, UA negative negative mg/dL   Bilirubin, UA negative negative   Ketones, POC UA negative negative mg/dL   Spec Grav, UA 1.610 9.604 - 1.025   Blood, UA moderate  (A) negative   pH, UA 6.0 5.0 - 8.0   POC PROTEIN,UA =100 (A) negative, trace   Urobilinogen, UA 0.2 0.2 or 1.0 E.U./dL   Nitrite, UA Negative Negative   LEUKOCYTES small     Assessment and Plan: 1. Dysuria 2. History of gross hematuria -Urine showed small leukocytes esterase but negative nitrates.  However given her symptoms, we will treat for UTI.  Urine culture sent.  After she has completed the course of antibiotics, I request that she return to the lab to give a urine sample for [place] to make sure that the hematuria resolves. - ciprofloxacin (CIPRO) 500 MG tablet; Take 1 tablet (500 mg total) by mouth 2 (two) times daily for 5 days.  Dispense: 10 tablet; Refill: 0 - Urine Culture     Follow Up Instructions: As previously scheduled   I discussed the assessment and treatment plan with the patient. The patient was provided an opportunity to ask questions and all were answered. The patient agreed with the plan and demonstrated an understanding of the instructions.   The patient was advised to call back or seek an in-person evaluation if the symptoms worsen or if the condition fails to improve as anticipated.  I  Spent 5 minutes on this telephone encounter  This note has been created with Education officer, environmental. Any transcriptional errors are unintentional.  Jonah Blue, MD

## 2023-03-15 NOTE — Telephone Encounter (Signed)
Pt dropped of urine sample for UTI screening results are in her chart. I informed her that someone will call her with the results.

## 2023-03-15 NOTE — Progress Notes (Signed)
Pt arrived for UTI testing Results were routed to PCP

## 2023-03-15 NOTE — Telephone Encounter (Signed)
PCP has spoken to patient.

## 2023-03-18 LAB — URINE CULTURE

## 2023-03-19 LAB — URINE CULTURE

## 2023-03-22 DIAGNOSIS — Z79891 Long term (current) use of opiate analgesic: Secondary | ICD-10-CM | POA: Diagnosis not present

## 2023-03-22 DIAGNOSIS — Z9689 Presence of other specified functional implants: Secondary | ICD-10-CM | POA: Diagnosis not present

## 2023-03-22 DIAGNOSIS — M5416 Radiculopathy, lumbar region: Secondary | ICD-10-CM | POA: Diagnosis not present

## 2023-03-22 DIAGNOSIS — M5136 Other intervertebral disc degeneration, lumbar region: Secondary | ICD-10-CM | POA: Diagnosis not present

## 2023-03-25 ENCOUNTER — Ambulatory Visit: Payer: 59

## 2023-03-25 ENCOUNTER — Telehealth: Payer: Self-pay

## 2023-03-25 DIAGNOSIS — R300 Dysuria: Secondary | ICD-10-CM

## 2023-03-25 LAB — POCT URINALYSIS DIP (CLINITEK)
Bilirubin, UA: NEGATIVE
Blood, UA: NEGATIVE
Glucose, UA: NEGATIVE mg/dL
Ketones, POC UA: NEGATIVE mg/dL
Leukocytes, UA: NEGATIVE
Nitrite, UA: NEGATIVE
POC PROTEIN,UA: 30 — AB
Spec Grav, UA: >=1.03 — AB (ref 1.010–1.025)
Urobilinogen, UA: 0.2 E.U./dL
pH, UA: 6 (ref 5.0–8.0)

## 2023-03-25 NOTE — Progress Notes (Signed)
Pt returned to office for UA after finishing antibiotics.

## 2023-03-25 NOTE — Telephone Encounter (Signed)
Pt dropped off urine sample for testing, pt has completed the antibiotics.  Results are in chart.

## 2023-03-27 NOTE — Telephone Encounter (Signed)
Called LVM to call back

## 2023-03-28 ENCOUNTER — Telehealth: Payer: Self-pay

## 2023-03-28 NOTE — Telephone Encounter (Signed)
Pt returned call from [place]. I do not see a note from provider or results to share with Pt. Pt will be home the rest of the day. Please advise.

## 2023-03-28 NOTE — Telephone Encounter (Signed)
Called LVM to call back

## 2023-03-29 NOTE — Telephone Encounter (Signed)
Called LVM to call back

## 2023-03-29 NOTE — Telephone Encounter (Signed)
Pt was left a VM with results from urine sample

## 2023-04-02 DIAGNOSIS — R69 Illness, unspecified: Secondary | ICD-10-CM | POA: Diagnosis not present

## 2023-04-19 DIAGNOSIS — M5136 Other intervertebral disc degeneration, lumbar region: Secondary | ICD-10-CM | POA: Diagnosis not present

## 2023-04-19 DIAGNOSIS — M5416 Radiculopathy, lumbar region: Secondary | ICD-10-CM | POA: Diagnosis not present

## 2023-04-19 DIAGNOSIS — Z9689 Presence of other specified functional implants: Secondary | ICD-10-CM | POA: Diagnosis not present

## 2023-05-03 ENCOUNTER — Encounter (HOSPITAL_COMMUNITY): Payer: Self-pay

## 2023-05-03 ENCOUNTER — Emergency Department (HOSPITAL_COMMUNITY)
Admission: EM | Admit: 2023-05-03 | Discharge: 2023-05-03 | Disposition: A | Discharge status: Home / Self Care | Payer: 59 | Attending: Emergency Medicine | Admitting: Emergency Medicine

## 2023-05-03 ENCOUNTER — Emergency Department (HOSPITAL_COMMUNITY): Payer: 59

## 2023-05-03 DIAGNOSIS — R1013 Epigastric pain: Secondary | ICD-10-CM | POA: Diagnosis not present

## 2023-05-03 DIAGNOSIS — R1031 Right lower quadrant pain: Secondary | ICD-10-CM | POA: Insufficient documentation

## 2023-05-03 DIAGNOSIS — E119 Type 2 diabetes mellitus without complications: Secondary | ICD-10-CM | POA: Diagnosis not present

## 2023-05-03 DIAGNOSIS — Z7984 Long term (current) use of oral hypoglycemic drugs: Secondary | ICD-10-CM | POA: Insufficient documentation

## 2023-05-03 DIAGNOSIS — R748 Abnormal levels of other serum enzymes: Secondary | ICD-10-CM

## 2023-05-03 DIAGNOSIS — Z79899 Other long term (current) drug therapy: Secondary | ICD-10-CM | POA: Diagnosis not present

## 2023-05-03 DIAGNOSIS — R7401 Elevation of levels of liver transaminase levels: Secondary | ICD-10-CM | POA: Insufficient documentation

## 2023-05-03 DIAGNOSIS — R109 Unspecified abdominal pain: Secondary | ICD-10-CM | POA: Diagnosis not present

## 2023-05-03 LAB — CBC WITH DIFFERENTIAL/PLATELET
Abs Immature Granulocytes: 0 10*3/uL (ref 0.00–0.07)
Basophils Absolute: 0 10*3/uL (ref 0.0–0.1)
Basophils Relative: 1 %
Eosinophils Absolute: 0.1 10*3/uL (ref 0.0–0.5)
Eosinophils Relative: 3 %
HCT: 38.5 % (ref 36.0–46.0)
Hemoglobin: 12.6 g/dL (ref 12.0–15.0)
Immature Granulocytes: 0 %
Lymphocytes Relative: 54 %
Lymphs Abs: 2.2 10*3/uL (ref 0.7–4.0)
MCH: 29.2 pg (ref 26.0–34.0)
MCHC: 32.7 g/dL (ref 30.0–36.0)
MCV: 89.1 fL (ref 80.0–100.0)
Monocytes Absolute: 0.2 10*3/uL (ref 0.1–1.0)
Monocytes Relative: 5 %
Neutro Abs: 1.5 10*3/uL — ABNORMAL LOW (ref 1.7–7.7)
Neutrophils Relative %: 37 %
Platelets: UNDETERMINED 10*3/uL (ref 150–400)
RBC: 4.32 MIL/uL (ref 3.87–5.11)
RDW: 12.3 % (ref 11.5–15.5)
WBC: 4.1 10*3/uL (ref 4.0–10.5)
nRBC: 0 % (ref 0.0–0.2)

## 2023-05-03 LAB — COMPREHENSIVE METABOLIC PANEL
ALT: 397 U/L — ABNORMAL HIGH (ref 0–44)
AST: 381 U/L — ABNORMAL HIGH (ref 15–41)
Albumin: 3.9 g/dL (ref 3.5–5.0)
Alkaline Phosphatase: 118 U/L (ref 38–126)
Anion gap: 13 (ref 5–15)
BUN: 9 mg/dL (ref 6–20)
CO2: 27 mmol/L (ref 22–32)
Calcium: 9.5 mg/dL (ref 8.9–10.3)
Chloride: 102 mmol/L (ref 98–111)
Creatinine, Ser: 0.78 mg/dL (ref 0.44–1.00)
GFR, Estimated: >60 mL/min (ref >60)
Glucose, Bld: 101 mg/dL — ABNORMAL HIGH (ref 70–99)
Potassium: 4.3 mmol/L (ref 3.5–5.1)
Sodium: 142 mmol/L (ref 135–145)
Total Bilirubin: 0.6 mg/dL (ref 0.3–1.2)
Total Protein: 7 g/dL (ref 6.5–8.1)

## 2023-05-03 LAB — URINALYSIS, ROUTINE W REFLEX MICROSCOPIC
Bacteria, UA: NONE SEEN
Bilirubin Urine: NEGATIVE
Glucose, UA: NEGATIVE mg/dL
Ketones, ur: NEGATIVE mg/dL
Leukocytes,Ua: NEGATIVE
Nitrite: NEGATIVE
Protein, ur: NEGATIVE mg/dL
RBC / HPF: >50 RBC/hpf (ref 0–5)
Specific Gravity, Urine: 1.02 (ref 1.005–1.030)
pH: 5 (ref 5.0–8.0)

## 2023-05-03 LAB — PROTIME-INR
INR: 0.9 (ref 0.8–1.2)
Prothrombin Time: 12.5 seconds (ref 11.4–15.2)

## 2023-05-03 LAB — HEPATITIS PANEL, ACUTE
HCV Ab: NONREACTIVE
Hep A IgM: NONREACTIVE
Hep B C IgM: NONREACTIVE
Hepatitis B Surface Ag: NONREACTIVE

## 2023-05-03 LAB — PREGNANCY, URINE: Preg Test, Ur: NEGATIVE

## 2023-05-03 LAB — LIPASE, BLOOD: Lipase: 35 U/L (ref 11–51)

## 2023-05-03 MED ORDER — FAMOTIDINE IN NACL 20-0.9 MG/50ML-% IV SOLN
20.0000 mg | Freq: Once | INTRAVENOUS | Status: AC
  Administered 2023-05-03: 20 mg via INTRAVENOUS
  Filled 2023-05-03: qty 50

## 2023-05-03 MED ORDER — IOHEXOL 350 MG/ML SOLN
75.0000 mL | Freq: Once | INTRAVENOUS | Status: AC | PRN
  Administered 2023-05-03: 75 mL via INTRAVENOUS

## 2023-05-03 MED ORDER — ALUM & MAG HYDROXIDE-SIMETH 200-200-20 MG/5ML PO SUSP
30.0000 mL | Freq: Once | ORAL | Status: AC
  Administered 2023-05-03: 30 mL via ORAL
  Filled 2023-05-03: qty 30

## 2023-05-03 NOTE — Discharge Instructions (Signed)
You were evaluated today for abdominal pain.  Your CT examination was reassuring.  You do have elevated liver enzymes which need to be rechecked in 1 to 2 weeks by her primary care provider.  I have also provided contact information for gastroenterology and I recommend a follow-up with their office at the first available appointment.  You may continue to try over-the-counter Pepcid and Maalox as needed as these medications seem to to help with your symptoms today.  If you develop any life-threatening symptoms please return to the emergency department.

## 2023-05-03 NOTE — ED Triage Notes (Signed)
Right abdominal pain x 3 days with nausea, vomiting. Denies hx of GI problems.

## 2023-05-03 NOTE — ED Provider Notes (Signed)
[place] EMERGENCY DEPARTMENT AT [facility] Provider Note   CSN: [id number] Arrival date & time: 05/03/23  0800     History  Chief Complaint  Patient presents with   Abdominal Pain    [name] is a 57 y.o. female. Patient with a history of GERD, spinal cord stimulator, presents to the ED with RLQ abdominal pain for "years". Patient states the pain is intermittent "sharp/cramping" and rates it 5/10. Patient says sometimes her pain radiates to her back. Patient says "greasy foods" make it worse and "nothing" makes it better. Patient says she takes omeprazole daily for GERD. Patient says she has tried stool softeners and that "makes the pain worse". Patient denies use of ibuprofen, alcohol or tobacco use. Patient denies N/V, fever, chills, urinary or bowel changes. Patient denies history of abdominal surgeries and hasn't seen a GI provider for her chronic sxs.   HPI     Home Medications Prior to Admission medications   Medication Sig Start Date End Date Taking? Authorizing Provider  albuterol (VENTOLIN HFA) 108 (90 Base) MCG/ACT inhaler Inhale 2 puffs into the lungs every 6 (six) hours as needed for wheezing or shortness of breath. 02/15/23   Marcine Matar, MD  amLODipine (NORVASC) 10 MG tablet TAKE 1 TABLET(10 MG) BY MOUTH DAILY 11/09/22   Marcine Matar, MD  benzonatate (TESSALON) 100 MG capsule TAKE 1 CAPSULE(100 MG) BY MOUTH TWICE DAILY AS NEEDED FOR COUGH 02/06/23   Marcine Matar, MD  Blood Pressure Monitor DEVI Use as directed to check home blood pressure 2-3 times a week 03/10/19   Marcine Matar, MD  cetirizine (ZYRTEC) 10 MG tablet TAKE 1 TABLET(10 MG) BY MOUTH DAILY 12/31/22   Marcine Matar, MD  cyclobenzaprine (FLEXERIL) 10 MG tablet Take 10 mg by mouth 3 (three) times daily. 09/13/22   [provider]  diclofenac sodium (VOLTAREN) 1 % GEL Apply 2 g topically 4 (four) times daily. 04/01/19   Aviva Kluver B, PA-C  EPINEPHrine (EPIPEN  2-PAK) 0.3 mg/0.3 mL IJ SOAJ injection Inject 0.3 mLs (0.3 mg total) into the muscle as needed for anaphylaxis. 04/18/20   Harlene Salts A, PA-C  fluticasone (FLONASE) 50 MCG/ACT nasal spray Place 1 spray into both nostrils daily. 02/02/21   Marcine Matar, MD  fluticasone (FLONASE) 50 MCG/ACT nasal spray Place 1 spray into both nostrils daily. 06/14/22   Marcine Matar, MD  gabapentin (NEURONTIN) 300 MG capsule Take 1 capsule (300 mg total) by mouth at bedtime. Patient taking differently: Take 300 mg by mouth 2 (two) times daily. 07/05/20   Marcine Matar, MD  hydrOXYzine (VISTARIL) 25 MG capsule TAKE 1 CAPSULE(25 MG) BY MOUTH DAILY AS NEEDED FOR ANXIETY 03/15/23   Marcine Matar, MD  linaclotide [facility]) 290 MCG CAPS capsule TAKE 1 CAPSULE(290 MCG) BY MOUTH DAILY BEFORE BREAKFAST 02/15/23   Marcine Matar, MD  meclizine (ANTIVERT) 25 MG tablet TAKE 1 TABLET(25 MG) BY MOUTH TWICE DAILY AS NEEDED FOR DIZZINESS 07/10/21   Marcine Matar, MD  metFORMIN (GLUCOPHAGE) 500 MG tablet TAKE 1 TABLET(500 MG) BY MOUTH DAILY WITH BREAKFAST 02/15/23   Marcine Matar, MD  montelukast (SINGULAIR) 10 MG tablet Take 1 tablet (10 mg total) by mouth at bedtime. 11/14/21   Olalere, Minna Antis, MD  omeprazole (PRILOSEC) 20 MG capsule TAKE 1 CAPSULE(20 MG) BY MOUTH TWICE DAILY BEFORE A MEAL 01/08/23   Marcine Matar, MD  oxycodone (OXY-IR) 5 MG capsule Take  5 mg by mouth every 8 (eight) hours as needed.    [provider]  promethazine (PHENERGAN) 25 MG tablet TAKE 1 TABLET(25 MG) BY MOUTH EVERY 8 HOURS AS NEEDED FOR NAUSEA OR VOMITING 07/02/22   Marcine Matar, MD  rosuvastatin (CRESTOR) 20 MG tablet TAKE 1 TABLET(20 MG) BY MOUTH DAILY 11/13/22   Marcine Matar, MD      Allergies    Ampicillin, Atorvastatin calcium, Penicillins, Sulfonamide derivatives, Hydrocodone-acetaminophen, Tramadol, and Tylenol [acetaminophen]    Review of Systems   Review of Systems  Physical  Exam Updated Vital Signs BP 114/77   Pulse 81   Temp 98.5 F (36.9 C) (Oral)   Resp 16   Ht 5\' 4"  (1.626 m)   Wt 69.9 kg   SpO2 98%   BMI 26.43 kg/m  Physical Exam Vitals and nursing note reviewed.  Constitutional:      General: She is not in acute distress.    Appearance: She is well-developed.  HENT:     Head: Normocephalic and atraumatic.  Eyes:     Conjunctiva/sclera: Conjunctivae normal.  Cardiovascular:     Rate and Rhythm: Normal rate and regular rhythm.     Heart sounds: No murmur heard. Pulmonary:     Effort: Pulmonary effort is normal. No respiratory distress.     Breath sounds: Normal breath sounds.  Abdominal:     Palpations: Abdomen is soft.     Tenderness: There is abdominal tenderness (Mild tenderness) in the right lower quadrant and epigastric area.  Musculoskeletal:        General: No swelling.     Cervical back: Neck supple.  Skin:    General: Skin is warm and dry.     Capillary Refill: Capillary refill takes less than 2 seconds.  Neurological:     Mental Status: She is alert.  Psychiatric:        Mood and Affect: Mood normal.     ED Results / Procedures / Treatments   Labs (all labs ordered are listed, but only abnormal results are displayed) Labs Reviewed  COMPREHENSIVE METABOLIC PANEL - Abnormal; Notable for the following components:      Result Value   Glucose, Bld 101 (*)    AST 381 (*)    ALT 397 (*)    All other components within normal limits  CBC WITH DIFFERENTIAL/PLATELET - Abnormal; Notable for the following components:   Neutro Abs 1.5 (*)    All other components within normal limits  URINALYSIS, ROUTINE W REFLEX MICROSCOPIC - Abnormal; Notable for the following components:   Hgb urine dipstick MODERATE (*)    All other components within normal limits  LIPASE, BLOOD  PREGNANCY, URINE  PROTIME-INR  HEPATITIS PANEL, ACUTE    EKG None  Radiology CT ABDOMEN PELVIS W CONTRAST  Result Date: 05/03/2023 CLINICAL DATA:  Acute  abdominal pain. EXAM: CT ABDOMEN AND PELVIS WITH CONTRAST TECHNIQUE: Multidetector CT imaging of the abdomen and pelvis was performed using the standard protocol following bolus administration of intravenous contrast. RADIATION DOSE REDUCTION: This exam was performed according to the departmental dose-optimization program which includes automated exposure control, adjustment of the mA and/or kV according to patient size and/or use of iterative reconstruction technique. CONTRAST:  75mL OMNIPAQUE IOHEXOL 350 MG/ML SOLN COMPARISON:  CT examination dated June 29, 2021 FINDINGS: Lower chest: No acute abnormality. Hepatobiliary: No focal liver abnormality is seen. No gallstones, gallbladder wall thickening, or biliary dilatation. Pancreas: Unremarkable. No pancreatic ductal dilatation or surrounding  inflammatory changes. Spleen: Normal in size without focal abnormality. Adrenals/Urinary Tract: Adrenal glands are unremarkable. 0.5 x 1.2 cm calculus in the left interpolar region. No evidence of hydronephrosis or ureteral calculus. Right kidney and urinary bladder are unremarkable. Stomach/Bowel: Stomach is within normal limits. Appendix appears normal. No evidence of bowel wall thickening, distention, or inflammatory changes. Vascular/Lymphatic: No significant vascular findings are present. No enlarged abdominal or pelvic lymph nodes. Reproductive: Uterus and bilateral adnexa are unremarkable. Other: No abdominal wall hernia or abnormality. No abdominopelvic ascites. Musculoskeletal: Spinal stimulator with leads terminating in the lower thoracic spinal canal and battery device in the right subcutaneous soft tissues of the right lumbar region. No acute osseous abnormality. IMPRESSION: 1. 0.5 x 1.2 cm calculus in the left interpolar region without evidence of hydronephrosis or ureteral calculus. 2. Bowel loops are normal in caliber. Appendix is normal. No evidence of colitis or diverticulitis. 3. Uterus and adnexa are  unremarkable. 4. Spinal stimulator with leads terminating in the lower thoracic spinal canal and battery device in the right subcutaneous soft tissues of the right lumbar region. Electronically Signed   By: Larose Hires D.O.   On: 05/03/2023 10:33    Procedures Procedures    Medications Ordered in ED Medications  famotidine (PEPCID) IVPB 20 mg premix (20 mg Intravenous New Bag/Given 05/03/23 0956)  alum & mag hydroxide-simeth (MAALOX/MYLANTA) 200-200-20 MG/5ML suspension 30 mL (30 mLs Oral Given 05/03/23 0956)  iohexol (OMNIPAQUE) 350 MG/ML injection 75 mL (75 mLs Intravenous Contrast Given 05/03/23 1019)    ED Course/ Medical Decision Making/ A&P                             Medical Decision Making Amount and/or Complexity of Data Reviewed Labs: ordered. Radiology: ordered.  Risk OTC drugs. Prescription drug management.   This patient presents to the ED for concern of abdominal pain, this involves an extensive number of treatment options, and is a complaint that carries with it a high risk of complications and morbidity.  The differential diagnosis includes appendicitis, cholecystitis, colitis, others   Co morbidities that complicate the patient evaluation  Type II DM   Lab Tests:  I Ordered, and personally interpreted labs.  The pertinent results include: AST 381, ALT 397, labs otherwise grossly unremarkable   Imaging Studies ordered:  I ordered imaging studies including CT abdomen pelvis with contrast I independently visualized and interpreted imaging which showed  1. 0.5 x 1.2 cm calculus in the left interpolar region without  evidence of hydronephrosis or ureteral calculus.  2. Bowel loops are normal in caliber. Appendix is normal. No  evidence of colitis or diverticulitis.  3. Uterus and adnexa are unremarkable.  4. Spinal stimulator with leads terminating in the lower thoracic  spinal canal and battery device in the right subcutaneous soft  tissues of the right  lumbar region.   I agree with the radiologist interpretation   Problem List / ED Course / Critical interventions / Medication management   I ordered medication including Pepcid and Maalox for reflux-like symptoms Reevaluation of the patient after these medicines showed that the patient improved I have reviewed the patients home medicines and have made adjustments as needed  Test / Admission - Considered:  Patient's symptoms completely subsided with medication administration.  CT examination with incidental findings but no abnormality to explain patient's pain.  Upon reassessment there is no tenderness in either the right lower quadrant, epigastric region or right  upper quadrant.  Patient does have elevated liver enzymes of unknown etiology at this time.  Plan to have patient follow-up with gastroenterology and also plan to have patient follow-up with her primary care provider.  Patient was on phone with primary care provider at my last assessment working on scheduling a follow-up appointment.  Patient will need recheck of liver enzymes.  No indication at this time for admission.  Discharge home         Final Clinical Impression(s) / ED Diagnoses Final diagnoses:  Abdominal pain, unspecified abdominal location  Elevated liver enzymes    Rx / DC Orders ED Discharge Orders     None         Pamala Duffel 05/03/23 1157    Terald Sleeper, MD 05/03/23 [phone]

## 2023-05-13 ENCOUNTER — Other Ambulatory Visit: Payer: Self-pay | Admitting: Internal Medicine

## 2023-05-13 DIAGNOSIS — E782 Mixed hyperlipidemia: Secondary | ICD-10-CM

## 2023-05-22 ENCOUNTER — Other Ambulatory Visit: Payer: Self-pay | Admitting: Pharmacist

## 2023-05-22 NOTE — Progress Notes (Signed)
Pharmacy Quality Measure Review  This patient is appearing on a report for being at risk of failing the adherence measure for cholesterol (statin) medications this calendar year.   Medication: rosuvastatin 20 mg daily Last fill date: 05/13/2023 for 90 day supply  Of note, pt was seen in the ED with abdominal pain. LFTs were elevated, however, CT A/P unremarkable. She has planned follow-up w/ GI in September and has an appt scheduled with [place] 05/23/2023. She had a gap in statin adherence earlier this year but now seems to be filling consistently. She filled rosuvastatin in June for a 30-day and now this month for a 90-day. No further follow-up needed at this time.   Butch Penny, PharmD, P[place]y [place], CPP Clinical Pharmacist [facility] [phone]

## 2023-05-23 ENCOUNTER — Encounter: Payer: Self-pay | Admitting: Physician Assistant

## 2023-05-23 ENCOUNTER — Other Ambulatory Visit (HOSPITAL_COMMUNITY)
Admission: RE | Admit: 2023-05-23 | Discharge: 2023-05-23 | Disposition: A | Discharge status: Home / Self Care | Payer: 59 | Source: Ambulatory Visit | Attending: Physician Assistant | Admitting: Physician Assistant

## 2023-05-23 ENCOUNTER — Ambulatory Visit: Payer: 59 | Attending: Physician Assistant | Admitting: Physician Assistant

## 2023-05-23 VITALS — BP 117/78 | HR 105 | Wt 145.0 lb

## 2023-05-23 DIAGNOSIS — R8290 Unspecified abnormal findings in urine: Secondary | ICD-10-CM

## 2023-05-23 DIAGNOSIS — R0982 Postnasal drip: Secondary | ICD-10-CM

## 2023-05-23 DIAGNOSIS — R1031 Right lower quadrant pain: Secondary | ICD-10-CM

## 2023-05-23 DIAGNOSIS — I10 Essential (primary) hypertension: Secondary | ICD-10-CM | POA: Diagnosis not present

## 2023-05-23 DIAGNOSIS — Z09 Encounter for follow-up examination after completed treatment for conditions other than malignant neoplasm: Secondary | ICD-10-CM

## 2023-05-23 DIAGNOSIS — K4090 Unilateral inguinal hernia, without obstruction or gangrene, not specified as recurrent: Secondary | ICD-10-CM | POA: Diagnosis not present

## 2023-05-23 LAB — POCT URINALYSIS DIP (CLINITEK)
Bilirubin, UA: NEGATIVE
Glucose, UA: NEGATIVE mg/dL
Ketones, POC UA: NEGATIVE mg/dL
Nitrite, UA: NEGATIVE
POC PROTEIN,UA: NEGATIVE
Spec Grav, UA: 1.01 (ref 1.010–1.025)
Urobilinogen, UA: 0.2 E.U./dL
pH, UA: 6 (ref 5.0–8.0)

## 2023-05-23 MED ORDER — IBUPROFEN 600 MG PO TABS
600.0000 mg | ORAL_TABLET | Freq: Three times a day (TID) | ORAL | 0 refills | Status: DC | PRN
Start: 2023-05-23 — End: 2023-08-23

## 2023-05-23 MED ORDER — BENZONATATE 100 MG PO CAPS
ORAL_CAPSULE | ORAL | 0 refills | Status: DC
Start: 2023-05-23 — End: 2023-07-11

## 2023-05-23 MED ORDER — FLUTICASONE PROPIONATE 50 MCG/ACT NA SUSP
1.0000 | Freq: Every day | NASAL | 2 refills | Status: DC
Start: 2023-05-23 — End: 2023-06-11

## 2023-05-23 MED ORDER — AMLODIPINE BESYLATE 10 MG PO TABS
10.0000 mg | ORAL_TABLET | Freq: Every day | ORAL | 3 refills | Status: DC
Start: 2023-05-23 — End: 2023-08-23

## 2023-05-23 NOTE — Patient Instructions (Addendum)
Gastroesophageal Reflux Disease, Adult  Gastroesophageal reflux (GER) happens when acid from the stomach flows up into the tube that connects the mouth and the stomach (esophagus). Normally, food travels down the esophagus and stays in the stomach to be digested. With GER, food and stomach acid sometimes move back up into the esophagus. You may have a disease called gastroesophageal reflux disease (GERD) if the reflux: Happens often. Causes frequent or very bad symptoms. Causes problems such as damage to the esophagus. When this happens, the esophagus becomes sore and swollen. Over time, GERD can make small holes (ulcers) in the lining of the esophagus. What are the causes? This condition is caused by a problem with the muscle between the esophagus and the stomach. When this muscle is weak or not normal, it does not close properly to keep food and acid from coming back up from the stomach. The muscle can be weak because of: Tobacco use. Pregnancy. Having a certain type of hernia (hiatal hernia). Alcohol use. Certain foods and drinks, such as coffee, chocolate, onions, and peppermint. What increases the risk? Being overweight. Having a disease that affects your connective tissue. Taking NSAIDs, such a ibuprofen. What are the signs or symptoms? Heartburn. Difficult or painful swallowing. The feeling of having a lump in the throat. A bitter taste in the mouth. Bad breath. Having a lot of saliva. Having an upset or bloated stomach. Burping. Chest pain. Different conditions can cause chest pain. Make sure you see your doctor if you have chest pain. Shortness of breath or wheezing. A long-term cough or a cough at night. Wearing away of the surface of teeth (tooth enamel). Weight loss. How is this treated? Making changes to your diet. Taking medicine. Having surgery. Treatment will depend on how bad your symptoms are. Follow these instructions at home: Eating and  drinking  Follow a diet as told by your doctor. You may need to avoid foods and drinks such as: Coffee and tea, with or without caffeine. Drinks that contain alcohol. Energy drinks and sports drinks. Bubbly (carbonated) drinks or sodas. Chocolate and cocoa. Peppermint and mint flavorings. Garlic and onions. Horseradish. Spicy and acidic foods. These include peppers, chili powder, curry powder, vinegar, hot sauces, and BBQ sauce. Citrus fruit juices and citrus fruits, such as oranges, lemons, and limes. Tomato-based foods. These include red sauce, chili, salsa, and pizza with red sauce. Fried and fatty foods. These include donuts, french fries, potato chips, and high-fat dressings. High-fat meats. These include hot dogs, rib eye steak, sausage, ham, and bacon. High-fat dairy items, such as whole milk, butter, and cream cheese. Eat small meals often. Avoid eating large meals. Avoid drinking large amounts of liquid with your meals. Avoid eating meals during the 2-3 hours before bedtime. Avoid lying down right after you eat. Do not exercise right after you eat. Lifestyle  Do not smoke or use any products that contain nicotine or tobacco. If you need help quitting, ask your doctor. Try to lower your stress. If you need help doing this, ask your doctor. If you are overweight, lose an amount of weight that is healthy for you. Ask your doctor about a safe weight loss goal. General instructions Pay attention to any changes in your symptoms. Take over-the-counter and prescription medicines only as told by your doctor. Do not take aspirin, ibuprofen, or other NSAIDs unless your doctor says it is okay. Wear loose clothes. Do not wear anything tight around your waist. Raise (elevate) the head of your  bed about 6 inches (15 cm). You may need to use a wedge to do this. Avoid bending over if this makes your symptoms worse. Keep all follow-up visits. Contact a doctor if: You have new  symptoms. You lose weight and you do not know why. You have trouble swallowing or it hurts to swallow. You have wheezing or a cough that keeps happening. You have a hoarse voice. Your symptoms do not get better with treatment. Get help right away if: You have sudden pain in your arms, neck, jaw, teeth, or back. You suddenly feel sweaty, dizzy, or light-headed. You have chest pain or shortness of breath. You vomit and the vomit is green, yellow, or black, or it looks like blood or coffee grounds. You faint. Your poop (stool) is red, bloody, or black. You cannot swallow, drink, or eat. These symptoms may represent a serious problem that is an emergency. Do not wait to see if the symptoms will go away. Get medical help right away. Call your local emergency services (911 in the [place] Do not drive yourself to the hospital. Summary If a person has gastroesophageal reflux disease (GERD), food and stomach acid move back up into the esophagus and cause symptoms or problems such as damage to the esophagus. Treatment will depend on how bad your symptoms are. Follow a diet as told by your doctor. Take all medicines only as told by your doctor. This information is not intended to replace advice given to you by your health care provider. Make sure you discuss any questions you have with your health care provider. Document Revised: 05/02/2020 Document Reviewed: 05/02/2020 Elsevier Patient Education  2024 [employer] Hernia, Adult     A hernia is the bulging of an organ or tissue through a weak spot in the muscles of the abdomen. Hernias develop most often near the belly button (navel) or the area where the leg meets the lower abdomen (groin). Common types of hernias include: Incisional hernia. This type bulges through a scar from an abdominal surgery. Umbilical hernia. This type develops near the navel. Inguinal hernia. This type develops in the groin or scrotum. Femoral hernia. This type  develops below the groin, in the upper thigh area. Hiatal hernia. This type occurs when part of the stomach slides above the muscle that separates the abdomen from the chest (diaphragm). What are the causes? This condition may be caused by: Heavy lifting. Coughing over a long period of time. Straining to have a bowel movement. Constipation can lead to straining. An incision made during abdominal surgery. A physical problem that is present at birth (congenital defect). Being overweight or obese. Smoking. Excess fluid in the abdomen. Undescended testicles in males. What are the signs or symptoms? The main symptom is a skin-colored, rounded bulge in the area of the hernia. However, a bulge may not always be present. It may grow bigger or be more visible when you cough or strain (such as when lifting something heavy). A hernia that can be pushed back into the abdomen (is reducible) rarely causes pain. A hernia that cannot be pushed back into the abdomen (is incarcerated) may lose its blood supply (become strangulated). A hernia that is incarcerated may cause: Pain. Fever. Nausea and vomiting. Swelling. Constipation. How is this diagnosed? A hernia may be diagnosed based on: Your symptoms and medical history. A physical exam. Your health care provider may ask you to cough or move in certain ways to see if the hernia becomes visible. Imaging tests, such  as: X-rays. Ultrasound. CT scan. How is this treated? A hernia that is small and painless may not need to be treated. A hernia that is large or painful may be treated with surgery. Inguinal hernias may be treated with surgery to prevent incarceration or strangulation. Strangulated hernias are always treated with surgery because the strangulation causes a lack of blood supply to the trapped organ or tissue. Surgery to treat a hernia involves pushing the bulge back into place and repairing the weak area of the muscle or abdominal wall. Follow  these instructions at home: Activity Avoid straining. Do not lift anything that is heavier than 10 lb (4.5 kg), or the limit that you are told, until your health care provider says that it is safe. When lifting heavy objects, lift with your leg muscles, not your back muscles. Preventing constipation Take actions to prevent constipation. Constipation leads to straining with bowel movements, which can make a hernia worse or cause a hernia repair to break down. Your health care provider may recommend that you take these actions to prevent or treat constipation: Drink enough fluid to keep your urine pale yellow. Take over-the-counter or prescription medicines. Eat foods that are high in fiber, such as beans, whole grains, and fresh fruits and vegetables. Limit foods that are high in fat and processed sugars, such as fried or sweet foods. General instructions When coughing, try to cough gently. You may try to push the hernia back in place by very gently pressing on it while lying down. Do not try to force the bulge back in if it will not push in easily. If you are overweight, work with your health care provider to lose weight safely. Do not use any products that contain nicotine or tobacco. These products include cigarettes, chewing tobacco, and vaping devices, such as e-cigarettes. If you need help quitting, ask your health care provider. If you are scheduled for hernia repair, watch your hernia for any changes in shape, size, or color. Tell your health care provider about any changes or new symptoms. Take over-the-counter and prescription medicines only as told by your health care provider. Keep all follow-up visits. This is important. Contact a health care provider if: You develop new pain, swelling, or redness around your hernia. You have signs of constipation, such as: Fewer bowel movements in a week than normal. Difficulty having a bowel movement. Stools that are dry, hard, or larger than  normal. Get help right away if: You have a fever or chills. You have abdominal pain that gets worse. You feel nauseous or you vomit. You cannot push the hernia back in place by very gently pressing on it while lying down. Do not try to force the bulge back in if it will not go in easily. The hernia: Changes in shape, size, or color. Feels hard or tender. These symptoms may represent a serious problem that is an emergency. Do not wait to see if the symptoms will go away. Get medical help right away. Call your local emergency services (911 in the [place] Do not drive yourself to the hospital. Summary A hernia is the bulging of an organ or tissue through a weak spot in the muscles of the abdomen. The main symptom is a skin-colored bulge in the hernia area. However, a bulge may not always be present. It may grow bigger or more visible when you cough or strain (such as when having a bowel movement). A hernia that is small and painless may not need to be  treated. A hernia that is large or painful may be treated with surgery. Surgery to treat a hernia involves pushing the bulge back into place and repairing the weak part of the abdomen. This information is not intended to replace advice given to you by your health care provider. Make sure you discuss any questions you have with your health care provider. Document Revised: 05/27/2020 Document Reviewed: 05/30/2020 Elsevier Patient Education  2024 [employer].

## 2023-05-23 NOTE — Progress Notes (Signed)
Patient ID: [name], female   DOB: 05-31-1966, 57 y.o.   MRN: [id number]    Ernestine Rohman, is a 57 y.o. female  EXB:[id number]  NUU:[id number]  DOB - 09-19-1966  Chief Complaint  Patient presents with   Abdominal Pain   Hospitalization Follow-up       Subjective:   [name] is a 57 y.o. female here today for a follow up visit after being seen for severe RLQ pain in the ED 05/03/2023.  She has been having this pain for many years but on that particular day the pain was severe.  She also suffers from acid reflux and has GI appt in September.  CT did not show any acute findings(appendix and adnexa/uterus visualized).  Pain has been there for many years and aches almost all the time.  Worse with cough or straining.    No pain with urination but she has noticed a fishy odor.  Not sexually active.     From ED note: AIZAH GEHLHAUSEN is a 57 y.o. female. Patient with a history of GERD, spinal cord stimulator, presents to the ED with RLQ abdominal pain for "years". Patient states the pain is intermittent "sharp/cramping" and rates it 5/10. Patient says sometimes her pain radiates to her back. Patient says "greasy foods" make it worse and "nothing" makes it better. Patient says she takes omeprazole daily for GERD. Patient says she has tried stool softeners and that "makes the pain worse". Patient denies use of ibuprofen, alcohol or tobacco use. Patient denies N/V, fever, chills, urinary or bowel changes. Patient denies history of abdominal surgeries and hasn't seen a GI provider for her chronic sxs.   Medical Decision Making Amount and/or Complexity of Data Reviewed Labs: ordered. Radiology: ordered.   Risk OTC drugs. Prescription drug management.     This patient presents to the ED for concern of abdominal pain, this involves an extensive number of treatment options, and is a complaint that carries with it a high risk of complications and morbidity.  The differential diagnosis includes  appendicitis, cholecystitis, colitis, others     Co morbidities that complicate the patient evaluation   Type II DM     Lab Tests:   I Ordered, and personally interpreted labs.  The pertinent results include: AST 381, ALT 397, labs otherwise grossly unremarkable     Imaging Studies ordered:   I ordered imaging studies including CT abdomen pelvis with contrast I independently visualized and interpreted imaging which showed  1. 0.5 x 1.2 cm calculus in the left interpolar region without  evidence of hydronephrosis or ureteral calculus.  2. Bowel loops are normal in caliber. Appendix is normal. No  evidence of colitis or diverticulitis.  3. Uterus and adnexa are unremarkable.  4. Spinal stimulator with leads terminating in the lower thoracic  spinal canal and battery device in the right subcutaneous soft  tissues of the right lumbar region.    I agree with the radiologist interpretation     Problem List / ED Course / Critical interventions / Medication management     I ordered medication including Pepcid and Maalox for reflux-like symptoms Reevaluation of the patient after these medicines showed that the patient improved I have reviewed the patients home medicines and have made adjustments as needed   Test / Admission - Considered:   Patient's symptoms completely subsided with medication administration.  CT examination with incidental findings but no abnormality to explain patient's pain.  Upon reassessment there is no tenderness in  either the right lower quadrant, epigastric region or right upper quadrant.  Patient does have elevated liver enzymes of unknown etiology at this time.  Plan to have patient follow-up with gastroenterology and also plan to have patient follow-up with her primary care provider.  Patient was on phone with primary care provider at my last assessment working on scheduling a follow-up appointment.  Patient will need recheck of liver enzymes.  No indication  at this time for admission.  Discharge home No problems updated.  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 Claritin   Vertigo     MEDICATIONS AT HOME: Prior to Admission medications   Medication Sig Start Date End Date Taking? Authorizing Provider  albuterol (VENTOLIN HFA) 108 (90 Base) MCG/ACT inhaler Inhale 2 puffs into the lungs every 6 (six) hours as needed for wheezing or shortness of breath. 02/15/23  Yes Marcine Matar, MD  Blood Pressure Monitor DEVI Use as directed to check home blood pressure 2-3 times a week 03/10/19  Yes Marcine Matar, MD  cetirizine (ZYRTEC) 10 MG tablet TAKE 1 TABLET(10 MG) BY MOUTH DAILY 12/31/22  Yes Marcine Matar, MD  cyclobenzaprine (FLEXERIL) 10 MG tablet Take 10 mg by mouth 3 (three) times daily. 09/13/22  Yes [provider]  diclofenac sodium (VOLTAREN) 1 % GEL Apply 2 g topically 4 (four) times daily. 04/01/19  Yes [place] Scrape, Alyssa B, PA-C  EPINEPHrine (EPIPEN 2-PAK) 0.3 mg/0.3 mL IJ SOAJ injection Inject 0.3 mLs (0.3 mg total) into the muscle as needed for anaphylaxis. 04/18/20  Yes Harlene Salts A, PA-C  fluticasone (FLONASE) 50 MCG/ACT nasal spray Place 1 spray into both nostrils daily. 06/14/22  Yes Marcine Matar, MD  gabapentin (NEURONTIN) 300 MG capsule Take 1 capsule (300 mg total) by mouth at bedtime. Patient taking differently: Take 300 mg by mouth 2 (two) times daily. 07/05/20  Yes  Marcine Matar, MD  hydrOXYzine (VISTARIL) 25 MG capsule TAKE 1 CAPSULE(25 MG) BY MOUTH DAILY AS NEEDED FOR ANXIETY 03/15/23  Yes Marcine Matar, MD  ibuprofen (ADVIL) 600 MG tablet Take 1 tablet (600 mg total) by mouth every 8 (eight) hours as needed. 05/23/23  Yes Braidyn Peace, Marzella Schlein, PA-C  linaclotide (LINZESS) 290 MCG CAPS capsule TAKE 1 CAPSULE(290 MCG) BY MOUTH DAILY BEFORE BREAKFAST 02/15/23  Yes Marcine Matar, MD  meclizine (ANTIVERT) 25 MG tablet TAKE 1 TABLET(25 MG) BY MOUTH TWICE DAILY AS NEEDED FOR DIZZINESS 07/10/21  Yes Marcine Matar, MD  metFORMIN (GLUCOPHAGE) 500 MG tablet TAKE 1 TABLET(500 MG) BY MOUTH DAILY WITH BREAKFAST 02/15/23  Yes Marcine Matar, MD  montelukast (SINGULAIR) 10 MG tablet Take 1 tablet (10 mg total) by mouth at bedtime. 11/14/21  Yes Olalere, Adewale A, MD  omeprazole (PRILOSEC) 20 MG capsule TAKE 1 CAPSULE(20 MG) BY MOUTH TWICE DAILY BEFORE A MEAL 01/08/23  Yes Marcine Matar, MD  oxycodone (  OXY-IR) 5 MG capsule Take 5 mg by mouth every 8 (eight) hours as needed.   Yes [provider]  promethazine (PHENERGAN) 25 MG tablet TAKE 1 TABLET(25 MG) BY MOUTH EVERY 8 HOURS AS NEEDED FOR NAUSEA OR VOMITING 07/02/22  Yes Marcine Matar, MD  rosuvastatin (CRESTOR) 20 MG tablet TAKE 1 TABLET(20 MG) BY MOUTH DAILY 05/13/23  Yes Marcine Matar, MD  amLODipine (NORVASC) 10 MG tablet Take 1 tablet (10 mg total) by mouth daily. 05/23/23   Anders Simmonds, PA-C  benzonatate (TESSALON) 100 MG capsule TAKE 1 CAPSULE(100 MG) BY MOUTH TWICE DAILY AS NEEDED FOR COUGH 05/23/23   Verena Shawgo, Marylene Land M, PA-C  fluticasone (FLONASE) 50 MCG/ACT nasal spray Place 1 spray into both nostrils daily. 05/23/23   Tracina Beaumont, Marzella Schlein, PA-C    ROS: Neg HEENT Neg resp Neg cardiac Neg MS Neg psych Neg neuro  Objective:   Vitals:   05/23/23 1414  BP: 117/78  Pulse: (!) 105  SpO2: 98%  Weight: 145 lb (65.8 kg)   Exam General appearance : Awake, alert, not in any  distress. Speech Clear. Not toxic looking HEENT: Atraumatic and Normocephalic Neck: Supple, no JVD. No cervical lymphadenopathy.  Chest: Good air entry bilaterally, CTAB.  No rales/rhonchi/wheezing CVS: S1 S2 regular, no murmurs.  Abdomen: Bowel sounds present, Non tender and not distended with no gaurding, rigidity or rebound.  Slight R inguinal fullness and it feels as though a small inguinal hernia bulges with cough/valsalva.  Reducible.   Extremities: B/L Lower Ext shows no edema, both legs are warm to touch Neurology: Awake alert, and oriented X 3, CN II-XII intact, Non focal Skin: No Rash  Data Review Lab Results  Component Value Date   HGBA1C 5.8 02/15/2023   HGBA1C 5.8 10/15/2022   HGBA1C 6.2 06/14/2022    Assessment & Plan   1. Right lower quadrant abdominal pain Reviewed imaging and blood work.   - POCT URINALYSIS DIP (CLINITEK) - Ambulatory referral to General Surgery  2. Essential hypertension controlled - amLODipine (NORVASC) 10 MG tablet; Take 1 tablet (10 mg total) by mouth daily.  Dispense: 90 tablet; Refill: 3  3. Post-nasal drip - fluticasone (FLONASE) 50 MCG/ACT nasal spray; Place 1 spray into both nostrils daily.  Dispense: 16 g; Refill: 2 - benzonatate (TESSALON) 100 MG capsule; TAKE 1 CAPSULE(100 MG) BY MOUTH TWICE DAILY AS NEEDED FOR COUGH  Dispense: 30 capsule; Refill: 0  4. Inguinal hernia of right side without obstruction or gangrene reducible - Ambulatory referral to General Surgery - ibuprofen (ADVIL) 600 MG tablet; Take 1 tablet (600 mg total) by mouth every 8 (eight) hours as needed.  Dispense: 30 tablet; Refill: 0  5. Abnormal urine odor - Cervicovaginal ancillary only Increase water intake  6. Hospital discharge follow-up    Return in about 3 months (around 08/23/2023) for PCP?johnson for chronic conditions.  The patient was given clear instructions to go to ER or return to medical center if symptoms don't improve, worsen or new problems  develop. The patient verbalized understanding. The patient was told to call to get lab results if they haven't heard anything in the next week.      Georgian Co, PA-C [facility] [place], [place] [phone]   05/23/2023, 2:35 PM

## 2023-05-24 LAB — CERVICOVAGINAL ANCILLARY ONLY
Bacterial Vaginitis (gardnerella): NEGATIVE
Candida Glabrata: NEGATIVE
Candida Vaginitis: NEGATIVE
Chlamydia: NEGATIVE
Comment: NEGATIVE
Comment: NEGATIVE
Comment: NEGATIVE
Comment: NEGATIVE
Comment: NEGATIVE
Comment: NORMAL
Neisseria Gonorrhea: NEGATIVE
Trichomonas: NEGATIVE

## 2023-05-27 ENCOUNTER — Other Ambulatory Visit: Payer: Self-pay | Admitting: Internal Medicine

## 2023-05-27 DIAGNOSIS — E1169 Type 2 diabetes mellitus with other specified complication: Secondary | ICD-10-CM

## 2023-05-27 DIAGNOSIS — F411 Generalized anxiety disorder: Secondary | ICD-10-CM

## 2023-05-29 ENCOUNTER — Telehealth: Payer: Self-pay

## 2023-05-29 NOTE — Telephone Encounter (Signed)
Pt was called and vm was left, Information has been sent to nurse pool.   

## 2023-05-29 NOTE — Telephone Encounter (Signed)
-----   Message from Georgian Co sent at 05/27/2023  9:53 AM EDT ----- Your vaginal swabs were normal.  Thanks, Georgian Co, PA-C

## 2023-05-31 ENCOUNTER — Other Ambulatory Visit: Payer: Self-pay | Admitting: Internal Medicine

## 2023-05-31 DIAGNOSIS — J3089 Other allergic rhinitis: Secondary | ICD-10-CM

## 2023-05-31 NOTE — Telephone Encounter (Signed)
Requested Prescriptions  Pending Prescriptions Disp Refills   cetirizine (ZYRTEC) 10 MG tablet [Pharmacy Med Name: CETIRIZINE 10MG  TABLETS] 90 tablet 1    Sig: TAKE 1 TABLET(10 MG) BY MOUTH DAILY     Ear, Nose, and Throat:  Antihistamines 2 Passed - 05/31/2023  4:36 AM      Passed - Cr in normal range and within 360 days    Creatinine, Ser  Date Value Ref Range Status  05/03/2023 0.78 0.44 - 1.00 mg/dL Final         Passed - Valid encounter within last 12 months    Recent Outpatient Visits           1 week ago Right lower quadrant abdominal pain   [place] [facility] [place], [place], [place]   2 months ago Dysuria   [place] [facility] Jonah Blue B, MD   3 months ago Type 2 diabetes mellitus with hyperlipidemia [facility])   [place] [facility] Jonah Blue B, MD   7 months ago New onset type 2 diabetes mellitus [facility])   [place] [facility] Marcine Matar, MD   10 months ago Non-seasonal allergic rhinitis, unspecified trigger   [place] [facility] Hoy Register, MD       Future Appointments             In 2 months Laural Benes, Binnie Rail, MD [facility]

## 2023-06-11 ENCOUNTER — Encounter: Payer: Self-pay | Admitting: Internal Medicine

## 2023-06-11 ENCOUNTER — Ambulatory Visit (INDEPENDENT_AMBULATORY_CARE_PROVIDER_SITE_OTHER): Payer: 59 | Admitting: Internal Medicine

## 2023-06-11 ENCOUNTER — Other Ambulatory Visit (INDEPENDENT_AMBULATORY_CARE_PROVIDER_SITE_OTHER): Payer: 59

## 2023-06-11 VITALS — BP 96/58 | HR 71 | Ht 64.0 in | Wt 145.0 lb

## 2023-06-11 DIAGNOSIS — K5900 Constipation, unspecified: Secondary | ICD-10-CM | POA: Diagnosis not present

## 2023-06-11 DIAGNOSIS — K8020 Calculus of gallbladder without cholecystitis without obstruction: Secondary | ICD-10-CM

## 2023-06-11 DIAGNOSIS — K219 Gastro-esophageal reflux disease without esophagitis: Secondary | ICD-10-CM

## 2023-06-11 DIAGNOSIS — R7989 Other specified abnormal findings of blood chemistry: Secondary | ICD-10-CM | POA: Diagnosis not present

## 2023-06-11 DIAGNOSIS — R1031 Right lower quadrant pain: Secondary | ICD-10-CM

## 2023-06-11 LAB — HEPATIC FUNCTION PANEL
ALT: 55 U/L — ABNORMAL HIGH (ref 0–35)
AST: 40 U/L — ABNORMAL HIGH (ref 0–37)
Albumin: 4.6 g/dL (ref 3.5–5.2)
Alkaline Phosphatase: 59 U/L (ref 39–117)
Bilirubin, Direct: 0 mg/dL (ref 0.0–0.3)
Total Bilirubin: 0.4 mg/dL (ref 0.2–1.2)
Total Protein: 7.6 g/dL (ref 6.0–8.3)

## 2023-06-11 NOTE — Patient Instructions (Signed)
You have been scheduled for a colonoscopy. Please follow written instructions given to you at your visit today.   Please pick up your prep supplies at the pharmacy within the next 1-3 days.  If you use inhalers (even only as needed), please bring them with you on the day of your procedure.  DO NOT TAKE 7 DAYS PRIOR TO TEST- Trulicity (dulaglutide) Ozempic, Wegovy (semaglutide) Mounjaro (tirzepatide) Bydureon Bcise (exanatide extended release)  DO NOT TAKE 1 DAY PRIOR TO YOUR TEST Rybelsus (semaglutide) Adlyxin (lixisenatide) Victoza (liraglutide) Byetta (exanatide) ___________________________________________________________________________  _______________________________________________________  If your blood pressure at your visit was 140/90 or greater, please contact your primary care physician to follow up on this.  _______________________________________________________  If you are age 21 or older, your body mass index should be between 23-30. Your Body mass index is 24.89 kg/m. If this is out of the aforementioned range listed, please consider follow up with your Primary Care Provider.  If you are age 52 or younger, your body mass index should be between 19-25. Your Body mass index is 24.89 kg/m. If this is out of the aformentioned range listed, please consider follow up with your Primary Care Provider.   ________________________________________________________  The [facility] providers would like to encourage you to use [facility] to communicate with providers for non-urgent requests or questions.  Due to long hold times on the telephone, sending your provider a message by [facility] may be a faster and more efficient way to get a response.  Please allow 48 business hours for a response.  Please remember that this is for non-urgent requests.  _______________________________________________________

## 2023-06-11 NOTE — Progress Notes (Signed)
HISTORY OF PRESENT ILLNESS:  [name] is a 57 y.o. female with past medical history as listed below who has been followed in this office for GERD, chronic constipation, and chronic right lower quadrant pain.  She also has cholelithiasis, which is felt to be incidental.  She was last evaluated in this office August 22, 2021 regarding functional constipation, right lower quadrant pain, and GERD.  See that dictation.  Tells me that her right lower quadrant pain is worsened.  She tells me that her PCP thought she might have an inguinal or femoral hernia for which she is being referred to general surgery for an opinion.  She was recently seen in the emergency room for worsening right lower quadrant pain on 01/31/2023.  She underwent contrast-enhanced CT scan of the abdomen and pelvis which revealed no acute abnormalities.  See that report.  Blood work was unremarkable except for markedly elevated liver tests with AST 381 and ALT 397.  Normal prothrombin time.  Unremarkable CBC.  Acute hepatitis serologies were negative.  She does feel that her pain sometimes is worse with bowel movements.  For her GERD she continues on omeprazole 20 mg twice daily.  This is effective.  No dysphagia.  For constipation she continues on Linzess 290 mcg daily.  The most part, this helps.  She does continue to get narcotics intermittently for chronic pain.  She does have a spinal stimulator for her chronic low back pain.  As previously mentioned, she has noted to have cholelithiasis.  Recent CT scan no evidence for biliary ductal dilation or cholecystitis.  Last colonoscopy upper endoscopy were performed in 2016.  Colonoscopy was normal.  Upper endoscopy revealed a distal esophageal ring.  Otherwise normal.  REVIEW OF SYSTEMS:  All non-GI ROS negative unless otherwise stated in the HPI except for sinus allergy, anxiety, back pain, cough, hearing problems, night sweats, swelling of the feet, shortness of breath.  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 Claritin   Vertigo     Past Surgical History:  Procedure Laterality Date   ANAL FISTULECTOMY  08/22/2012   Procedure: FISTULECTOMY ANAL;  Surgeon: Lodema Pilot, DO;  Location: [facility];  Service: General;  Laterality: N/A;  rectal examination under anesthesia with seton placement    ANAL FISTULECTOMY  09/17/2012   Procedure: FISTULECTOMY ANAL;  Surgeon: Lodema Pilot, DO;  Location: [facility];  Service: General;  Laterality: N/A;  possible fistulotomy   BACK SURGERY  10/19/2022   D & C HYSTEROSCOPY/ NOVASURE ENDOMETRIAL ABLATION/ I & D THROMBOSED HEMORROID  01-10-2007  DR Isaias Cowman ROSS   EXAMINATION UNDER ANESTHESIA  09/17/2012   Procedure: EXAM UNDER ANESTHESIA;  Surgeon: Lodema Pilot, DO;  Location: [facility];  Service: General;  Laterality: N/A;  Rectal exam under anesthesia   EXAMINATION UNDER ANESTHESIA N/A 05/14/2013   Procedure: EXAM UNDER ANESTHESIA;  Surgeon: Romie Levee, MD;  Location[facility];  Service: General;  Laterality: N/A;   FISTULA PLUG  09/17/2012   Procedure: FISTULA PLUG;  Surgeon: Lodema Pilot, DO;  Location: [facility];  Service: General;  Laterality: N/A;  Possible fistula plug   HEMORRHOID SURGERY  04/28/2009   PPH INTERNAL HEMORRHOIDS   IRRIGATION AND DEBRIDEMENT ABSCESS  07/08/2012   Procedure: MINOR INCISION AND DRAINAGE OF ABSCESS;  Surgeon: Geryl Rankins, MD;  Location: [facility] ;  Service: Gynecology;  Laterality: N/A;  Vulvar Abscess   PLACEMENT OF SETON N/A 05/14/2013   Procedure:  PLACEMENT OF SETON;  Surgeon: Romie Levee, MD;  Location: [facility];  Service: General;  Laterality: N/A;   RECTAL EXAM UNDER ANESTHESIA N/A 08/20/2013   Procedure:  mucosal advancement flap ;  Surgeon: Romie Levee, MD;  Location: [facility];  Service: General;  Laterality: N/A;  parks anal retractor long rectal instrucments prone jack knife anal fistula    RECTAL ULTRASOUND N/A 07/20/2013   Procedure: RECTAL ULTRASOUND;  Surgeon: Romie Levee, MD;  Location: [facility];  Service: Endoscopy;  Laterality: N/A;    Social History [name]  reports that she has never smoked. She has never used smokeless tobacco. She reports that she does not drink alcohol and does not use drugs.  family history includes Diabetes in her brother, maternal aunt, maternal uncle, and mother; Heart attack in her father; Hypertension in her brother, maternal uncle, mother, and son; Seizures in her sister and sister.  Allergies  Allergen Reactions   Ampicillin Anaphylaxis   Atorvastatin Calcium Other (See Comments)    Pt reported lip swelling   Penicillins Anaphylaxis   Sulfonamide Derivatives Anaphylaxis   Hydrocodone-Acetaminophen Hives    Have to take a Benadryl to take med   Tramadol Itching   Tylenol [Acetaminophen] Itching       PHYSICAL EXAMINATION: Vital signs: BP (!) 96/58   Pulse 71   Ht 5\' 4"  (1.626 m)   Wt 145 lb (65.8 kg)   SpO2 97%   BMI 24.89 kg/m   Constitutional: generally well-appearing, no acute distress Psychiatric: alert and oriented x3, cooperative Eyes: extraocular movements intact, anicteric, conjunctiva pink Mouth: oral pharynx moist, no lesions Neck: supple no lymphadenopathy Cardiovascular: heart regular rate and rhythm, no murmur Lungs: clear to auscultation bilaterally Abdomen: soft, tender to palpation over the right lower quadrant without mass, nondistended, no obvious ascites, no peritoneal signs, normal bowel sounds, no organomegaly Rectal: Deferred until colonoscopy Extremities: no clubbing, cyanosis, or lower extremity edema bilaterally.  Right ankle larger than left ankle Skin: no lesions on visible extremities Neuro: No  focal deficits.  Nerves intact  ASSESSMENT:  1.  Worsening right lower quadrant pain. 2.  Chronic constipation.  Progressive.  Negative colonoscopy 2016 3.  Recent ER evaluation elevated liver test 4.  GERD.  EGD with incidental esophageal ring 2016 5.  Multiple medical problems including diabetes  PLAN:  1.  Schedule colonoscopy to evaluate worsening right lower quadrant pain and progressive constipation.  She will require more extensive prep due to her chronic constipation issues.The nature of the procedure, as well as the risks, benefits, and alternatives were carefully and thoroughly reviewed with the patient. Ample time for discussion and questions allowed. The patient understood, was satisfied, and agreed to proceed. 2.  Continue Linzess 3.  Reflux precautions 4.  Continue PPI 5.  Repeat liver tests 6.  Further recommendations after the above completed A total time of 30 minutes was spent preparing to see the patient, reviewing a myriad of data including ER evaluation, laboratories, x-rays, obtaining comprehensive history, performing medically appropriate physical examination, counseling and educating the patient regarding the above listed issues, ordering colonoscopy, ordering blood work, and documenting clinical information in the health record.

## 2023-06-11 NOTE — Addendum Note (Signed)
Addended by: Jeanine Luz on: 06/11/2023 02:33 PM   Modules accepted: Orders

## 2023-06-13 ENCOUNTER — Encounter: Payer: 59 | Admitting: Internal Medicine

## 2023-06-14 DIAGNOSIS — R1031 Right lower quadrant pain: Secondary | ICD-10-CM | POA: Diagnosis not present

## 2023-06-18 ENCOUNTER — Encounter: Payer: Self-pay | Admitting: Internal Medicine

## 2023-06-18 ENCOUNTER — Ambulatory Visit (AMBULATORY_SURGERY_CENTER): Payer: 59 | Admitting: Internal Medicine

## 2023-06-18 VITALS — BP 114/74 | HR 66 | Temp 95.7°F | Resp 10 | Ht 64.0 in | Wt 145.0 lb

## 2023-06-18 DIAGNOSIS — K5900 Constipation, unspecified: Secondary | ICD-10-CM

## 2023-06-18 DIAGNOSIS — R1031 Right lower quadrant pain: Secondary | ICD-10-CM | POA: Diagnosis not present

## 2023-06-18 MED ORDER — SODIUM CHLORIDE 0.9 % IV SOLN
500.0000 mL | Freq: Once | INTRAVENOUS | Status: DC
Start: 2023-06-18 — End: 2023-06-18

## 2023-06-18 NOTE — Progress Notes (Signed)
HISTORY OF PRESENT ILLNESS:   [name] is a 57 y.o. female with past medical history as listed below who has been followed in this office for GERD, chronic constipation, and chronic right lower quadrant pain.  She also has cholelithiasis, which is felt to be incidental.  She was last evaluated in this office August 22, 2021 regarding functional constipation, right lower quadrant pain, and GERD.  See that dictation.   Tells me that her right lower quadrant pain is worsened.  She tells me that her PCP thought she might have an inguinal or femoral hernia for which she is being referred to general surgery for an opinion.  She was recently seen in the emergency room for worsening right lower quadrant pain on 01/31/2023.  She underwent contrast-enhanced CT scan of the abdomen and pelvis which revealed no acute abnormalities.  See that report.  Blood work was unremarkable except for markedly elevated liver tests with AST 381 and ALT 397.  Normal prothrombin time.  Unremarkable CBC.  Acute hepatitis serologies were negative.  She does feel that her pain sometimes is worse with bowel movements.   For her GERD she continues on omeprazole 20 mg twice daily.  This is effective.  No dysphagia.  For constipation she continues on Linzess 290 mcg daily.  The most part, this helps.  She does continue to get narcotics intermittently for chronic pain.  She does have a spinal stimulator for her chronic low back pain.  As previously mentioned, she has noted to have cholelithiasis.  Recent CT scan no evidence for biliary ductal dilation or cholecystitis.   Last colonoscopy upper endoscopy were performed in 2016.  Colonoscopy was normal.  Upper endoscopy revealed a distal esophageal ring.  Otherwise normal.   REVIEW OF SYSTEMS:   All non-GI ROS negative unless otherwise stated in the HPI except for sinus allergy, anxiety, back pain, cough, hearing problems, night sweats, swelling of the feet, shortness of breath.        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 Claritin   Vertigo                 Past Surgical History:  Procedure Laterality Date   ANAL FISTULECTOMY   08/22/2012    Procedure: FISTULECTOMY ANAL;  Surgeon: Lodema Pilot, DO;  Location: [facility];  Service: General;  Laterality: N/A;  rectal examination under anesthesia with seton placement    ANAL FISTULECTOMY   09/17/2012    Procedure: FISTULECTOMY ANAL;  Surgeon: Lodema Pilot, DO;  Location: [facility];  Service: General;  Laterality: N/A;  possible fistulotomy   BACK SURGERY   10/19/2022   D & C HYSTEROSCOPY/ NOVASURE ENDOMETRIAL ABLATION/ I & D THROMBOSED HEMORROID   01-10-2007  DR Isaias Cowman ROSS   EXAMINATION UNDER ANESTHESIA   09/17/2012    Procedure: EXAM UNDER ANESTHESIA;  Surgeon: Lodema Pilot, DO;  Location: [facility];  Service: General;  Laterality: N/A;  Rectal exam under anesthesia   EXAMINATION UNDER ANESTHESIA N/A 05/14/2013    Procedure: EXAM UNDER ANESTHESIA;  Surgeon: Romie Levee, MD;  Location: [facility];  Service: General;  Laterality: N/A;   FISTULA PLUG   09/17/2012  Procedure: FISTULA PLUG;  Surgeon: Lodema Pilot, DO;  Location: [facility];  Service: General;  Laterality: N/A;  Possible fistula plug   HEMORRHOID SURGERY   04/28/2009    PPH INTERNAL HEMORRHOIDS   IRRIGATION AND DEBRIDEMENT ABSCESS   07/08/2012    Procedure: MINOR INCISION AND DRAINAGE OF ABSCESS;  Surgeon: Geryl Rankins, MD;  Location: [facility];  Service: Gynecology;  Laterality: N/A;  Vulvar Abscess   PLACEMENT OF SETON N/A 05/14/2013    Procedure:  PLACEMENT OF SETON;  Surgeon: Romie Levee, MD;  Location: [facility];  Service: General;  Laterality: N/A;   RECTAL EXAM  UNDER ANESTHESIA N/A 08/20/2013    Procedure: mucosal advancement flap ;  Surgeon: Romie Levee, MD;  Location: [facility];  Service: General;  Laterality: N/A;  parks anal retractor long rectal instrucments prone jack knife anal fistula    RECTAL ULTRASOUND N/A 07/20/2013    Procedure: RECTAL ULTRASOUND;  Surgeon: Romie Levee, MD;  Location: [facility];  Service: Endoscopy;  Laterality: N/A;          Social History [name]  reports that she has never smoked. She has never used smokeless tobacco. She reports that she does not drink alcohol and does not use drugs.   family history includes Diabetes in her brother, maternal aunt, maternal uncle, and mother; Heart attack in her father; Hypertension in her brother, maternal uncle, mother, and son; Seizures in her sister and sister.   Allergies       Allergies  Allergen Reactions   Ampicillin Anaphylaxis   Atorvastatin Calcium Other (See Comments)      Pt reported lip swelling   Penicillins Anaphylaxis   Sulfonamide Derivatives Anaphylaxis   Hydrocodone-Acetaminophen Hives      Have to take a Benadryl to take med   Tramadol Itching   Tylenol [Acetaminophen] Itching            PHYSICAL EXAMINATION: Vital signs: BP (!) 96/58   Pulse 71   Ht 5\' 4"  (1.626 m)   Wt 145 lb (65.8 kg)   SpO2 97%   BMI 24.89 kg/m   Constitutional: generally well-appearing, no acute distress Psychiatric: alert and oriented x3, cooperative Eyes: extraocular movements intact, anicteric, conjunctiva pink Mouth: oral pharynx moist, no lesions Neck: supple no lymphadenopathy Cardiovascular: heart regular rate and rhythm, no murmur Lungs: clear to auscultation bilaterally Abdomen: soft, tender to palpation over the right lower quadrant without mass, nondistended, no obvious ascites, no peritoneal signs, normal bowel sounds, no organomegaly Rectal: Deferred until colonoscopy Extremities: no clubbing, cyanosis, or lower extremity edema bilaterally.   Right ankle larger than left ankle Skin: no lesions on visible extremities Neuro: No focal deficits.  Nerves intact   ASSESSMENT:   1.  Worsening right lower quadrant pain. 2.  Chronic constipation.  Progressive.  Negative colonoscopy 2016 3.  Recent ER evaluation elevated liver test 4.  GERD.  EGD with incidental esophageal ring 2016 5.  Multiple medical problems including diabetes   PLAN:   1.  Schedule colonoscopy to evaluate worsening right lower quadrant pain and progressive constipation.  She will require more extensive prep due to her chronic constipation issues.The nature of the procedure, as well as the risks, benefits, and alternatives were carefully and thoroughly reviewed with the patient. Ample time for discussion and questions allowed. The patient understood, was satisfied, and agreed to proceed. 2.  Continue Linzess 3.  Reflux precautions 4.  Continue PPI 5.  Repeat  liver tests 6.  Further recommendations after the above completed

## 2023-06-18 NOTE — Progress Notes (Signed)
Pt's states no medical or surgical changes since previsit or office visit.   CGB 70. Pt asymptomatic.CRNA made aware. PIV started at 1335 and DW hung.  Recheck CBG 101 at 1350

## 2023-06-18 NOTE — Op Note (Signed)
[facility] Patient Name: [name] Procedure Date: 06/18/2023 1:53 PM MRN: [id number] Endoscopist: Wilhemina Bonito. Marina Goodell , MD, [id number] Age: 57 Referring MD:  Date of Birth: 07-07-1966 Gender: Female Account #: [id number] Procedure:                Colonoscopy Indications:              Abdominal pain in the right lower quadrant, Change                            in bowel habits, Constipation. Previous examination                            2016 was normal Medicines:                Monitored Anesthesia Care Procedure:                Pre-Anesthesia Assessment:                           - Prior to the procedure, a History and Physical                            was performed, and patient medications and                            allergies were reviewed. The patient's tolerance of                            previous anesthesia was also reviewed. The risks                            and benefits of the procedure and the sedation                            options and risks were discussed with the patient.                            All questions were answered, and informed consent                            was obtained. Prior Anticoagulants: The patient has                            taken no anticoagulant or antiplatelet agents. ASA                            Grade Assessment: II - A patient with mild systemic                            disease. After reviewing the risks and benefits,                            the patient was deemed in satisfactory condition to  undergo the procedure.                           After obtaining informed consent, the colonoscope                            was passed under direct vision. Throughout the                            procedure, the patient's blood pressure, pulse, and                            oxygen saturations were monitored continuously. The                            Olympus Scope SN: [id number] was introduced  through                            the anus and advanced to the the cecum, identified                            by appendiceal orifice and ileocecal valve. The                            terminal ileum, ileocecal valve, appendiceal                            orifice, and rectum were photographed. The quality                            of the bowel preparation was excellent. The                            colonoscopy was performed without difficulty. The                            patient tolerated the procedure well. The bowel                            preparation used was SUPREP via split dose                            instruction. Scope In: 2:04:18 PM Scope Out: 2:15:55 PM Scope Withdrawal Time: 0 hours 6 minutes 51 seconds  Total Procedure Duration: 0 hours 11 minutes 37 seconds  Findings:                 The terminal ileum appeared normal.                           Diverticula were found in the sigmoid colon.                           Internal hemorrhoids were found during  retroflexion. The hemorrhoids were small.                           The entire examined colon appeared normal on direct                            and retroflexion views. Complications:            No immediate complications. Estimated blood loss:                            None. Estimated Blood Loss:     Estimated blood loss: none. Impression:               - The examined portion of the ileum was normal.                           - There were sigmoid diverticula and small internal                            hemorrhoids.                           - The entire examined colon is otherwise normal on                            direct and retroflexion views.                           - No specimens collected. Recommendation:           - Repeat colonoscopy in 10 years for screening                            purposes.                           - Patient has a contact number available for                             emergencies. The signs and symptoms of potential                            delayed complications were discussed with the                            patient. Return to normal activities tomorrow.                            Written discharge instructions were provided to the                            patient.                           - Resume previous diet.                           -  Continue present medications. Wilhemina Bonito. Marina Goodell, MD 06/18/2023 2:26:38 PM This report has been signed electronically.

## 2023-06-18 NOTE — Patient Instructions (Signed)
Discharge instructions given. Handouts on Diverticulosis and Hemorrhoids. Resume previous medications. YOU HAD AN ENDOSCOPIC PROCEDURE TODAY AT THE [facility]:   Refer to the procedure report that was given to you for any specific questions about what was found during the examination.  If the procedure report does not answer your questions, please call your gastroenterologist to clarify.  If you requested that your care partner not be given the details of your procedure findings, then the procedure report has been included in a sealed envelope for you to review at your convenience later.  YOU SHOULD EXPECT: Some feelings of bloating in the abdomen. Passage of more gas than usual.  Walking can help get rid of the air that was put into your GI tract during the procedure and reduce the bloating. If you had a lower endoscopy (such as a colonoscopy or flexible sigmoidoscopy) you may notice spotting of blood in your stool or on the toilet paper. If you underwent a bowel prep for your procedure, you may not have a normal bowel movement for a few days.  Please Note:  You might notice some irritation and congestion in your nose or some drainage.  This is from the oxygen used during your procedure.  There is no need for concern and it should clear up in a day or so.  SYMPTOMS TO REPORT IMMEDIATELY:  Following lower endoscopy (colonoscopy or flexible sigmoidoscopy):  Excessive amounts of blood in the stool  Significant tenderness or worsening of abdominal pains  Swelling of the abdomen that is new, acute  Fever of 100F or higher  For urgent or emergent issues, a gastroenterologist can be reached at any hour by calling [phone]. Do not use MyChart messaging for urgent concerns.    DIET:  We do recommend a small meal at first, but then you may proceed to your regular diet.  Drink plenty of fluids but you should avoid alcoholic beverages for 24 hours.  ACTIVITY:  You should plan to take  it easy for the rest of today and you should NOT DRIVE or use heavy machinery until tomorrow (because of the sedation medicines used during the test).    FOLLOW UP: Our staff will call the number listed on your records the next business day following your procedure.  We will call around 7:15- 8:00 am to check on you and address any questions or concerns that you may have regarding the information given to you following your procedure. If we do not reach you, we will leave a message.     If any biopsies were taken you will be contacted by phone or by letter within the next 1-3 weeks.  Please call us at [phone] if you have not heard about the biopsies in 3 weeks.    SIGNATURES/CONFIDENTIALITY: You and/or your care partner have signed paperwork which will be entered into your electronic medical record.  These signatures attest to the fact that that the information above on your After Visit Summary has been reviewed and is understood.  Full responsibility of the confidentiality of this discharge information lies with you and/or your care-partner.  

## 2023-06-18 NOTE — Progress Notes (Signed)
Sedate, gd SR, tolerated procedure well, VSS, report to RN 

## 2023-06-19 ENCOUNTER — Telehealth: Payer: Self-pay | Admitting: *Deleted

## 2023-06-19 DIAGNOSIS — Z79891 Long term (current) use of opiate analgesic: Secondary | ICD-10-CM | POA: Diagnosis not present

## 2023-06-19 DIAGNOSIS — Z9689 Presence of other specified functional implants: Secondary | ICD-10-CM | POA: Diagnosis not present

## 2023-06-19 DIAGNOSIS — M5416 Radiculopathy, lumbar region: Secondary | ICD-10-CM | POA: Diagnosis not present

## 2023-06-19 NOTE — Telephone Encounter (Signed)
Post procedure follow up phone call. No answer at number given.  Left message on voicemail.  

## 2023-07-08 ENCOUNTER — Other Ambulatory Visit: Payer: Self-pay | Admitting: Internal Medicine

## 2023-07-08 DIAGNOSIS — J3089 Other allergic rhinitis: Secondary | ICD-10-CM

## 2023-07-11 ENCOUNTER — Encounter: Payer: Self-pay | Admitting: Internal Medicine

## 2023-07-11 ENCOUNTER — Ambulatory Visit: Payer: 59 | Attending: Internal Medicine | Admitting: Internal Medicine

## 2023-07-11 VITALS — BP 125/79 | HR 61 | Temp 98.2°F | Ht 64.0 in | Wt 149.0 lb

## 2023-07-11 DIAGNOSIS — E785 Hyperlipidemia, unspecified: Secondary | ICD-10-CM

## 2023-07-11 DIAGNOSIS — R7989 Other specified abnormal findings of blood chemistry: Secondary | ICD-10-CM

## 2023-07-11 DIAGNOSIS — R0982 Postnasal drip: Secondary | ICD-10-CM | POA: Diagnosis not present

## 2023-07-11 DIAGNOSIS — Z7984 Long term (current) use of oral hypoglycemic drugs: Secondary | ICD-10-CM

## 2023-07-11 DIAGNOSIS — B349 Viral infection, unspecified: Secondary | ICD-10-CM | POA: Diagnosis not present

## 2023-07-11 DIAGNOSIS — R21 Rash and other nonspecific skin eruption: Secondary | ICD-10-CM

## 2023-07-11 DIAGNOSIS — E1169 Type 2 diabetes mellitus with other specified complication: Secondary | ICD-10-CM

## 2023-07-11 LAB — POCT GLYCOSYLATED HEMOGLOBIN (HGB A1C): HbA1c, POC (controlled diabetic range): 5.7 % (ref 0.0–7.0)

## 2023-07-11 LAB — GLUCOSE, POCT (MANUAL RESULT ENTRY): POC Glucose: 96 mg/dL (ref 70–99)

## 2023-07-11 MED ORDER — BENZONATATE 100 MG PO CAPS
ORAL_CAPSULE | ORAL | 0 refills | Status: DC
Start: 2023-07-11 — End: 2023-09-27

## 2023-07-11 MED ORDER — TRIAMCINOLONE ACETONIDE 0.1 % EX CREA
1.0000 | TOPICAL_CREAM | Freq: Two times a day (BID) | CUTANEOUS | 0 refills | Status: DC
Start: 2023-07-11 — End: 2024-02-25

## 2023-07-11 NOTE — Progress Notes (Signed)
Patient ID: [name], female    DOB: 1965/12/09  MRN: [id number]  CC: Emesis (Vomiting, lethargic X2 days /Episode on Sunday 9/1/ of excessive sweating & crying )   Subjective: [name] is a 57 y.o. female who presents for UC visit. Her concerns today include:  pt with hx of HTN, chronic constipation, anal fistulas, vertigo, HL, chronic back pain (followed by pain specialist, TENS unit placed 10/2022), hot flashes (on gabapentin) and GERD   Patient presents today stating that she is not feeling well.  2 days ago she vomited once and it was foamy.  Associated with chills but no fever.  She had heavy sweating on the scalp yesterday.  Denies any congestion, sore throat, diarrhea.  Has a little cough at night associated with postnasal drip but that is chronic and has not progressed.  Endorses itchy rash on the palm of the hands x 2 days.  No initiating factors or new body products.  No recent travel, known tick bites and no recent working outside in the yard.  No arthralgias.  A little aching in the thigh muscles which is chronic and she thinks comes from her back.  Denies any headaches. Was seen in ER the end of June for abdominal pain.  CAT scan revealed no acute findings.  Noted to have elevations of AST ALT in the 300s.  Screen for hepatitis A, B, and C negative.  Referred to GI.  Saw Dr. Marina Goodell 06/11/2023.  Repeat LFTs showed AST and ALT decreased to 40/55.  Patient does not drink alcoholic beverages.  She continues to take rosuvastatin. Request refill on Tessalon Perles.   DM: Results for orders placed or performed in visit on 07/11/23  POCT glycosylated hemoglobin (Hb A1C)  Result Value Ref Range   Hemoglobin A1C     HbA1c POC (<> result, manual entry)     HbA1c, POC (prediabetic range)     HbA1c, POC (controlled diabetic range) 5.7 0.0 - 7.0 %  POCT glucose (manual entry)  Result Value Ref Range   POC Glucose 96 70 - 99 mg/dl  G2X 5.7  Patient Active Problem List   Diagnosis  Date Noted   Hyperlipidemia associated with type 2 diabetes mellitus (HCC) 02/15/2023   New onset type 2 diabetes mellitus (HCC) 02/14/2022   Altered consciousness 12/10/2018   Carpal tunnel syndrome of right wrist 06/12/2018   Postnasal drip 06/12/2018   Vertigo 10/18/2016   Disturbance of skin sensation 04/19/2014   Anal fistula 04/14/2013   UNSPECIFIED SITE OF SPRAIN AND STRAIN 07/07/2009   PEDAL EDEMA 06/29/2009   MUSCLE PAIN 06/23/2009   ANEMIA-IRON DEFICIENCY 03/11/2009   Allergic rhinitis 03/11/2009   SYNCOPE, VASOVAGAL 03/11/2009   HEADACHE 03/11/2009     Current Outpatient Medications on File Prior to Visit  Medication Sig Dispense Refill   albuterol (VENTOLIN HFA) 108 (90 Base) MCG/ACT inhaler Inhale 2 puffs into the lungs every 6 (six) hours as needed for wheezing or shortness of breath. 6.7 g 0   amLODipine (NORVASC) 10 MG tablet Take 1 tablet (10 mg total) by mouth daily. 90 tablet 3   cetirizine (ZYRTEC) 10 MG tablet TAKE 1 TABLET(10 MG) BY MOUTH DAILY 90 tablet 1   cyclobenzaprine (FLEXERIL) 10 MG tablet Take 10 mg by mouth 3 (three) times daily.     diclofenac sodium (VOLTAREN) 1 % GEL Apply 2 g topically 4 (four) times daily. 100 g 0   EPINEPHrine (EPIPEN 2-PAK) 0.3 mg/0.3 mL IJ SOAJ  injection Inject 0.3 mLs (0.3 mg total) into the muscle as needed for anaphylaxis. 1 each 0   fluticasone (FLONASE) 50 MCG/ACT nasal spray Place 1 spray into both nostrils daily. 16 g 3   gabapentin (NEURONTIN) 300 MG capsule Take 1 capsule (300 mg total) by mouth at bedtime. (Patient taking differently: Take 300 mg by mouth 2 (two) times daily.) 30 capsule 3   hydrOXYzine (VISTARIL) 25 MG capsule TAKE 1 CAPSULE(25 MG) BY MOUTH DAILY AS NEEDED FOR ANXIETY 90 capsule 0   ibuprofen (ADVIL) 600 MG tablet Take 1 tablet (600 mg total) by mouth every 8 (eight) hours as needed. 30 tablet 0   linaclotide (LINZESS) 290 MCG CAPS capsule TAKE 1 CAPSULE(290 MCG) BY MOUTH DAILY BEFORE BREAKFAST 90  capsule 1   meclizine (ANTIVERT) 25 MG tablet TAKE 1 TABLET(25 MG) BY MOUTH TWICE DAILY AS NEEDED FOR DIZZINESS 20 tablet 0   metFORMIN (GLUCOPHAGE) 500 MG tablet TAKE 1 TABLET(500 MG) BY MOUTH DAILY WITH BREAKFAST 90 tablet 1   omeprazole (PRILOSEC) 20 MG capsule TAKE 1 CAPSULE(20 MG) BY MOUTH TWICE DAILY BEFORE A MEAL 180 capsule 3   oxycodone (OXY-IR) 5 MG capsule Take 5 mg by mouth every 8 (eight) hours as needed.     promethazine (PHENERGAN) 25 MG tablet TAKE 1 TABLET(25 MG) BY MOUTH EVERY 8 HOURS AS NEEDED FOR NAUSEA OR VOMITING 20 tablet 0   rosuvastatin (CRESTOR) 20 MG tablet TAKE 1 TABLET(20 MG) BY MOUTH DAILY 30 tablet 3   No current facility-administered medications on file prior to visit.    Allergies  Allergen Reactions   Acetaminophen Itching and Rash    Other Reaction(s): Not available   Ampicillin Anaphylaxis   Atorvastatin Calcium Other (See Comments)    Pt reported lip swelling   Penicillins Anaphylaxis    Other Reaction(s): Not available   Sulfonamide Derivatives Anaphylaxis   Tramadol Itching and Rash   Hydrocodone-Acetaminophen Hives    Have to take a Benadryl to take med   Hydrocodone-Acetaminophen Hives and Rash    Have to take a Benadryl to take med   Amoxicillin     Other Reaction(s): Not available    Social History   Socioeconomic History   Marital status: Single    Spouse name: Not on file   Number of children: 2   Years of education: 10   Highest education level: Not on file  Occupational History   Occupation: N/A  Tobacco Use   Smoking status: Never   Smokeless tobacco: Never  Vaping Use   Vaping status: Never Used  Substance and Sexual Activity   Alcohol use: Never   Drug use: No   Sexual activity: Yes  Other Topics Concern   Not on file  Social History Narrative   Lives at home w/ her mother   Right-handed   Caffeine: 1 cup of tea per day   Social Determinants of Health   Financial Resource Strain: Low Risk  (04/19/2023)    Received from [facility]   Overall Financial Resource Strain (CARDIA)    Difficulty of Paying Living Expenses: Not hard at all  Food Insecurity: No Food Insecurity (04/19/2023)   Received from [facility]   Hunger Vital Sign    Worried About Running Out of Food in the Last Year: Never true    Ran Out of Food in the Last Year: Never true  Transportation Needs: No Transportation Needs (04/19/2023)   Received from [facility]   PRAPARE - Administrator, Civil Service (Medical): No    Lack of Transportation (Non-Medical): No  Physical Activity: Insufficiently Active (11/09/2022)   Exercise Vital Sign    Days of Exercise per Week: 7 days    Minutes of Exercise per Session: 10 min  Stress: No Stress Concern Present (11/09/2022)   [employer] of Occupational Health - Occupational Stress Questionnaire    Feeling of Stress : Not at all  Social Connections: Socially Isolated (11/09/2022)   Social Connection and Isolation Panel [NHANES]    Frequency of Communication with Friends and Family: More than three times a week    Frequency of Social Gatherings with Friends and Family: More than three times a week    Attends Religious Services: Never    Database administrator or Organizations: No    Attends Banker Meetings: Never    Marital Status: Never married  Intimate Partner Violence: Not At Risk (11/09/2022)   Humiliation, Afraid, Rape, and Kick questionnaire    Fear of Current or Ex-Partner: No    Emotionally Abused: No    Physically Abused: No    Sexually Abused: No    Family History  Problem Relation Age of Onset   Diabetes Mother    Hypertension Mother    Heart attack Father    Seizures Sister    Seizures Sister    Diabetes Brother    Hypertension Brother    Diabetes Maternal Aunt    Diabetes Maternal Uncle    Hypertension Maternal Uncle    Hypertension Son    Colon cancer Neg Hx    Stomach cancer Neg Hx      Past Surgical History:  Procedure Laterality Date   ANAL FISTULECTOMY  08/22/2012   Procedure: FISTULECTOMY ANAL;  Surgeon: Lodema Pilot, DO;  Location: [facility];  Service: General;  Laterality: N/A;  rectal examination under anesthesia with seton placement    ANAL FISTULECTOMY  09/17/2012   Procedure: FISTULECTOMY ANAL;  Surgeon: Lodema Pilot, DO;  Location: [facility];  Service: General;  Laterality: N/A;  possible fistulotomy   BACK SURGERY  10/19/2022   D & C HYSTEROSCOPY/ NOVASURE ENDOMETRIAL ABLATION/ I & D THROMBOSED HEMORROID  01-10-2007  DR Isaias Cowman ROSS   EXAMINATION UNDER ANESTHESIA  09/17/2012   Procedure: EXAM UNDER ANESTHESIA;  Surgeon: Lodema Pilot, DO;  Location: [facility];  Service: General;  Laterality: N/A;  Rectal exam under anesthesia   EXAMINATION UNDER ANESTHESIA N/A 05/14/2013   Procedure: EXAM UNDER ANESTHESIA;  Surgeon: Romie Levee, MD;  Location: [facility];  Service: General;  Laterality: N/A;   FISTULA PLUG  09/17/2012   Procedure: FISTULA PLUG;  Surgeon: Lodema Pilot, DO;  Location: [facility];  Service: General;  Laterality: N/A;  Possible fistula plug   HEMORRHOID SURGERY  04/28/2009   PPH INTERNAL HEMORRHOIDS   IRRIGATION AND DEBRIDEMENT ABSCESS  07/08/2012   Procedure: MINOR INCISION AND DRAINAGE OF ABSCESS;  Surgeon: Geryl Rankins, MD;  Location: [facility];  Service: Gynecology;  Laterality: N/A;  Vulvar Abscess   PLACEMENT OF SETON N/A 05/14/2013   Procedure:  PLACEMENT OF SETON;  Surgeon: Romie Levee, MD;  Location: [facility];  Service: General;  Laterality: N/A;   RECTAL EXAM UNDER ANESTHESIA N/A 08/20/2013   Procedure: mucosal advancement flap ;  Surgeon: Romie Levee, MD;  Location: [facility];  Service: General;  Laterality: N/A;  parks anal  retractor long rectal instrucments prone jack knife anal fistula    RECTAL ULTRASOUND N/A 07/20/2013   Procedure:  RECTAL ULTRASOUND;  Surgeon: Romie Levee, MD;  Location: [facility];  Service: Endoscopy;  Laterality: N/A;    ROS: Review of Systems Negative except as stated above  PHYSICAL EXAM: BP 125/79 (BP Location: Left Arm, Patient Position: Sitting, Cuff Size: Normal)   Pulse 61   Temp 98.2 F (36.8 C) (Oral)   Ht 5\' 4"  (1.626 m)   Wt 149 lb (67.6 kg)   SpO2 98%   BMI 25.58 kg/m   Wt Readings from Last 3 Encounters:  07/11/23 149 lb (67.6 kg)  06/18/23 145 lb (65.8 kg)  06/11/23 145 lb (65.8 kg)    Physical Exam  General appearance -patient looks a little tired but she is alert and answers questions appropriately. Mental status - normal mood, behavior, speech, dress, motor activity, and thought processes Eyes -Pink conjunctiva Nose -mild enlargement of nasal turbinates Mouth - mucous membranes moist, pharynx normal without lesions Neck - supple, no significant adenopathy Chest - clear to auscultation, no wheezes, rales or rhonchi, symmetric air entry Heart - normal rate, regular rhythm, normal S1, S2, no murmurs, rubs, clicks or gallops Abdomen - soft, nontender, nondistended, no masses or organomegaly Extremities - peripheral pulses normal, no pedal edema, no clubbing or cyanosis Skin -several small faint macular spots noted on the palm opposite the thenar eminence of both hands.  No rash noted on the feet.      Latest Ref Rng & Units 06/11/2023    2:53 PM 05/03/2023    8:21 AM 06/14/2022   12:02 PM  CMP  Glucose 70 - 99 mg/dL  161  91   BUN 6 - 20 mg/dL  9  8   Creatinine 0.96 - 1.00 mg/dL  0.45  4.09   Sodium 811 - 145 mmol/L  142  141   Potassium 3.5 - 5.1 mmol/L  4.3  4.5   Chloride 98 - 111 mmol/L  102  99   CO2 22 - 32 mmol/L  27  26   Calcium 8.9 - 10.3 mg/dL  9.5  91.4   Total Protein 6.0 - 8.3 g/dL 7.6  7.0    Total Bilirubin 0.2 - 1.2 mg/dL 0.4  0.6    Alkaline Phos 39 - 117 U/L 59  118    AST 0 - 37 U/L 40  381    ALT 0 - 35 U/L 55  397     Lipid Panel      Component Value Date/Time   CHOL 170 02/12/2022 0937   TRIG 107 02/12/2022 0937   HDL 58 02/12/2022 0937   CHOLHDL 2.9 02/12/2022 0937   CHOLHDL 3.8 03/19/2009 0414   VLDL 13 03/19/2009 0414   LDLCALC 93 02/12/2022 0937    CBC    Component Value Date/Time   WBC 4.1 05/03/2023 0821   RBC 4.32 05/03/2023 0821   HGB 12.6 05/03/2023 0821   HGB 13.5 10/15/2022 1132   HGB 13.8 09/05/2009 1347   HCT 38.5 05/03/2023 0821   HCT 40.4 10/15/2022 1132   HCT 41.3 09/05/2009 1347   PLT PLATELET CLUMPS NOTED ON SMEAR, UNABLE TO ESTIMATE 05/03/2023 0821   PLT 304 10/15/2022 1132   MCV 89.1 05/03/2023 0821   MCV 89 10/15/2022 1132   MCV 92.0 09/05/2009 1347   MCH 29.2 05/03/2023 0821   MCHC 32.7 05/03/2023 0821   RDW 12.3 05/03/2023 0821  RDW 12.3 10/15/2022 1132   RDW 13.0 09/05/2009 1347   LYMPHSABS 2.2 05/03/2023 0821   LYMPHSABS 2.9 03/16/2019 1427   LYMPHSABS 2.1 09/05/2009 1347   MONOABS 0.2 05/03/2023 0821   MONOABS 0.3 09/05/2009 1347   EOSABS 0.1 05/03/2023 0821   EOSABS 0.1 03/16/2019 1427   BASOSABS 0.0 05/03/2023 0821   BASOSABS 0.1 03/16/2019 1427   BASOSABS 0.1 09/05/2009 1347    ASSESSMENT AND PLAN: 1. Viral illness Most likely she had an acute viral illness 2 days ago.  Symptoms not suggestive of COVID.  Stay hydrated.  We will check some baseline labs today. - Comprehensive metabolic panel - CBC With Diff/Platelet  2. Post-nasal drip - benzonatate (TESSALON) 100 MG capsule; TAKE 1 CAPSULE(100 MG) BY MOUTH TWICE DAILY AS NEEDED FOR COUGH  Dispense: 30 capsule; Refill: 0  3. Type 2 diabetes mellitus with hyperlipidemia (HCC) A1c is at goal.  We will discuss her diabetes in more detail at her routine follow-up visit next month. - POCT glycosylated hemoglobin (Hb A1C) - POCT glucose (manual entry) - Comprehensive metabolic panel - CBC With Diff/Platelet  4. Rash of both hands - HIV antibody (with reflex) - RPR w/reflex to TrepSure - triamcinolone cream  (KENALOG) 0.1 %; Apply 1 Application topically 2 (two) times daily.  Dispense: 30 g; Refill: 0  5. Abnormal LFTs AST and ALT significantly declined on recheck.  We will recheck again today.    Patient was given the opportunity to ask questions.  Patient verbalized understanding of the plan and was able to repeat key elements of the plan.   This documentation was completed using Paediatric nurse.  Any transcriptional errors are unintentional.  Orders Placed This Encounter  Procedures   Comprehensive metabolic panel   HIV antibody (with reflex)   RPR w/reflex to TrepSure   CBC With Diff/Platelet   POCT glycosylated hemoglobin (Hb A1C)   POCT glucose (manual entry)     Requested Prescriptions   Signed Prescriptions Disp Refills   benzonatate (TESSALON) 100 MG capsule 30 capsule 0    Sig: TAKE 1 CAPSULE(100 MG) BY MOUTH TWICE DAILY AS NEEDED FOR COUGH   triamcinolone cream (KENALOG) 0.1 % 30 g 0    Sig: Apply 1 Application topically 2 (two) times daily.    Return for Nurse only visit in 1 wk for flu shot.Jonah Blue, MD, FACP

## 2023-07-12 LAB — CBC WITH DIFF/PLATELET
Basophils Absolute: 0.1 10*3/uL (ref 0.0–0.2)
Basos: 2 %
EOS (ABSOLUTE): 0.1 10*3/uL (ref 0.0–0.4)
Eos: 1 %
Hematocrit: 41 % (ref 34.0–46.6)
Hemoglobin: 13.1 g/dL (ref 11.1–15.9)
Immature Grans (Abs): 0 10*3/uL (ref 0.0–0.1)
Immature Granulocytes: 0 %
Lymphocytes Absolute: 1.9 10*3/uL (ref 0.7–3.1)
Lymphs: 47 %
MCH: 29.3 pg (ref 26.6–33.0)
MCHC: 32 g/dL (ref 31.5–35.7)
MCV: 92 fL (ref 79–97)
Monocytes Absolute: 0.3 10*3/uL (ref 0.1–0.9)
Monocytes: 7 %
Neutrophils Absolute: 1.7 10*3/uL (ref 1.4–7.0)
Neutrophils: 43 %
Platelets: 262 10*3/uL (ref 150–450)
RBC: 4.47 x10E6/uL (ref 3.77–5.28)
RDW: 12.2 % (ref 11.7–15.4)
WBC: 4.1 10*3/uL (ref 3.4–10.8)

## 2023-07-12 LAB — COMPREHENSIVE METABOLIC PANEL
ALT: 23 IU/L (ref 0–32)
AST: 24 IU/L (ref 0–40)
Albumin: 4.7 g/dL (ref 3.8–4.9)
Alkaline Phosphatase: 71 IU/L (ref 44–121)
BUN/Creatinine Ratio: 14 (ref 9–23)
BUN: 10 mg/dL (ref 6–24)
Bilirubin Total: 0.2 mg/dL (ref 0.0–1.2)
CO2: 26 mmol/L (ref 20–29)
Calcium: 9.6 mg/dL (ref 8.7–10.2)
Chloride: 104 mmol/L (ref 96–106)
Creatinine, Ser: 0.74 mg/dL (ref 0.57–1.00)
Globulin, Total: 2.3 g/dL (ref 1.5–4.5)
Glucose: 98 mg/dL (ref 70–99)
Potassium: 4.5 mmol/L (ref 3.5–5.2)
Sodium: 144 mmol/L (ref 134–144)
Total Protein: 7 g/dL (ref 6.0–8.5)
eGFR: 94 mL/min/{1.73_m2} (ref >59)

## 2023-07-12 LAB — HIV ANTIBODY (ROUTINE TESTING W REFLEX): HIV Screen 4th Generation wRfx: NONREACTIVE

## 2023-07-14 LAB — RPR W/REFLEX TO TREPSURE: RPR: NONREACTIVE

## 2023-07-14 LAB — TREPONEMAL ANTIBODIES, TPPA: Treponemal Antibodies, TPPA: NONREACTIVE

## 2023-07-15 ENCOUNTER — Other Ambulatory Visit: Payer: Self-pay | Admitting: Internal Medicine

## 2023-07-15 DIAGNOSIS — Z1231 Encounter for screening mammogram for malignant neoplasm of breast: Secondary | ICD-10-CM

## 2023-07-16 ENCOUNTER — Ambulatory Visit: Payer: 59 | Admitting: Physician Assistant

## 2023-07-18 ENCOUNTER — Encounter: Payer: Self-pay | Admitting: Internal Medicine

## 2023-07-18 ENCOUNTER — Ambulatory Visit (INDEPENDENT_AMBULATORY_CARE_PROVIDER_SITE_OTHER): Payer: 59 | Admitting: Internal Medicine

## 2023-07-18 VITALS — BP 110/70 | HR 85 | Temp 98.3°F | Ht 64.0 in | Wt 146.4 lb

## 2023-07-18 DIAGNOSIS — R053 Chronic cough: Secondary | ICD-10-CM | POA: Diagnosis not present

## 2023-07-18 MED ORDER — MONTELUKAST SODIUM 10 MG PO TABS: 10.0000 mg | ORAL_TABLET | Freq: Every day | ORAL | 11 refills | Status: DC

## 2023-07-18 MED ORDER — FLUTICASONE FUROATE-VILANTEROL 100-25 MCG/ACT IN AEPB: 1.0000 | INHALATION_SPRAY | Freq: Every day | RESPIRATORY_TRACT | 5 refills | Status: DC

## 2023-07-18 NOTE — Progress Notes (Signed)
[name]    [id number]    Jul 06, 1966  Primary Care Physician:Johnson, Binnie Rail, MD Date of Appointment: 07/18/2023 Established Patient Visit  Chief complaint:   Chief Complaint  Patient presents with   Acute Visit    Dry Cough at night     HPI: [name] is a 57 y.o. woman with chronic cough.   Interval Updates: Last seen by me almost 2 years ago. During that time felt cough was most likely secondary to post nasal drainage.  Dr. Laural Benes gave her an albuterol inhaler for her shortness breath. She takes it about twice a day.   She is "feeling bad" sweating. She has a dry cough. This is the same as it always is. No increasing use of albuterol inhaler. She has a little bit of a runny nose.   She is eating and drinking ok. No feers.   She just doesn't feel good  Symptoms have been going on for a couple of   Added singulair to zyrtec and flonase for rhinitis. This is helping.   Plan had been to get spiro and feno for further evaluation but this hasn't been scheduled yet.   I have reviewed the patient's family social and past medical history and updated as appropriate.   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 Claritin   Vertigo     Past Surgical History:  Procedure Laterality Date   ANAL FISTULECTOMY  08/22/2012   Procedure: FISTULECTOMY ANAL;  Surgeon: Lodema Pilot, DO;  Location: [facility];  Service: General;  Laterality: N/A;  rectal examination under anesthesia with seton placement    ANAL FISTULECTOMY  09/17/2012   Procedure: FISTULECTOMY ANAL;  Surgeon: Lodema Pilot, DO;  Location: [facility];  Service: General;  Laterality: N/A;  possible fistulotomy   BACK SURGERY  10/19/2022   D & C HYSTEROSCOPY/ NOVASURE ENDOMETRIAL ABLATION/ I & D THROMBOSED  HEMORROID  01-10-2007  DR Isaias Cowman ROSS   EXAMINATION UNDER ANESTHESIA  09/17/2012   Procedure: EXAM UNDER ANESTHESIA;  Surgeon: Lodema Pilot, DO;  Location: [facility];  Service: General;  Laterality: N/A;  Rectal exam under anesthesia   EXAMINATION UNDER ANESTHESIA N/A 05/14/2013   Procedure: EXAM UNDER ANESTHESIA;  Surgeon: Romie Levee, MD;  Location: [facility];  Service: General;  Laterality: N/A;   FISTULA PLUG  09/17/2012   Procedure: FISTULA PLUG;  Surgeon: Lodema Pilot, DO;  Location: [facility];  Service: General;  Laterality: N/A;  Possible fistula plug   HEMORRHOID SURGERY  04/28/2009   PPH INTERNAL HEMORRHOIDS   IRRIGATION AND DEBRIDEMENT ABSCESS  07/08/2012   Procedure: MINOR INCISION AND DRAINAGE OF ABSCESS;  Surgeon: Geryl Rankins, MD;  Location: [facility];  Service: Gynecology;  Laterality: N/A;  Vulvar Abscess   PLACEMENT OF SETON N/A 05/14/2013   Procedure:  PLACEMENT OF SETON;  Surgeon: Romie Levee, MD;  Location: [facility];  Service: General;  Laterality: N/A;   RECTAL EXAM UNDER ANESTHESIA N/A 08/20/2013   Procedure: mucosal advancement flap ;  Surgeon: Romie Levee, MD;  Location: [facility];  Service: General;  Laterality: N/A;  parks anal retractor long rectal instrucments prone jack knife anal fistula  RECTAL ULTRASOUND N/A 07/20/2013   Procedure: RECTAL ULTRASOUND;  Surgeon: Romie Levee, MD;  Location: [facility];  Service: Endoscopy;  Laterality: N/A;    Family History  Problem Relation Age of Onset   Diabetes Mother    Hypertension Mother    Heart attack Father    Seizures Sister    Seizures Sister    Diabetes Brother    Hypertension Brother    Diabetes Maternal Aunt    Diabetes Maternal Uncle    Hypertension Maternal Uncle    Hypertension Son    Colon cancer Neg Hx    Stomach cancer Neg Hx     Social History   Occupational History   Occupation: N/A  Tobacco Use    Smoking status: Never   Smokeless tobacco: Never  Vaping Use   Vaping status: Never Used  Substance and Sexual Activity   Alcohol use: Never   Drug use: No   Sexual activity: Yes     Physical Exam: Blood pressure 110/70, pulse 85, temperature 98.3 F (36.8 C), temperature source Oral, height 5\' 4"  (1.626 m), weight 146 lb 6.4 oz (66.4 kg), SpO2 99%.  Gen:      No acute distress ENT:  no nasal polyps, mucus membranes moist Lungs:    No increased respiratory effort, symmetric chest wall excursion, clear to auscultation bilaterally, no wheezes or crackles CV:         Regular rate and rhythm; no murmurs, rubs, or gallops.  No pedal edema   Data Reviewed: Imaging: I have personally reviewed the   PFTs:      No data to display         I have personally reviewed the patient's PFTs and   Labs:  Immunization status: Immunization History  Administered Date(s) Administered   Influenza Whole 08/10/2009   Influenza,inj,Quad PF,6+ Mos 11/21/2017, 06/18/2019, 06/27/2020, 10/15/2022   Influenza-Unspecified 08/05/2016   Moderna Sars-Covid-2 Vaccination 01/22/2020, 02/18/2020, 08/30/2020, 04/13/2021   Pfizer Covid-19 Vaccine Bivalent Booster 4yrs & up 07/26/2021   Pneumococcal Conjugate-13 09/18/2020   Tdap 10/13/2018   Zoster Recombinant(Shingrix) 11/17/2020, 07/09/2021    External Records Personally Reviewed: pcp  Assessment:  Acute viral URI, slow to resolve Possible asthma not well controlled Chronic allergic rhinitis controlled  Plan/Recommendations: I hope you feel better soon. I suspect you have a viral infection that is slowly resolving. Get plenty of rest.  Continue singulair, cetirizine, flonase for allergies You are using your albuterol inhaler frequently for shortness of breath. Let's start a maintenance inhaler called breo 1 puff once daily for your breathing. Gargle after use.  Spirometry at next visit.   Feno obtained today 22 ppb  Return to  Care: Return in about 6 months (around 01/15/2024).   Durel Salts, MD Pulmonary and Critical Care Medicine [facility] Office:[phone]

## 2023-07-18 NOTE — Patient Instructions (Addendum)
It was a pleasure to see you today!  Please schedule follow up scheduled with myself in 6 months.  If my schedule is not open yet, we will contact you with a reminder closer to that time. Please call [phone] if you haven't heard from [place] a month before, and always call us sooner if issues or concerns arise. You can also send [place] a message through MyChart, but but aware that this is not to be used for urgent issues and it may take up to 5-7 days to receive a reply. Please be aware that you will likely be able to view your results before I have a chance to respond to them. Please give [place] 5 business days to respond to any non-urgent results.    I hope you feel better soon. I suspect you have a viral infection that is slowly resolving. Get plenty of rest.  Continue singulair, cetirizine, flonase for allergies You are using your albuterol inhaler frequently for shortness of breath. Let's start a maintenance inhaler called breo 1 puff once daily for your breathing. Gargle after use.  Spirometry at next visit.

## 2023-07-19 ENCOUNTER — Ambulatory Visit: Payer: 59

## 2023-07-29 ENCOUNTER — Other Ambulatory Visit: Payer: Self-pay | Admitting: Pharmacist

## 2023-07-29 ENCOUNTER — Ambulatory Visit: Payer: Self-pay | Admitting: *Deleted

## 2023-07-29 MED ORDER — MECLIZINE HCL 25 MG PO TABS: ORAL_TABLET | ORAL | 0 refills | Status: DC

## 2023-07-29 NOTE — Telephone Encounter (Signed)
Summary: vertigo   Pt states she is dizzy and wants dr Laural Benes to call her in her vertigo pills.  She has had this before      Chief Complaint: Patient is requesting refill of ACZ:[id number] (ANTIVERT) 25 MG tablet Symptoms: vertigo for 1 week- not getting better Frequency: chronic- patient states she has hx vertigo- she has had dizziness for 1 week and is requesting RF of her medication Pertinent Negatives: Patient denies weakness, numbness, vomiting, earache  Disposition: [] ED /[] Urgent Care (no appt availability in office) / [] Appointment(In office/virtual)/ []  [facility]/ [] Home Care/ [] Refused Recommended Disposition /[] [facility]/ [x]  Follow-up with PCP Additional Notes: Patient is requesting medication for vertigo: meclizine. Patient states it has been a while since she had vertigo and is out of medication    Reason for Disposition  MILD dizziness is a chronic symptom (recurrent or ongoing AND present > 4 weeks)  Answer Assessment - Initial Assessment Questions 1. DESCRIPTION: "Describe your dizziness."     Dizzy- spinning sensation- hard to get up and move 2. VERTIGO: "Do you feel like either you or the room is spinning or tilting?"      yes 3. LIGHTHEADED: "Do you feel lightheaded?" (e.g., somewhat faint, woozy, weak upon standing)     Yes- patient states that is one of her symptoms 4. SEVERITY: "How bad is it?"  "Can you walk?"   - MILD: Feels slightly dizzy and unsteady, but is walking normally.   - MODERATE: Feels unsteady when walking, but not falling; interferes with normal activities (e.g., school, work).   - SEVERE: Unable to walk without falling, or requires assistance to walk without falling.     mild 5. ONSET:  "When did the dizziness begin?"     1 week 6. AGGRAVATING FACTORS: "Does anything make it worse?" (e.g., standing, change in head position)     Changing position  7. CAUSE: "What do you think is causing the dizziness?"      vertigo 8. RECURRENT SYMPTOM: "Have you had dizziness before?" If Yes, ask: "When was the last time?" "What happened that time?"     Yes- it has been a while since- uses meclizine (ANTIVERT) 25 MG tablet 9. OTHER SYMPTOMS: "Do you have any other symptoms?" (e.g., headache, weakness, numbness, vomiting, earache)     Headache  Protocols used: Dizziness - Vertigo-A-AH

## 2023-08-05 ENCOUNTER — Other Ambulatory Visit: Payer: Self-pay | Admitting: Internal Medicine

## 2023-08-05 DIAGNOSIS — I10 Essential (primary) hypertension: Secondary | ICD-10-CM

## 2023-08-06 NOTE — Telephone Encounter (Signed)
Request too soon last refilled 05/23/23 #90, 3RF Requested Prescriptions  Pending Prescriptions Disp Refills   amLODipine (NORVASC) 10 MG tablet [Pharmacy Med Name: AMLODIPINE BESYLATE 10MG  TABLETS] 90 tablet 3    Sig: TAKE 1 TABLET(10 MG) BY MOUTH DAILY     Cardiovascular: Calcium Channel Blockers 2 Passed - 08/05/2023 11:06 AM      Passed - Last BP in normal range    BP Readings from Last 1 Encounters:  07/18/23 110/70         Passed - Last Heart Rate in normal range    Pulse Readings from Last 1 Encounters:  07/18/23 85         Passed - Valid encounter within last 6 months    Recent Outpatient Visits           3 weeks ago Viral illness   [facility] Jonah Blue B, MD   2 months ago Right lower quadrant abdominal pain   [facility] [place] [place], [place]   4 months ago Dysuria   [facility] Jonah Blue B, MD   5 months ago Type 2 diabetes mellitus with hyperlipidemia [facility])   [facility] Marcine Matar, MD   9 months ago New onset type 2 diabetes mellitus [facility])   [facility] Marcine Matar, MD       Future Appointments             In 2 weeks Marcine Matar, MD [facility]

## 2023-08-15 ENCOUNTER — Ambulatory Visit: Payer: 59 | Attending: Family Medicine

## 2023-08-15 DIAGNOSIS — Z23 Encounter for immunization: Secondary | ICD-10-CM

## 2023-08-15 NOTE — Progress Notes (Signed)
Flu vaccine administered per protocols.  Information sheet given. Patient denies and pain or discomfort at injection site. Tolerated injection well no reaction.  

## 2023-08-23 ENCOUNTER — Encounter: Payer: Self-pay | Admitting: Internal Medicine

## 2023-08-23 ENCOUNTER — Ambulatory Visit: Payer: 59 | Attending: Internal Medicine | Admitting: Internal Medicine

## 2023-08-23 VITALS — BP 117/79 | HR 75 | Temp 98.2°F | Ht 64.0 in | Wt 147.0 lb

## 2023-08-23 DIAGNOSIS — I152 Hypertension secondary to endocrine disorders: Secondary | ICD-10-CM

## 2023-08-23 DIAGNOSIS — E1169 Type 2 diabetes mellitus with other specified complication: Secondary | ICD-10-CM

## 2023-08-23 DIAGNOSIS — K219 Gastro-esophageal reflux disease without esophagitis: Secondary | ICD-10-CM | POA: Diagnosis not present

## 2023-08-23 DIAGNOSIS — K5909 Other constipation: Secondary | ICD-10-CM | POA: Diagnosis not present

## 2023-08-23 DIAGNOSIS — Z7984 Long term (current) use of oral hypoglycemic drugs: Secondary | ICD-10-CM | POA: Diagnosis not present

## 2023-08-23 DIAGNOSIS — R42 Dizziness and giddiness: Secondary | ICD-10-CM | POA: Diagnosis not present

## 2023-08-23 DIAGNOSIS — E1159 Type 2 diabetes mellitus with other circulatory complications: Secondary | ICD-10-CM | POA: Diagnosis not present

## 2023-08-23 DIAGNOSIS — E119 Type 2 diabetes mellitus without complications: Secondary | ICD-10-CM

## 2023-08-23 DIAGNOSIS — M5416 Radiculopathy, lumbar region: Secondary | ICD-10-CM

## 2023-08-23 DIAGNOSIS — E785 Hyperlipidemia, unspecified: Secondary | ICD-10-CM | POA: Diagnosis not present

## 2023-08-23 MED ORDER — LINACLOTIDE 290 MCG PO CAPS
ORAL_CAPSULE | ORAL | 1 refills | Status: DC
Start: 2023-08-23 — End: 2024-05-11

## 2023-08-23 MED ORDER — METFORMIN HCL 500 MG PO TABS
ORAL_TABLET | ORAL | 1 refills | Status: DC
Start: 2023-08-23 — End: 2024-02-11

## 2023-08-23 MED ORDER — KETOROLAC TROMETHAMINE 30 MG/ML IJ SOLN
30.0000 mg | Freq: Once | INTRAMUSCULAR | Status: AC
Start: 2023-08-23 — End: 2023-08-23
  Administered 2023-08-23: 30 mg via INTRAMUSCULAR

## 2023-08-23 MED ORDER — AMLODIPINE BESYLATE 10 MG PO TABS
10.0000 mg | ORAL_TABLET | Freq: Every day | ORAL | 1 refills | Status: DC
Start: 2023-08-23 — End: 2024-02-25

## 2023-08-23 MED ORDER — OMEPRAZOLE 20 MG PO CPDR
20.0000 mg | DELAYED_RELEASE_CAPSULE | Freq: Two times a day (BID) | ORAL | 3 refills | Status: DC
Start: 2023-08-23 — End: 2024-08-14

## 2023-08-23 MED ORDER — IBUPROFEN 600 MG PO TABS
600.0000 mg | ORAL_TABLET | Freq: Three times a day (TID) | ORAL | 1 refills | Status: DC | PRN
Start: 2023-08-23 — End: 2024-08-01

## 2023-08-23 MED ORDER — ROSUVASTATIN CALCIUM 20 MG PO TABS
20.0000 mg | ORAL_TABLET | Freq: Every day | ORAL | 1 refills | Status: DC
Start: 2023-08-23 — End: 2024-02-20

## 2023-08-23 NOTE — Progress Notes (Signed)
Patient ID: [name], female    DOB: 08-27-1966  MRN: [id number]  CC: Diabetes (DM f/u. Med refills. /Reports dizziness X3 - 4 mo/Pain on R lower back radiating to R leg X2 weeks /Already received flu vax)   Subjective: [name] is a 57 y.o. female who presents for chronic ds management. Her concerns today include:  pt with hx of HTN, chronic constipation, anal fistulas, vertigo, HL, chronic back pain (followed by pain specialist, TENS unit placed 10/2022), hot flashes (on gabapentin) and GERD   DM: Lab Results  Component Value Date   HGBA1C 5.7 07/11/2023  Reports compliance with metformin 500 mg daily  Doing good with eating habits Trys to get out and walk daily except for past wk due to back flare up  HL: Reports compliance with Crestor 20 mg daily.  Liver function test done last month had normalized.  HTN: Reports compliance with Norvasc 10 mg daily. Complains of little dizziness at times when she puts down the window in the car when driving for past 3-4 mths.  Last only several seconds.  Thinks it is related to her allergies. She has Zyrtec, Flonase and Singulair; takes PRN than every day.   CBC done last month was normal.  C/o flare in back pain RT side x 1 wk. No initiating factors or strenuous  activity.  Has f/u with her pain specialist later this mth.  No incontinence of bowel or bladder. Had TENS unit placed in lower back 10/19/2022 by pain specialist.  She is on gabapentin 300 mg twice a day, cyclobenzaprine and oxycodone 5 mg Q 4 hrs PRN through her pain specialist.  Requesting Toradol shot to carry her over until she sees her pain specialist later this month.  Request refill on ibuprofen and Linzess.  HM: Overdue for mammogram.  Referral submitted earlier this year.  Patient states that she will call them today to schedule an appointment. Patient Active Problem List   Diagnosis Date Noted   Hyperlipidemia associated with type 2 diabetes mellitus (HCC) 02/15/2023    New onset type 2 diabetes mellitus (HCC) 02/14/2022   Altered consciousness 12/10/2018   Carpal tunnel syndrome of right wrist 06/12/2018   Postnasal drip 06/12/2018   Vertigo 10/18/2016   Disturbance of skin sensation 04/19/2014   Anal fistula 04/14/2013   UNSPECIFIED SITE OF SPRAIN AND STRAIN 07/07/2009   PEDAL EDEMA 06/29/2009   MUSCLE PAIN 06/23/2009   ANEMIA-IRON DEFICIENCY 03/11/2009   Allergic rhinitis 03/11/2009   SYNCOPE, VASOVAGAL 03/11/2009   HEADACHE 03/11/2009     Current Outpatient Medications on File Prior to Visit  Medication Sig Dispense Refill   albuterol (VENTOLIN HFA) 108 (90 Base) MCG/ACT inhaler Inhale 2 puffs into the lungs every 6 (six) hours as needed for wheezing or shortness of breath. 6.7 g 0   amLODipine (NORVASC) 10 MG tablet Take 1 tablet (10 mg total) by mouth daily. 90 tablet 3   benzonatate (TESSALON) 100 MG capsule TAKE 1 CAPSULE(100 MG) BY MOUTH TWICE DAILY AS NEEDED FOR COUGH 30 capsule 0   cetirizine (ZYRTEC) 10 MG tablet TAKE 1 TABLET(10 MG) BY MOUTH DAILY 90 tablet 1   cyclobenzaprine (FLEXERIL) 10 MG tablet Take 10 mg by mouth 3 (three) times daily.     diclofenac sodium (VOLTAREN) 1 % GEL Apply 2 g topically 4 (four) times daily. 100 g 0   EPINEPHrine (EPIPEN 2-PAK) 0.3 mg/0.3 mL IJ SOAJ injection Inject 0.3 mLs (0.3 mg total) into the muscle  as needed for anaphylaxis. 1 each 0   fluticasone (FLONASE) 50 MCG/ACT nasal spray Place 1 spray into both nostrils daily. 16 g 3   fluticasone furoate-vilanterol (BREO ELLIPTA) 100-25 MCG/ACT AEPB Inhale 1 puff into the lungs daily. 1 each 5   gabapentin (NEURONTIN) 300 MG capsule Take 1 capsule (300 mg total) by mouth at bedtime. (Patient taking differently: Take 300 mg by mouth 2 (two) times daily.) 30 capsule 3   hydrOXYzine (VISTARIL) 25 MG capsule TAKE 1 CAPSULE(25 MG) BY MOUTH DAILY AS NEEDED FOR ANXIETY 90 capsule 0   ibuprofen (ADVIL) 600 MG tablet Take 1 tablet (600 mg total) by mouth every 8  (eight) hours as needed. 30 tablet 0   linaclotide (LINZESS) 290 MCG CAPS capsule TAKE 1 CAPSULE(290 MCG) BY MOUTH DAILY BEFORE BREAKFAST 90 capsule 1   meclizine (ANTIVERT) 25 MG tablet TAKE 1 TABLET(25 MG) BY MOUTH TWICE DAILY AS NEEDED FOR DIZZINESS 20 tablet 0   metFORMIN (GLUCOPHAGE) 500 MG tablet TAKE 1 TABLET(500 MG) BY MOUTH DAILY WITH BREAKFAST 90 tablet 1   montelukast (SINGULAIR) 10 MG tablet Take 1 tablet (10 mg total) by mouth at bedtime. 30 tablet 11   omeprazole (PRILOSEC) 20 MG capsule TAKE 1 CAPSULE(20 MG) BY MOUTH TWICE DAILY BEFORE A MEAL 180 capsule 3   oxycodone (OXY-IR) 5 MG capsule Take 5 mg by mouth every 8 (eight) hours as needed.     promethazine (PHENERGAN) 25 MG tablet TAKE 1 TABLET(25 MG) BY MOUTH EVERY 8 HOURS AS NEEDED FOR NAUSEA OR VOMITING 20 tablet 0   rosuvastatin (CRESTOR) 20 MG tablet TAKE 1 TABLET(20 MG) BY MOUTH DAILY 30 tablet 3   triamcinolone cream (KENALOG) 0.1 % Apply 1 Application topically 2 (two) times daily. 30 g 0   No current facility-administered medications on file prior to visit.    Allergies  Allergen Reactions   Acetaminophen Itching and Rash    Other Reaction(s): Not available   Ampicillin Anaphylaxis   Atorvastatin Calcium Other (See Comments)    Pt reported lip swelling   Penicillins Anaphylaxis    Other Reaction(s): Not available   Sulfonamide Derivatives Anaphylaxis   Tramadol Itching and Rash   Hydrocodone-Acetaminophen Hives    Have to take a Benadryl to take med   Hydrocodone-Acetaminophen Hives and Rash    Have to take a Benadryl to take med   Amoxicillin     Other Reaction(s): Not available    Social History   Socioeconomic History   Marital status: Single    Spouse name: Not on file   Number of children: 2   Years of education: 10   Highest education level: Not on file  Occupational History   Occupation: N/A  Tobacco Use   Smoking status: Never   Smokeless tobacco: Never  Vaping Use   Vaping status: Never  Used  Substance and Sexual Activity   Alcohol use: Never   Drug use: No   Sexual activity: Yes  Other Topics Concern   Not on file  Social History Narrative   Lives at home w/ her mother   Right-handed   Caffeine: 1 cup of tea per day   Social Determinants of Health   Financial Resource Strain: Low Risk  (04/19/2023)   [facility]   Overall Financial Resource Strain (CARDIA)    Difficulty of Paying Living Expenses: Not hard at all  Food Insecurity: Food Insecurity Present (08/23/2023)   Hunger Vital Sign  Worried About Programme researcher, broadcasting/film/video in the Last Year: Never true    Ran Out of Food in the Last Year: Sometimes true  Transportation Needs: No Transportation Needs (08/23/2023)   PRAPARE - Administrator, Civil Service (Medical): No    Lack of Transportation (Non-Medical): No  Physical Activity: Insufficiently Active (11/09/2022)   Exercise Vital Sign    Days of Exercise per Week: 7 days    Minutes of Exercise per Session: 10 min  Stress: No Stress Concern Present (11/09/2022)   [employer] of Occupational Health - Occupational Stress Questionnaire    Feeling of Stress : Not at all  Social Connections: Socially Isolated (11/09/2022)   Social Connection and Isolation Panel [NHANES]    Frequency of Communication with Friends and Family: More than three times a week    Frequency of Social Gatherings with Friends and Family: More than three times a week    Attends Religious Services: Never    Database administrator or Organizations: No    Attends Banker Meetings: Never    Marital Status: Never married  Intimate Partner Violence: Not At Risk (08/23/2023)   Humiliation, Afraid, Rape, and Kick questionnaire    Fear of Current or Ex-Partner: No    Emotionally Abused: No    Physically Abused: No    Sexually Abused: No    Family History  Problem Relation Age of Onset   Diabetes Mother    Hypertension Mother    Heart  attack Father    Seizures Sister    Seizures Sister    Diabetes Brother    Hypertension Brother    Diabetes Maternal Aunt    Diabetes Maternal Uncle    Hypertension Maternal Uncle    Hypertension Son    Colon cancer Neg Hx    Stomach cancer Neg Hx     Past Surgical History:  Procedure Laterality Date   ANAL FISTULECTOMY  08/22/2012   Procedure: FISTULECTOMY ANAL;  Surgeon: Lodema Pilot, DO;  Location: [facility];  Service: General;  Laterality: N/A;  rectal examination under anesthesia with seton placement    ANAL FISTULECTOMY  09/17/2012   Procedure: FISTULECTOMY ANAL;  Surgeon: Lodema Pilot, DO;  Location: [facility];  Service: General;  Laterality: N/A;  possible fistulotomy   BACK SURGERY  10/19/2022   D & C HYSTEROSCOPY/ NOVASURE ENDOMETRIAL ABLATION/ I & D THROMBOSED HEMORROID  01-10-2007  DR Isaias Cowman ROSS   EXAMINATION UNDER ANESTHESIA  09/17/2012   Procedure: EXAM UNDER ANESTHESIA;  Surgeon: Lodema Pilot, DO;  [facility];  Service: General;  Laterality: N/A;  Rectal exam under anesthesia   EXAMINATION UNDER ANESTHESIA N/A 05/14/2013   Procedure: EXAM UNDER ANESTHESIA;  Surgeon: Romie Levee, MD;  Location[facility];  Service: General;  Laterality: N/A;   FISTULA PLUG  09/17/2012   Procedure: FISTULA PLUG;  Surgeon: Lodema Pilot, DO;  Location:  SURGERY CENTER;  Service: General;  Laterality: N/A;  Possible fistula plug   HEMORRHOID SURGERY  04/28/2009   PPH INTERNAL HEMORRHOIDS   IRRIGATION AND DEBRIDEMENT ABSCESS  07/08/2012   Procedure: MINOR INCISION AND DRAINAGE OF ABSCESS;  Surgeon: Geryl Rankins, MD;  [facility];  Service: Gynecology;  Laterality: N/A;  Vulvar Abscess   PLACEMENT OF SETON N/A 05/14/2013   Procedure:  PLACEMENT OF SETON;  Surgeon: Romie Levee, MD;  Location[facility];  Service: General;  Laterality: N/A;  RECTAL EXAM UNDER ANESTHESIA N/A  08/20/2013   Procedure: mucosal advancement flap ;  Surgeon: Romie Levee, MD;  Location: [facility];  Service: General;  Laterality: N/A;  parks anal retractor long rectal instrucments prone jack knife anal fistula    RECTAL ULTRASOUND N/A 07/20/2013   Procedure: RECTAL ULTRASOUND;  Surgeon: Romie Levee, MD;  [facility];  Service: Endoscopy;  Laterality: N/A;    ROS: Review of Systems Negative except as stated above  PHYSICAL EXAM: BP 117/79 (BP Location: Left Arm, Patient Position: Sitting, Cuff Size: Normal)   Pulse 75   Temp 98.2 F (36.8 C) (Oral)   Ht 5\' 4"  (1.626 m)   Wt 147 lb (66.7 kg)   SpO2 100%   BMI 25.23 kg/m   Wt Readings from Last 3 Encounters:  08/23/23 147 lb (66.7 kg)  07/18/23 146 lb 6.4 oz (66.4 kg)  07/11/23 149 lb (67.6 kg)  Sitting: BP 124/77, pulse 70 Standing: BP 130/84, pulse 73  Physical Exam  General appearance - alert, well appearing, and in no distress Mental status - normal mood, behavior, speech, dress, motor activity, and thought processes Mouth - mucous membranes moist, pharynx normal without lesions Chest - clear to auscultation, no wheezes, rales or rhonchi, symmetric air entry Heart - normal rate, regular rhythm, normal S1, S2, no murmurs, rubs, clicks or gallops Neurological - cranial nerves II through XII intact, normal muscle tone, no tremors, strength 5/5 Musculoskeletal -slight tenderness on palpation of the right lumbar paraspinal muscles.  No tenderness on palpation of the lumbar spine.  Straight leg raise is negative.  Gait is normal. Extremities - peripheral pulses normal, no pedal edema, no clubbing or cyanosis      Latest Ref Rng & Units 07/11/2023   12:30 PM 06/11/2023    2:53 PM 05/03/2023    8:21 AM  CMP  Glucose 70 - 99 mg/dL 98   161   BUN 6 - 24 mg/dL 10   9   Creatinine 0.96 - 1.00 mg/dL 0.45   4.09   Sodium 811 - 144 mmol/L 144   142   Potassium 3.5 - 5.2 mmol/L 4.5   4.3   Chloride 96 - 106 mmol/L 104    102   CO2 20 - 29 mmol/L 26   27   Calcium 8.7 - 10.2 mg/dL 9.6   9.5   Total Protein 6.0 - 8.5 g/dL 7.0  7.6  7.0   Total Bilirubin 0.0 - 1.2 mg/dL 0.2  0.4  0.6   Alkaline Phos 44 - 121 IU/L 71  59  118   AST 0 - 40 IU/L 24  40  381   ALT 0 - 32 IU/L 23  55  397    Lipid Panel     Component Value Date/Time   CHOL 170 02/12/2022 0937   TRIG 107 02/12/2022 0937   HDL 58 02/12/2022 0937   CHOLHDL 2.9 02/12/2022 0937   CHOLHDL 3.8 03/19/2009 0414   VLDL 13 03/19/2009 0414   LDLCALC 93 02/12/2022 0937    CBC    Component Value Date/Time   WBC 4.1 07/11/2023 1230   WBC 4.1 05/03/2023 0821   RBC 4.47 07/11/2023 1230   RBC 4.32 05/03/2023 0821   HGB 13.1 07/11/2023 1230   HGB 13.8 09/05/2009 1347   HCT 41.0 07/11/2023 1230   HCT 41.3 09/05/2009 1347   PLT 262 07/11/2023 1230   MCV 92 07/11/2023 1230   MCV 92.0 09/05/2009 1347  MCH 29.3 07/11/2023 1230   MCH 29.2 05/03/2023 0821   MCHC 32.0 07/11/2023 1230   MCHC 32.7 05/03/2023 0821   RDW 12.2 07/11/2023 1230   RDW 13.0 09/05/2009 1347   LYMPHSABS 1.9 07/11/2023 1230   LYMPHSABS 2.1 09/05/2009 1347   MONOABS 0.2 05/03/2023 0821   MONOABS 0.3 09/05/2009 1347   EOSABS 0.1 07/11/2023 1230   BASOSABS 0.1 07/11/2023 1230   BASOSABS 0.1 09/05/2009 1347    ASSESSMENT AND PLAN: 1. Type 2 diabetes mellitus with hyperlipidemia (HCC) At goal.  Continue metformin, healthy eating habits and trying to move as much as she can. - rosuvastatin (CRESTOR) 20 MG tablet; Take 1 tablet (20 mg total) by mouth daily.  Dispense: 90 tablet; Refill: 1 - metFORMIN (GLUCOPHAGE) 500 MG tablet; TAKE 1 TABLET(500 MG) BY MOUTH DAILY WITH BREAKFAST  Dispense: 90 tablet; Refill: 1  2. Diabetes mellitus treated with oral medication (HCC)   3. Hypertension associated with type 2 diabetes mellitus (HCC) Control.  Continue Norvasc - amLODipine (NORVASC) 10 MG tablet; Take 1 tablet (10 mg total) by mouth daily.  Dispense: 90 tablet; Refill: 1  4.  Dizziness Of questionable etiology.  If she feels that it is due to her allergies, advised that she take her Singulair and Zyrtec daily during periods when her allergies flareup.  Otherwise stay hydrated  5. Gastroesophageal reflux disease without esophagitis Continue omeprazole  6. Chronic constipation - linaclotide (LINZESS) 290 MCG CAPS capsule; TAKE 1 CAPSULE(290 MCG) BY MOUTH DAILY BEFORE BREAKFAST  Dispense: 90 capsule; Refill: 1  7. Lumbar radiculopathy Advised to avoid any heavy lifting, pushing or pulling.  Use a heating pad as needed.  Given Toradol shot today.  Refill given on ibuprofen.  Told to hold off on taking ibuprofen for at least 24 hours after the Toradol shot. - ibuprofen (ADVIL) 600 MG tablet; Take 1 tablet (600 mg total) by mouth every 8 (eight) hours as needed.  Dispense: 40 tablet; Refill: 1 - ketorolac (TORADOL) 30 MG/ML injection 30 mg    Patient was given the opportunity to ask questions.  Patient verbalized understanding of the plan and was able to repeat key elements of the plan.   This documentation was completed using Paediatric nurse.  Any transcriptional errors are unintentional.  No orders of the defined types were placed in this encounter.    Requested Prescriptions    No prescriptions requested or ordered in this encounter    No follow-ups on file.  Jonah Blue, MD, FACP

## 2023-08-23 NOTE — Patient Instructions (Signed)
Avoid any heavy lifting, pushing or pulling.  Use a heating pad as needed to the back.  Weight at least 24 hours after receiving a Toradol shot to take ibuprofen if needed.  Stay hydrated. Take your allergy medications daily during seasonal changes when allergy symptoms are more severe.  Please remember to call and schedule your mammogram.

## 2023-08-24 ENCOUNTER — Other Ambulatory Visit: Payer: Self-pay | Admitting: Internal Medicine

## 2023-08-24 DIAGNOSIS — F411 Generalized anxiety disorder: Secondary | ICD-10-CM

## 2023-08-26 ENCOUNTER — Encounter: Payer: Self-pay | Admitting: Pharmacist

## 2023-08-26 ENCOUNTER — Other Ambulatory Visit (HOSPITAL_BASED_OUTPATIENT_CLINIC_OR_DEPARTMENT_OTHER): Payer: 59 | Admitting: Pharmacist

## 2023-08-26 DIAGNOSIS — E7800 Pure hypercholesterolemia, unspecified: Secondary | ICD-10-CM

## 2023-08-26 NOTE — Progress Notes (Signed)
Pharmacy Quality Measure Review  This patient is appearing on a report for being at risk of failing the adherence measure for cholesterol (statin) medications this calendar year.   Medication: rosuvastatin Last fill date: 08/23/23 for 90 day supply  Insurance report was not up to date. No action needed at this time.   Butch Penny, PharmD, [place]y [place], CPP Clinical Pharmacist [facility] [phone]

## 2023-08-28 DIAGNOSIS — M5416 Radiculopathy, lumbar region: Secondary | ICD-10-CM | POA: Diagnosis not present

## 2023-08-28 DIAGNOSIS — Z9689 Presence of other specified functional implants: Secondary | ICD-10-CM | POA: Diagnosis not present

## 2023-08-28 DIAGNOSIS — M47816 Spondylosis without myelopathy or radiculopathy, lumbar region: Secondary | ICD-10-CM | POA: Diagnosis not present

## 2023-09-20 ENCOUNTER — Ambulatory Visit: Payer: 59

## 2023-09-24 ENCOUNTER — Ambulatory Visit
Admission: RE | Admit: 2023-09-24 | Discharge: 2023-09-24 | Disposition: A | Discharge status: Home / Self Care | Payer: 59 | Source: Ambulatory Visit | Attending: Internal Medicine | Admitting: Internal Medicine

## 2023-09-24 DIAGNOSIS — Z1231 Encounter for screening mammogram for malignant neoplasm of breast: Secondary | ICD-10-CM

## 2023-09-27 ENCOUNTER — Other Ambulatory Visit: Payer: Self-pay | Admitting: Internal Medicine

## 2023-09-27 DIAGNOSIS — R0982 Postnasal drip: Secondary | ICD-10-CM

## 2023-10-01 ENCOUNTER — Telehealth: Payer: Self-pay

## 2023-10-01 NOTE — Telephone Encounter (Signed)
-----   Message from [facility] Jakelin Taussig M sent at 09/30/2023  8:40 AM EST -----  ----- Message ----- From: Marcine Matar, MD Sent: 09/26/2023   9:33 PM EST To: Johna Roles, CMA  Mammogram is normal.  Will  be due again in 1 year.

## 2023-10-01 NOTE — Telephone Encounter (Signed)
Called patient unable to make contact or leave voicemail, information sent to the nurse pool

## 2023-10-23 DIAGNOSIS — Z9689 Presence of other specified functional implants: Secondary | ICD-10-CM | POA: Diagnosis not present

## 2023-10-23 DIAGNOSIS — M5416 Radiculopathy, lumbar region: Secondary | ICD-10-CM | POA: Diagnosis not present

## 2023-11-16 ENCOUNTER — Other Ambulatory Visit: Payer: Self-pay | Admitting: Internal Medicine

## 2023-11-16 DIAGNOSIS — F411 Generalized anxiety disorder: Secondary | ICD-10-CM

## 2023-11-23 ENCOUNTER — Other Ambulatory Visit: Payer: Self-pay | Admitting: Internal Medicine

## 2023-11-23 DIAGNOSIS — E1169 Type 2 diabetes mellitus with other specified complication: Secondary | ICD-10-CM

## 2023-11-25 NOTE — Telephone Encounter (Signed)
Request is too soon for refill, last refill 08/23/23 for 90 and 1 refill.E-Prescribing Status: Receipt confirmed by pharmacy (08/23/2023  2:21 PM EDT).  Requested Prescriptions  Pending Prescriptions Disp Refills   rosuvastatin (CRESTOR) 20 MG tablet [Pharmacy Med Name: ROSUVASTATIN 20MG  TABLETS] 30 tablet     Sig: TAKE 1 TABLET(20 MG) BY MOUTH DAILY     Cardiovascular:  Antilipid - Statins 2 Failed - 11/25/2023  9:24 AM      Failed - Lipid Panel in normal range within the last 12 months    Cholesterol, Total  Date Value Ref Range Status  02/12/2022 170 100 - 199 mg/dL Final   LDL Chol Calc (NIH)  Date Value Ref Range Status  02/12/2022 93 0 - 99 mg/dL Final   HDL  Date Value Ref Range Status  02/12/2022 58 >39 mg/dL Final   Triglycerides  Date Value Ref Range Status  02/12/2022 107 0 - 149 mg/dL Final         Passed - Cr in normal range and within 360 days    Creatinine, Ser  Date Value Ref Range Status  07/11/2023 0.74 0.57 - 1.00 mg/dL Final         Passed - Patient is not pregnant      Passed - Valid encounter within last 12 months    Recent Outpatient Visits           3 months ago Diabetes mellitus treated with oral medication (HCC)   [facility] [place]. [facility] Marcine Matar, MD   4 months ago Viral illness   [facility] Of [place]. [facility] Jonah Blue B, MD   6 months ago Right lower quadrant abdominal pain   [facility] [place] [facility] Dept Of [place]. [facility], Marylene Land M, [place]   8 months ago Dysuria   [facility] Of [place]. [facility] Jonah Blue B, MD   9 months ago Type 2 diabetes mellitus with hyperlipidemia [facility])   [place] [facility] - A Dept Of . [facility] Marcine Matar, MD       Future Appointments             In 2 months Laural Benes  Binnie Rail, MD [facility] [place] - A Dept Of [place]. [facility]

## 2023-12-11 NOTE — Telephone Encounter (Signed)
 Copied from CRM [id number]. Topic: Clinical - Medical Advice >> Dec 11, 2023  2:04 PM Yolanda T wrote: Reason for CRM: patient called in to make an acute appt but nothing before 2/20. She is coughing and experiencing chills. Please f/u with patient

## 2023-12-19 ENCOUNTER — Telehealth: Payer: Self-pay | Admitting: Internal Medicine

## 2023-12-19 NOTE — Telephone Encounter (Signed)
On December 19, 2023, at 10:35 am, I called the patient to reschedule their appointment with Dr. Laural Benes on January 24, 2024 as Dr. Laural Benes will not be in the office that day. The patient did not answer, was unable to leave a voice mail. Patient appointment has been rescheduled.

## 2024-01-24 ENCOUNTER — Ambulatory Visit: Payer: 59 | Admitting: Internal Medicine

## 2024-02-11 ENCOUNTER — Other Ambulatory Visit: Payer: Self-pay | Admitting: Internal Medicine

## 2024-02-11 DIAGNOSIS — F411 Generalized anxiety disorder: Secondary | ICD-10-CM

## 2024-02-11 DIAGNOSIS — E1169 Type 2 diabetes mellitus with other specified complication: Secondary | ICD-10-CM

## 2024-02-13 ENCOUNTER — Ambulatory Visit: Payer: 59 | Admitting: Internal Medicine

## 2024-02-14 ENCOUNTER — Other Ambulatory Visit: Payer: Self-pay | Admitting: Internal Medicine

## 2024-02-14 DIAGNOSIS — E1159 Type 2 diabetes mellitus with other circulatory complications: Secondary | ICD-10-CM

## 2024-02-18 ENCOUNTER — Other Ambulatory Visit: Payer: Self-pay | Admitting: Internal Medicine

## 2024-02-18 DIAGNOSIS — I152 Hypertension secondary to endocrine disorders: Secondary | ICD-10-CM

## 2024-02-19 NOTE — Telephone Encounter (Signed)
 Rx refused by office- needs appointment, duplicate request Requested Prescriptions  Pending Prescriptions Disp Refills   amLODipine (NORVASC) 10 MG tablet [Pharmacy Med Name: AMLODIPINE BESYLATE 10MG  TABLETS] 90 tablet 1    Sig: TAKE 1 TABLET(10 MG) BY MOUTH DAILY     Cardiovascular: Calcium Channel Blockers 2 Passed - 02/19/2024 10:12 AM      Passed - Last BP in normal range    BP Readings from Last 1 Encounters:  08/23/23 117/79         Passed - Last Heart Rate in normal range    Pulse Readings from Last 1 Encounters:  08/23/23 75         Passed - Valid encounter within last 6 months    Recent Outpatient Visits           6 months ago Diabetes mellitus treated with oral medication (HCC)   [place] [place] - [facility] Dept Of [place]. [facility] Lawrance Presume, MD   7 months ago Viral illness   [facility] [facility] [place] - A Dept Of [place]. [facility] Concetta Dee B, MD   9 months ago Right lower quadrant abdominal pain   [facility] [facility] [place] - A Dept Of [place]. [facility], Shelvy Dickens M, [place]   11 months ago Dysuria   [facility] [facility] [place] - A Dept Of [place]. [facility] Lawrance Presume, MD   1 year ago Type 2 diabetes mellitus with hyperlipidemia [facility])   [place] [facility] [place] - A Dept Of [place]. [facility] Lawrance Presume, MD

## 2024-02-20 ENCOUNTER — Other Ambulatory Visit: Payer: Self-pay | Admitting: Internal Medicine

## 2024-02-20 DIAGNOSIS — E1169 Type 2 diabetes mellitus with other specified complication: Secondary | ICD-10-CM

## 2024-02-21 NOTE — Telephone Encounter (Signed)
 Requested Prescriptions  Refused Prescriptions Disp Refills   rosuvastatin  (CRESTOR ) 20 MG tablet [Pharmacy Med Name: ROSUVASTATIN  20MG  TABLETS] 90 tablet     Sig: TAKE 1 TABLET(20 MG) BY MOUTH DAILY     Cardiovascular:  Antilipid - Statins 2 Failed - 02/21/2024 11:52 AM      Failed - Lipid Panel in normal range within the last 12 months    Cholesterol, Total  Date Value Ref Range Status  02/12/2022 170 100 - 199 mg/dL Final   LDL Chol Calc (NIH)  Date Value Ref Range Status  02/12/2022 93 0 - 99 mg/dL Final   HDL  Date Value Ref Range Status  02/12/2022 58 >39 mg/dL Final   Triglycerides  Date Value Ref Range Status  02/12/2022 107 0 - 149 mg/dL Final         Passed - Cr in normal range and within 360 days    Creatinine, Ser  Date Value Ref Range Status  07/11/2023 0.74 0.57 - 1.00 mg/dL Final         Passed - Patient is not pregnant      Passed - Valid encounter within last 12 months    Recent Outpatient Visits           6 months ago Diabetes mellitus treated with oral medication (HCC)   [facility] [place]. [facility] Lawrance Presume, [place]   7 months ago Viral illness   [facility] Dept Of [place]. [facility] Concetta Dee B, MD   9 months ago Right lower quadrant abdominal pain   [facility] Dept Of [place]. [facility], Shelvy Dickens M, [place]   11 months ago Dysuria   [facility] Dept Of [place]. [facility] Lawrance Presume, MD   1 year ago Type 2 diabetes mellitus with hyperlipidemia [facility])   [facility] - [facility] Dept Of [place]. [facility] Lawrance Presume, [place]

## 2024-02-25 ENCOUNTER — Other Ambulatory Visit: Payer: Self-pay | Admitting: Nurse Practitioner

## 2024-02-25 ENCOUNTER — Encounter: Payer: Self-pay | Admitting: Nurse Practitioner

## 2024-02-25 ENCOUNTER — Ambulatory Visit: Attending: Nurse Practitioner | Admitting: Nurse Practitioner

## 2024-02-25 ENCOUNTER — Other Ambulatory Visit (HOSPITAL_COMMUNITY)
Admission: RE | Admit: 2024-02-25 | Discharge: 2024-02-25 | Disposition: A | Discharge status: Home / Self Care | Source: Ambulatory Visit | Attending: Nurse Practitioner | Admitting: Nurse Practitioner

## 2024-02-25 VITALS — BP 112/74 | HR 71 | Resp 18 | Ht 64.0 in | Wt 152.8 lb

## 2024-02-25 DIAGNOSIS — I152 Hypertension secondary to endocrine disorders: Secondary | ICD-10-CM

## 2024-02-25 DIAGNOSIS — N760 Acute vaginitis: Secondary | ICD-10-CM | POA: Diagnosis present

## 2024-02-25 DIAGNOSIS — Z7984 Long term (current) use of oral hypoglycemic drugs: Secondary | ICD-10-CM | POA: Diagnosis not present

## 2024-02-25 DIAGNOSIS — J3089 Other allergic rhinitis: Secondary | ICD-10-CM

## 2024-02-25 DIAGNOSIS — R21 Rash and other nonspecific skin eruption: Secondary | ICD-10-CM | POA: Diagnosis not present

## 2024-02-25 DIAGNOSIS — I10 Essential (primary) hypertension: Secondary | ICD-10-CM

## 2024-02-25 DIAGNOSIS — E1169 Type 2 diabetes mellitus with other specified complication: Secondary | ICD-10-CM

## 2024-02-25 DIAGNOSIS — E119 Type 2 diabetes mellitus without complications: Secondary | ICD-10-CM | POA: Diagnosis not present

## 2024-02-25 DIAGNOSIS — R42 Dizziness and giddiness: Secondary | ICD-10-CM

## 2024-02-25 MED ORDER — TRIAMCINOLONE ACETONIDE 0.1 % EX CREA: 1.0000 | TOPICAL_CREAM | Freq: Two times a day (BID) | CUTANEOUS | 1 refills | Status: DC

## 2024-02-25 MED ORDER — METFORMIN HCL 500 MG PO TABS: 500.0000 mg | ORAL_TABLET | Freq: Every day | ORAL | 1 refills | Status: DC

## 2024-02-25 MED ORDER — MECLIZINE HCL 25 MG PO TABS: ORAL_TABLET | ORAL | 1 refills | Status: AC

## 2024-02-25 MED ORDER — FLUTICASONE PROPIONATE 50 MCG/ACT NA SUSP: 1.0000 | Freq: Every day | NASAL | 3 refills | Status: DC

## 2024-02-25 MED ORDER — AMLODIPINE BESYLATE 10 MG PO TABS: 10.0000 mg | ORAL_TABLET | Freq: Every day | ORAL | 1 refills | Status: DC

## 2024-02-25 MED ORDER — ROSUVASTATIN CALCIUM 20 MG PO TABS: 20.0000 mg | ORAL_TABLET | Freq: Every day | ORAL | 1 refills | Status: DC

## 2024-02-25 MED ORDER — CETIRIZINE HCL 10 MG PO TABS: 10.0000 mg | ORAL_TABLET | Freq: Every day | ORAL | 1 refills | Status: DC

## 2024-02-25 NOTE — Progress Notes (Signed)
 Assessment & Plan:  [name] was seen today for hypertension and vaginal itching.  Diagnoses and all orders for this visit:  Essential hypertension Amlodipine  10 mg every day Recommend: DASH/Mediterranean diet is a better choice for HTN. Exercise 3-5 times per week by walking at least 30 minutes per day. Avoid exercises that may cause injury to your back  Continue amlodipine  daily as prescribed.   Type 2 diabetes mellitus without complication, without long-term current use of insulin  Follow up with PCP for A1c -     CBC with Differential -     CMP14+EGFR Check blood sugars at home  Rash of both hands No current rash but requests to have medication on hand -     triamcinolone  cream (KENALOG ) 0.1 %; Apply 1 Application topically 2 (two) times daily.  Acute vaginitis -     Cervicovaginal ancillary only Avoid products that can irritate the vaginal area such as harsh soaps and detergents  Dizziness Currently resolved. -     meclizine  (ANTIVERT ) 25 MG tablet; TAKE 1 TABLET(25 MG) BY MOUTH TWICE DAILY AS NEEDED FOR DIZZINESS    Patient has been counseled on age-appropriate routine health concerns for screening and prevention. These are reviewed and up-to-date. Referrals have been placed accordingly. Immunizations are up-to-date or declined.     Subjective:   Chief Complaint  Patient presents with   Hypertension   Vaginal Itching    Started 02/25/24    [name] 58 y.o. female presents to office today for routine follow HTN and type 2 diabetes.  She is a patient of Dr. Lincoln Renshaw.   Reports vaginal itching. Denies vaginal discharge, genital sores or rash. Completed cervicovaginal swab to determine cause of vaginal itching. Educated patient that certain detergents and soaps can be an irritant.    Hypertension Pertinent negatives include no chest pain, malaise/fatigue or shortness of breath.  Vaginal Itching Pertinent negatives include no dysuria, frequency, rash or urgency.    BP Readings from Last 3 Encounters:  02/25/24 112/74  08/23/23 117/79  07/18/23 110/70     Review of Systems  Constitutional: Negative.  Negative for malaise/fatigue.  HENT: Negative.    Eyes: Negative.   Respiratory:  Negative for shortness of breath.   Cardiovascular:  Negative for chest pain.  Gastrointestinal: Negative.   Genitourinary:  Negative for dysuria, frequency and urgency.       Vaginal itching  Musculoskeletal: Negative.   Skin:  Positive for itching. Negative for rash.  Neurological: Negative.   Endo/Heme/Allergies: Negative.   Psychiatric/Behavioral: Negative.     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 Claritin    Vertigo     Past Surgical History:  Procedure Laterality Date   ANAL FISTULECTOMY  08/22/2012   Procedure: FISTULECTOMY ANAL;  Surgeon: Evander Hills, DO;  Location: [facility];  Service: General;  Laterality: N/A;  rectal examination under anesthesia with seton placement    ANAL FISTULECTOMY  09/17/2012   Procedure: FISTULECTOMY ANAL;  Surgeon: Evander Hills, DO;  Location: [facility];  Service: General;  Laterality: N/A;  possible fistulotomy   BACK SURGERY  10/19/2022   D & C HYSTEROSCOPY/ NOVASURE ENDOMETRIAL ABLATION/ I & D THROMBOSED HEMORROID  01-10-2007  DR Leilani Punter ROSS   EXAMINATION UNDER ANESTHESIA  09/17/2012   Procedure: EXAM UNDER ANESTHESIA;  Surgeon: Evander Hills, DO;  Location: [facility];  Service: General;  Laterality: N/A;  Rectal exam under anesthesia   EXAMINATION UNDER ANESTHESIA N/A 05/14/2013   Procedure: EXAM UNDER ANESTHESIA;  Surgeon: Joyce Nixon, MD;  Location: [facility];  Service: General;  Laterality: N/A;   FISTULA PLUG  09/17/2012   Procedure: FISTULA PLUG;  Surgeon: Evander Hills, DO;   Location: [facility];  Service: General;  Laterality: N/A;  Possible fistula plug   HEMORRHOID SURGERY  04/28/2009   [facility] INTERNAL HEMORRHOIDS   IRRIGATION AND DEBRIDEMENT ABSCESS  07/08/2012   Procedure: MINOR INCISION AND DRAINAGE OF ABSCESS;  Surgeon: Johnn Najjar, MD;  Location: [facility];  Service: Gynecology;  Laterality: N/A;  Vulvar Abscess   PLACEMENT OF SETON N/A 05/14/2013   Procedure:  PLACEMENT OF SETON;  Surgeon: Joyce Nixon, MD;  Location: [facility];  Service: General;  Laterality: N/A;   RECTAL EXAM UNDER ANESTHESIA N/A 08/20/2013   Procedure: mucosal advancement flap ;  Surgeon: Joyce Nixon, MD;  Location: [facility];  Service: General;  Laterality: N/A;  parks anal retractor long rectal instrucments prone jack knife anal fistula    RECTAL ULTRASOUND N/A 07/20/2013   Procedure: RECTAL ULTRASOUND;  Surgeon: Joyce Nixon, MD;  Location: [facility];  Service: Endoscopy;  Laterality: N/A;    Family History  Problem Relation Age of Onset   Diabetes Mother    Hypertension Mother    Heart attack Father    Seizures Sister    Seizures Sister    Diabetes Brother    Hypertension Brother    Diabetes Maternal Aunt    Diabetes Maternal Uncle    Hypertension Maternal Uncle    Hypertension Son    Colon cancer Neg Hx    Stomach cancer Neg Hx     Social History Reviewed with no changes to be made today.   Outpatient Medications Prior to Visit  Medication Sig Dispense Refill   albuterol  (VENTOLIN  HFA) 108 (90 Base) MCG/ACT inhaler Inhale 2 puffs into the lungs every 6 (six) hours as needed for wheezing or shortness of breath. 6.7 g 0   cyclobenzaprine  (FLEXERIL ) 10 MG tablet Take 10 mg by mouth 3 (three) times daily.     diclofenac  sodium (VOLTAREN ) 1 % GEL Apply 2 g topically 4 (four) times daily. 100 g 0   EPINEPHrine  (EPIPEN  2-PAK) 0.3 mg/0.3 mL IJ SOAJ injection Inject 0.3 mLs (0.3 mg total) into the muscle as needed for  anaphylaxis. 1 each 0   fluticasone  furoate-vilanterol (BREO ELLIPTA ) 100-25 MCG/ACT AEPB Inhale 1 puff into the lungs daily. 1 each 5   gabapentin  (NEURONTIN ) 300 MG capsule Take 1 capsule (300 mg total) by mouth at bedtime. (Patient taking differently: Take 300 mg by mouth 2 (two) times daily.) 30 capsule 3   hydrOXYzine  (VISTARIL ) 25 MG capsule TAKE 1 CAPSULE(25 MG) BY MOUTH DAILY AS NEEDED FOR ANXIETY 30 capsule 0   ibuprofen  (ADVIL ) 600 MG tablet Take 1 tablet (600 mg total) by mouth every 8 (eight) hours as needed. 40 tablet 1   linaclotide  (LINZESS ) 290 MCG CAPS capsule TAKE 1 CAPSULE(290 MCG) BY MOUTH DAILY BEFORE BREAKFAST 90 capsule 1   montelukast  (SINGULAIR ) 10 MG tablet Take 1 tablet (10 mg total) by mouth at bedtime. 30 tablet 11   omeprazole  (PRILOSEC) 20 MG capsule Take 1 capsule (20 mg total) by mouth 2 (two) times daily  before a meal. 180 capsule 3   amLODipine  (NORVASC ) 10 MG tablet Take 1 tablet (10 mg total) by mouth daily. 90 tablet 1   benzonatate  (TESSALON ) 100 MG capsule TAKE 1 CAPSULE(100 MG) BY MOUTH TWICE DAILY AS NEEDED FOR COUGH 30 capsule 0   cetirizine  (ZYRTEC ) 10 MG tablet TAKE 1 TABLET(10 MG) BY MOUTH DAILY 90 tablet 1   fluticasone  (FLONASE ) 50 MCG/ACT nasal spray Place 1 spray into both nostrils daily. 16 g 3   meclizine  (ANTIVERT ) 25 MG tablet TAKE 1 TABLET(25 MG) BY MOUTH TWICE DAILY AS NEEDED FOR DIZZINESS 20 tablet 0   metFORMIN  (GLUCOPHAGE ) 500 MG tablet TAKE 1 TABLET(500 MG) BY MOUTH DAILY WITH BREAKFAST 30 tablet 0   oxycodone  (OXY-IR) 5 MG capsule Take 5 mg by mouth every 8 (eight) hours as needed.     promethazine  (PHENERGAN ) 25 MG tablet TAKE 1 TABLET(25 MG) BY MOUTH EVERY 8 HOURS AS NEEDED FOR NAUSEA OR VOMITING 20 tablet 0   rosuvastatin  (CRESTOR ) 20 MG tablet Take 1 tablet (20 mg total) by mouth daily. Must have office visit for refills 30 tablet 0   triamcinolone  cream (KENALOG ) 0.1 % Apply 1 Application topically 2 (two) times daily. 30 g 0   No  facility-administered medications prior to visit.    Allergies  Allergen Reactions   Acetaminophen  Itching and Rash    Other Reaction(s): Not available   Ampicillin Anaphylaxis   Atorvastatin  Calcium  Other (See Comments)    Pt reported lip swelling   Penicillins Anaphylaxis    Other Reaction(s): Not available   Sulfonamide Derivatives Anaphylaxis   Tramadol Itching and Rash   Hydrocodone -Acetaminophen  Hives    Have to take a Benadryl  to take med   Hydrocodone -Acetaminophen  Hives and Rash    Have to take a Benadryl  to take med   Amoxicillin     Other Reaction(s): Not available       Objective:    BP 112/74 (BP Location: Left Arm, Patient Position: Sitting, Cuff Size: Normal)   Pulse 71   Resp 18   Ht 5\' 4"  (1.626 m)   Wt 152 lb 12.8 oz (69.3 kg)   SpO2 99%   BMI 26.23 kg/m  Wt Readings from Last 3 Encounters:  02/25/24 152 lb 12.8 oz (69.3 kg)  08/23/23 147 lb (66.7 kg)  07/18/23 146 lb 6.4 oz (66.4 kg)    Physical Exam Vitals and nursing note reviewed.  Constitutional:      Appearance: Normal appearance.  HENT:     Head: Normocephalic.  Cardiovascular:     Rate and Rhythm: Normal rate and regular rhythm.     Heart sounds: Normal heart sounds.  Pulmonary:     Effort: Pulmonary effort is normal.     Breath sounds: Normal breath sounds.  Musculoskeletal:        General: Normal range of motion.     Cervical back: Normal range of motion.  Skin:    General: Skin is warm and dry.  Neurological:     General: No focal deficit present.     Mental Status: She is alert and oriented to person, place, and time.  Psychiatric:        Attention and Perception: Attention normal.        Mood and Affect: Mood normal.        Speech: Speech normal.        Behavior: Behavior normal. Behavior is cooperative.        Thought Content: Thought content  normal.        Cognition and Memory: Cognition normal.        Judgment: Judgment normal.        Patient has been counseled  extensively about nutrition and exercise as well as the importance of adherence with medications and regular follow-up. The patient was given clear instructions to go to ER or return to medical center if symptoms don't improve, worsen or new problems develop. The patient verbalized understanding.   Follow-up: Return in about 3 months (around 05/26/2024), or if symptoms worsen or fail to improve, for f/u with PCP.   Rual Vermeer E Bernie Fobes, BSN RN, student FNP [facility] [place], [place] [phone]   02/25/2024, 12:01 PM

## 2024-02-25 NOTE — Progress Notes (Signed)
 I have seen and examined this patient with the advanced practice provider STUDENT and agree with the note below

## 2024-02-26 LAB — CERVICOVAGINAL ANCILLARY ONLY
Bacterial Vaginitis (gardnerella): NEGATIVE
Candida Glabrata: NEGATIVE
Candida Vaginitis: NEGATIVE
Chlamydia: NEGATIVE
Comment: NEGATIVE
Comment: NEGATIVE
Comment: NEGATIVE
Comment: NEGATIVE
Comment: NEGATIVE
Comment: NORMAL
Neisseria Gonorrhea: NEGATIVE
Trichomonas: NEGATIVE

## 2024-02-26 LAB — CMP14+EGFR
ALT: 18 IU/L (ref 0–32)
AST: 20 IU/L (ref 0–40)
Albumin: 4.5 g/dL (ref 3.8–4.9)
Alkaline Phosphatase: 64 IU/L (ref 44–121)
BUN/Creatinine Ratio: 11 (ref 9–23)
BUN: 8 mg/dL (ref 6–24)
Bilirubin Total: 0.2 mg/dL (ref 0.0–1.2)
CO2: 24 mmol/L (ref 20–29)
Calcium: 9.4 mg/dL (ref 8.7–10.2)
Chloride: 101 mmol/L (ref 96–106)
Creatinine, Ser: 0.76 mg/dL (ref 0.57–1.00)
Globulin, Total: 2.6 g/dL (ref 1.5–4.5)
Glucose: 91 mg/dL (ref 70–99)
Potassium: 4.2 mmol/L (ref 3.5–5.2)
Sodium: 141 mmol/L (ref 134–144)
Total Protein: 7.1 g/dL (ref 6.0–8.5)
eGFR: 90 mL/min/{1.73_m2} (ref >59)

## 2024-02-26 LAB — CBC WITH DIFFERENTIAL/PLATELET
Basophils Absolute: 0.1 10*3/uL (ref 0.0–0.2)
Basos: 1 %
EOS (ABSOLUTE): 0.1 10*3/uL (ref 0.0–0.4)
Eos: 2 %
Hematocrit: 38.2 % (ref 34.0–46.6)
Hemoglobin: 12.8 g/dL (ref 11.1–15.9)
Immature Grans (Abs): 0 10*3/uL (ref 0.0–0.1)
Immature Granulocytes: 0 %
Lymphocytes Absolute: 2.3 10*3/uL (ref 0.7–3.1)
Lymphs: 34 %
MCH: 30 pg (ref 26.6–33.0)
MCHC: 33.5 g/dL (ref 31.5–35.7)
MCV: 90 fL (ref 79–97)
Monocytes Absolute: 0.4 10*3/uL (ref 0.1–0.9)
Monocytes: 6 %
Neutrophils Absolute: 3.9 10*3/uL (ref 1.4–7.0)
Neutrophils: 57 %
Platelets: 245 10*3/uL (ref 150–450)
RBC: 4.26 x10E6/uL (ref 3.77–5.28)
RDW: 12.9 % (ref 11.7–15.4)
WBC: 6.7 10*3/uL (ref 3.4–10.8)

## 2024-02-28 ENCOUNTER — Ambulatory Visit: Payer: Self-pay

## 2024-02-28 NOTE — Telephone Encounter (Signed)
 Noted.

## 2024-02-28 NOTE — Telephone Encounter (Signed)
 Copied from CRM [id number]. Topic: Clinical - Red Word Triage >> Feb 28, 2024  2:42 PM Phil Braun wrote: Red Word that prompted transfer to Nurse Triage:   Pt did swab test and it was negative. However, she is having fullness of the bladder, pressure like she can't empty enough and pain. Burning sensation when she does urinate.   Chief Complaint: Urinary pain Symptoms: Burning with urination, bladder fullness  Frequency: Every urination  Pertinent Negatives: Patient denies fever Disposition: [] ED /[x] Urgent Care (no appt availability in office) / [] Appointment(In office/virtual)/ []  [facility]/ [] Home Care/ [] Refused Recommended Disposition /[] [facility]/ []  Follow-up with PCP Additional Notes: Patient reports that she has been experiencing burning with urination since she was seen in the office 4 days ago. She states that the burning is present with each urination and states she has had two episodes of it today. She states she is also experiencing bladder fullness and a sensation that her bladder is not empty after urination. Patient advised that there are no available appointments in the office and that she should go to urgent care for evaluation.    Reason for Disposition  Age > 50 years  Answer Assessment - Initial Assessment Questions 1. SEVERITY: "How bad is the pain?"  (e.g., Scale 1-10; mild, moderate, or severe)   - MILD (1-3): complains slightly about urination hurting   - MODERATE (4-7): interferes with normal activities     - SEVERE (8-10): excruciating, unwilling or unable to urinate because of the pain      Mild  2. FREQUENCY: "How many times have you had painful urination today?"      Twice  3. PATTERN: "Is pain present every time you urinate or just sometimes?"      Every urination  4. ONSET: "When did the painful urination start?"      4 days ago  5. FEVER: "Do you have a fever?" If Yes, ask: "What is your temperature, how was it measured, and when  did it start?"     No 6. PAST UTI: "Have you had a urine infection before?" If Yes, ask: "When was the last time?" and "What happened that time?"      Yes 7. CAUSE: "What do you think is causing the painful urination?"  (e.g., UTI, scratch, Herpes sore)     UTI 8. OTHER SYMPTOMS: "Do you have any other symptoms?" (e.g., blood in urine, flank pain, genital sores, urgency, vaginal discharge)     Bladder pressure  Protocols used: Urination Pain - Female-A-AH

## 2024-02-29 ENCOUNTER — Other Ambulatory Visit: Payer: Self-pay

## 2024-02-29 ENCOUNTER — Encounter (HOSPITAL_COMMUNITY): Payer: Self-pay | Admitting: *Deleted

## 2024-02-29 ENCOUNTER — Ambulatory Visit (HOSPITAL_COMMUNITY)
Admission: EM | Admit: 2024-02-29 | Discharge: 2024-02-29 | Disposition: A | Discharge status: Home / Self Care | Attending: Emergency Medicine | Admitting: Emergency Medicine

## 2024-02-29 DIAGNOSIS — N3000 Acute cystitis without hematuria: Secondary | ICD-10-CM

## 2024-02-29 LAB — POCT URINALYSIS DIP (MANUAL ENTRY)
Glucose, UA: 250 mg/dL — AB
Nitrite, UA: POSITIVE — AB
Protein Ur, POC: 100 mg/dL — AB
Spec Grav, UA: <=1.005 — AB (ref 1.010–1.025)
Urobilinogen, UA: 4 U/dL — AB
pH, UA: 5 (ref 5.0–8.0)

## 2024-02-29 MED ORDER — NITROFURANTOIN MONOHYD MACRO 100 MG PO CAPS: 100.0000 mg | ORAL_CAPSULE | Freq: Two times a day (BID) | ORAL | 0 refills | Status: AC

## 2024-02-29 NOTE — ED Triage Notes (Signed)
 PT reports since Monday she has had dysuria and urinary frequency. . Pt has tried AZO with out relief.

## 2024-02-29 NOTE — ED Provider Notes (Signed)
 [facility]    CSN: [id number] Arrival date & time: 02/29/24  1001      History   Chief Complaint Chief Complaint  Patient presents with   Dysuria   Urinary Urgency    HPI [name] is a 58 y.o. female. Patient with past medical history significant for type 2 diabetes presents to the urgent care today for concerns of dysuria and urgency. Ongoing for 5 days with no associated fever, chills, or bodyaches.  Denies any concerns for STIs as she states she is not sexually active.  Denies any vaginal discharge or drainage.  She reports that she has been trying Azo over-the-counter without improvement in symptoms.  No recent antibiotic therapy for the suspected UTI.   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 Claritin    Vertigo     Patient Active Problem List   Diagnosis Date Noted   Hyperlipidemia associated with type 2 diabetes mellitus (HCC) 02/15/2023   New onset type 2 diabetes mellitus (HCC) 02/14/2022   Altered consciousness 12/10/2018   Carpal tunnel syndrome of right wrist 06/12/2018   Postnasal drip 06/12/2018   Vertigo 10/18/2016   Disturbance of skin sensation 04/19/2014   Anal fistula 04/14/2013   UNSPECIFIED SITE OF SPRAIN AND STRAIN 07/07/2009   PEDAL EDEMA 06/29/2009   MUSCLE PAIN 06/23/2009   ANEMIA-IRON DEFICIENCY 03/11/2009   Allergic rhinitis 03/11/2009   SYNCOPE, VASOVAGAL 03/11/2009   HEADACHE 03/11/2009    Past Surgical History:  Procedure Laterality Date   ANAL FISTULECTOMY  08/22/2012   Procedure: FISTULECTOMY ANAL;  Surgeon: Evander Hills, DO;  [facility]: [facility];  Service: General;  Laterality: N/A;  rectal examination under anesthesia with seton placement    ANAL FISTULECTOMY  09/17/2012   Procedure: FISTULECTOMY ANAL;  Surgeon: Evander Hills,  DO;  Location: [facility];  Service: General;  Laterality: N/A;  possible fistulotomy   BACK SURGERY  10/19/2022   D & C HYSTEROSCOPY/ NOVASURE ENDOMETRIAL ABLATION/ I & D THROMBOSED HEMORROID  01-10-2007  DR Leilani Punter ROSS   EXAMINATION UNDER ANESTHESIA  09/17/2012   Procedure: EXAM UNDER ANESTHESIA;  Surgeon: Evander Hills, DO;  Location: [facility];  Service: General;  Laterality: N/A;  Rectal exam under anesthesia   EXAMINATION UNDER ANESTHESIA N/A 05/14/2013   Procedure: EXAM UNDER ANESTHESIA;  Surgeon: Joyce Nixon, MD;  Location: [facility];  Service: General;  Laterality: N/A;   FISTULA PLUG  09/17/2012   Procedure: FISTULA PLUG;  Surgeon: Evander Hills, DO;  Location: [facility];  Service: General;  Laterality: N/A;  Possible fistula plug   HEMORRHOID SURGERY  04/28/2009   PPH INTERNAL HEMORRHOIDS   IRRIGATION AND DEBRIDEMENT ABSCESS  07/08/2012   Procedure: MINOR INCISION AND DRAINAGE OF ABSCESS;  Surgeon: Johnn Najjar, MD;  Location[facility];  Service: Gynecology;  Laterality: N/A;  Vulvar Abscess   PLACEMENT OF SETON N/A 05/14/2013   Procedure:  PLACEMENT OF SETON;  Surgeon: Joyce Nixon, MD;  Location: [facility];  Service: General;  Laterality: N/A;   RECTAL EXAM UNDER ANESTHESIA N/A 08/20/2013   Procedure: mucosal advancement flap ;  Surgeon: Joyce Nixon, MD;  Location: [facility];  Service: General;  Laterality: N/A;  parks anal retractor long rectal instrucments prone jack knife anal fistula    RECTAL ULTRASOUND N/A 07/20/2013   Procedure: RECTAL ULTRASOUND;  Surgeon: Joyce Nixon, MD;  Location: [facility];  Service: Endoscopy;  Laterality: N/A;    OB History   No obstetric history on file.      Home Medications    Prior to Admission medications   Medication Sig Start Date End Date Taking? Authorizing Provider  albuterol  (VENTOLIN  HFA) 108 (90 Base) MCG/ACT inhaler Inhale 2  puffs into the lungs every 6 (six) hours as needed for wheezing or shortness of breath. 02/15/23   Lawrance Presume, MD  amLODipine  (NORVASC ) 10 MG tablet Take 1 tablet (10 mg total) by mouth daily. 02/25/24   Fleming, Zelda W, NP  cetirizine  (ZYRTEC ) 10 MG tablet Take 1 tablet (10 mg total) by mouth daily. 02/25/24   Fleming, Zelda W, NP  cyclobenzaprine  (FLEXERIL ) 10 MG tablet Take 10 mg by mouth 3 (three) times daily. 09/13/22   [provider]  diclofenac  sodium (VOLTAREN ) 1 % GEL Apply 2 g topically 4 (four) times daily. 04/01/19   Murray, Alyssa B, PA-C  EPINEPHrine  (EPIPEN  2-PAK) 0.3 mg/0.3 mL IJ SOAJ injection Inject 0.3 mLs (0.3 mg total) into the muscle as needed for anaphylaxis. 04/18/20   Hadassah Letters A, PA-C  fluticasone  (FLONASE ) 50 MCG/ACT nasal spray Place 1 spray into both nostrils daily. 02/25/24   Fleming, Zelda W, NP  fluticasone  furoate-vilanterol (BREO ELLIPTA ) 100-25 MCG/ACT AEPB Inhale 1 puff into the lungs daily. 07/18/23   Aleck Hurdle, MD  gabapentin  (NEURONTIN ) 300 MG capsule Take 1 capsule (300 mg total) by mouth at bedtime. Patient taking differently: Take 300 mg by mouth 2 (two) times daily. 07/05/20   Lawrance Presume, MD  hydrOXYzine  (VISTARIL ) 25 MG capsule TAKE 1 CAPSULE(25 MG) BY MOUTH DAILY AS NEEDED FOR ANXIETY 02/11/24   Lawrance Presume, MD  ibuprofen  (ADVIL ) 600 MG tablet Take 1 tablet (600 mg total) by mouth every 8 (eight) hours as needed. 08/23/23   Lawrance Presume, MD  linaclotide  (LINZESS ) 290 MCG CAPS capsule TAKE 1 CAPSULE(290 MCG) BY MOUTH DAILY BEFORE BREAKFAST 08/23/23   Lawrance Presume, MD  meclizine  (ANTIVERT ) 25 MG tablet TAKE 1 TABLET(25 MG) BY MOUTH TWICE DAILY AS NEEDED FOR DIZZINESS 02/25/24   Fleming, Zelda W, NP  metFORMIN  (GLUCOPHAGE ) 500 MG tablet Take 1 tablet (500 mg total) by mouth daily with breakfast. 02/25/24   Fleming, Zelda W, NP  montelukast  (SINGULAIR ) 10 MG tablet Take 1 tablet (10 mg total) by mouth at bedtime.  07/18/23   Desai, Nikita S, MD  nitrofurantoin, macrocrystal-monohydrate, (MACROBID) 100 MG capsule Take 1 capsule (100 mg total) by mouth 2 (two) times daily for 5 days. 02/29/24 03/05/24 Yes Jevan Gaunt A, PA-C  omeprazole  (PRILOSEC) 20 MG capsule Take 1 capsule (20 mg total) by mouth 2 (two) times daily before a meal. 08/23/23   Lawrance Presume, MD  rosuvastatin  (CRESTOR ) 20 MG tablet Take 1 tablet (20 mg total) by mouth daily. 02/25/24   Fleming, Zelda W, NP  triamcinolone  cream (KENALOG ) 0.1 % Apply 1 Application topically 2 (two) times daily. 02/25/24   Collins Dean, NP    Family History Family History  Problem Relation Age of Onset   Diabetes Mother    Hypertension Mother    Heart attack Father    Seizures Sister    Seizures Sister    Diabetes Brother    Hypertension Brother  Diabetes Maternal Aunt    Diabetes Maternal Uncle    Hypertension Maternal Uncle    Hypertension Son    Colon cancer Neg Hx    Stomach cancer Neg Hx     Social History Social History   Tobacco Use   Smoking status: Never   Smokeless tobacco: Never  Vaping Use   Vaping status: Never Used  Substance Use Topics   Alcohol use: Never   Drug use: No     Allergies   Acetaminophen , Ampicillin, Atorvastatin  calcium , Penicillins, Sulfonamide derivatives, Tramadol, Hydrocodone -acetaminophen , Hydrocodone -acetaminophen , and Amoxicillin   Review of Systems Review of Systems  Genitourinary:  Positive for dysuria.  All other systems reviewed and are negative.    Physical Exam Triage Vital Signs ED Triage Vitals  Encounter Vitals Group     BP 02/29/24 1017 121/77     Systolic BP Percentile --      Diastolic BP Percentile --      Pulse Rate 02/29/24 1017 68     Resp 02/29/24 1017 20     Temp 02/29/24 1017 (!) 97.5 F (36.4 C)     Temp src --      SpO2 02/29/24 1017 98 %     Weight --      Height --      Head Circumference --      Peak Flow --      Pain Score 02/29/24 1015 6     Pain  Loc --      Pain Education --      Exclude from Growth Chart --    No data found.  Updated Vital Signs BP 121/77   Pulse 68   Temp (!) 97.5 F (36.4 C)   Resp 20   SpO2 98%   Visual Acuity Right Eye Distance:   Left Eye Distance:   Bilateral Distance:    Right Eye Near:   Left Eye Near:    Bilateral Near:     Physical Exam Vitals and nursing note reviewed.  Constitutional:      General: She is not in acute distress.    Appearance: She is well-developed.  HENT:     Head: Normocephalic and atraumatic.  Eyes:     Conjunctiva/sclera: Conjunctivae normal.  Cardiovascular:     Rate and Rhythm: Normal rate and regular rhythm.     Heart sounds: No murmur heard. Pulmonary:     Effort: Pulmonary effort is normal. No respiratory distress.     Breath sounds: Normal breath sounds.  Abdominal:     General: Abdomen is flat. Bowel sounds are normal. There is no distension.     Palpations: Abdomen is soft.     Tenderness: There is no abdominal tenderness. There is no right CVA tenderness or left CVA tenderness.  Musculoskeletal:        General: No swelling.     Cervical back: Neck supple.  Skin:    General: Skin is warm and dry.     Capillary Refill: Capillary refill takes less than 2 seconds.  Neurological:     Mental Status: She is alert.  Psychiatric:        Mood and Affect: Mood normal.      UC Treatments / Results  Labs (all labs ordered are listed, but only abnormal results are displayed) Labs Reviewed  POCT URINALYSIS DIP (MANUAL ENTRY) - Abnormal; Notable for the following components:      Result Value   Color, UA orange (*)  Clarity, UA cloudy (*)    Glucose, UA =250 (*)    Bilirubin, UA small (*)    Ketones, POC UA trace (5) (*)    Spec Grav, UA <=1.005 (*)    Blood, UA small (*)    Protein Ur, POC =100 (*)    Urobilinogen, UA 4.0 (*)    Nitrite, UA Positive (*)    Leukocytes, UA Large (3+) (*)    All other components within normal limits     EKG   Radiology No results found.  Procedures Procedures (including critical care time)  Medications Ordered in UC Medications - No data to display  Initial Impression / Assessment and Plan / UC Course  I have reviewed the triage vital signs and the nursing notes.  Pertinent labs & imaging results that were available during my care of the patient were reviewed by me and considered in my medical decision making (see chart for details).     Patient with history of type 2 diabetes presents to the urgent care today with concerns of dysuria.  Reports has been ongoing for 5 days without improvement after using Azo.  No concerns for STIs.  Urine dipstick obtained here in urgent care for assessment of suspected UTI.  Given symptomatic recent Kate Pak, will likely require treatment.  Urine dipstick shows signs of infection with positive nitrates and large leukocytes seen.  Given patient's antibiotic allergy, will start patient on Macrobid 100 mg twice daily.  Advised patient to take this medication in its entirety.  Discussed return precautions.  Encourage close follow-up with primary care provider.  Otherwise stable at this time for outpatient follow-up with reassuring vitals and no acute change in clinical status here.  Final Clinical Impressions(s) / UC Diagnoses   Final diagnoses:  Acute cystitis without hematuria     Discharge Instructions      You are seen today for concerns of a possible UTI.  Your urine test does appear to be consistent with a UTI.  You are started on a antibiotic called Macrobid which you should take twice daily for the next 5 days.  Please plan on following up with your primary care provider for further evaluation.  For any concerns of new or worsening symptoms, return to the urgent care for evaluation.     ED Prescriptions     Medication Sig Dispense Auth. Provider   nitrofurantoin, macrocrystal-monohydrate, (MACROBID) 100 MG capsule Take 1 capsule (100  mg total) by mouth 2 (two) times daily for 5 days. 10 capsule Jalaysia Lobb A, PA-C      PDMP not reviewed this encounter.   Shermeka Rutt A, PA-C 02/29/24 1059

## 2024-02-29 NOTE — Discharge Instructions (Signed)
 You are seen today for concerns of a possible UTI.  Your urine test does appear to be consistent with a UTI.  You are started on a antibiotic called Macrobid which you should take twice daily for the next 5 days.  Please plan on following up with your primary care provider for further evaluation.  For any concerns of new or worsening symptoms, return to the urgent care for evaluation.

## 2024-03-03 ENCOUNTER — Telehealth: Payer: Self-pay

## 2024-03-03 ENCOUNTER — Ambulatory Visit: Payer: Self-pay

## 2024-03-03 NOTE — Telephone Encounter (Signed)
 Error

## 2024-03-03 NOTE — Telephone Encounter (Signed)
 Copied from CRM [id number]. Topic: Clinical - Lab/Test Results >> Mar 03, 2024 12:16 PM [name] wrote: Reason for CRM: Go over results per CAL   Patient returning a call to go over lab results. I advised her of the results. Patient had no further questions but did say she went to urgent care and is being treated for a UTI and wanted the office to be aware.    Reason for Disposition  Caller requesting routine or non-urgent lab result  Answer Assessment - Initial Assessment Questions 1. REASON FOR CALL or QUESTION: "What is your reason for calling today?" or "How can I best help you?" or "What question do you have that I can help answer?"     Lab results  2. CALLER: Document the source of call. (e.g., laboratory, patient).     Patient  Protocols used: PCP Call - No Triage-A-AH

## 2024-03-03 NOTE — Telephone Encounter (Signed)
 Noted.

## 2024-03-12 ENCOUNTER — Other Ambulatory Visit: Payer: Self-pay | Admitting: Internal Medicine

## 2024-03-12 DIAGNOSIS — F411 Generalized anxiety disorder: Secondary | ICD-10-CM

## 2024-04-09 ENCOUNTER — Other Ambulatory Visit: Payer: Self-pay | Admitting: Internal Medicine

## 2024-04-09 DIAGNOSIS — E1169 Type 2 diabetes mellitus with other specified complication: Secondary | ICD-10-CM

## 2024-04-09 NOTE — Telephone Encounter (Unsigned)
 Copied from CRM [id number]. Topic: Clinical - Medication Refill >> Apr 09, 2024  9:26 AM Bambi Bonine D wrote: Medication: metFORMIN  (GLUCOPHAGE ) 500 MG tablet  Has the patient contacted their pharmacy? Yes (Agent: If no, request that the patient contact the pharmacy for the refill. If patient does not wish to contact the pharmacy document the reason why and proceed with request.) (Agent: If yes, when and what did the pharmacy advise?)  This is the patient's preferred pharmacy:  [id number] - [place], [place] - [address] AT [facility] OF [address]  [place] [address] Phone: [phone] Fax: [phone]  Is this the correct pharmacy for this prescription? Yes If no, delete pharmacy and type the correct one.   Has the prescription been filled recently? No  Is the patient out of the medication? Yes  Has the patient been seen for an appointment in the last year OR does the patient have an upcoming appointment? Yes  Can we respond through MyChart? No  Agent: Please be advised that Rx refills may take up to 3 business days. We ask that you follow-up with your pharmacy.

## 2024-04-10 NOTE — Telephone Encounter (Signed)
 Call to pharmacy- they do have RF on file and will pull medication and contact patient for pickup.  Requested Prescriptions  Pending Prescriptions Disp Refills   metFORMIN  (GLUCOPHAGE ) 500 MG tablet 90 tablet 1    Sig: Take 1 tablet (500 mg total) by mouth daily with breakfast.     Endocrinology:  Diabetes - Biguanides Failed - 04/10/2024 11:13 AM      Failed - HBA1C is between 0 and 7.9 and within 180 days    HbA1c, POC (controlled diabetic range)  Date Value Ref Range Status  07/11/2023 5.7 0.0 - 7.0 % Final         Failed - B12 Level in normal range and within 720 days    Vitamin B-12  Date Value Ref Range Status  03/18/2009 496 211 - 911 pg/mL Final         Failed - Valid encounter within last 6 months    Recent Outpatient Visits           1 month ago Essential hypertension   [facility] [place]. [facility] - [facility] Collins Dean, NP   7 months ago Diabetes mellitus treated with oral medication [facility])   [facility] [place]. [facility] Lawrance Presume, MD   9 months ago Viral illness   [facility] Of [place]. [facility] Con[facility]etta Dee B, MD   10 months ago Right lower quadrant abdominal pain   [facility] [place]. [facility], Stan E[place], [place]   1 year ago Dysuria   [facility] Of [place]. [facility] Lawrance Presume, MD              Passed - Cr in normal range and within 360 days    Creatinine, Ser  Date Value Ref Range Status  02/25/2024 0.76 0.57 - 1.00 mg/dL Final         Passed - eGFR in normal range and within 360 days    GFR calc Af Amer  Date Value Ref Range Status  01/28/2020 90 >59 mL/min/1.73 Final   GFR, Estimated  Date Value Ref Range Status  05/03/2023 >60 >60 mL/min Final    Comment:    (NOTE) Calculated using the CKD-EPI  Creatinine Equation (2021)    GFR  Date Value Ref Range Status  03/01/2015 113.70 >60.00 mL/min Final   eGFR  Date Value Ref Range Status  02/25/2024 90 >59 mL/min/1.73 Final         Passed - CBC within normal limits and completed in the last 12 months    WBC  Date Value Ref Range Status  02/25/2024 6.7 3.4 - 10.8 x10E3/uL Final  05/03/2023 4.1 4.0 - 10.5 K/uL Final   RBC  Date Value Ref Range Status  02/25/2024 4.26 3.77 - 5.28 x10E6/uL Final  05/03/2023 4.32 3.87 - 5.11 MIL/uL Final   Hemoglobin  Date Value Ref Range Status  02/25/2024 12.8 11.1 - 15.9 g/dL Final   HGB  Date Value Ref Range Status  09/05/2009 13.8 11.6 - 15.9 g/dL Final   HCT  Date Value Ref Range Status  09/05/2009 41.3 34.8 - 46.6 % Final   Hematocrit  Date Value Ref Range Status  02/25/2024 38.2 34.0 - 46.6 % Final   MCHC  Date Value Ref Range Status  02/25/2024 33.5 31.5 - 35.7 g/dL Final  16/08/9603 54.0 30.0 - 36.0 g/dL Final   [facility]  Date Value Ref Range Status  02/25/2024 30.0 26.6 - 33.0 pg Final  05/03/2023 29.2 26.0 - 34.0 pg Final   MCV  Date Value Ref Range Status  02/25/2024 90 79 - 97 fL Final  09/05/2009 92.0 79.5 - 101.0 fL Final   No results found for: "PLTCOUNTKUC", "LABPLAT", "POCPLA" RDW  Date Value Ref Range Status  02/25/2024 12.9 11.7 - 15.4 % Final  09/05/2009 13.0 11.2 - 14.5 % Final

## 2024-04-10 NOTE — Telephone Encounter (Signed)
 Medication refilled 02/25/2024 #90 1 rf at same pharmacy that pt is requesting refill be sent. Need to call pharmacy to request refill be filled.

## 2024-04-11 ENCOUNTER — Other Ambulatory Visit: Payer: Self-pay | Admitting: Internal Medicine

## 2024-04-11 DIAGNOSIS — E1169 Type 2 diabetes mellitus with other specified complication: Secondary | ICD-10-CM

## 2024-04-15 ENCOUNTER — Telehealth: Payer: Self-pay | Admitting: Internal Medicine

## 2024-04-15 ENCOUNTER — Other Ambulatory Visit: Payer: Self-pay | Admitting: Internal Medicine

## 2024-04-15 ENCOUNTER — Ambulatory Visit: Payer: Self-pay

## 2024-04-15 DIAGNOSIS — R0982 Postnasal drip: Secondary | ICD-10-CM

## 2024-04-15 NOTE — Telephone Encounter (Signed)
 Copied from CRM [id number]. Topic: Clinical - Medication Refill >> Apr 15, 2024  9:45 AM Hamp Levine R wrote: Medication: benzonatate  (TESSALON  PERLES) 100 MG capsule  Has the patient contacted their pharmacy? Yes, call dr  This is the patient's preferred pharmacy:  [id number] - [place], [place] - [address] AT [facility] OF [address] & [address] [facility] [address] Phone: [phone] Fax: [phone]  Is this the correct pharmacy for this prescription? Yes If no, delete pharmacy and type the correct one.   Has the prescription been filled recently? No  Is the patient out of the medication? Yes  Has the patient been seen for an appointment in the last year OR does the patient have an upcoming appointment? Yes  Can we respond through MyChart? No  Agent: Please be advised that Rx refills may take up to 3 business days. We ask that you follow-up with your pharmacy.

## 2024-04-15 NOTE — Telephone Encounter (Signed)
 Noted

## 2024-04-15 NOTE — Telephone Encounter (Addendum)
 Requesting benzonatate  not on current med list. Attempted to call patient to triage symptoms, left voicemail to return call for triage.  Attempted to call patient to triage symptoms for Tesselon refill request. Unable to reach patient. Routing for follow up.

## 2024-04-15 NOTE — Telephone Encounter (Signed)
See NT encounter

## 2024-04-15 NOTE — Telephone Encounter (Signed)
 FYI Only or Action Required?: FYI only for provider  Patient was last seen in primary care on 02/25/2024 by Collins Dean, NP. Called Nurse Triage reporting Cough. Symptoms began yesterday. Interventions attempted: Prescription medications: flonanse. Symptoms are: gradually worsening.  Triage Disposition: See Physician Within 24 Hours  Patient/caregiver understands and will follow disposition?: Yes                      Reason for Disposition  SEVERE coughing spells (e.g., whooping sound after coughing, vomiting after coughing)  Answer Assessment - Initial Assessment Questions 1. ONSET: When did the cough begin?      Yesterday -- pt states she has had this cough before, just start coughing out of nowhere; pt requested refill of tessalon  pearles  2. SEVERITY: How bad is the cough today?      Takes a minute to stop coughing 3. SPUTUM: Describe the color of your sputum (none, dry cough; clear, white, yellow, green)     Clear/white 4. HEMOPTYSIS: Are you coughing up any blood? If so ask: How much? (flecks, streaks, tablespoons, etc.)     No  5. DIFFICULTY BREATHING: Are you having difficulty breathing? If Yes, ask: How bad is it? (e.g., mild, moderate, severe)    - MILD: No SOB at rest, mild SOB with walking, speaks normally in sentences, can lie down, no retractions, pulse < 100.    - MODERATE: SOB at rest, SOB with minimal exertion and prefers to sit, cannot lie down flat, speaks in phrases, mild retractions, audible wheezing, pulse 100-120.    - SEVERE: Very SOB at rest, speaks in single words, struggling to breathe, sitting hunched forward, retractions, pulse > 120      No  6. FEVER: Do you have a fever? If Yes, ask: What is your temperature, how was it measured, and when did it start?     No  7. CARDIAC HISTORY: Do you have any history of heart disease? (e.g., heart attack, congestive heart failure)      no 8. LUNG HISTORY: Do you have any  history of lung disease?  (e.g., pulmonary embolus, asthma, emphysema)     Rhinitis  9. PE RISK FACTORS: Do you have a history of blood clots? (or: recent major surgery, recent prolonged travel, bedridden)     No  10. OTHER SYMPTOMS: Do you have any other symptoms? (e.g., runny nose, wheezing, chest pain)       Unable to get up sputum, using Flonase  spray  Denies sore throat, body aches, H/A  Endorses runny nose, states it is not constant  Protocols used: Cough - Acute Non-Productive-A-AH

## 2024-04-16 ENCOUNTER — Ambulatory Visit: Attending: Internal Medicine | Admitting: Internal Medicine

## 2024-04-16 ENCOUNTER — Encounter: Payer: Self-pay | Admitting: Internal Medicine

## 2024-04-16 VITALS — BP 117/77 | HR 72 | Resp 18 | Ht 64.0 in | Wt 147.8 lb

## 2024-04-16 DIAGNOSIS — R051 Acute cough: Secondary | ICD-10-CM

## 2024-04-16 DIAGNOSIS — J302 Other seasonal allergic rhinitis: Secondary | ICD-10-CM | POA: Diagnosis not present

## 2024-04-16 MED ORDER — BENZONATATE 100 MG PO CAPS: 100.0000 mg | ORAL_CAPSULE | Freq: Three times a day (TID) | ORAL | 0 refills | Status: DC | PRN

## 2024-04-16 MED ORDER — ALBUTEROL SULFATE HFA 108 (90 BASE) MCG/ACT IN AERS: 2.0000 | INHALATION_SPRAY | Freq: Four times a day (QID) | RESPIRATORY_TRACT | 0 refills | Status: DC | PRN

## 2024-04-16 NOTE — Patient Instructions (Signed)
 VISIT SUMMARY:  You came in today because of a cough that has been bothering you for four days. You have tried several over-the-counter medications without relief and are feeling generally unwell with body soreness and a scratchy throat. You also mentioned mild left ear pain from a previous injury and requested a refill for your albuterol  inhaler.  YOUR PLAN:  -ACUTE COUGH: Your persistent dry cough is likely due to seasonal allergies. Over-the-counter medications have not been effective. I have prescribed Tessalon  Perles, which you should use three times daily as needed. Additionally, please use your Breo inhaler, one puff daily for one to two weeks.  -SEASONAL ALLERGIES: Your symptoms of cough and scratchy throat are likely related to seasonal allergies. Continue using Flonase  and Zyrtec  as you have been. Use the Breo inhaler as needed.  -GENERAL HEALTH MAINTENANCE: We discussed the need for you to get a flu shot to help protect against the flu this season.  INSTRUCTIONS:  I have sent a prescription for Tessalon  Perles to your pharmacy and refilled your albuterol  inhaler prescription. Please follow up if your symptoms do not improve or if you have any concerns.

## 2024-04-16 NOTE — Progress Notes (Signed)
 Patient ID: [name], female    DOB: Apr 22, 1966  MRN: [id number]  CC: Cough   Subjective: [name] is a 58 y.o. female who presents for chronic ds management. Her concerns today include:  pt with hx of HTN, chronic constipation, anal fistulas, vertigo, HL, chronic back pain (followed by pain specialist, TENS unit placed 10/2022), hot flashes (on gabapentin ) and GERD     Discussed the use of AI scribe software for clinical note transcription with the patient, who gave verbal consent to proceed.  History of Present Illness   [name] is a 58 year old female who presents with an acute cough.  She has had a dry cough for four days, feeling as if something might come up but does not. She is on Flonase  and Zyrtec . She has used, Mucinex, Robitussin, and Theraflu without relief. She denies fever but feels generally unwell with body soreness. Her throat is slightly scratchy, and she has mild left ear pain attributed to a previous injury. She denies shortness of breath and noticeable drainage, though suspects it might occur during sleep. She has not used her Breo inhaler in the past four days and denies sneezing or heartburn. She requests a refill for her albuterol  inhaler. She was the first in her household to experience these symptoms, with no known exposure to others who are sick.       Patient Active Problem List   Diagnosis Date Noted   Hyperlipidemia associated with type 2 diabetes mellitus (HCC) 02/15/2023   New onset type 2 diabetes mellitus (HCC) 02/14/2022   Altered consciousness 12/10/2018   Carpal tunnel syndrome of right wrist 06/12/2018   Postnasal drip 06/12/2018   Vertigo 10/18/2016   Disturbance of skin sensation 04/19/2014   Anal fistula 04/14/2013   UNSPECIFIED SITE OF SPRAIN AND STRAIN 07/07/2009   PEDAL EDEMA 06/29/2009   MUSCLE PAIN 06/23/2009   ANEMIA-IRON DEFICIENCY 03/11/2009   Allergic rhinitis 03/11/2009   SYNCOPE, VASOVAGAL 03/11/2009   HEADACHE  03/11/2009     Current Outpatient Medications on File Prior to Visit  Medication Sig Dispense Refill   amLODipine  (NORVASC ) 10 MG tablet Take 1 tablet (10 mg total) by mouth daily. 90 tablet 1   cetirizine  (ZYRTEC ) 10 MG tablet Take 1 tablet (10 mg total) by mouth daily. 90 tablet 1   cyclobenzaprine  (FLEXERIL ) 10 MG tablet Take 10 mg by mouth 3 (three) times daily.     diclofenac  sodium (VOLTAREN ) 1 % GEL Apply 2 g topically 4 (four) times daily. 100 g 0   EPINEPHrine  (EPIPEN  2-PAK) 0.3 mg/0.3 mL IJ SOAJ injection Inject 0.3 mLs (0.3 mg total) into the muscle as needed for anaphylaxis. 1 each 0   fluticasone  (FLONASE ) 50 MCG/ACT nasal spray Place 1 spray into both nostrils daily. 16 g 3   fluticasone  furoate-vilanterol (BREO ELLIPTA ) 100-25 MCG/ACT AEPB Inhale 1 puff into the lungs daily. 1 each 5   gabapentin  (NEURONTIN ) 300 MG capsule Take 1 capsule (300 mg total) by mouth at bedtime. (Patient taking differently: Take 300 mg by mouth 2 (two) times daily.) 30 capsule 3   hydrOXYzine  (VISTARIL ) 25 MG capsule TAKE 1 CAPSULE(25 MG) BY MOUTH DAILY AS NEEDED FOR ANXIETY 30 capsule 3   ibuprofen  (ADVIL ) 600 MG tablet Take 1 tablet (600 mg total) by mouth every 8 (eight) hours as needed. 40 tablet 1   linaclotide  (LINZESS ) 290 MCG CAPS capsule TAKE 1 CAPSULE(290 MCG) BY MOUTH DAILY BEFORE BREAKFAST 90 capsule 1  meclizine  (ANTIVERT ) 25 MG tablet TAKE 1 TABLET(25 MG) BY MOUTH TWICE DAILY AS NEEDED FOR DIZZINESS 20 tablet 1   metFORMIN  (GLUCOPHAGE ) 500 MG tablet Take 1 tablet (500 mg total) by mouth daily with breakfast. 90 tablet 1   montelukast  (SINGULAIR ) 10 MG tablet Take 1 tablet (10 mg total) by mouth at bedtime. 30 tablet 11   omeprazole  (PRILOSEC) 20 MG capsule Take 1 capsule (20 mg total) by mouth 2 (two) times daily before a meal. 180 capsule 3   rosuvastatin  (CRESTOR ) 20 MG tablet Take 1 tablet (20 mg total) by mouth daily. 90 tablet 1   triamcinolone  cream (KENALOG ) 0.1 % Apply 1  Application topically 2 (two) times daily. 30 g 1   No current facility-administered medications on file prior to visit.    Allergies  Allergen Reactions   Acetaminophen  Itching and Rash    Other Reaction(s): Not available   Ampicillin Anaphylaxis   Atorvastatin  Calcium  Other (See Comments)    Pt reported lip swelling   Penicillins Anaphylaxis    Other Reaction(s): Not available   Sulfonamide Derivatives Anaphylaxis   Tramadol Itching and Rash   Hydrocodone -Acetaminophen  Hives    Have to take a Benadryl  to take med   Hydrocodone -Acetaminophen  Hives and Rash    Have to take a Benadryl  to take med   Amoxicillin     Other Reaction(s): Not available    Social History   Socioeconomic History   Marital status: Single    Spouse name: Not on file   Number of children: 2   Years of education: 10   Highest education level: Not on file  Occupational History   Occupation: N/A  Tobacco Use   Smoking status: Never   Smokeless tobacco: Never  Vaping Use   Vaping status: Never Used  Substance and Sexual Activity   Alcohol use: Never   Drug use: No   Sexual activity: Yes  Other Topics Concern   Not on file  Social History Narrative   Lives at home w/ her mother   Right-handed   Caffeine: 1 cup of tea per day   Social Drivers of Health   Financial Resource Strain: Low Risk  (08/23/2023)   Overall Financial Resource Strain (CARDIA)    Difficulty of Paying Living Expenses: Not very hard  Food Insecurity: Food Insecurity Present (08/23/2023)   Hunger Vital Sign    Worried About Running Out of Food in the Last Year: Never true    Ran Out of Food in the Last Year: Sometimes true  Transportation Needs: No Transportation Needs (08/23/2023)   PRAPARE - Administrator, Civil Service (Medical): No    Lack of Transportation (Non-Medical): No  Physical Activity: Insufficiently Active (08/23/2023)   Exercise Vital Sign    Days of Exercise per Week: 5 days    Minutes of  Exercise per Session: 20 min  Stress: Stress Concern Present (08/23/2023)   [employer] of Occupational Health - Occupational Stress Questionnaire    Feeling of Stress : To some extent  Social Connections: Moderately Integrated (08/23/2023)   Social Connection and Isolation Panel    Frequency of Communication with Friends and Family: More than three times a week    Frequency of Social Gatherings with Friends and Family: More than three times a week    Attends Religious Services: More than 4 times per year    Active Member of [employer] or Organizations: Yes    Attends Banker Meetings: Never  Marital Status: Never married  Intimate Partner Violence: Not At Risk (08/23/2023)   Humiliation, Afraid, Rape, and Kick questionnaire    Fear of Current or Ex-Partner: No    Emotionally Abused: No    Physically Abused: No    Sexually Abused: No    Family History  Problem Relation Age of Onset   Diabetes Mother    Hypertension Mother    Heart attack Father    Seizures Sister    Seizures Sister    Diabetes Brother    Hypertension Brother    Diabetes Maternal Aunt    Diabetes Maternal Uncle    Hypertension Maternal Uncle    Hypertension Son    Colon cancer Neg Hx    Stomach cancer Neg Hx     Past Surgical History:  Procedure Laterality Date   ANAL FISTULECTOMY  08/22/2012   Procedure: FISTULECTOMY ANAL;  Surgeon: Evander Hills, DO;  Location: [facility];  Service: General;  Laterality: N/A;  rectal examination under anesthesia with seton placement    ANAL FISTULECTOMY  09/17/2012   Procedure: FISTULECTOMY ANAL;  Surgeon: Evander Hills, DO;  Location: [facility];  Service: General;  Laterality: N/A;  possible fistulotomy   BACK SURGERY  10/19/2022   D & C HYSTEROSCOPY/ NOVASURE ENDOMETRIAL ABLATION/ I & D THROMBOSED HEMORROID  01-10-2007  DR Leilani Punter ROSS   EXAMINATION UNDER ANESTHESIA  09/17/2012   Procedure: EXAM UNDER ANESTHESIA;  Surgeon: Evander Hills, DO;   Location: [facility];  Service: General;  Laterality: N/A;  Rectal exam under anesthesia   EXAMINATION UNDER ANESTHESIA N/A 05/14/2013   Procedure: EXAM UNDER ANESTHESIA;  Surgeon: Joyce Nixon, MD;  Location: [facility];  Service: General;  Laterality: N/A;   FISTULA PLUG  09/17/2012   Procedure: FISTULA PLUG;  Surgeon: Evander Hills, DO;  Location: [facility];  Service: General;  Laterality: N/A;  Possible fistula plug   HEMORRHOID SURGERY  04/28/2009   PPH INTERNAL HEMORRHOIDS   IRRIGATION AND DEBRIDEMENT ABSCESS  07/08/2012   Procedure: MINOR INCISION AND DRAINAGE OF ABSCESS;  Surgeon: Johnn Najjar, MD;  Location[facility];  Service: Gynecology;  Laterality: N/A;  Vulvar Abscess   PLACEMENT OF SETON N/A 05/14/2013   Procedure:  PLACEMENT OF SETON;  Surgeon: Joyce Nixon, MD;  Location: [facility];  Service: General;  Laterality: N/A;   RECTAL EXAM UNDER ANESTHESIA N/A 08/20/2013   Procedure: mucosal advancement flap ;  Surgeon: Joyce Nixon, MD;  Location: [facility];  Service: General;  Laterality: N/A;  parks anal retractor long rectal instrucments prone jack knife anal fistula    RECTAL ULTRASOUND N/A 07/20/2013   Procedure: RECTAL ULTRASOUND;  Surgeon: Joyce Nixon, MD;  Location: [facility];  Service: Endoscopy;  Laterality: N/A;    ROS: Review of Systems Negative except as stated above  PHYSICAL EXAM: BP 117/77 (BP Location: Left Arm, Patient Position: Sitting, Cuff Size: Normal)   Pulse 72   Resp 18   Ht 5' 4 (1.626 m)   Wt 147 lb 12.8 oz (67 kg)   SpO2 98%   BMI 25.37 kg/m   Physical Exam  General appearance - alert, well appearing, older African-American female and in no distress Mental status - normal mood, behavior, speech, dress, motor activity, and thought processes Ears - bilateral TM's and external ear canals normal Nose - normal and patent, no erythema, discharge or polyps Mouth  - mucous membranes moist, pharynx normal without lesions Neck -  supple, no significant adenopathy Chest - clear to auscultation, no wheezes, rales or rhonchi, symmetric air entry Heart - normal rate, regular rhythm, normal S1, S2, no murmurs, rubs, clicks or gallops      Latest Ref Rng & Units 02/25/2024   11:02 AM 07/11/2023   12:30 PM 06/11/2023    2:53 PM  CMP  Glucose 70 - 99 mg/dL 91  98    BUN 6 - 24 mg/dL 8  10    Creatinine 0.45 - 1.00 mg/dL 4.09  8.11    Sodium 914 - 144 mmol/L 141  144    Potassium 3.5 - 5.2 mmol/L 4.2  4.5    Chloride 96 - 106 mmol/L 101  104    CO2 20 - 29 mmol/L 24  26    Calcium  8.7 - 10.2 mg/dL 9.4  9.6    Total Protein 6.0 - 8.5 g/dL 7.1  7.0  7.6   Total Bilirubin 0.0 - 1.2 mg/dL 0.2  0.2  0.4   Alkaline Phos 44 - 121 IU/L 64  71  59   AST 0 - 40 IU/L 20  24  40   ALT 0 - 32 IU/L 18  23  55    Lipid Panel     Component Value Date/Time   CHOL 170 02/12/2022 0937   TRIG 107 02/12/2022 0937   HDL 58 02/12/2022 0937   CHOLHDL 2.9 02/12/2022 0937   CHOLHDL 3.8 03/19/2009 0414   VLDL 13 03/19/2009 0414   LDLCALC 93 02/12/2022 0937    CBC    Component Value Date/Time   WBC 6.7 02/25/2024 1102   WBC 4.1 05/03/2023 0821   RBC 4.26 02/25/2024 1102   RBC 4.32 05/03/2023 0821   HGB 12.8 02/25/2024 1102   HGB 13.8 09/05/2009 1347   HCT 38.2 02/25/2024 1102   HCT 41.3 09/05/2009 1347   PLT 245 02/25/2024 1102   MCV 90 02/25/2024 1102   MCV 92.0 09/05/2009 1347   MCH 30.0 02/25/2024 1102   MCH 29.2 05/03/2023 0821   MCHC 33.5 02/25/2024 1102   MCHC 32.7 05/03/2023 0821   RDW 12.9 02/25/2024 1102   RDW 13.0 09/05/2009 1347   LYMPHSABS 2.3 02/25/2024 1102   LYMPHSABS 2.1 09/05/2009 1347   MONOABS 0.2 05/03/2023 0821   MONOABS 0.3 09/05/2009 1347   EOSABS 0.1 02/25/2024 1102   BASOSABS 0.1 02/25/2024 1102   BASOSABS 0.1 09/05/2009 1347    ASSESSMENT AND PLAN: 1. Acute cough (Primary) Likely related to allergies. Advised that she use her  Breo inhaler for the next several days.  Prescription given for Tessalon  [place] - albuterol  (VENTOLIN  HFA) 108 (90 Base) MCG/ACT inhaler; Inhale 2 puffs into the lungs every 6 (six) hours as needed for wheezing or shortness of breath.  Dispense: 6.7 g; Refill: 0 - benzonatate  (TESSALON  PERLES) 100 MG capsule; Take 1 capsule (100 mg total) by mouth 3 (three) times daily as needed.  Dispense: 30 capsule; Refill: 0  2. Seasonal allergies See #1 above.  Flonase  and Zyrtec  - albuterol  (VENTOLIN  HFA) 108 (90 Base) MCG/ACT inhaler; Inhale 2 puffs into the lungs every 6 (six) hours as needed for wheezing or shortness of breath.  Dispense: 6.7 g; Refill: 0 - benzonatate  (TESSALON  PERLES) 100 MG capsule; Take 1 capsule (100 mg total) by mouth 3 (three) times daily as needed.  Dispense: 30 capsule; Refill: 0    Patient was given the opportunity to ask questions.  Patient verbalized understanding of the plan  and was able to repeat key elements of the plan.   This documentation was completed using Paediatric nurse.  Any transcriptional errors are unintentional.  No orders of the defined types were placed in this encounter.    Requested Prescriptions   Signed Prescriptions Disp Refills   albuterol  (VENTOLIN  HFA) 108 (90 Base) MCG/ACT inhaler 6.7 g 0    Sig: Inhale 2 puffs into the lungs every 6 (six) hours as needed for wheezing or shortness of breath.   benzonatate  (TESSALON  PERLES) 100 MG capsule 30 capsule 0    Sig: Take 1 capsule (100 mg total) by mouth 3 (three) times daily as needed.    Return if symptoms worsen or fail to improve.  Concetta Dee, MD, FACP

## 2024-04-30 ENCOUNTER — Ambulatory Visit: Admitting: Internal Medicine

## 2024-05-11 ENCOUNTER — Other Ambulatory Visit: Payer: Self-pay | Admitting: Internal Medicine

## 2024-05-11 DIAGNOSIS — K5909 Other constipation: Secondary | ICD-10-CM

## 2024-05-11 DIAGNOSIS — R051 Acute cough: Secondary | ICD-10-CM

## 2024-05-11 DIAGNOSIS — J302 Other seasonal allergic rhinitis: Secondary | ICD-10-CM

## 2024-07-09 ENCOUNTER — Other Ambulatory Visit: Payer: Self-pay | Admitting: Internal Medicine

## 2024-07-09 DIAGNOSIS — F411 Generalized anxiety disorder: Secondary | ICD-10-CM

## 2024-07-10 ENCOUNTER — Other Ambulatory Visit: Payer: Self-pay | Admitting: Internal Medicine

## 2024-07-10 DIAGNOSIS — J302 Other seasonal allergic rhinitis: Secondary | ICD-10-CM

## 2024-07-10 DIAGNOSIS — E1169 Type 2 diabetes mellitus with other specified complication: Secondary | ICD-10-CM

## 2024-07-10 DIAGNOSIS — R051 Acute cough: Secondary | ICD-10-CM

## 2024-07-10 DIAGNOSIS — I152 Hypertension secondary to endocrine disorders: Secondary | ICD-10-CM

## 2024-07-10 MED ORDER — METFORMIN HCL 500 MG PO TABS: 500.0000 mg | ORAL_TABLET | Freq: Every day | ORAL | 1 refills | Status: AC

## 2024-07-10 MED ORDER — AMLODIPINE BESYLATE 10 MG PO TABS: 10.0000 mg | ORAL_TABLET | Freq: Every day | ORAL | 1 refills | Status: AC

## 2024-07-10 MED ORDER — BENZONATATE 100 MG PO CAPS: 100.0000 mg | ORAL_CAPSULE | Freq: Three times a day (TID) | ORAL | 0 refills | Status: DC

## 2024-07-10 NOTE — Telephone Encounter (Signed)
 Requested medication (s) are due for refill today: no except for benzonatate   Requested medication (s) are on the active medication list: yes  Last refill:  metformin  and amlodipine : 02/25/24 #90 1 RF              benzonatate : 05/11/24 #30  Future visit scheduled: no  Notes to clinic:  called pharmacy but it was a 5 caller hold had to disconnect- should have enough metformin  and amlodipine  until 08/26/24   Requested Prescriptions  Pending Prescriptions Disp Refills   metFORMIN  (GLUCOPHAGE ) 500 MG tablet 90 tablet 1    Sig: Take 1 tablet (500 mg total) by mouth daily with breakfast.     Endocrinology:  Diabetes - Biguanides Failed - 07/10/2024  2:20 PM      Failed - HBA1C is between 0 and 7.9 and within 180 days    HbA1c, POC (controlled diabetic range)  Date Value Ref Range Status  07/11/2023 5.7 0.0 - 7.0 % Final         Failed - B12 Level in normal range and within 720 days    Vitamin B-12  Date Value Ref Range Status  03/18/2009 496 211 - 911 pg/mL Final         Passed - Cr in normal range and within 360 days    Creatinine, Ser  Date Value Ref Range Status  02/25/2024 0.76 0.57 - 1.00 mg/dL Final         Passed - eGFR in normal range and within 360 days    GFR calc Af Amer  Date Value Ref Range Status  01/28/2020 90 >59 mL/min/1.73 Final   GFR, Estimated  Date Value Ref Range Status  05/03/2023 >60 >60 mL/min Final    Comment:    (NOTE) Calculated using the CKD-EPI Creatinine Equation (2021)    GFR  Date Value Ref Range Status  03/01/2015 113.70 >60.00 mL/min Final   eGFR  Date Value Ref Range Status  02/25/2024 90 >59 mL/min/1.73 Final         Passed - Valid encounter within last 6 months    Recent Outpatient Visits           2 months ago Acute cough   [place] [facility] [place] [facility] Dept [facility] [place]. [facility] Vicci Barnie NOVAK, MD   4 months ago Essential hypertension   [place] [facility] - A Dept Of [place]. [facility] Theotis Haze ORN, NP   10 months ago Diabetes mellitus treated with oral medication [facility])   [place] [facility] - [facility] Dept Of [place]. [facility] Vicci Barnie NOVAK, MD   1 year ago Viral illness   [place] [facility] [place] - [facility] Dept Of [place]. [facility] Vicci Barnie NOVAK, MD   1 year ago Right lower quadrant abdominal pain   [place] [facility] [place] - [facility] Dept Of [place]. [facility], Jon M, [place]              Passed - CBC within normal limits and completed in the last 12 months    WBC  Date Value Ref Range Status  02/25/2024 6.7 3.4 - 10.8 x10E3/uL Final  05/03/2023 4.1 4.0 - 10.5 K/uL Final   RBC  Date Value Ref Range Status  02/25/2024 4.26 3.77 - 5.28 x10E6/uL Final  05/03/2023 4.32 3.87 - 5.11 MIL/uL Final   Hemoglobin  Date Value Ref  Range Status  02/25/2024 12.8 11.1 - 15.9 g/dL Final   HGB  Date Value Ref Range Status  09/05/2009 13.8 11.6 - 15.9 g/dL Final   HCT  Date Value Ref Range Status  09/05/2009 41.3 34.8 - 46.6 % Final   Hematocrit  Date Value Ref Range Status  02/25/2024 38.2 34.0 - 46.6 % Final   MCHC  Date Value Ref Range Status  02/25/2024 33.5 31.5 - 35.7 g/dL Final  93/71/7975 67.2 30.0 - 36.0 g/dL Final   [facility]  Date Value Ref Range Status  02/25/2024 30.0 26.6 - 33.0 pg Final  05/03/2023 29.2 26.0 - 34.0 pg Final   MCV  Date Value Ref Range Status  02/25/2024 90 79 - 97 fL Final  09/05/2009 92.0 79.5 - 101.0 fL Final   No results found for: PLTCOUNTKUC, LABPLAT, POCPLA RDW  Date Value Ref Range Status  02/25/2024 12.9 11.7 - 15.4 % Final  09/05/2009 13.0 11.2 - 14.5 % Final          amLODipine  (NORVASC ) 10 MG tablet 90 tablet 1    Sig: Take 1 tablet (10 mg total) by mouth daily.     Cardiovascular: Calcium  Channel Blockers 2 Passed - 07/10/2024  2:20 PM      Passed - Last BP in normal range    BP Readings from Last 1 Encounters:   04/16/24 117/77         Passed - Last Heart Rate in normal range    Pulse Readings from Last 1 Encounters:  04/16/24 72         Passed - Valid encounter within last 6 months    Recent Outpatient Visits           2 months ago Acute cough   [place] [facility] [place] [facility] [place]. [facility] Vicci Barnie NOVAK, MD   4 months ago Essential hypertension   [place] [facility] [place] - [facility] Dept Of [place]. [facility] Theotis Haze ORN, NP   10 months ago Diabetes mellitus treated with oral medication [facility])   [place] [facility] - [facility] Dept Of [place]. [facility] Vicci Barnie NOVAK, MD   1 year ago Viral illness   [place] [facility] [place] - [facility] Dept Of [place]. [facility] Vicci Barnie NOVAK, MD   1 year ago Right lower quadrant abdominal pain   [place] [facility] [place] - [facility] Dept Of [place]. [facility], Jon M, PA-C               benzonatate  (TESSALON ) 100 MG capsule 30 capsule 0     Ear, Nose, and Throat:  Antitussives/Expectorants Passed - 07/10/2024  2:20 PM      Passed - Valid encounter within last 12 months    Recent Outpatient Visits           2 months ago Acute cough   [place] [facility] [place] - [facility] Dept Of [place]. [facility] Vicci Barnie NOVAK, MD   4 months ago Essential hypertension   [place] [facility] [place] - [facility] Dept Of [place]. [facility] Theotis Haze ORN, NP   10 months ago Diabetes mellitus treated with oral medication [facility])   [place] [facility] - [facility] Dept Of [place]. [facility] Vicci Barnie NOVAK, MD   1 year ago Viral illness   [facility] [place] - [facility] Dept Of [place]. [facility] Vicci Barnie NOVAK, MD   1 year ago Right lower quadrant abdominal pain   [place] [facility] [place] - A Dept Of [place]. [facility] [place], [place], PA-C

## 2024-07-10 NOTE — Telephone Encounter (Signed)
 Requested medications are due for refill today.  unsure  Requested medications are on the active medications list.  yes  Last refill. 05/11/2024 #30 0 rf  Future visit scheduled.   no  Notes to clinic.  Not usually a maintenance medication. Please review for refill.    Requested Prescriptions  Pending Prescriptions Disp Refills   benzonatate  (TESSALON ) 100 MG capsule [Pharmacy Med Name: BENZONATATE  100MG  CAPSULES] 30 capsule 0    Sig: TAKE 1 CAPSULE(100 MG) BY MOUTH THREE TIMES DAILY AS NEEDED     Ear, Nose, and Throat:  Antitussives/Expectorants Passed - 07/10/2024  2:09 PM      Passed - Valid encounter within last 12 months    Recent Outpatient Visits           2 months ago Acute cough   [place] [facility] [place] - [facility] Dept Of [place]. [facility] Vicci Barnie NOVAK, MD   4 months ago Essential hypertension   [place] [facility] [place] - [facility] Dept Of [place]. [facility] Theotis Haze ORN, NP   10 months ago Diabetes mellitus treated with oral medication [facility])   [place] [facility] - [facility] Dept Of [place]. [facility] Vicci Barnie NOVAK, MD   1 year ago Viral illness   [place] [facility] [place] - A Dept Of [place]. [facility] Vicci Barnie NOVAK, MD   1 year ago Right lower quadrant abdominal pain    [facility] [place] - A Dept Of [place]. [facility] [place], [place], PA-C

## 2024-07-10 NOTE — Telephone Encounter (Signed)
 Copied from CRM [id number]. Topic: Clinical - Medication Refill >> Jul 10, 2024  8:10 AM Charlet HERO wrote: Medication: benzonatate  (TESSALON ) 100 MG capsule metFORMIN  (GLUCOPHAGE ) 500 MG tablet amLODipine  (NORVASC ) 10 MG tablet Has the patient contacted their pharmacy? No 0 refills  This is the patient's preferred pharmacy:  [id number] - [place], [place] - [address] AT [facility] OF [address] [place] [address] Phone: [phone] Fax: [phone]  Is this the correct pharmacy for this prescription? Yes If no, delete pharmacy and type the correct one.   Has the prescription been filled recently? Yes  Is the patient out of the medication? Yes  Has the patient been seen for an appointment in the last year OR does the patient have an upcoming appointment? Yes  Can we respond through MyChart? Yes  Agent: Please be advised that Rx refills may take up to 3 business days. We ask that you follow-up with your pharmacy.

## 2024-07-10 NOTE — Telephone Encounter (Signed)
 Need to call pharmacy  - pt should have enough medication to last until 08/2024. Tessalon  is not usually a maintenance medication  - need provider to review.

## 2024-07-31 ENCOUNTER — Inpatient Hospital Stay (HOSPITAL_COMMUNITY)
Admission: EM | Admit: 2024-07-31 | Discharge: 2024-08-03 | DRG: 417 | Disposition: A | Discharge status: Home / Self Care | Attending: Family Medicine | Admitting: Family Medicine

## 2024-07-31 ENCOUNTER — Emergency Department (HOSPITAL_COMMUNITY)

## 2024-07-31 DIAGNOSIS — Z885 Allergy status to narcotic agent status: Secondary | ICD-10-CM

## 2024-07-31 DIAGNOSIS — I10 Essential (primary) hypertension: Secondary | ICD-10-CM | POA: Diagnosis present

## 2024-07-31 DIAGNOSIS — Z7984 Long term (current) use of oral hypoglycemic drugs: Secondary | ICD-10-CM

## 2024-07-31 DIAGNOSIS — R42 Dizziness and giddiness: Secondary | ICD-10-CM | POA: Diagnosis present

## 2024-07-31 DIAGNOSIS — K8050 Calculus of bile duct without cholangitis or cholecystitis without obstruction: Secondary | ICD-10-CM

## 2024-07-31 DIAGNOSIS — K219 Gastro-esophageal reflux disease without esophagitis: Secondary | ICD-10-CM | POA: Diagnosis present

## 2024-07-31 DIAGNOSIS — K8071 Calculus of gallbladder and bile duct without cholecystitis with obstruction: Principal | ICD-10-CM | POA: Diagnosis present

## 2024-07-31 DIAGNOSIS — J302 Other seasonal allergic rhinitis: Secondary | ICD-10-CM | POA: Diagnosis present

## 2024-07-31 DIAGNOSIS — E119 Type 2 diabetes mellitus without complications: Secondary | ICD-10-CM | POA: Diagnosis present

## 2024-07-31 DIAGNOSIS — Z833 Family history of diabetes mellitus: Secondary | ICD-10-CM

## 2024-07-31 DIAGNOSIS — Z888 Allergy status to other drugs, medicaments and biological substances status: Secondary | ICD-10-CM

## 2024-07-31 DIAGNOSIS — K8031 Calculus of bile duct with cholangitis, unspecified, with obstruction: Secondary | ICD-10-CM | POA: Diagnosis present

## 2024-07-31 DIAGNOSIS — Z880 Allergy status to penicillin: Secondary | ICD-10-CM

## 2024-07-31 DIAGNOSIS — Z5941 Food insecurity: Secondary | ICD-10-CM

## 2024-07-31 DIAGNOSIS — M549 Dorsalgia, unspecified: Secondary | ICD-10-CM | POA: Diagnosis present

## 2024-07-31 DIAGNOSIS — K659 Peritonitis, unspecified: Secondary | ICD-10-CM | POA: Diagnosis present

## 2024-07-31 DIAGNOSIS — E785 Hyperlipidemia, unspecified: Secondary | ICD-10-CM | POA: Diagnosis present

## 2024-07-31 DIAGNOSIS — K8070 Calculus of gallbladder and bile duct without cholecystitis without obstruction: Secondary | ICD-10-CM

## 2024-07-31 DIAGNOSIS — R1031 Right lower quadrant pain: Principal | ICD-10-CM

## 2024-07-31 DIAGNOSIS — K760 Fatty (change of) liver, not elsewhere classified: Secondary | ICD-10-CM | POA: Diagnosis present

## 2024-07-31 DIAGNOSIS — Z7951 Long term (current) use of inhaled steroids: Secondary | ICD-10-CM

## 2024-07-31 DIAGNOSIS — Z79899 Other long term (current) drug therapy: Secondary | ICD-10-CM

## 2024-07-31 DIAGNOSIS — G8929 Other chronic pain: Secondary | ICD-10-CM | POA: Diagnosis present

## 2024-07-31 DIAGNOSIS — Z8249 Family history of ischemic heart disease and other diseases of the circulatory system: Secondary | ICD-10-CM

## 2024-07-31 DIAGNOSIS — Z9682 Presence of neurostimulator: Secondary | ICD-10-CM

## 2024-07-31 DIAGNOSIS — K8309 Other cholangitis: Secondary | ICD-10-CM | POA: Insufficient documentation

## 2024-07-31 DIAGNOSIS — Z886 Allergy status to analgesic agent status: Secondary | ICD-10-CM

## 2024-07-31 DIAGNOSIS — R7401 Elevation of levels of liver transaminase levels: Secondary | ICD-10-CM | POA: Diagnosis present

## 2024-07-31 DIAGNOSIS — Z881 Allergy status to other antibiotic agents status: Secondary | ICD-10-CM

## 2024-07-31 HISTORY — DX: Type 2 diabetes mellitus without complications: E11.9

## 2024-07-31 LAB — CBC WITH DIFFERENTIAL/PLATELET
Abs Immature Granulocytes: 0.03 K/uL (ref 0.00–0.07)
Basophils Absolute: 0 K/uL (ref 0.0–0.1)
Basophils Relative: 0 %
Eosinophils Absolute: 0 K/uL (ref 0.0–0.5)
Eosinophils Relative: 0 %
HCT: 39.2 % (ref 36.0–46.0)
Hemoglobin: 12.8 g/dL (ref 12.0–15.0)
Immature Granulocytes: 0 %
Lymphocytes Relative: 7 %
Lymphs Abs: 0.7 K/uL (ref 0.7–4.0)
MCH: 29.6 pg (ref 26.0–34.0)
MCHC: 32.7 g/dL (ref 30.0–36.0)
MCV: 90.5 fL (ref 80.0–100.0)
Monocytes Absolute: 0.6 K/uL (ref 0.1–1.0)
Monocytes Relative: 6 %
Neutro Abs: 8.4 K/uL — ABNORMAL HIGH (ref 1.7–7.7)
Neutrophils Relative %: 87 %
Platelets: 225 K/uL (ref 150–400)
RBC: 4.33 MIL/uL (ref 3.87–5.11)
RDW: 12.6 % (ref 11.5–15.5)
WBC: 9.8 K/uL (ref 4.0–10.5)
nRBC: 0 % (ref 0.0–0.2)

## 2024-07-31 LAB — COMPREHENSIVE METABOLIC PANEL WITH GFR
ALT: 852 U/L — ABNORMAL HIGH (ref 0–44)
AST: 937 U/L — ABNORMAL HIGH (ref 15–41)
Albumin: 4 g/dL (ref 3.5–5.0)
Alkaline Phosphatase: 259 U/L — ABNORMAL HIGH (ref 38–126)
Anion gap: 11 (ref 5–15)
BUN: 9 mg/dL (ref 6–20)
CO2: 26 mmol/L (ref 22–32)
Calcium: 9.4 mg/dL (ref 8.9–10.3)
Chloride: 101 mmol/L (ref 98–111)
Creatinine, Ser: 0.82 mg/dL (ref 0.44–1.00)
GFR, Estimated: >60 mL/min (ref >60)
Glucose, Bld: 127 mg/dL — ABNORMAL HIGH (ref 70–99)
Potassium: 3.6 mmol/L (ref 3.5–5.1)
Sodium: 138 mmol/L (ref 135–145)
Total Bilirubin: 2.1 mg/dL — ABNORMAL HIGH (ref 0.0–1.2)
Total Protein: 7.4 g/dL (ref 6.5–8.1)

## 2024-07-31 MED ORDER — ONDANSETRON HCL 4 MG/2ML IJ SOLN
4.0000 mg | Freq: Once | INTRAMUSCULAR | Status: AC
  Administered 2024-07-31: 4 mg via INTRAVENOUS
  Filled 2024-07-31: qty 2

## 2024-07-31 MED ORDER — FENTANYL CITRATE PF 50 MCG/ML IJ SOSY
50.0000 ug | PREFILLED_SYRINGE | Freq: Once | INTRAMUSCULAR | Status: AC
  Administered 2024-07-31: 50 ug via INTRAVENOUS
  Filled 2024-07-31: qty 1

## 2024-07-31 MED ORDER — SODIUM CHLORIDE 0.9 % IV SOLN: INTRAVENOUS | Status: DC

## 2024-07-31 NOTE — ED Notes (Signed)
 Difficulty obtaining lab work from patient. Phlebotomy to see pt for ordered labs.

## 2024-07-31 NOTE — ED Triage Notes (Signed)
 BIB [facility] from home. Right lower quad pain started yesterday, radiating to right upper quad. 10/10 guarding and tenderness to touch. 150mcg fentayl given. Pain 8/10 Lumbar battery in place for disc concerns 20g L 122/74 88HR 112CBG

## 2024-07-31 NOTE — ED Provider Notes (Signed)
 [place] [facility] AT [facility] Provider Note   CSN: [id number] Arrival date & time: 07/31/24  8046     Patient presents with: Abdominal Pain   [name] is a 58 y.o. female.   HPI Patient presents with abdominal pain. Pain began about 24 hours ago, since that time has become more severe, with associated nausea, anorexia, but no vomiting, no diarrhea. No urinary complaints.  Pain is focally in the right lower quadrant.    Prior to Admission medications   Medication Sig Start Date End Date Taking? Authorizing Provider  albuterol  (VENTOLIN  HFA) 108 (90 Base) MCG/ACT inhaler Inhale 2 puffs into the lungs every 6 (six) hours as needed for wheezing or shortness of breath. 04/16/24   Vicci Barnie NOVAK, MD  amLODipine  (NORVASC ) 10 MG tablet Take 1 tablet (10 mg total) by mouth daily. 07/10/24   Vicci Barnie NOVAK, MD  benzonatate  (TESSALON ) 100 MG capsule Take 1 capsule (100 mg total) by mouth 3 (three) times daily. 07/10/24   Vicci Barnie NOVAK, MD  cetirizine  (ZYRTEC ) 10 MG tablet Take 1 tablet (10 mg total) by mouth daily. 02/25/24   Fleming, Zelda W, NP  cyclobenzaprine  (FLEXERIL ) 10 MG tablet Take 10 mg by mouth 3 (three) times daily. 09/13/22   [provider]  diclofenac  sodium (VOLTAREN ) 1 % GEL Apply 2 g topically 4 (four) times daily. 04/01/19   Murray, Alyssa B, PA-C  EPINEPHrine  (EPIPEN  2-PAK) 0.3 mg/0.3 mL IJ SOAJ injection Inject 0.3 mLs (0.3 mg total) into the muscle as needed for anaphylaxis. 04/18/20   Donah Riis A, PA-C  fluticasone  (FLONASE ) 50 MCG/ACT nasal spray Place 1 spray into both nostrils daily. 02/25/24   Theotis Haze ORN, NP  fluticasone  furoate-vilanterol (BREO ELLIPTA ) 100-25 MCG/ACT AEPB Inhale 1 puff into the lungs daily. 07/18/23   Meade Verdon RAMAN, MD  gabapentin  (NEURONTIN ) 300 MG capsule Take 1 capsule (300 mg total) by mouth at bedtime. Patient taking differently: Take 300 mg by mouth 2 (two) times daily. 07/05/20   Vicci Barnie NOVAK, MD  hydrOXYzine  (VISTARIL ) 25 MG capsule TAKE 1 CAPSULE(25 MG) BY MOUTH DAILY AS NEEDED FOR ANXIETY 07/09/24   Vicci Barnie NOVAK, MD  ibuprofen  (ADVIL ) 600 MG tablet Take 1 tablet (600 mg total) by mouth every 8 (eight) hours as needed. 08/23/23   Vicci Barnie NOVAK, MD  LINZESS  290 MCG CAPS capsule TAKE 1 CAPSULE(290 MCG) BY MOUTH DAILY BEFORE BREAKFAST 05/11/24   Vicci Barnie NOVAK, MD  meclizine  (ANTIVERT ) 25 MG tablet TAKE 1 TABLET(25 MG) BY MOUTH TWICE DAILY AS NEEDED FOR DIZZINESS 02/25/24   Fleming, Zelda W, NP  metFORMIN  (GLUCOPHAGE ) 500 MG tablet Take 1 tablet (500 mg total) by mouth daily with breakfast. 07/10/24   Vicci Barnie NOVAK, MD  montelukast  (SINGULAIR ) 10 MG tablet Take 1 tablet (10 mg total) by mouth at bedtime. 07/18/23   Meade Verdon RAMAN, MD  omeprazole  (PRILOSEC) 20 MG capsule Take 1 capsule (20 mg total) by mouth 2 (two) times daily before a meal. 08/23/23   Vicci Barnie NOVAK, MD  rosuvastatin  (CRESTOR ) 20 MG tablet Take 1 tablet (20 mg total) by mouth daily. 02/25/24   Fleming, Zelda W, NP  triamcinolone  cream (KENALOG ) 0.1 % Apply 1 Application topically 2 (two) times daily. 02/25/24   Fleming, Zelda W, NP    Allergies: Acetaminophen , Ampicillin, Atorvastatin  calcium , Penicillins, Sulfonamide derivatives, Tramadol, Hydrocodone -acetaminophen , Hydrocodone -acetaminophen , and Amoxicillin    Review of Systems  Updated Vital Signs BP 130/77  Pulse 87   Temp 98.7 F (37.1 C)   Resp 16   SpO2 96%   Physical Exam Vitals and nursing note reviewed.  Constitutional:      General: She is not in acute distress.    Appearance: She is well-developed.  HENT:     Head: Normocephalic and atraumatic.  Eyes:     Conjunctiva/sclera: Conjunctivae normal.  Cardiovascular:     Rate and Rhythm: Normal rate and regular rhythm.  Pulmonary:     Effort: Pulmonary effort is normal. No respiratory distress.     Breath sounds: No stridor.  Abdominal:     General: There is no  distension.     Tenderness: There is abdominal tenderness in the right lower quadrant. There is guarding. Positive signs include McBurney's sign.  Skin:    General: Skin is warm and dry.  Neurological:     Mental Status: She is alert and oriented to person, place, and time.     Cranial Nerves: No cranial nerve deficit.  Psychiatric:        Mood and Affect: Mood normal.     (all labs ordered are listed, but only abnormal results are displayed) Labs Reviewed  COMPREHENSIVE METABOLIC PANEL WITH GFR - Abnormal; Notable for the following components:      Result Value   Glucose, Bld 127 (*)    AST 937 (*)    ALT 852 (*)    Alkaline Phosphatase 259 (*)    Total Bilirubin 2.1 (*)    All other components within normal limits  CBC WITH DIFFERENTIAL/PLATELET - Abnormal; Notable for the following components:   Neutro Abs 8.4 (*)    All other components within normal limits  LIPASE, BLOOD  URINALYSIS, ROUTINE W REFLEX MICROSCOPIC    EKG: None  Radiology: No results found.   Procedures   Medications Ordered in the ED  0.9 %  sodium chloride  infusion ( Intravenous New Bag/Given 07/31/24 2133)  fentaNYL  (SUBLIMAZE ) injection 50 mcg (50 mcg Intravenous Given 07/31/24 2130)  ondansetron  (ZOFRAN ) injection 4 mg (4 mg Intravenous Given 07/31/24 2129)  fentaNYL  (SUBLIMAZE ) injection 50 mcg (50 mcg Intravenous Given 07/31/24 2309)                                    Medical Decision Making Well-appearing adult female presents with abdominal pain x 24 hours.  Location right lower quadrant concerning for appendicitis, though other G acute or reproductive organ pathology are considered. Patient's initial vital signs reassuring, and peritonitis is local not general also reassuring. Patient received analgesics, antiemetics, CT, labs ordered.  Amount and/or Complexity of Data Reviewed Labs: ordered. Radiology: ordered.  Risk Prescription drug management. Decision regarding  hospitalization.   11:36 PM The patient's labs notable for elevated hepatic enzymes, though her pain is largely in the right lower quadrant, CT scan is already ordered, ultrasound will be ordered as well with additional concern of cholecystitis, though she is afebrile, has had no vomiting, and has no epigastric or substantial upper abdominal pain.     Final diagnoses:  Right lower quadrant abdominal pain    ED Discharge Orders     None          Garrick Charleston, MD 07/31/24 2337

## 2024-07-31 NOTE — ED Notes (Signed)
 Per CT, CT pending at this time due to labs needing to be resulted.

## 2024-07-31 NOTE — ED Notes (Signed)
 ED Provider at bedside.

## 2024-08-01 ENCOUNTER — Emergency Department (HOSPITAL_COMMUNITY)

## 2024-08-01 ENCOUNTER — Inpatient Hospital Stay (HOSPITAL_COMMUNITY)

## 2024-08-01 ENCOUNTER — Encounter (HOSPITAL_COMMUNITY): Admission: EM | Disposition: A | Payer: Self-pay | Source: Home / Self Care | Attending: Family Medicine

## 2024-08-01 ENCOUNTER — Inpatient Hospital Stay (HOSPITAL_COMMUNITY): Admitting: Anesthesiology

## 2024-08-01 ENCOUNTER — Encounter (HOSPITAL_COMMUNITY): Payer: Self-pay

## 2024-08-01 ENCOUNTER — Other Ambulatory Visit: Payer: Self-pay

## 2024-08-01 DIAGNOSIS — Z880 Allergy status to penicillin: Secondary | ICD-10-CM | POA: Diagnosis not present

## 2024-08-01 DIAGNOSIS — K760 Fatty (change of) liver, not elsewhere classified: Secondary | ICD-10-CM | POA: Diagnosis present

## 2024-08-01 DIAGNOSIS — Z888 Allergy status to other drugs, medicaments and biological substances status: Secondary | ICD-10-CM | POA: Diagnosis not present

## 2024-08-01 DIAGNOSIS — K659 Peritonitis, unspecified: Secondary | ICD-10-CM | POA: Diagnosis present

## 2024-08-01 DIAGNOSIS — K8071 Calculus of gallbladder and bile duct without cholecystitis with obstruction: Secondary | ICD-10-CM | POA: Diagnosis present

## 2024-08-01 DIAGNOSIS — Z5941 Food insecurity: Secondary | ICD-10-CM | POA: Diagnosis not present

## 2024-08-01 DIAGNOSIS — R1011 Right upper quadrant pain: Secondary | ICD-10-CM

## 2024-08-01 DIAGNOSIS — R42 Dizziness and giddiness: Secondary | ICD-10-CM

## 2024-08-01 DIAGNOSIS — I10 Essential (primary) hypertension: Secondary | ICD-10-CM | POA: Diagnosis present

## 2024-08-01 DIAGNOSIS — J302 Other seasonal allergic rhinitis: Secondary | ICD-10-CM | POA: Diagnosis present

## 2024-08-01 DIAGNOSIS — Z9682 Presence of neurostimulator: Secondary | ICD-10-CM

## 2024-08-01 DIAGNOSIS — Z833 Family history of diabetes mellitus: Secondary | ICD-10-CM | POA: Diagnosis not present

## 2024-08-01 DIAGNOSIS — G8929 Other chronic pain: Secondary | ICD-10-CM | POA: Diagnosis present

## 2024-08-01 DIAGNOSIS — E119 Type 2 diabetes mellitus without complications: Secondary | ICD-10-CM

## 2024-08-01 DIAGNOSIS — Z8249 Family history of ischemic heart disease and other diseases of the circulatory system: Secondary | ICD-10-CM | POA: Diagnosis not present

## 2024-08-01 DIAGNOSIS — E785 Hyperlipidemia, unspecified: Secondary | ICD-10-CM | POA: Diagnosis present

## 2024-08-01 DIAGNOSIS — K8031 Calculus of bile duct with cholangitis, unspecified, with obstruction: Secondary | ICD-10-CM | POA: Diagnosis present

## 2024-08-01 DIAGNOSIS — Z885 Allergy status to narcotic agent status: Secondary | ICD-10-CM | POA: Diagnosis not present

## 2024-08-01 DIAGNOSIS — K8050 Calculus of bile duct without cholangitis or cholecystitis without obstruction: Secondary | ICD-10-CM

## 2024-08-01 DIAGNOSIS — K8070 Calculus of gallbladder and bile duct without cholecystitis without obstruction: Secondary | ICD-10-CM | POA: Diagnosis not present

## 2024-08-01 DIAGNOSIS — Z886 Allergy status to analgesic agent status: Secondary | ICD-10-CM | POA: Diagnosis not present

## 2024-08-01 DIAGNOSIS — R7401 Elevation of levels of liver transaminase levels: Secondary | ICD-10-CM

## 2024-08-01 DIAGNOSIS — Z79899 Other long term (current) drug therapy: Secondary | ICD-10-CM | POA: Diagnosis not present

## 2024-08-01 DIAGNOSIS — K8020 Calculus of gallbladder without cholecystitis without obstruction: Secondary | ICD-10-CM | POA: Diagnosis not present

## 2024-08-01 DIAGNOSIS — R7989 Other specified abnormal findings of blood chemistry: Secondary | ICD-10-CM | POA: Diagnosis not present

## 2024-08-01 DIAGNOSIS — K219 Gastro-esophageal reflux disease without esophagitis: Secondary | ICD-10-CM | POA: Diagnosis present

## 2024-08-01 DIAGNOSIS — M549 Dorsalgia, unspecified: Secondary | ICD-10-CM | POA: Diagnosis present

## 2024-08-01 DIAGNOSIS — K8064 Calculus of gallbladder and bile duct with chronic cholecystitis without obstruction: Secondary | ICD-10-CM | POA: Diagnosis not present

## 2024-08-01 DIAGNOSIS — Z881 Allergy status to other antibiotic agents status: Secondary | ICD-10-CM | POA: Diagnosis not present

## 2024-08-01 DIAGNOSIS — K8309 Other cholangitis: Secondary | ICD-10-CM | POA: Diagnosis not present

## 2024-08-01 DIAGNOSIS — Z7984 Long term (current) use of oral hypoglycemic drugs: Secondary | ICD-10-CM | POA: Diagnosis not present

## 2024-08-01 HISTORY — PX: ERCP: SHX5425

## 2024-08-01 LAB — COMPREHENSIVE METABOLIC PANEL WITH GFR
ALT: 697 U/L — ABNORMAL HIGH (ref 0–44)
AST: 510 U/L — ABNORMAL HIGH (ref 15–41)
Albumin: 3.6 g/dL (ref 3.5–5.0)
Alkaline Phosphatase: 232 U/L — ABNORMAL HIGH (ref 38–126)
Anion gap: 12 (ref 5–15)
BUN: 6 mg/dL (ref 6–20)
CO2: 23 mmol/L (ref 22–32)
Calcium: 8.7 mg/dL — ABNORMAL LOW (ref 8.9–10.3)
Chloride: 100 mmol/L (ref 98–111)
Creatinine, Ser: 0.63 mg/dL (ref 0.44–1.00)
GFR, Estimated: >60 mL/min (ref >60)
Glucose, Bld: 163 mg/dL — ABNORMAL HIGH (ref 70–99)
Potassium: 3.4 mmol/L — ABNORMAL LOW (ref 3.5–5.1)
Sodium: 135 mmol/L (ref 135–145)
Total Bilirubin: 3.2 mg/dL — ABNORMAL HIGH (ref 0.0–1.2)
Total Protein: 6.9 g/dL (ref 6.5–8.1)

## 2024-08-01 LAB — CBC
HCT: 36.7 % (ref 36.0–46.0)
Hemoglobin: 12.4 g/dL (ref 12.0–15.0)
MCH: 29.8 pg (ref 26.0–34.0)
MCHC: 33.8 g/dL (ref 30.0–36.0)
MCV: 88.2 fL (ref 80.0–100.0)
Platelets: 196 K/uL (ref 150–400)
RBC: 4.16 MIL/uL (ref 3.87–5.11)
RDW: 12.8 % (ref 11.5–15.5)
WBC: 17.8 K/uL — ABNORMAL HIGH (ref 4.0–10.5)
nRBC: 0 % (ref 0.0–0.2)

## 2024-08-01 LAB — HEPATITIS PANEL, ACUTE
HCV Ab: NONREACTIVE
Hep A IgM: NONREACTIVE
Hep B C IgM: NONREACTIVE
Hepatitis B Surface Ag: NONREACTIVE

## 2024-08-01 LAB — URINALYSIS, ROUTINE W REFLEX MICROSCOPIC
Bacteria, UA: NONE SEEN
Bilirubin Urine: NEGATIVE
Glucose, UA: NEGATIVE mg/dL
Hgb urine dipstick: NEGATIVE
Ketones, ur: 5 mg/dL — AB
Leukocytes,Ua: NEGATIVE
Nitrite: NEGATIVE
Protein, ur: NEGATIVE mg/dL
Specific Gravity, Urine: 1.042 — ABNORMAL HIGH (ref 1.005–1.030)
pH: 7 (ref 5.0–8.0)

## 2024-08-01 LAB — LIPASE, BLOOD
Lipase: 27 U/L (ref 11–51)
Lipase: 591 U/L — ABNORMAL HIGH (ref 11–51)

## 2024-08-01 LAB — PROTIME-INR
INR: 1.2 (ref 0.8–1.2)
Prothrombin Time: 15.8 s — ABNORMAL HIGH (ref 11.4–15.2)

## 2024-08-01 LAB — HIV ANTIBODY (ROUTINE TESTING W REFLEX): HIV Screen 4th Generation wRfx: NONREACTIVE

## 2024-08-01 LAB — GLUCOSE, CAPILLARY: Glucose-Capillary: 135 mg/dL — ABNORMAL HIGH (ref 70–99)

## 2024-08-01 SURGERY — ERCP, WITH INTERVENTION IF INDICATED
Anesthesia: General

## 2024-08-01 MED ORDER — SODIUM CHLORIDE 0.9 % IV SOLN: INTRAVENOUS | Status: DC

## 2024-08-01 MED ORDER — GLUCAGON HCL RDNA (DIAGNOSTIC) 1 MG IJ SOLR
INTRAMUSCULAR | Status: AC
  Filled 2024-08-01: qty 1

## 2024-08-01 MED ORDER — LACTATED RINGERS IV SOLN: INTRAVENOUS | Status: DC | PRN

## 2024-08-01 MED ORDER — CIPROFLOXACIN IN D5W 400 MG/200ML IV SOLN
400.0000 mg | Freq: Once | INTRAVENOUS | Status: AC
  Administered 2024-08-01: 400 mg via INTRAVENOUS
  Filled 2024-08-01: qty 200

## 2024-08-01 MED ORDER — DICLOFENAC SUPPOSITORY 100 MG: 100.0000 mg | Freq: Once | RECTAL | Status: DC

## 2024-08-01 MED ORDER — INDOMETHACIN 50 MG RE SUPP
RECTAL | Status: AC
  Filled 2024-08-01: qty 1

## 2024-08-01 MED ORDER — FENTANYL CITRATE (PF) 250 MCG/5ML IJ SOLN
INTRAMUSCULAR | Status: DC | PRN
  Administered 2024-08-01: 50 ug via INTRAVENOUS

## 2024-08-01 MED ORDER — SUCCINYLCHOLINE CHLORIDE 200 MG/10ML IV SOSY
PREFILLED_SYRINGE | INTRAVENOUS | Status: DC | PRN
  Administered 2024-08-01: 100 mg via INTRAVENOUS

## 2024-08-01 MED ORDER — INDOMETHACIN 50 MG RE SUPP
RECTAL | Status: DC | PRN
Start: 2024-08-01 — End: 2024-08-01
  Administered 2024-08-01: 100 mg via RECTAL

## 2024-08-01 MED ORDER — OXYCODONE HCL 5 MG PO TABS
5.0000 mg | ORAL_TABLET | Freq: Once | ORAL | Status: DC | PRN
  Filled 2024-08-01: qty 1

## 2024-08-01 MED ORDER — CIPROFLOXACIN IN D5W 400 MG/200ML IV SOLN
400.0000 mg | Freq: Two times a day (BID) | INTRAVENOUS | Status: DC
  Administered 2024-08-01 – 2024-08-03 (×4): 400 mg via INTRAVENOUS
  Filled 2024-08-01 (×4): qty 200

## 2024-08-01 MED ORDER — FENTANYL CITRATE (PF) 100 MCG/2ML IJ SOLN: 25.0000 ug | INTRAMUSCULAR | Status: DC | PRN

## 2024-08-01 MED ORDER — FENTANYL CITRATE PF 50 MCG/ML IJ SOSY
12.5000 ug | PREFILLED_SYRINGE | INTRAMUSCULAR | Status: DC | PRN
  Administered 2024-08-01 – 2024-08-03 (×8): 25 ug via INTRAVENOUS
  Filled 2024-08-01 (×8): qty 1

## 2024-08-01 MED ORDER — SODIUM CHLORIDE 0.9% FLUSH
3.0000 mL | Freq: Two times a day (BID) | INTRAVENOUS | Status: DC
  Administered 2024-08-01 – 2024-08-02 (×4): 3 mL via INTRAVENOUS

## 2024-08-01 MED ORDER — FENTANYL CITRATE (PF) 100 MCG/2ML IJ SOLN
INTRAMUSCULAR | Status: AC
  Filled 2024-08-01: qty 2

## 2024-08-01 MED ORDER — ONDANSETRON HCL 4 MG/2ML IJ SOLN: 4.0000 mg | Freq: Four times a day (QID) | INTRAMUSCULAR | Status: DC | PRN

## 2024-08-01 MED ORDER — SODIUM CHLORIDE 0.9 % IV SOLN: 250.0000 mL | INTRAVENOUS | Status: AC | PRN

## 2024-08-01 MED ORDER — ENOXAPARIN SODIUM 40 MG/0.4ML IJ SOSY: 40.0000 mg | PREFILLED_SYRINGE | INTRAMUSCULAR | Status: DC

## 2024-08-01 MED ORDER — DEXAMETHASONE SODIUM PHOSPHATE 10 MG/ML IJ SOLN
INTRAMUSCULAR | Status: DC | PRN
  Administered 2024-08-01: 5 mg via INTRAVENOUS

## 2024-08-01 MED ORDER — MECLIZINE HCL 25 MG PO TABS: 25.0000 mg | ORAL_TABLET | Freq: Three times a day (TID) | ORAL | Status: DC | PRN

## 2024-08-01 MED ORDER — AMISULPRIDE (ANTIEMETIC) 5 MG/2ML IV SOLN: 10.0000 mg | Freq: Once | INTRAVENOUS | Status: DC | PRN

## 2024-08-01 MED ORDER — SODIUM CHLORIDE 0.9 % IV SOLN
INTRAVENOUS | Status: DC | PRN
  Administered 2024-08-01: 50 mL

## 2024-08-01 MED ORDER — PROPOFOL 10 MG/ML IV BOLUS
INTRAVENOUS | Status: DC | PRN
  Administered 2024-08-01: 150 mg via INTRAVENOUS

## 2024-08-01 MED ORDER — METRONIDAZOLE 500 MG/100ML IV SOLN
500.0000 mg | Freq: Once | INTRAVENOUS | Status: AC
  Administered 2024-08-01: 500 mg via INTRAVENOUS
  Filled 2024-08-01: qty 100

## 2024-08-01 MED ORDER — IBUPROFEN 200 MG PO TABS
400.0000 mg | ORAL_TABLET | Freq: Once | ORAL | Status: AC
  Administered 2024-08-01: 400 mg via ORAL
  Filled 2024-08-01: qty 2

## 2024-08-01 MED ORDER — SUGAMMADEX SODIUM 200 MG/2ML IV SOLN
INTRAVENOUS | Status: DC | PRN
  Administered 2024-08-01: 150 mg via INTRAVENOUS

## 2024-08-01 MED ORDER — GLUCAGON HCL RDNA (DIAGNOSTIC) 1 MG IJ SOLR
INTRAMUSCULAR | Status: DC | PRN
  Administered 2024-08-01: .25 mg via INTRAVENOUS

## 2024-08-01 MED ORDER — SODIUM CHLORIDE 0.9% FLUSH: 3.0000 mL | INTRAVENOUS | Status: DC | PRN

## 2024-08-01 MED ORDER — FENTANYL CITRATE PF 50 MCG/ML IJ SOSY
50.0000 ug | PREFILLED_SYRINGE | Freq: Once | INTRAMUSCULAR | Status: AC
  Administered 2024-08-01: 50 ug via INTRAVENOUS
  Filled 2024-08-01: qty 1

## 2024-08-01 MED ORDER — IOHEXOL 350 MG/ML SOLN
75.0000 mL | Freq: Once | INTRAVENOUS | Status: AC | PRN
  Administered 2024-08-01: 75 mL via INTRAVENOUS

## 2024-08-01 MED ORDER — ONDANSETRON HCL 4 MG/2ML IJ SOLN
INTRAMUSCULAR | Status: DC | PRN
Start: 2024-08-01 — End: 2024-08-01
  Administered 2024-08-01: 4 mg via INTRAVENOUS

## 2024-08-01 MED ORDER — SODIUM CHLORIDE 0.9 % IV SOLN: 12.5000 mg | INTRAVENOUS | Status: DC | PRN

## 2024-08-01 MED ORDER — ONDANSETRON HCL 4 MG PO TABS: 4.0000 mg | ORAL_TABLET | Freq: Four times a day (QID) | ORAL | Status: DC | PRN

## 2024-08-01 MED ORDER — OXYCODONE HCL 5 MG/5ML PO SOLN
5.0000 mg | Freq: Once | ORAL | Status: DC | PRN
  Filled 2024-08-01: qty 5

## 2024-08-01 MED ORDER — ROCURONIUM BROMIDE 10 MG/ML (PF) SYRINGE
PREFILLED_SYRINGE | INTRAVENOUS | Status: DC | PRN
  Administered 2024-08-01: 30 mg via INTRAVENOUS

## 2024-08-01 MED ORDER — METRONIDAZOLE 500 MG/100ML IV SOLN
500.0000 mg | Freq: Two times a day (BID) | INTRAVENOUS | Status: DC
  Administered 2024-08-02 – 2024-08-03 (×3): 500 mg via INTRAVENOUS
  Filled 2024-08-01 (×3): qty 100

## 2024-08-01 MED ORDER — LIDOCAINE 2% (20 MG/ML) 5 ML SYRINGE
INTRAMUSCULAR | Status: DC | PRN
  Administered 2024-08-01: 60 mg via INTRAVENOUS

## 2024-08-01 MED ORDER — IBUPROFEN 200 MG PO TABS
400.0000 mg | ORAL_TABLET | Freq: Four times a day (QID) | ORAL | Status: DC | PRN
  Administered 2024-08-03: 400 mg via ORAL
  Filled 2024-08-01: qty 2

## 2024-08-01 MED ORDER — EPHEDRINE SULFATE-NACL 50-0.9 MG/10ML-% IV SOSY
PREFILLED_SYRINGE | INTRAVENOUS | Status: DC | PRN
  Administered 2024-08-01: 5 mg via INTRAVENOUS

## 2024-08-01 MED ORDER — PHENYLEPHRINE 80 MCG/ML (10ML) SYRINGE FOR IV PUSH (FOR BLOOD PRESSURE SUPPORT)
PREFILLED_SYRINGE | INTRAVENOUS | Status: DC | PRN
  Administered 2024-08-01 (×4): 160 ug via INTRAVENOUS
  Administered 2024-08-01: 80 ug via INTRAVENOUS

## 2024-08-01 MED ORDER — ONDANSETRON HCL 4 MG/2ML IJ SOLN
4.0000 mg | Freq: Once | INTRAMUSCULAR | Status: AC
  Administered 2024-08-01: 4 mg via INTRAVENOUS
  Filled 2024-08-01: qty 2

## 2024-08-01 MED ORDER — CIPROFLOXACIN IN D5W 400 MG/200ML IV SOLN
INTRAVENOUS | Status: AC
  Filled 2024-08-01: qty 200

## 2024-08-01 MED ORDER — SODIUM CHLORIDE 0.9% FLUSH
3.0000 mL | Freq: Two times a day (BID) | INTRAVENOUS | Status: DC
  Administered 2024-08-01 – 2024-08-02 (×5): 3 mL via INTRAVENOUS

## 2024-08-01 MED ORDER — LACTATED RINGERS IV SOLN: INTRAVENOUS | Status: DC

## 2024-08-01 MED ORDER — MIDAZOLAM HCL 2 MG/2ML IJ SOLN
INTRAMUSCULAR | Status: AC
  Filled 2024-08-01: qty 2

## 2024-08-01 NOTE — Anesthesia Preprocedure Evaluation (Addendum)
 Anesthesia Evaluation  Patient identified by MRN, date of birth, ID band Patient awake    Reviewed: Allergy & Precautions, NPO status , Patient's Chart, lab work & pertinent test results  History of Anesthesia Complications Negative for: history of anesthetic complications  Airway Mallampati: II  TM Distance: >3 FB Neck ROM: Full    Dental  (+) Dental Advisory Given, Teeth Intact   Pulmonary asthma    Pulmonary exam normal        Cardiovascular hypertension, Pt. on medications Normal cardiovascular exam     Neuro/Psych  Headaches  Vertigo   Neuromuscular disease  negative psych ROS   GI/Hepatic ,GERD  Controlled and Medicated,, Elevated LFTs    Cholangitis    Endo/Other  diabetes, Type 2, Oral Hypoglycemic Agents    Renal/GU negative Renal ROS     Musculoskeletal negative musculoskeletal ROS (+)    Abdominal   Peds  Hematology negative hematology ROS (+)   Anesthesia Other Findings   Reproductive/Obstetrics                              Anesthesia Physical Anesthesia Plan  ASA: 3  Anesthesia Plan: General   Post-op Pain Management: Minimal or no pain anticipated   Induction: Intravenous  PONV Risk Score and Plan: 3 and Treatment may vary due to age or medical condition, Ondansetron , Dexamethasone  and Midazolam   Airway Management Planned: Oral ETT  Additional Equipment: None  Intra-op Plan:   Post-operative Plan: Extubation in OR  Informed Consent: I have reviewed the patients History and Physical, chart, labs and discussed the procedure including the risks, benefits and alternatives for the proposed anesthesia with the patient or authorized representative who has indicated his/her understanding and acceptance.     Dental advisory given  Plan Discussed with: CRNA and Anesthesiologist  Anesthesia Plan Comments:          Anesthesia Quick Evaluation

## 2024-08-01 NOTE — Transfer of Care (Signed)
 Immediate Anesthesia Transfer of Care Note  Patient: [name]  Procedure(s) Performed: ERCP, WITH INTERVENTION IF INDICATED  Patient Location: PACU  Anesthesia Type:General  Level of Consciousness: awake, alert , and oriented  Airway & Oxygen Therapy: Patient Spontanous Breathing and Patient connected to face mask oxygen  Post-op Assessment: Report given to RN and Post -op Vital signs reviewed and stable  Post vital signs: Reviewed and stable  Last Vitals:  Vitals Value Taken Time  BP 122/75 08/01/24 15:05  Temp 37 C 08/01/24 15:05  Pulse 102 08/01/24 15:07  Resp 15 08/01/24 15:07  SpO2 100 % 08/01/24 15:07  Vitals shown include unfiled device data.  Last Pain:  Vitals:   08/01/24 1505  TempSrc:   PainSc: 0-No pain         Complications: No notable events documented.

## 2024-08-01 NOTE — Anesthesia Postprocedure Evaluation (Signed)
 Anesthesia Post Note  Patient: [name]  Procedure(s) Performed: ERCP, WITH INTERVENTION IF INDICATED     Patient location during evaluation: PACU Anesthesia Type: General Level of consciousness: awake and alert Pain management: pain level controlled Vital Signs Assessment: post-procedure vital signs reviewed and stable Respiratory status: spontaneous breathing, nonlabored ventilation and respiratory function stable Cardiovascular status: stable and blood pressure returned to baseline Anesthetic complications: no   No notable events documented.  Last Vitals:  Vitals:   08/01/24 1530 08/01/24 1535  BP: 118/72 118/72  Pulse: 88 89  Resp: 18 16  Temp:  37 C  SpO2: 95% 96%    Last Pain:  Vitals:   08/01/24 1505  TempSrc:   PainSc: 0-No pain                 Debby FORBES Like

## 2024-08-01 NOTE — Consult Note (Addendum)
 [facility] Gastroenterology Consult Note   History [name] MRN # [id number]  Date of Admission: 07/31/2024 Date of Consultation: 08/01/2024 Referring physician: Dr. Leotis Bogus, MD Primary Care Provider: Vicci Barnie NOVAK, MD Primary Gastroenterologist: None-unassigned   Reason for Consultation/Chief Complaint: Right upper quadrant pain, gallstones, elevated LFTs  Subjective  HPI:  This is a 58 year old woman with no prior GI history who has had at least several months of intermittent right sided abdominal pain for which she reports having seen primary care but did not have any further workup. Yesterday she came to the ED because the pain was escalating severely and rapidly.  LFTs noted to be significantly elevated with a mixed pattern, ultrasound showed a distended gallbladder with both intra and extrahepatic biliary ductal dilatation as well as gallstones with no radiographic evidence of cholecystitis. Her pain continues in a similar fashion today, and the normal WBC on admission has now become significantly elevated.  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 Claritin    Vertigo     Past Surgical History Past Surgical History:  Procedure Laterality Date   ANAL FISTULECTOMY  08/22/2012   Procedure: FISTULECTOMY ANAL;  Surgeon: Redell Faith, DO;  Location: [facility];  Service: General;  Laterality: N/A;  rectal examination under anesthesia with seton placement    ANAL FISTULECTOMY  09/17/2012   Procedure: FISTULECTOMY ANAL;   Surgeon: Redell Faith, DO;  Location: [facility];  Service: General;  Laterality: N/A;  possible fistulotomy   BACK SURGERY  10/19/2022   D & C HYSTEROSCOPY/ NOVASURE ENDOMETRIAL ABLATION/ I & D THROMBOSED HEMORROID  01-10-2007  DR PEGGYE ROSS   EXAMINATION UNDER ANESTHESIA  09/17/2012   Procedure: EXAM UNDER ANESTHESIA;  Surgeon: Redell Faith, DO;  Location: [facility];  Service: General;  Laterality: N/A;  Rectal exam under anesthesia   EXAMINATION UNDER ANESTHESIA N/A 05/14/2013   Procedure: EXAM UNDER ANESTHESIA;  Surgeon: Bernarda Ned, MD;  Location: [facility];  Service: General;  Laterality: N/A;   FISTULA PLUG  09/17/2012   Procedure: FISTULA PLUG;  Surgeon: Redell Faith, DO;  Location: [facility];  Service: General;  Laterality: N/A;  Possible fistula plug   HEMORRHOID SURGERY  04/28/2009   [facility] INTERNAL HEMORRHOIDS   IRRIGATION AND DEBRIDEMENT ABSCESS  07/08/2012   Procedure: MINOR INCISION AND DRAINAGE OF ABSCESS;  Surgeon: Hargis Paradise, MD;  Location: [facility];  Service: Gynecology;  Laterality: N/A;  Vulvar Abscess   PLACEMENT OF SETON N/A 05/14/2013   Procedure:  PLACEMENT OF SETON;  Surgeon: Bernarda Ned, MD;  Location: [facility];  Service: General;  Laterality: N/A;   RECTAL EXAM UNDER ANESTHESIA N/A 08/20/2013   Procedure: mucosal advancement flap ;  Surgeon: Bernarda Ned, MD;  Location: [facility];  Service: General;  Laterality: N/A;  parks anal retractor long rectal instrucments prone jack knife anal fistula    RECTAL ULTRASOUND N/A 07/20/2013   Procedure: RECTAL ULTRASOUND;  Surgeon: Bernarda Ned, MD;  Location: [facility];  Service: Endoscopy;  Laterality: N/A;    Family History Family History  Problem Relation Age of Onset   Diabetes Mother    Hypertension Mother    Heart attack Father    Seizures Sister    Seizures Sister    Diabetes Brother    Hypertension Brother     Diabetes Maternal Aunt    Diabetes Maternal Uncle    Hypertension Maternal Uncle    Hypertension Son    Colon cancer Neg Hx    Stomach cancer Neg Hx     Social History Social History   Socioeconomic History   Marital status: Single    Spouse name: Not on file   Number of children: 2   Years of education: 10   Highest education level: Not on file  Occupational History   Occupation: N/A  Tobacco Use   Smoking status: Never   Smokeless tobacco: Never  Vaping Use   Vaping status: Never Used  Substance and Sexual Activity   Alcohol use: Never   Drug use: No   Sexual activity: Yes  Other Topics Concern   Not on file  Social History Narrative   Lives at home w/ her mother   Right-handed   Caffeine: 1 cup of tea per day   Social Drivers of Health   Financial Resource Strain: Low Risk  (08/23/2023)   Overall Financial Resource Strain (CARDIA)    Difficulty of Paying Living Expenses: Not very hard  Food Insecurity: Food Insecurity Present (08/23/2023)   Hunger Vital Sign    Worried About Running Out of Food in the Last Year: Never true    Ran Out of Food in the Last Year: Sometimes true  Transportation Needs: No Transportation Needs (08/23/2023)   PRAPARE - Administrator, Civil Service (Medical): No    Lack of Transportation (Non-Medical): No  Physical Activity: Insufficiently Active (08/23/2023)   Exercise Vital Sign    Days of Exercise per Week: 5 days    Minutes of Exercise per Session: 20 min  Stress: Stress Concern Present (08/23/2023)   [employer] of Occupational Health - Occupational Stress Questionnaire    Feeling of Stress : To some extent  Social Connections: Moderately Integrated (08/23/2023)   Social Connection and Isolation Panel    Frequency of Communication with Friends and Family: More than three times a week    Frequency of Social Gatherings with Friends and Family: More than three times a week    Attends Religious Services: More  than 4 times per year    Active Member of [employer] or Organizations: Yes    Attends Banker Meetings: Never    Marital Status: Never married    Allergies Allergies  Allergen Reactions   Acetaminophen  Itching and Rash    Other Reaction(s): Not available   Ampicillin Anaphylaxis   Atorvastatin  Calcium  Other (See Comments)    Pt reported lip swelling   Penicillins Anaphylaxis    Other Reaction(s): Not available   Sulfonamide Derivatives Anaphylaxis   Tramadol Itching and Rash   Hydrocodone -Acetaminophen  Hives    Have to take a Benadryl  to take med   Hydrocodone -Acetaminophen  Hives and Rash    Have to take a Benadryl  to take med   Amoxicillin     Other Reaction(s): Not available    Outpatient Meds Home medications from the H+P and/or nursing med reconciliation reviewed.  Inpatient med list reviewed  _____________________________________________________________________ Objective  Exam:  Current vital signs  Patient Vitals for the past 8 hrs:  BP Temp Temp src Pulse Resp SpO2  08/01/24 0808 123/67 (!) 100.5 F (38.1 C) Oral 92 17 94 %  08/01/24 0436 120/71 -- -- 96 -- --  08/01/24 0312 125/63 99.9 F (37.7 C) Oral 96 18 97 %   No intake or output data in the 24 hours ending 08/01/24 1040  Physical Exam: Her sister is at the bedside for my entire visit  General: this is a pleasant, fatigued middle-aged female patient.  Has right upper quadrant pain and ongoing back pain making movement difficult.  She is nontoxic-appearing Eyes: sclera anicteric, no redness ENT: oral mucosa moist without lesions, no cervical or supraclavicular lymphadenopathy, good dentition.  Good neck ROM CV: RRR without murmur, S1/S2, no JVD,, no peripheral edema Resp: clear to auscultation bilaterally, normal RR and effort noted GI: soft, epigastric and right upper quadrant tenderness, with active bowel sounds of normal character. + Guarding, nondistended Skin; warm and dry, no rash or  jaundice noted Neuro: awake, alert and oriented x 3. Normal gross motor function and fluent speech.  Labs:     Latest Ref Rng & Units 08/01/2024    6:13 AM 07/31/2024   10:20 PM 02/25/2024   11:02 AM  CBC  WBC 4.0 - 10.5 K/uL 17.8  9.8  6.7   Hemoglobin 12.0 - 15.0 g/dL 87.5  87.1  87.1   Hematocrit 36.0 - 46.0 % 36.7  39.2  38.2   Platelets 150 - 400 K/uL 196  225  245        Latest Ref Rng & Units 08/01/2024    6:13 AM 07/31/2024   10:20 PM 02/25/2024   11:02 AM  CMP  Glucose 70 - 99 mg/dL 836  872  91   BUN 6 - 20 mg/dL 6  9  8    Creatinine 0.44 - 1.00 mg/dL 9.36  9.17  9.23   Sodium 135 - 145 mmol/L 135  138  141   Potassium 3.5 - 5.1 mmol/L 3.4  3.6  4.2   Chloride 98 - 111 mmol/L 100  101  101   CO2 22 - 32 mmol/L 23  26  24    Calcium  8.9 - 10.3 mg/dL 8.7  9.4  9.4   Total Protein 6.5 - 8.1 g/dL 6.9  7.4  7.1   Total Bilirubin 0.0 - 1.2 mg/dL 3.2  2.1  0.2   Alkaline Phos 38 - 126 U/L 232  259  64   AST 15 - 41 U/L 510  937  20   ALT 0 - 44 U/L 697  852  18     No results for input(s): INR in the last 168 hours. _________________________________________________________ Radiologic studies:  Narrative & Impression  CLINICAL DATA:  Right-sided pain with elevated liver enzymes.   EXAM: ULTRASOUND ABDOMEN LIMITED RIGHT UPPER QUADRANT   COMPARISON:  February 13, 2017   FINDINGS: Gallbladder:   Multiple large, shadowing, mobile echogenic gallstones are seen within the lumen of a distended gallbladder (measures approximately 11.1 cm in length). The largest measures approximately 1.8 cm. There is no evidence of gallbladder wall thickening (2.2 mm). No sonographic Murphy sign noted by sonographer.   Common bile duct:   Diameter: 6.5 mm   Liver:   No focal lesion identified. Mildly increased echogenicity of the liver parenchyma is seen. Portal vein is patent on color Doppler imaging with normal direction of blood flow towards the  liver.   Other: None.    IMPRESSION: 1. Cholelithiasis within a distended gallbladder, without evidence of acute cholecystitis. 2. Hepatic steatosis.     Electronically Signed   By: Suzen Dials M.D.   On: 08/01/2024 02:24   ______________________________________________________ Other studies: Narrative & Impression  EXAM: CT ABDOMEN AND PELVIS WITH CONTRAST 08/01/2024 12:17:24 AM   TECHNIQUE: CT of the abdomen and pelvis was performed with the administration of 75 mL of iohexol  (OMNIPAQUE ) 350 MG/ML injection. Multiplanar reformatted images are provided for review. Automated exposure control, iterative reconstruction, and/or weight-based adjustment of the mA/kV was utilized to reduce the radiation dose to as low as reasonably achievable.   COMPARISON: CT abdomen and pelvis 05/03/2023.   CLINICAL HISTORY: RLQ abdominal pain.   FINDINGS:   LOWER CHEST: Airspace opacities in the bilateral lower lobes may be due to atelectasis or pneumonia.   LIVER: New dilation of the intrahepatic bile ducts.   GALLBLADDER AND BILE DUCTS: Mild distention of the gallbladder. No radiopaque stone. No gallbladder wall thickening or pericholecystic fluid. The common bile duct measures 8 mm.   SPLEEN: No acute abnormality.   PANCREAS: No evidence of pancreatitis. No pancreatic ductal dilation.   ADRENAL GLANDS: No acute abnormality.   KIDNEYS, URETERS AND BLADDER: Nonobstructing left nephrolithiasis. No hydronephrosis. No perinephric or periureteral stranding. Urinary bladder is unremarkable.   GI AND BOWEL: Stomach demonstrates no acute abnormality. Sigmoid diverticulosis without evidence of acute diverticulitis. Normal appendix. There is no bowel obstruction.   PERITONEUM AND RETROPERITONEUM: No ascites. No free air.   VASCULATURE: Aorta is normal in caliber.   LYMPH NODES: No lymphadenopathy.   REPRODUCTIVE ORGANS: No acute abnormality.   BONES AND SOFT TISSUES: No acute osseous  abnormality. Neuro stimulator is present in the right posterior back. No focal soft tissue abnormality.   IMPRESSION: 1. Mildly distended gallbladder with new intra- and extrahepatic biliary dilation compared to CT 02/28 to 12/07/2022. No evidence of acute cholecystitis or radiopaque stone. Recommend correlation with LFTs and consider ultrasound or MRCP for further evaluation. 2. Bilateral lower lobe airspace opacities, which may represent atelectasis or pneumonia.   Electronically signed by: Norman Gatlin MD 08/01/2024 12:58 AM EDT RP Workstation: [id number]    _______________________________________________________ Assessment & Plan  Impression:  Right upper quadrant pain with markedly elevated LFTs, low-grade fever, gallstones and biliary ductal dilatation. This patient has cholangitis, and it is concerning that her WBC has significantly elevated from last evening.  Though there is modest improvement in transaminases and alkaline phosphatase, the bilirubin continues to rise.  There was reportedly some discussion overnight earlier today about the possibility of MRCP and a call out to radiology to ask if that would be feasible with this patient's lumbar pain stimulator device.  However, clinical suspicion for cholangitis and choledocholithiasis is high, so she does not need an MRCP.  She needs an ERCP today, which I have discussed at length with her and her sister.  This would be with Dr. Paticia Bring, our on-call biliary endoscopist for the weekend.  ERCP was described in detail long with risks and benefits and she was agreeable.  The benefits and risks of the planned procedure(s) were described in detail with the patient or (when appropriate) their health care proxy.  Risks were outlined as including, but not limited to, bleeding, infection, perforation, adverse medication reaction leading to cardiac or pulmonary decompensation, pancreatitis (if ERCP).  The limitation of incomplete  mucosal visualization was also discussed.  No guarantees or warranties were  given.  She has had nothing to eat or drink overnight other than a few sips of water hours ago when she took some pain medicine.  She knows to have nothing else by mouth from this point forward because of her procedure planned for today and the need for anesthesia.  This patient has a reported anaphylactic reaction to penicillin derivatives, so she is currently on ciprofloxacin  and metronidazole .  It is concerning that the white count continues to rise despite that, which is all the more reason for the ERCP to be done today to decompress the biliary tree. Should she continue to have persistent leukocytosis or develops signs of sepsis after the ERCP, then imipenem or carbapenem should be administered with a low likelihood of allergic cross-reactivity.  She is not likely coagulopathic, but it can occasionally happen with sepsis so we will get a stat PT/INR as well.   This consultation required a high degree of medical decision making due to the nature and complexity of the acute condition(s) being evaluated as well as the patient's medical comorbidities and the advanced endoscopic procedure planned.  Victory LITTIE Brand III Office: [phone]

## 2024-08-01 NOTE — Care Plan (Signed)
 This 58 years old female with PMH significant for essential hypertension, hyperlipidemia, non-insulin-dependent type 2 diabetes, chronic vertigo, chronic back pain presented in the ED with complaints of right lower quadrant abdominal pain radiating towards the right upper quadrant associated with some nausea.  RUQ ultrasound shows mildly distended gallbladder with intra and extrahepatic biliary ductal dilatation.  No evidence of acute cholecystitis or radiopaque stone. LFTs are significantly elevated.  EDP discussed the case with gastroenterologist who recommended MRCP which cannot be completed since patient has metallic object in in her back.  GI is proceeding with ERCP given leukocytosis.  Patient was seen and examined at bedside,  reports having significant right upper quadrant pain.

## 2024-08-01 NOTE — ED Provider Notes (Signed)
 Patient found to have cholelithiasis and potentially choledocholithiasis Patient is awake alert in no acute distress.  She is not septic appearing. IV antibiotics have been ordered.  Epic message has been sent to Dr. Legrand with GI Discussed with Dr. Sundil for admission Patient with significant penicillin allergy, Cipro  and Flagyl  have been ordered   Midge Golas, MD 08/01/24 [phone]

## 2024-08-01 NOTE — Anesthesia Procedure Notes (Signed)
 Procedure Name: Intubation Date/Time: 08/01/2024 2:10 PM  Performed by: Arica Bevilacqua J, CRNAPre-anesthesia Checklist: Patient identified, Emergency Drugs available, Suction available and Patient being monitored Patient Re-evaluated:Patient Re-evaluated prior to induction Oxygen Delivery Method: Circle System Utilized Preoxygenation: Pre-oxygenation with 100% oxygen Induction Type: IV induction, Rapid sequence and Cricoid Pressure applied Laryngoscope Size: Miller and 3 Tube type: Oral Tube size: 7.5 mm Number of attempts: 1 Airway Equipment and Method: Stylet and Oral airway Placement Confirmation: ETT inserted through vocal cords under direct vision, positive ETCO2 and breath sounds checked- equal and bilateral Secured at: 21 cm Tube secured with: Tape Dental Injury: Teeth and Oropharynx as per pre-operative assessment

## 2024-08-01 NOTE — H&P (Signed)
 History and Physical    [name] FMW:[id number] DOB: Apr 09, 1966 DOA: 07/31/2024  PCP: Vicci Barnie NOVAK, MD   Patient coming from: Home   Chief Complaint:  Chief Complaint  Patient presents with   Abdominal Pain   ED TRIAGE note:  BIB [facility] from home. Right lower quad pain started yesterday, radiating to right upper quad. 10/10 guarding and tenderness to touch. 150mcg fentayl given. Pain 8/10 Lumbar battery in place for disc concerns 20g L 122/74 88HR 112CBG       HPI:  [name] is a 58 y.o. female with medical history significant of essential hypertension hyperlipidemia, non-insulin-dependent DM type II, chronic vertigo and chronic back pain presented to emergency department right lower abdominal pain.   ED Course:  At presentation to ED patient is hemodynamically stable. Lab work, CMP showing elevated AST 937 ALT 852, ALP 259, bilirubin 2.1.  Normal lipase level 27. CBC unremarkable.  Right upper quadrant ultrasound showed mildly distended gallbladder with intra and extrahepatic biliary ductal dilation as compared to previous CT scan.No evidence of acute cholecystitis or radiopaque stone. Recommend correlation with LFTs and consider ultrasound or MRCP for further evaluation. 2. Bilateral lower lobe airspace opacities, which may represent atelectasis or pneumonia.  Ultrasound showed cholelithiasis within a distended gallbladder without evidence of acute cholecystitis.  Dr. Midge consulted and informed gastroenterology Dr. Legrand. Hospitalist has been consulted for further evaluation management of transaminitis secondary to choledocholithiasis.    Significant labs in the ED: Lab Orders         Comprehensive metabolic panel         Lipase, blood         CBC with Diff         Urinalysis, Routine w reflex microscopic -Urine, Clean Catch         Comprehensive metabolic panel         CBC         Hepatitis panel, acute         HIV Antibody (routine testing w  rflx)       Review of Systems:  Review of Systems  Constitutional:  Negative for chills, fever, malaise/fatigue and weight loss.  Respiratory:  Negative for cough and shortness of breath.   Cardiovascular:  Negative for chest pain and palpitations.  Gastrointestinal:  Positive for abdominal pain. Negative for diarrhea, heartburn, nausea and vomiting.  Musculoskeletal:  Negative for myalgias.  Neurological:  Negative for dizziness.  Psychiatric/Behavioral:  The patient is not nervous/anxious.     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 Claritin    Vertigo     Past Surgical History:  Procedure Laterality Date   ANAL FISTULECTOMY  08/22/2012   Procedure: FISTULECTOMY ANAL;  Surgeon: Redell Faith, DO;  Location: [facility];  Service: General;  Laterality: N/A;  rectal examination under anesthesia with seton placement    ANAL FISTULECTOMY  09/17/2012   Procedure: FISTULECTOMY ANAL;  Surgeon: Redell Faith, DO;  Location: [facility];  Service: General;  Laterality: N/A;  possible fistulotomy   BACK SURGERY  10/19/2022   D & C HYSTEROSCOPY/ NOVASURE ENDOMETRIAL ABLATION/ I & D THROMBOSED HEMORROID  01-10-2007  DR PEGGYE ROSS   EXAMINATION UNDER ANESTHESIA  09/17/2012   Procedure: EXAM UNDER  ANESTHESIA;  Surgeon: Redell Faith, DO;  Location: [facility];  Service: General;  Laterality: N/A;  Rectal exam under anesthesia   EXAMINATION UNDER ANESTHESIA N/A 05/14/2013   Procedure: EXAM UNDER ANESTHESIA;  Surgeon: Bernarda Ned, MD;  [facility];  Service: General;  Laterality: N/A;   FISTULA PLUG  09/17/2012   Procedure: FISTULA PLUG;  Surgeon: Redell Faith, DO;  Location: [facility];  Service: General;  Laterality: N/A;  Possible fistula plug    HEMORRHOID SURGERY  04/28/2009   [facility] INTERNAL HEMORRHOIDS   IRRIGATION AND DEBRIDEMENT ABSCESS  07/08/2012   Procedure: MINOR INCISION AND DRAINAGE OF ABSCESS;  Surgeon: Hargis Paradise, MD;  Location: [facility];  Service: Gynecology;  Laterality: N/A;  Vulvar Abscess   PLACEMENT OF SETON N/A 05/14/2013   Procedure:  PLACEMENT OF SETON;  Surgeon: Bernarda Ned, MD;  Location: [facility];  Service: General;  Laterality: N/A;   RECTAL EXAM UNDER ANESTHESIA N/A 08/20/2013   Procedure: mucosal advancement flap ;  Surgeon: Bernarda Ned, MD;  Location: [facility];  Service: General;  Laterality: N/A;  parks anal retractor long rectal instrucments prone jack knife anal fistula    RECTAL ULTRASOUND N/A 07/20/2013   Procedure: RECTAL ULTRASOUND;  Surgeon: Bernarda Ned, MD;  Location: [facility];  Service: Endoscopy;  Laterality: N/A;     reports that she has never smoked. She has never used smokeless tobacco. She reports that she does not drink alcohol and does not use drugs.  Allergies  Allergen Reactions   Acetaminophen  Itching and Rash    Other Reaction(s): Not available   Ampicillin Anaphylaxis   Atorvastatin  Calcium  Other (See Comments)    Pt reported lip swelling   Penicillins Anaphylaxis    Other Reaction(s): Not available   Sulfonamide Derivatives Anaphylaxis   Tramadol Itching and Rash   Hydrocodone -Acetaminophen  Hives    Have to take a Benadryl  to take med   Hydrocodone -Acetaminophen  Hives and Rash    Have to take a Benadryl  to take med   Amoxicillin     Other Reaction(s): Not available    Family History  Problem Relation Age of Onset   Diabetes Mother    Hypertension Mother    Heart attack Father    Seizures Sister    Seizures Sister    Diabetes Brother    Hypertension Brother    Diabetes Maternal Aunt    Diabetes Maternal Uncle    Hypertension Maternal Uncle    Hypertension Son    Colon cancer Neg Hx    Stomach cancer Neg Hx      Prior to Admission medications   Medication Sig Start Date End Date Taking? Authorizing Provider  albuterol  (VENTOLIN  HFA) 108 (90 Base) MCG/ACT inhaler Inhale 2 puffs into the lungs every 6 (six) hours as needed for wheezing or shortness of breath. 04/16/24   Vicci Barnie NOVAK, MD  amLODipine  (NORVASC ) 10 MG tablet Take 1 tablet (10 mg total) by mouth daily. 07/10/24   Vicci Barnie NOVAK, MD  benzonatate  (TESSALON ) 100 MG capsule Take 1 capsule (100 mg total) by mouth 3 (three) times daily. 07/10/24   Vicci Barnie NOVAK, MD  cetirizine  (ZYRTEC ) 10 MG tablet Take 1 tablet (10 mg total) by mouth daily. 02/25/24   Fleming, Zelda W, NP  cyclobenzaprine  (FLEXERIL ) 10 MG tablet Take 10 mg by mouth 3 (three) times daily. 09/13/22   [provider]  diclofenac  sodium (VOLTAREN ) 1 % GEL Apply 2  g topically 4 (four) times daily. 04/01/19   Murray, Alyssa B, PA-C  EPINEPHrine  (EPIPEN  2-PAK) 0.3 mg/0.3 mL IJ SOAJ injection Inject 0.3 mLs (0.3 mg total) into the muscle as needed for anaphylaxis. 04/18/20   Donah Riis A, PA-C  fluticasone  (FLONASE ) 50 MCG/ACT nasal spray Place 1 spray into both nostrils daily. 02/25/24   Fleming, Zelda W, NP  fluticasone  furoate-vilanterol (BREO ELLIPTA ) 100-25 MCG/ACT AEPB Inhale 1 puff into the lungs daily. 07/18/23   Meade Verdon RAMAN, MD  gabapentin  (NEURONTIN ) 300 MG capsule Take 1 capsule (300 mg total) by mouth at bedtime. Patient taking differently: Take 300 mg by mouth 2 (two) times daily. 07/05/20   Vicci Barnie NOVAK, MD  hydrOXYzine  (VISTARIL ) 25 MG capsule TAKE 1 CAPSULE(25 MG) BY MOUTH DAILY AS NEEDED FOR ANXIETY 07/09/24   Vicci Barnie NOVAK, MD  ibuprofen  (ADVIL ) 600 MG tablet Take 1 tablet (600 mg total) by mouth every 8 (eight) hours as needed. 08/23/23   Vicci Barnie NOVAK, MD  LINZESS  290 MCG CAPS capsule TAKE 1 CAPSULE(290 MCG) BY MOUTH DAILY BEFORE BREAKFAST 05/11/24   Vicci Barnie NOVAK, MD  meclizine  (ANTIVERT ) 25 MG tablet TAKE 1 TABLET(25 MG) BY  MOUTH TWICE DAILY AS NEEDED FOR DIZZINESS 02/25/24   Fleming, Zelda W, NP  metFORMIN  (GLUCOPHAGE ) 500 MG tablet Take 1 tablet (500 mg total) by mouth daily with breakfast. 07/10/24   Vicci Barnie NOVAK, MD  montelukast  (SINGULAIR ) 10 MG tablet Take 1 tablet (10 mg total) by mouth at bedtime. 07/18/23   Meade Verdon RAMAN, MD  omeprazole  (PRILOSEC) 20 MG capsule Take 1 capsule (20 mg total) by mouth 2 (two) times daily before a meal. 08/23/23   Vicci Barnie NOVAK, MD  rosuvastatin  (CRESTOR ) 20 MG tablet Take 1 tablet (20 mg total) by mouth daily. 02/25/24   Fleming, Zelda W, NP  triamcinolone  cream (KENALOG ) 0.1 % Apply 1 Application topically 2 (two) times daily. 02/25/24   Theotis Haze ORN, NP     Physical Exam: Vitals:   07/31/24 2000 07/31/24 2200 08/01/24 0014 08/01/24 0312  BP: 116/67 130/77  125/63  Pulse: 81 87  96  Resp:    18  Temp:    99.9 F (37.7 C)  TempSrc:    Oral  SpO2: 100% 96%  97%  Weight:   67 kg   Height:   5' 4 (1.626 m)     Physical Exam Vitals and nursing note reviewed.  Cardiovascular:     Rate and Rhythm: Normal rate.     Heart sounds: Normal heart sounds.  Abdominal:     General: Abdomen is flat.     Palpations: Abdomen is soft.     Tenderness: There is abdominal tenderness in the right upper quadrant. There is no guarding or rebound. Positive signs include Murphy's sign.  Skin:    Capillary Refill: Capillary refill takes less than 2 seconds.  Neurological:     Mental Status: She is oriented to person, place, and time.      Labs on Admission: I have personally reviewed following labs and imaging studies  CBC: Recent Labs  Lab 07/31/24 2220  WBC 9.8  NEUTROABS 8.4*  HGB 12.8  HCT 39.2  MCV 90.5  PLT 225   Basic Metabolic Panel: Recent Labs  Lab 07/31/24 2220  NA 138  K 3.6  CL 101  CO2 26  GLUCOSE 127*  BUN 9  CREATININE 0.82  CALCIUM  9.4   GFR: Estimated Creatinine Clearance: 69.5 mL/min (by  C-G formula based on SCr of 0.82  mg/dL). Liver Function Tests: Recent Labs  Lab 07/31/24 2220  AST 937*  ALT 852*  ALKPHOS 259*  BILITOT 2.1*  PROT 7.4  ALBUMIN 4.0   Recent Labs  Lab 07/31/24 2220  LIPASE 27   No results for input(s): AMMONIA in the last 168 hours. Coagulation Profile: No results for input(s): INR, PROTIME in the last 168 hours. Cardiac Enzymes: No results for input(s): CKTOTAL, CKMB, CKMBINDEX, TROPONINI, TROPONINIHS in the last 168 hours. BNP (last 3 results) No results for input(s): BNP in the last 8760 hours. HbA1C: No results for input(s): HGBA1C in the last 72 hours. CBG: No results for input(s): GLUCAP in the last 168 hours. Lipid Profile: No results for input(s): CHOL, HDL, LDLCALC, TRIG, CHOLHDL, LDLDIRECT in the last 72 hours. Thyroid  Function Tests: No results for input(s): TSH, T4TOTAL, FREET4, T3FREE, THYROIDAB in the last 72 hours. Anemia Panel: No results for input(s): VITAMINB12, FOLATE, FERRITIN, TIBC, IRON, RETICCTPCT in the last 72 hours. Urine analysis:    Component Value Date/Time   COLORURINE YELLOW 08/01/2024 0131   APPEARANCEUR CLEAR 08/01/2024 0131   APPEARANCEUR Clear 02/12/2022 0937   LABSPEC 1.042 (H) 08/01/2024 0131   PHURINE 7.0 08/01/2024 0131   GLUCOSEU NEGATIVE 08/01/2024 0131   HGBUR NEGATIVE 08/01/2024 0131   BILIRUBINUR NEGATIVE 08/01/2024 0131   BILIRUBINUR small (A) 02/29/2024 1036   BILIRUBINUR Negative 02/12/2022 0937   KETONESUR 5 (A) 08/01/2024 0131   PROTEINUR NEGATIVE 08/01/2024 0131   UROBILINOGEN 4.0 (A) 02/29/2024 1036   UROBILINOGEN 0.2 12/01/2014 1752   NITRITE NEGATIVE 08/01/2024 0131   LEUKOCYTESUR NEGATIVE 08/01/2024 0131    Radiological Exams on Admission: I have personally reviewed images US  Abdomen Limited Result Date: 08/01/2024 CLINICAL DATA:  Right-sided pain with elevated liver enzymes. EXAM: ULTRASOUND ABDOMEN LIMITED RIGHT UPPER QUADRANT COMPARISON:  February 13, 2017 FINDINGS: Gallbladder: Multiple large, shadowing, mobile echogenic gallstones are seen within the lumen of a distended gallbladder (measures approximately 11.1 cm in length). The largest measures approximately 1.8 cm. There is no evidence of gallbladder wall thickening (2.2 mm). No sonographic Murphy sign noted by sonographer. Common bile duct: Diameter: 6.5 mm Liver: No focal lesion identified. Mildly increased echogenicity of the liver parenchyma is seen. Portal vein is patent on color Doppler imaging with normal direction of blood flow towards the liver. Other: None. IMPRESSION: 1. Cholelithiasis within a distended gallbladder, without evidence of acute cholecystitis. 2. Hepatic steatosis. Electronically Signed   By: Suzen Dials M.D.   On: 08/01/2024 02:24   CT ABDOMEN PELVIS W CONTRAST Result Date: 08/01/2024 EXAM: CT ABDOMEN AND PELVIS WITH CONTRAST 08/01/2024 12:17:24 AM TECHNIQUE: CT of the abdomen and pelvis was performed with the administration of 75 mL of iohexol  (OMNIPAQUE ) 350 MG/ML injection. Multiplanar reformatted images are provided for review. Automated exposure control, iterative reconstruction, and/or weight-based adjustment of the mA/kV was utilized to reduce the radiation dose to as low as reasonably achievable. COMPARISON: CT abdomen and pelvis 05/03/2023. CLINICAL HISTORY: RLQ abdominal pain. FINDINGS: LOWER CHEST: Airspace opacities in the bilateral lower lobes may be due to atelectasis or pneumonia. LIVER: New dilation of the intrahepatic bile ducts. GALLBLADDER AND BILE DUCTS: Mild distention of the gallbladder. No radiopaque stone. No gallbladder wall thickening or pericholecystic fluid. The common bile duct measures 8 mm. SPLEEN: No acute abnormality. PANCREAS: No evidence of pancreatitis. No pancreatic ductal dilation. ADRENAL GLANDS: No acute abnormality. KIDNEYS, URETERS AND BLADDER: Nonobstructing left nephrolithiasis. No hydronephrosis. No perinephric  or periureteral  stranding. Urinary bladder is unremarkable. GI AND BOWEL: Stomach demonstrates no acute abnormality. Sigmoid diverticulosis without evidence of acute diverticulitis. Normal appendix. There is no bowel obstruction. PERITONEUM AND RETROPERITONEUM: No ascites. No free air. VASCULATURE: Aorta is normal in caliber. LYMPH NODES: No lymphadenopathy. REPRODUCTIVE ORGANS: No acute abnormality. BONES AND SOFT TISSUES: No acute osseous abnormality. Neuro stimulator is present in the right posterior back. No focal soft tissue abnormality. IMPRESSION: 1. Mildly distended gallbladder with new intra- and extrahepatic biliary dilation compared to CT 02/28 to 12/07/2022. No evidence of acute cholecystitis or radiopaque stone. Recommend correlation with LFTs and consider ultrasound or MRCP for further evaluation. 2. Bilateral lower lobe airspace opacities, which may represent atelectasis or pneumonia. Electronically signed by: Norman Gatlin MD 08/01/2024 12:58 AM EDT RP Workstation: [id number]     Assessment/Plan: Principal Problem:   Cholelithiasis with choledocholithiasis Active Problems:   Vertigo   Non-insulin dependent type 2 diabetes mellitus (HCC)   Hyperlipidemia   Essential hypertension    Assessment and Plan: Cholelithiasis with choledocholithiasis Transaminitis -Presented emergency department complaining of right right lower quadrant abdominal pain with radiation to the right upper quadrant.  Reported 10 out of 10 abdominal pain and tenderness on palpation.  In the ED patient received fentanyl  150 micrograms with improvement of pain 8 out of 10. -CMP showing elevated AST 937 ALT 852, ALP 259, bilirubin 2.1.  Normal lipase level 27. CBC unremarkable. -CT abdomen pelvis right upper quadrant ultrasound showed mildly distended gallbladder with intra and extrahepatic biliary ductal dilation as compared to previous CT scan.No evidence of acute cholecystitis or radiopaque stone. Recommend correlation with  LFTs and consider ultrasound or MRCP for further evaluation. Bilateral lower lobe airspace opacities, which may represent atelectasis or pneumonia. - Ultrasound showed cholelithiasis within a distended gallbladder without evidence of acute cholecystitis. -In the ED patient received fentanyl  150 mcg, Zofran , ciprofloxacin  and metronidazole . -Given patient has distended gallbladder and positive Murphy sign on physical exam continue IV antibiotic empirically until being evaluated gastroenterology for further recommendation. -ED physician consulted gastroenterology Dr. Legrand for further evaluation. - Based on CT imaging and hepatic function test concern for cholelithiasis with choledocholithiasis without evidence of acute cholecystitis - Unable to obtain MRCP given patient has metallic implants of lumbar 3 to lumbar 4 vertebrae due to history of disc bulge. -Checking hepatitis panel - Keeping patient n.p.o. as patient will probably will undergo ERCP by GI. -Continue fentanyl  as needed and starting maintenance fluid LR 75 cc/h   Non-insulin-dependent DM type II -Holding metformin  as patient is NPO.  Essential hypertension Holding blood pressure medication as patient is normotensive.  Chronic vertigo -Continue meclizine  as needed   DVT prophylaxis:  Lovenox, SCD Code Status:  Full Code Diet: N.p.o. Family Communication: Family was present at bedside, at the time of interview. Opportunity was given to ask question and all questions were answered satisfactorily.  Disposition Plan: Continue monitor improvement of hepatic function. Consults: Gastroenterology Admission status:   Inpatient, Telemetry bed  Severity of Illness: The appropriate patient status for this patient is INPATIENT. Inpatient status is judged to be reasonable and necessary in order to provide the required intensity of service to ensure the patient's safety. The patient's presenting symptoms, physical exam findings, and initial  radiographic and laboratory data in the context of their chronic comorbidities is felt to place them at high risk for further clinical deterioration. Furthermore, it is not anticipated that the patient will be medically stable for discharge from the hospital within  2 midnights of admission.   * I certify that at the point of admission it is my clinical judgment that the patient will require inpatient hospital care spanning beyond 2 midnights from the point of admission due to high intensity of service, high risk for further deterioration and high frequency of surveillance required.DEWAINE    Marishka Rentfrow, MD [facility]  How to contact the [facility] Attending or Consulting provider 7A - 7P or covering provider during after hours 7P -7A, for this patient.  Check the care team in [facility] and look for a) attending/consulting [facility] provider listed and b) the [facility] team listed Log into [email or web address] and use [place]'s universal password to access. If you do not have the password, please contact the hospital operator. Locate the [facility] provider you are looking for under [facility] and page to a number that you can be directly reached. If you still have difficulty reaching the provider, please page the [facility] (Director on Call) for the Hospitalists listed on amion for assistance.  08/01/2024, 4:05 AM

## 2024-08-01 NOTE — Interval H&P Note (Signed)
 History and Physical Interval Note:  08/01/2024 1:23 PM  [name]  has presented today for surgery, with the diagnosis of Cholangitis.  The various methods of treatment have been discussed with the patient and family. After consideration of risks, benefits and other options for treatment, the patient has consented to  Procedure(s): ERCP, WITH INTERVENTION IF INDICATED (N/A) as a surgical intervention.  The patient's history has been reviewed, patient examined, no change in status, stable for surgery.  I have reviewed the patient's chart and labs.  Questions were answered to the patient's satisfaction.    I have explained risks and benefits including small but definite risks of pancreatitis (<10%), bleeding (<1%), perforation (<1%). The risks of general anesthesia were also discussed by me and anesthesia staff.  The benefits were also discussed.  Patient wishes to proceed.  All the questions were answered.    Lynnie Bring

## 2024-08-01 NOTE — H&P (View-Only) (Signed)
 [facility] Gastroenterology Consult Note   History SHENEIKA WALSTAD MRN # [id number]  Date of Admission: 07/31/2024 Date of Consultation: 08/01/2024 Referring physician: Dr. Leotis Bogus, MD Primary Care Provider: Vicci Barnie NOVAK, MD Primary Gastroenterologist: None-unassigned   Reason for Consultation/Chief Complaint: Right upper quadrant pain, gallstones, elevated LFTs  Subjective  HPI:  This is a 58 year old woman with no prior GI history who has had at least several months of intermittent right sided abdominal pain for which she reports having seen primary care but did not have any further workup. Yesterday she came to the ED because the pain was escalating severely and rapidly.  LFTs noted to be significantly elevated with a mixed pattern, ultrasound showed a distended gallbladder with both intra and extrahepatic biliary ductal dilatation as well as gallstones with no radiographic evidence of cholecystitis. Her pain continues in a similar fashion today, and the normal WBC on admission has now become significantly elevated.  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 Claritin    Vertigo     Past Surgical History Past Surgical History:  Procedure Laterality Date   ANAL FISTULECTOMY  08/22/2012   Procedure: FISTULECTOMY ANAL;  Surgeon: Redell Faith, DO;  Location: [facility];  Service: General;  Laterality: N/A;  rectal examination under anesthesia with seton placement    ANAL FISTULECTOMY  09/17/2012   Procedure: FISTULECTOMY ANAL;   Surgeon: Redell Faith, DO;  Location: [facility];  Service: General;  Laterality: N/A;  possible fistulotomy   BACK SURGERY  10/19/2022   D & C HYSTEROSCOPY/ NOVASURE ENDOMETRIAL ABLATION/ I & D THROMBOSED HEMORROID  01-10-2007  DR PEGGYE ROSS   EXAMINATION UNDER ANESTHESIA  09/17/2012   Procedure: EXAM UNDER ANESTHESIA;  Surgeon: Redell Faith, DO;  Location: [facility];  Service: General;  Laterality: N/A;  Rectal exam under anesthesia   EXAMINATION UNDER ANESTHESIA N/A 05/14/2013   Procedure: EXAM UNDER ANESTHESIA;  Surgeon: Bernarda Ned, MD;  Location: [facility];  Service: General;  Laterality: N/A;   FISTULA PLUG  09/17/2012   Procedure: FISTULA PLUG;  Surgeon: Redell Faith, DO;  Location: [facility];  Service: General;  Laterality: N/A;  Possible fistula plug   HEMORRHOID SURGERY  04/28/2009   [facility] INTERNAL HEMORRHOIDS   IRRIGATION AND DEBRIDEMENT ABSCESS  07/08/2012   Procedure: MINOR INCISION AND DRAINAGE OF ABSCESS;  Surgeon: Hargis Paradise, MD;  Location: [facility];  Service: Gynecology;  Laterality: N/A;  Vulvar Abscess   PLACEMENT OF SETON N/A 05/14/2013   Procedure:  PLACEMENT OF SETON;  Surgeon: Bernarda Ned, MD;  Location: [facility];  Service: General;  Laterality: N/A;   RECTAL EXAM UNDER ANESTHESIA N/A 08/20/2013   Procedure: mucosal advancement flap ;  Surgeon: Bernarda Ned, MD;  Location: [facility];  Service: General;  Laterality: N/A;  parks anal retractor long rectal instrucments prone jack knife anal fistula    RECTAL ULTRASOUND N/A 07/20/2013   Procedure: RECTAL ULTRASOUND;  Surgeon: Bernarda Ned, MD;  Location: [facility];  Service: Endoscopy;  Laterality: N/A;    Family History Family History  Problem Relation Age of Onset   Diabetes Mother    Hypertension Mother    Heart attack Father    Seizures Sister    Seizures Sister    Diabetes Brother    Hypertension Brother     Diabetes Maternal Aunt    Diabetes Maternal Uncle    Hypertension Maternal Uncle    Hypertension Son    Colon cancer Neg Hx    Stomach cancer Neg Hx     Social History Social History   Socioeconomic History   Marital status: Single    Spouse name: Not on file   Number of children: 2   Years of education: 10   Highest education level: Not on file  Occupational History   Occupation: N/A  Tobacco Use   Smoking status: Never   Smokeless tobacco: Never  Vaping Use   Vaping status: Never Used  Substance and Sexual Activity   Alcohol use: Never   Drug use: No   Sexual activity: Yes  Other Topics Concern   Not on file  Social History Narrative   Lives at home w/ her mother   Right-handed   Caffeine: 1 cup of tea per day   Social Drivers of Health   Financial Resource Strain: Low Risk  (08/23/2023)   Overall Financial Resource Strain (CARDIA)    Difficulty of Paying Living Expenses: Not very hard  Food Insecurity: Food Insecurity Present (08/23/2023)   Hunger Vital Sign    Worried About Running Out of Food in the Last Year: Never true    Ran Out of Food in the Last Year: Sometimes true  Transportation Needs: No Transportation Needs (08/23/2023)   PRAPARE - Administrator, Civil Service (Medical): No    Lack of Transportation (Non-Medical): No  Physical Activity: Insufficiently Active (08/23/2023)   Exercise Vital Sign    Days of Exercise per Week: 5 days    Minutes of Exercise per Session: 20 min  Stress: Stress Concern Present (08/23/2023)   [employer] of Occupational Health - Occupational Stress Questionnaire    Feeling of Stress : To some extent  Social Connections: Moderately Integrated (08/23/2023)   Social Connection and Isolation Panel    Frequency of Communication with Friends and Family: More than three times a week    Frequency of Social Gatherings with Friends and Family: More than three times a week    Attends Religious Services: More  than 4 times per year    Active Member of [employer] or Organizations: Yes    Attends Banker Meetings: Never    Marital Status: Never married    Allergies Allergies  Allergen Reactions   Acetaminophen  Itching and Rash    Other Reaction(s): Not available   Ampicillin Anaphylaxis   Atorvastatin  Calcium  Other (See Comments)    Pt reported lip swelling   Penicillins Anaphylaxis    Other Reaction(s): Not available   Sulfonamide Derivatives Anaphylaxis   Tramadol Itching and Rash   Hydrocodone -Acetaminophen  Hives    Have to take a Benadryl  to take med   Hydrocodone -Acetaminophen  Hives and Rash    Have to take a Benadryl  to take med   Amoxicillin     Other Reaction(s): Not available    Outpatient Meds Home medications from the H+P and/or nursing med reconciliation reviewed.  Inpatient med list reviewed  _____________________________________________________________________ Objective  Exam:  Current vital signs  Patient Vitals for the past 8 hrs:  BP Temp Temp src Pulse Resp SpO2  08/01/24 0808 123/67 (!) 100.5 F (38.1 C) Oral 92 17 94 %  08/01/24 0436 120/71 -- -- 96 -- --  08/01/24 0312 125/63 99.9 F (37.7 C) Oral 96 18 97 %   No intake or output data in the 24 hours ending 08/01/24 1040  Physical Exam: Her sister is at the bedside for my entire visit  General: this is a pleasant, fatigued middle-aged female patient.  Has right upper quadrant pain and ongoing back pain making movement difficult.  She is nontoxic-appearing Eyes: sclera anicteric, no redness ENT: oral mucosa moist without lesions, no cervical or supraclavicular lymphadenopathy, good dentition.  Good neck ROM CV: RRR without murmur, S1/S2, no JVD,, no peripheral edema Resp: clear to auscultation bilaterally, normal RR and effort noted GI: soft, epigastric and right upper quadrant tenderness, with active bowel sounds of normal character. + Guarding, nondistended Skin; warm and dry, no rash or  jaundice noted Neuro: awake, alert and oriented x 3. Normal gross motor function and fluent speech.  Labs:     Latest Ref Rng & Units 08/01/2024    6:13 AM 07/31/2024   10:20 PM 02/25/2024   11:02 AM  CBC  WBC 4.0 - 10.5 K/uL 17.8  9.8  6.7   Hemoglobin 12.0 - 15.0 g/dL 87.5  87.1  87.1   Hematocrit 36.0 - 46.0 % 36.7  39.2  38.2   Platelets 150 - 400 K/uL 196  225  245        Latest Ref Rng & Units 08/01/2024    6:13 AM 07/31/2024   10:20 PM 02/25/2024   11:02 AM  CMP  Glucose 70 - 99 mg/dL 836  872  91   BUN 6 - 20 mg/dL 6  9  8    Creatinine 0.44 - 1.00 mg/dL 9.36  9.17  9.23   Sodium 135 - 145 mmol/L 135  138  141   Potassium 3.5 - 5.1 mmol/L 3.4  3.6  4.2   Chloride 98 - 111 mmol/L 100  101  101   CO2 22 - 32 mmol/L 23  26  24    Calcium  8.9 - 10.3 mg/dL 8.7  9.4  9.4   Total Protein 6.5 - 8.1 g/dL 6.9  7.4  7.1   Total Bilirubin 0.0 - 1.2 mg/dL 3.2  2.1  0.2   Alkaline Phos 38 - 126 U/L 232  259  64   AST 15 - 41 U/L 510  937  20   ALT 0 - 44 U/L 697  852  18     No results for input(s): INR in the last 168 hours. _________________________________________________________ Radiologic studies:  Narrative & Impression  CLINICAL DATA:  Right-sided pain with elevated liver enzymes.   EXAM: ULTRASOUND ABDOMEN LIMITED RIGHT UPPER QUADRANT   COMPARISON:  February 13, 2017   FINDINGS: Gallbladder:   Multiple large, shadowing, mobile echogenic gallstones are seen within the lumen of a distended gallbladder (measures approximately 11.1 cm in length). The largest measures approximately 1.8 cm. There is no evidence of gallbladder wall thickening (2.2 mm). No sonographic Murphy sign noted by sonographer.   Common bile duct:   Diameter: 6.5 mm   Liver:   No focal lesion identified. Mildly increased echogenicity of the liver parenchyma is seen. Portal vein is patent on color Doppler imaging with normal direction of blood flow towards the  liver.   Other: None.    IMPRESSION: 1. Cholelithiasis within a distended gallbladder, without evidence of acute cholecystitis. 2. Hepatic steatosis.     Electronically Signed   By: Suzen Dials M.D.   On: 08/01/2024 02:24   ______________________________________________________ Other studies: Narrative & Impression  EXAM: CT ABDOMEN AND PELVIS WITH CONTRAST 08/01/2024 12:17:24 AM   TECHNIQUE: CT of the abdomen and pelvis was performed with the administration of 75 mL of iohexol  (OMNIPAQUE ) 350 MG/ML injection. Multiplanar reformatted images are provided for review. Automated exposure control, iterative reconstruction, and/or weight-based adjustment of the mA/kV was utilized to reduce the radiation dose to as low as reasonably achievable.   COMPARISON: CT abdomen and pelvis 05/03/2023.   CLINICAL HISTORY: RLQ abdominal pain.   FINDINGS:   LOWER CHEST: Airspace opacities in the bilateral lower lobes may be due to atelectasis or pneumonia.   LIVER: New dilation of the intrahepatic bile ducts.   GALLBLADDER AND BILE DUCTS: Mild distention of the gallbladder. No radiopaque stone. No gallbladder wall thickening or pericholecystic fluid. The common bile duct measures 8 mm.   SPLEEN: No acute abnormality.   PANCREAS: No evidence of pancreatitis. No pancreatic ductal dilation.   ADRENAL GLANDS: No acute abnormality.   KIDNEYS, URETERS AND BLADDER: Nonobstructing left nephrolithiasis. No hydronephrosis. No perinephric or periureteral stranding. Urinary bladder is unremarkable.   GI AND BOWEL: Stomach demonstrates no acute abnormality. Sigmoid diverticulosis without evidence of acute diverticulitis. Normal appendix. There is no bowel obstruction.   PERITONEUM AND RETROPERITONEUM: No ascites. No free air.   VASCULATURE: Aorta is normal in caliber.   LYMPH NODES: No lymphadenopathy.   REPRODUCTIVE ORGANS: No acute abnormality.   BONES AND SOFT TISSUES: No acute osseous  abnormality. Neuro stimulator is present in the right posterior back. No focal soft tissue abnormality.   IMPRESSION: 1. Mildly distended gallbladder with new intra- and extrahepatic biliary dilation compared to CT 02/28 to 12/07/2022. No evidence of acute cholecystitis or radiopaque stone. Recommend correlation with LFTs and consider ultrasound or MRCP for further evaluation. 2. Bilateral lower lobe airspace opacities, which may represent atelectasis or pneumonia.   Electronically signed by: Norman Gatlin MD 08/01/2024 12:58 AM EDT RP Workstation: [id number]    _______________________________________________________ Assessment & Plan  Impression:  Right upper quadrant pain with markedly elevated LFTs, low-grade fever, gallstones and biliary ductal dilatation. This patient has cholangitis, and it is concerning that her WBC has significantly elevated from last evening.  Though there is modest improvement in transaminases and alkaline phosphatase, the bilirubin continues to rise.  There was reportedly some discussion overnight earlier today about the possibility of MRCP and a call out to radiology to ask if that would be feasible with this patient's lumbar pain stimulator device.  However, clinical suspicion for cholangitis and choledocholithiasis is high, so she does not need an MRCP.  She needs an ERCP today, which I have discussed at length with her and her sister.  This would be with Dr. Paticia Bring, our on-call biliary endoscopist for the weekend.  ERCP was described in detail long with risks and benefits and she was agreeable.  The benefits and risks of the planned procedure(s) were described in detail with the patient or (when appropriate) their health care proxy.  Risks were outlined as including, but not limited to, bleeding, infection, perforation, adverse medication reaction leading to cardiac or pulmonary decompensation, pancreatitis (if ERCP).  The limitation of incomplete  mucosal visualization was also discussed.  No guarantees or warranties were  given.  She has had nothing to eat or drink overnight other than a few sips of water hours ago when she took some pain medicine.  She knows to have nothing else by mouth from this point forward because of her procedure planned for today and the need for anesthesia.  This patient has a reported anaphylactic reaction to penicillin derivatives, so she is currently on ciprofloxacin  and metronidazole .  It is concerning that the white count continues to rise despite that, which is all the more reason for the ERCP to be done today to decompress the biliary tree. Should she continue to have persistent leukocytosis or develops signs of sepsis after the ERCP, then imipenem or carbapenem should be administered with a low likelihood of allergic cross-reactivity.  She is not likely coagulopathic, but it can occasionally happen with sepsis so we will get a stat PT/INR as well.   This consultation required a high degree of medical decision making due to the nature and complexity of the acute condition(s) being evaluated as well as the patient's medical comorbidities and the advanced endoscopic procedure planned.  Victory LITTIE Brand III Office: [phone]

## 2024-08-01 NOTE — Op Note (Signed)
 [facility] Patient Name: [name] Procedure Date : 08/01/2024 MRN: [id number] Attending MD: Lynnie Bring , MD, [id number] Date of Birth: 1966/10/14 CSN: [id number] Age: 58 Admit Type: Inpatient Procedure:                ERCP Indications:              Suspected ascending cholangitis with bile duct                            stone(s), abnormal LFTs. Ultrasound showing                            cholelithiasis. Providers:                Lynnie Bring, MD, Haskel Chris, Technician,                            Robie Breed, RN Referring MD:              Medicines:                General Anesthesia, Indomethacin 100 mg PR, Cipro                             400 mg IV, glucagon 0.25 mg IV Complications:            No immediate complications. Estimated Blood Loss:     Estimated blood loss: none. Procedure:                Pre-Anesthesia Assessment:                           - Prior to the procedure, a History and Physical                            was performed, and patient medications and                            allergies were reviewed. The patient's tolerance of                            previous anesthesia was also reviewed. The risks                            and benefits of the procedure and the sedation                            options and risks were discussed with the patient.                            All questions were answered, and informed consent                            was obtained. Prior Anticoagulants: The patient has                            taken no  anticoagulant or antiplatelet agents. ASA                            Grade Assessment: II - A patient with mild systemic                            disease. After reviewing the risks and benefits,                            the patient was deemed in satisfactory condition to                            undergo the procedure.                           After obtaining informed consent, the scope  was                            passed under direct vision. Throughout the                            procedure, the patient's blood pressure, pulse, and                            oxygen saturations were monitored continuously. The                            [id number]-[id number] ([id number] Olympus duodenoscope was                            introduced through the mouth, and used to inject                            contrast into and used to inject contrast into the                            bile duct. The ERCP was accomplished without                            difficulty. The patient tolerated the procedure                            well. Scope In: Scope Out: Findings:      The scout film was normal. Spinal stimulator was visualized. The       esophagus was successfully intubated under direct vision. The scope was       advanced to a normal major papilla in the descending duodenum without       detailed examination of the pharynx, larynx and associated structures,       and upper GI tract. The upper GI tract was grossly normal.      The bile duct was deeply cannulated using a wire-guided approach on       first attempt. Contrast was injected. I personally interpreted the bile       duct images. There was brisk  flow of contrast through the ducts. Image       quality was adequate. Contrast extended to the entire biliary tree.      CBD measured 10 mm with a small filling defect in the mid CBD measuring       approximately 6 mm. Cystic duct was patent. Few filling defects in the       gallbladder consistent with cholelithiasis. A biliary sphincterotomy was       made with a monofilament traction (standard) sphincterotome using ERBE       electrocautery on endocut mode at 12 o'clock position. There was no       post-sphincterotomy bleeding. The biliary tree was swept with a 12 mm       balloon starting at the bifurcation. A small stone and sludge was swept       from the duct. All stones were removed.  Postocclusion cholangiogram did       not reveal any residual filling defects. Bile was green in color. No pus.      PD was intentionally not cannulated or injected.      Total fluoroscopy time: 27 seconds Impression:               - Choledocholithiasis was found. Complete removal                            was accomplished by biliary sphincterotomy and                            balloon extraction.                           - Cholelithiasis with patent but long cystic duct. Recommendation:           - Return patient to hospital ward for ongoing care.                           - Clear liquid diet today.                           - Recommend surgical consultation for lap chole.                           - Continue Cipro  for total of 5 days.                           - Watch for pancreatitis, bleeding, perforation,                            and cholangitis.                           - The findings and recommendations were discussed                            with the patient. Procedure Code(s):        --- Professional ---                           [phone], Endoscopic retrograde  cholangiopancreatography (ERCP); with removal of                            calculi/debris from biliary/pancreatic duct(s)                           [phone], Endoscopic retrograde                            cholangiopancreatography (ERCP); with                            sphincterotomy/papillotomy                           [phone], Endoscopic catheterization of the biliary                            ductal system, radiological supervision and                            interpretation Diagnosis Code(s):        --- Professional ---                           K80.50, Calculus of bile duct without cholangitis                            or cholecystitis without obstruction CPT copyright 2022 American Medical Association. All rights reserved. The codes documented in this report are preliminary and  upon coder review may  be revised to meet current compliance requirements. Lynnie Bring, MD 08/01/2024 2:59:59 PM This report has been signed electronically. Number of Addenda: 0

## 2024-08-02 ENCOUNTER — Encounter (HOSPITAL_COMMUNITY): Payer: Self-pay | Admitting: Internal Medicine

## 2024-08-02 ENCOUNTER — Other Ambulatory Visit: Payer: Self-pay

## 2024-08-02 ENCOUNTER — Inpatient Hospital Stay (HOSPITAL_COMMUNITY): Admitting: Certified Registered Nurse Anesthetist

## 2024-08-02 ENCOUNTER — Encounter (HOSPITAL_COMMUNITY): Admission: EM | Disposition: A | Payer: Self-pay | Source: Home / Self Care | Attending: Family Medicine

## 2024-08-02 DIAGNOSIS — K8064 Calculus of gallbladder and bile duct with chronic cholecystitis without obstruction: Secondary | ICD-10-CM | POA: Diagnosis not present

## 2024-08-02 DIAGNOSIS — K8309 Other cholangitis: Secondary | ICD-10-CM

## 2024-08-02 DIAGNOSIS — R1011 Right upper quadrant pain: Secondary | ICD-10-CM | POA: Diagnosis not present

## 2024-08-02 DIAGNOSIS — K8070 Calculus of gallbladder and bile duct without cholecystitis without obstruction: Secondary | ICD-10-CM | POA: Diagnosis not present

## 2024-08-02 HISTORY — PX: CHOLECYSTECTOMY: SHX55

## 2024-08-02 HISTORY — PX: INDOCYANINE GREEN FLUORESCENCE IMAGING (ICG): SHX7595

## 2024-08-02 LAB — CBC
HCT: 36.7 % (ref 36.0–46.0)
Hemoglobin: 12.3 g/dL (ref 12.0–15.0)
MCH: 29.6 pg (ref 26.0–34.0)
MCHC: 33.5 g/dL (ref 30.0–36.0)
MCV: 88.2 fL (ref 80.0–100.0)
Platelets: 199 K/uL (ref 150–400)
RBC: 4.16 MIL/uL (ref 3.87–5.11)
RDW: 13.2 % (ref 11.5–15.5)
WBC: 17.1 K/uL — ABNORMAL HIGH (ref 4.0–10.5)
nRBC: 0 % (ref 0.0–0.2)

## 2024-08-02 LAB — COMPREHENSIVE METABOLIC PANEL WITH GFR
ALT: 503 U/L — ABNORMAL HIGH (ref 0–44)
AST: 173 U/L — ABNORMAL HIGH (ref 15–41)
Albumin: 3.4 g/dL — ABNORMAL LOW (ref 3.5–5.0)
Alkaline Phosphatase: 205 U/L — ABNORMAL HIGH (ref 38–126)
Anion gap: 10 (ref 5–15)
BUN: 10 mg/dL (ref 6–20)
CO2: 28 mmol/L (ref 22–32)
Calcium: 9.2 mg/dL (ref 8.9–10.3)
Chloride: 102 mmol/L (ref 98–111)
Creatinine, Ser: 0.74 mg/dL (ref 0.44–1.00)
GFR, Estimated: >60 mL/min (ref >60)
Glucose, Bld: 113 mg/dL — ABNORMAL HIGH (ref 70–99)
Potassium: 4.2 mmol/L (ref 3.5–5.1)
Sodium: 140 mmol/L (ref 135–145)
Total Bilirubin: 4.4 mg/dL — ABNORMAL HIGH (ref 0.0–1.2)
Total Protein: 6.9 g/dL (ref 6.5–8.1)

## 2024-08-02 LAB — GLUCOSE, CAPILLARY
Glucose-Capillary: 104 mg/dL — ABNORMAL HIGH (ref 70–99)
Glucose-Capillary: 116 mg/dL — ABNORMAL HIGH (ref 70–99)

## 2024-08-02 LAB — PHOSPHORUS: Phosphorus: 3.8 mg/dL (ref 2.5–4.6)

## 2024-08-02 LAB — MRSA NEXT GEN BY PCR, NASAL: MRSA by PCR Next Gen: NOT DETECTED

## 2024-08-02 LAB — MAGNESIUM: Magnesium: 1.8 mg/dL (ref 1.7–2.4)

## 2024-08-02 SURGERY — LAPAROSCOPIC CHOLECYSTECTOMY
Anesthesia: General

## 2024-08-02 MED ORDER — 0.9 % SODIUM CHLORIDE (POUR BTL) OPTIME
TOPICAL | Status: DC | PRN
  Administered 2024-08-02: 1000 mL

## 2024-08-02 MED ORDER — FENTANYL CITRATE (PF) 100 MCG/2ML IJ SOLN
INTRAMUSCULAR | Status: AC
  Filled 2024-08-02: qty 2

## 2024-08-02 MED ORDER — OXYCODONE HCL 5 MG PO TABS: 5.0000 mg | ORAL_TABLET | ORAL | Status: DC | PRN

## 2024-08-02 MED ORDER — FENTANYL CITRATE (PF) 100 MCG/2ML IJ SOLN
25.0000 ug | INTRAMUSCULAR | Status: DC | PRN
  Administered 2024-08-02 (×3): 50 ug via INTRAVENOUS

## 2024-08-02 MED ORDER — PHENYLEPHRINE 80 MCG/ML (10ML) SYRINGE FOR IV PUSH (FOR BLOOD PRESSURE SUPPORT)
PREFILLED_SYRINGE | INTRAVENOUS | Status: DC | PRN
  Administered 2024-08-02 (×2): 80 ug via INTRAVENOUS
  Administered 2024-08-02: 160 ug via INTRAVENOUS

## 2024-08-02 MED ORDER — BUPIVACAINE-EPINEPHRINE (PF) 0.25% -1:200000 IJ SOLN
INTRAMUSCULAR | Status: AC
  Filled 2024-08-02: qty 30

## 2024-08-02 MED ORDER — BUPIVACAINE-EPINEPHRINE (PF) 0.25% -1:200000 IJ SOLN
INTRAMUSCULAR | Status: DC | PRN
  Administered 2024-08-02: 8 mL

## 2024-08-02 MED ORDER — DEXAMETHASONE SODIUM PHOSPHATE 10 MG/ML IJ SOLN
INTRAMUSCULAR | Status: DC | PRN
  Administered 2024-08-02: 10 mg via INTRAVENOUS

## 2024-08-02 MED ORDER — SODIUM CHLORIDE 0.9 % IR SOLN
Status: DC | PRN
  Administered 2024-08-02: 200 mL

## 2024-08-02 MED ORDER — ROCURONIUM BROMIDE 10 MG/ML (PF) SYRINGE
PREFILLED_SYRINGE | INTRAVENOUS | Status: AC
  Filled 2024-08-02: qty 10

## 2024-08-02 MED ORDER — ONDANSETRON HCL 4 MG/2ML IJ SOLN
INTRAMUSCULAR | Status: DC | PRN
  Administered 2024-08-02: 4 mg via INTRAVENOUS

## 2024-08-02 MED ORDER — OXYCODONE HCL 5 MG/5ML PO SOLN: 5.0000 mg | Freq: Once | ORAL | Status: AC | PRN

## 2024-08-02 MED ORDER — DEXAMETHASONE SODIUM PHOSPHATE 10 MG/ML IJ SOLN
INTRAMUSCULAR | Status: AC
  Filled 2024-08-02: qty 1

## 2024-08-02 MED ORDER — ONDANSETRON HCL 4 MG/2ML IJ SOLN: 4.0000 mg | Freq: Once | INTRAMUSCULAR | Status: DC | PRN

## 2024-08-02 MED ORDER — ORAL CARE MOUTH RINSE: 15.0000 mL | Freq: Once | OROMUCOSAL | Status: AC

## 2024-08-02 MED ORDER — ONDANSETRON HCL 4 MG/2ML IJ SOLN
INTRAMUSCULAR | Status: AC
Start: 2024-08-02 — End: 2024-08-02
  Filled 2024-08-02: qty 2

## 2024-08-02 MED ORDER — ROCURONIUM BROMIDE 10 MG/ML (PF) SYRINGE
PREFILLED_SYRINGE | INTRAVENOUS | Status: DC | PRN
  Administered 2024-08-02: 70 mg via INTRAVENOUS

## 2024-08-02 MED ORDER — OXYCODONE HCL 5 MG PO TABS
ORAL_TABLET | ORAL | Status: AC
  Filled 2024-08-02: qty 1

## 2024-08-02 MED ORDER — FENTANYL CITRATE (PF) 250 MCG/5ML IJ SOLN
INTRAMUSCULAR | Status: DC | PRN
  Administered 2024-08-02 (×3): 50 ug via INTRAVENOUS

## 2024-08-02 MED ORDER — LACTATED RINGERS IV SOLN
INTRAVENOUS | Status: DC
Start: 2024-08-02 — End: 2024-08-02

## 2024-08-02 MED ORDER — PROPOFOL 10 MG/ML IV BOLUS
INTRAVENOUS | Status: AC
  Filled 2024-08-02: qty 20

## 2024-08-02 MED ORDER — CHLORHEXIDINE GLUCONATE 0.12 % MT SOLN
OROMUCOSAL | Status: AC
  Filled 2024-08-02: qty 15

## 2024-08-02 MED ORDER — LIDOCAINE 2% (20 MG/ML) 5 ML SYRINGE
INTRAMUSCULAR | Status: AC
Start: 2024-08-02 — End: 2024-08-02
  Filled 2024-08-02: qty 10

## 2024-08-02 MED ORDER — ATROPINE SULFATE 0.4 MG/ML IV SOLN
INTRAVENOUS | Status: AC
Start: 2024-08-02 — End: 2024-08-02
  Filled 2024-08-02: qty 1

## 2024-08-02 MED ORDER — FENTANYL CITRATE (PF) 250 MCG/5ML IJ SOLN
INTRAMUSCULAR | Status: AC
  Filled 2024-08-02: qty 5

## 2024-08-02 MED ORDER — INSULIN ASPART 100 UNIT/ML IJ SOLN: 0.0000 [IU] | INTRAMUSCULAR | Status: DC | PRN

## 2024-08-02 MED ORDER — PROPOFOL 10 MG/ML IV BOLUS
INTRAVENOUS | Status: DC | PRN
  Administered 2024-08-02: 120 mg via INTRAVENOUS

## 2024-08-02 MED ORDER — MIDAZOLAM HCL 2 MG/2ML IJ SOLN
INTRAMUSCULAR | Status: DC | PRN
  Administered 2024-08-02: 2 mg via INTRAVENOUS

## 2024-08-02 MED ORDER — CHLORHEXIDINE GLUCONATE 0.12 % MT SOLN
15.0000 mL | Freq: Once | OROMUCOSAL | Status: AC
  Administered 2024-08-02: 15 mL via OROMUCOSAL

## 2024-08-02 MED ORDER — OXYCODONE HCL 5 MG PO TABS
5.0000 mg | ORAL_TABLET | Freq: Once | ORAL | Status: AC | PRN
  Administered 2024-08-02: 5 mg via ORAL

## 2024-08-02 MED ORDER — MIDAZOLAM HCL 2 MG/2ML IJ SOLN
INTRAMUSCULAR | Status: AC
  Filled 2024-08-02: qty 2

## 2024-08-02 MED ORDER — ACETAMINOPHEN 10 MG/ML IV SOLN: 1000.0000 mg | Freq: Once | INTRAVENOUS | Status: DC | PRN

## 2024-08-02 MED ORDER — LIDOCAINE 2% (20 MG/ML) 5 ML SYRINGE
INTRAMUSCULAR | Status: DC | PRN
  Administered 2024-08-02: 100 mg via INTRAVENOUS

## 2024-08-02 MED ORDER — SUGAMMADEX SODIUM 200 MG/2ML IV SOLN
INTRAVENOUS | Status: DC | PRN
Start: 2024-08-02 — End: 2024-08-02
  Administered 2024-08-02: 200 mg via INTRAVENOUS

## 2024-08-02 MED ORDER — INDOCYANINE GREEN 25 MG IV SOLR
2.5000 mg | Freq: Once | INTRAVENOUS | Status: AC
  Administered 2024-08-02: 2.5 mg via INTRAVENOUS
  Filled 2024-08-02: qty 10

## 2024-08-02 MED ORDER — PHENYLEPHRINE 80 MCG/ML (10ML) SYRINGE FOR IV PUSH (FOR BLOOD PRESSURE SUPPORT)
PREFILLED_SYRINGE | INTRAVENOUS | Status: AC
Start: 2024-08-02 — End: 2024-08-02
  Filled 2024-08-02: qty 10

## 2024-08-02 SURGICAL SUPPLY — 32 items
BAG COUNTER SPONGE SURGICOUNT (BAG) ×2 IMPLANT
CANISTER SUCTION 3000ML PPV (SUCTIONS) ×2 IMPLANT
CHLORAPREP W/TINT 26 (MISCELLANEOUS) ×2 IMPLANT
CLIP LIGATING HEMO O LOK GREEN (MISCELLANEOUS) ×2 IMPLANT
COVER SURGICAL LIGHT HANDLE (MISCELLANEOUS) ×2 IMPLANT
DERMABOND ADVANCED .7 DNX12 (GAUZE/BANDAGES/DRESSINGS) ×2 IMPLANT
ELECTRODE REM PT RTRN 9FT ADLT (ELECTROSURGICAL) ×2 IMPLANT
GLOVE BIO SURGEON STRL SZ7.5 (GLOVE) ×4 IMPLANT
GOWN STRL REUS W/ TWL LRG LVL3 (GOWN DISPOSABLE) ×4 IMPLANT
GOWN STRL REUS W/ TWL XL LVL3 (GOWN DISPOSABLE) ×2 IMPLANT
GRASPER SUT TROCAR 14GX15 (MISCELLANEOUS) ×2 IMPLANT
IRRIGATION SUCT STRKRFLW 2 WTP (MISCELLANEOUS) ×2 IMPLANT
KIT BASIN OR (CUSTOM PROCEDURE TRAY) ×2 IMPLANT
KIT TURNOVER KIT B (KITS) ×2 IMPLANT
NDL INSUFFLATION 14GA 120MM (NEEDLE) ×2 IMPLANT
NEEDLE INSUFFLATION 14GA 120MM (NEEDLE) ×1 IMPLANT
PAD ARMBOARD POSITIONER FOAM (MISCELLANEOUS) ×2 IMPLANT
POUCH RETRIEVAL ECOSAC 10 (ENDOMECHANICALS) IMPLANT
SCISSORS LAP 5X35 DISP (ENDOMECHANICALS) ×2 IMPLANT
SET TUBE SMOKE EVAC HIGH FLOW (TUBING) ×2 IMPLANT
SLEEVE Z-THREAD 5X100MM (TROCAR) ×2 IMPLANT
SOLN 0.9% NACL 1000 ML (IV SOLUTION) ×1 IMPLANT
SOLN 0.9% NACL POUR BTL 1000ML (IV SOLUTION) ×2 IMPLANT
SOLN STERILE WATER 1000 ML (IV SOLUTION) ×1 IMPLANT
SOLN STERILE WATER BTL 1000 ML (IV SOLUTION) ×2 IMPLANT
SPECIMEN JAR SMALL (MISCELLANEOUS) ×2 IMPLANT
SUT MNCRL AB 4-0 PS2 18 (SUTURE) ×2 IMPLANT
TOWEL GREEN STERILE (TOWEL DISPOSABLE) ×2 IMPLANT
TRAY LAPAROSCOPIC MC (CUSTOM PROCEDURE TRAY) ×2 IMPLANT
TROCAR 11X100 Z THREAD (TROCAR) ×2 IMPLANT
TROCAR Z-THREAD OPTICAL 5X100M (TROCAR) ×2 IMPLANT
WARMER LAPAROSCOPE (MISCELLANEOUS) ×2 IMPLANT

## 2024-08-02 NOTE — Anesthesia Postprocedure Evaluation (Signed)
 Anesthesia Post Note  Patient: [name]  Procedure(s) Performed: LAPAROSCOPIC CHOLECYSTECTOMY INDOCYANINE GREEN FLUORESCENCE IMAGING (ICG)     Patient location during evaluation: PACU Anesthesia Type: General Level of consciousness: awake and alert Pain management: pain level controlled Vital Signs Assessment: post-procedure vital signs reviewed and stable Respiratory status: spontaneous breathing, nonlabored ventilation, respiratory function stable and patient connected to nasal cannula oxygen Cardiovascular status: blood pressure returned to baseline and stable Postop Assessment: no apparent nausea or vomiting Anesthetic complications: no   No notable events documented.  Last Vitals:  Vitals:   08/02/24 1242 08/02/24 1349  BP: 133/81 132/81  Pulse: 89 90  Resp: 20   Temp: 36.7 C 36.9 C  SpO2: 97% 97%    Last Pain:  Vitals:   08/02/24 1359  TempSrc:   PainSc: 6                  [name]

## 2024-08-02 NOTE — Plan of Care (Signed)

## 2024-08-02 NOTE — Transfer of Care (Signed)
 Immediate Anesthesia Transfer of Care Note  Patient: [name]  Procedure(s) Performed: LAPAROSCOPIC CHOLECYSTECTOMY INDOCYANINE GREEN FLUORESCENCE IMAGING (ICG)  Patient Location: PACU  Anesthesia Type:General  Level of Consciousness: drowsy  Airway & Oxygen Therapy: Patient Spontanous Breathing  Post-op Assessment: Report given to RN and Post -op Vital signs reviewed and stable  Post vital signs: Reviewed and stable  Last Vitals:  Vitals Value Taken Time  BP 128/82 08/02/24 11:51  Temp 36.9 C 08/02/24 11:50  Pulse 86 08/02/24 11:53  Resp 18 08/02/24 11:53  SpO2 95 % 08/02/24 11:53  Vitals shown include unfiled device data.  Last Pain:  Vitals:   08/02/24 1011  TempSrc: Oral  PainSc:          Complications: No notable events documented.

## 2024-08-02 NOTE — Anesthesia Preprocedure Evaluation (Signed)
 Anesthesia Evaluation  Patient identified by MRN, date of birth, ID band Patient awake    Reviewed: Allergy & Precautions, NPO status , Patient's Chart, lab work & pertinent test results, reviewed documented beta blocker date and time   History of Anesthesia Complications Negative for: history of anesthetic complications  Airway Mallampati: III  TM Distance: >3 FB   Mouth opening: Limited Mouth Opening  Dental no notable dental hx.    Pulmonary neg COPD   breath sounds clear to auscultation       Cardiovascular hypertension, (-) CAD, (-) Past MI and (-) Cardiac Stents  Rhythm:Regular Rate:Normal     Neuro/Psych  Headaches  Neuromuscular disease    GI/Hepatic ,GERD  ,,cholecystitis   Endo/Other  diabetes, Type 2    Renal/GU      Musculoskeletal   Abdominal   Peds  Hematology  (+) Blood dyscrasia, anemia   Anesthesia Other Findings   Reproductive/Obstetrics                              Anesthesia Physical Anesthesia Plan  ASA: 2  Anesthesia Plan: General   Post-op Pain Management:    Induction: Intravenous  PONV Risk Score and Plan: 2 and Ondansetron  and Dexamethasone   Airway Management Planned: Oral ETT  Additional Equipment:   Intra-op Plan:   Post-operative Plan: Extubation in OR  Informed Consent: I have reviewed the patients History and Physical, chart, labs and discussed the procedure including the risks, benefits and alternatives for the proposed anesthesia with the patient or authorized representative who has indicated his/her understanding and acceptance.     Dental advisory given  Plan Discussed with: CRNA  Anesthesia Plan Comments:         Anesthesia Quick Evaluation

## 2024-08-02 NOTE — Op Note (Signed)
 08/02/2024  11:40 AM  PATIENT:  [name]  58 y.o. female  PRE-OPERATIVE DIAGNOSIS:  Cholecystitis  POST-OPERATIVE DIAGNOSIS:  choleliathiasis and choledocholithiasis  PROCEDURE:  Procedure(s): LAPAROSCOPIC CHOLECYSTECTOMY (N/A) INDOCYANINE GREEN FLUORESCENCE IMAGING (ICG) (N/A)  SURGEON:  Surgeons and Role:    DEWAINE Rubin Calamity, MD - Primary  ASSISTANTS: none   ANESTHESIA:   local and general  EBL:  3 mL   BLOOD ADMINISTERED:none  DRAINS: none   LOCAL MEDICATIONS USED:  MARCAINE      SPECIMEN:  Source of Specimen:  gallbaldder  DISPOSITION OF SPECIMEN:  PATHOLOGY  COUNTS:  YES  TOURNIQUET:  * No tourniquets in log *  DICTATION: .Dragon Dictation   EBL: <5cc   Complications: none   Counts: reported as correct x 2   Findings:chronically inflamed gallbladder and large gallstones  Indications for procedure: Pt is a 57 with RUQ pain and seen to have gallstones and stone within the CBD.  Pt underwent ERCP and clearance of her CBD  Details of the procedure: The patient was taken to the operating and placed in the supine position with bilateral SCDs in place. A time out was called and all facts were verified. A pneumoperitoneum was obtained via A Veress needle technique to a pressure of 14mm of mercury. A 5mm trochar was then placed in the right upper quadrant under visualization, and there were no injuries to any abdominal organs. A 11 mm port was then placed in the umbilical region after infiltrating with local anesthesia under direct visualization. A second epigastric port was placed under direct visualization.   The gallbladder was identified and retracted, the peritoneum was then sharply dissected from the gallbladder and this dissection was carried down to Calot's triangle. The cystic duct was identified and dissected circumferentially and seen going into the gallbladder 360.  The cystic artery was dissected away from the surrounding tissues.   The critical angle  was obtained.  2 clips were placed proximally one distally and the cystic duct transected. The cystic artery was identified and 2 clips placed proximally and one distally and transected. We then proceeded to remove the gallbladder off the hepatic fossa with Bovie cautery. A retrieval bag was then placed in the abdomen and gallbladder placed in the bag. The hepatic fossa was then reexamined and hemostasis was achieved with Bovie cautery and was excellent at this portion of the case. The subhepatic fossa and perihepatic fossa was then irrigated until the effluent was clear. The specimen bag and specimen were removed from the abdominal cavity.  The 11 mm trocar fascia was reapproximated with the Endo Close #1 Vicryl x3. The pneumoperitoneum was evacuated and all trochars removed under direct visulalization. The skin was then closed with 4-0 Monocryl and the skin dressed with Dermabond. The patient was awaken from general anesthesia and taken to the recovery room in stable condition.   PLAN OF CARE: Admit for overnight observation  PATIENT DISPOSITION:  PACU - hemodynamically stable.   Delay start of Pharmacological VTE agent (>24hrs) due to surgical blood loss or risk of bleeding: not applicable

## 2024-08-02 NOTE — Anesthesia Procedure Notes (Signed)
 Procedure Name: Intubation Date/Time: 08/02/2024 11:00 AM  Performed by: Claudene Florina Boga, CRNAPre-anesthesia Checklist: Patient identified, Emergency Drugs available, Suction available and Patient being monitored Patient Re-evaluated:Patient Re-evaluated prior to induction Oxygen Delivery Method: Circle System Utilized Preoxygenation: Pre-oxygenation with 100% oxygen Induction Type: IV induction Ventilation: Mask ventilation without difficulty Laryngoscope Size: Mac and 3 Grade View: Grade I Tube type: Oral Tube size: 7.0 mm Number of attempts: 1 Airway Equipment and Method: Stylet Placement Confirmation: ETT inserted through vocal cords under direct vision, positive ETCO2 and breath sounds checked- equal and bilateral Secured at: 22 cm Tube secured with: Tape Dental Injury: Teeth and Oropharynx as per pre-operative assessment

## 2024-08-02 NOTE — Progress Notes (Signed)
 PROGRESS NOTE    [name]  FMW:[id number] DOB: 17-Jul-1966 DOA: 07/31/2024 PCP: Vicci Barnie NOVAK, MD   Brief Narrative:  This 58 years old female with PMH significant for essential hypertension, hyperlipidemia, non-insulin-dependent type 2 diabetes, chronic vertigo, chronic back pain presented in the ED with complaints of right lower quadrant abdominal pain radiating towards the right upper quadrant associated with some nausea.  RUQ ultrasound shows mildly distended gallbladder with intra and extrahepatic biliary ductal dilatation.  No evidence of acute cholecystitis or radiopaque stone. LFTs are significantly elevated.  EDP discussed the case with gastroenterologist who recommended MRCP which cannot be completed since patient has metallic object in in her back.  GI consulted patient underwent ERCP, stone was successfully removed.  LFTs improving.  General surgery consulted patient is going to have laparoscopic cholecystectomy today.  Assessment & Plan:   Principal Problem:   Cholelithiasis with choledocholithiasis Active Problems:   Vertigo   Non-insulin dependent type 2 diabetes mellitus (HCC)   Hyperlipidemia   Essential hypertension   Cholangitis  Cholelithiasis with choledocholithiasis: Elevated Liver enzymes: Patient presented with RLQ pain radiating towards the RUQ pain. CMP shows elevated AST/ ALT , alkaline phosphatase.  Lipase 27. CT A&P shows mildly distended gallbladder with intra and extrahepatic biliary duct dilatation. MRCP cannot be completed since patient has metallic object in the back. Ultrasound showed cholelithiasis within a distended gallbladder without evidence of acute cholecystitis. Continue empiric antibiotics ( ciprofloxacin  and metronidazole ). GI consulted,  Patient was taken to the ERCP, stone successfully removed. Follow-up for post ERCP complications. General surgery consulted , patient is scheduled for laparoscopic cholecystectomy.    Non-insulin-dependent DM type II: Hold metformin  as patient is going for surgery. Continue sliding scale.   Essential hypertension: Holding blood pressure medication as patient is normotensive.   Chronic vertigo: Continue meclizine  as needed   DVT prophylaxis: Lovenox Code Status: Full code Family Communication: No family at bed side Disposition Plan:    Status is: Inpatient Remains inpatient appropriate because: Admitted for choledocholithiasis underwent ERCP stone was removed,  now patient is going to have laparoscopic cholecystectomy today.   Consultants:  Gastroenterology General surgery  Procedures: ERCP / Laparoscopic cholecystectomy Antimicrobials:  Anti-infectives (From admission, onward)    Start     Dose/Rate Route Frequency Ordered Stop   08/01/24 1600  metroNIDAZOLE  (FLAGYL ) IVPB 500 mg        500 mg 100 mL/hr over 60 Minutes Intravenous Every 12 hours 08/01/24 0405 08/06/24 1559   08/01/24 1600  ciprofloxacin  (CIPRO ) IVPB 400 mg        400 mg 200 mL/hr over 60 Minutes Intravenous Every 12 hours 08/01/24 0405 08/06/24 1559   08/01/24 0300  ciprofloxacin  (CIPRO ) IVPB 400 mg       Placed in And Linked Group   400 mg 200 mL/hr over 60 Minutes Intravenous  Once 08/01/24 0248 08/01/24 0435   08/01/24 0300  metroNIDAZOLE  (FLAGYL ) IVPB 500 mg       Placed in And Linked Group   500 mg 100 mL/hr over 60 Minutes Intravenous  Once 08/01/24 0248 08/01/24 0440      Subjective: Patient was seen and examined at bedside.  Overnight events noted. Patient reports feeling much improved, she is scheduled for laparoscopic cholecystectomy today. She denies any abdominal pain, nausea, vomiting.  Objective: Vitals:   08/02/24 1200 08/02/24 1215 08/02/24 1230 08/02/24 1242  BP: 133/80 126/80 129/74 133/81  Pulse: 85 86 85 89  Resp: (!) 22 17 15  20  Temp:   98.5 F (36.9 C) 98 F (36.7 C)  TempSrc:    Oral  SpO2: 94% 93% 94% 97%  Weight:      Height:         Intake/Output Summary (Last 24 hours) at 08/02/2024 1316 Last data filed at 08/02/2024 1134 Gross per 24 hour  Intake 3099.33 ml  Output 3 ml  Net 3096.33 ml   Filed Weights   08/01/24 0014 08/02/24 1019  Weight: 67 kg 67 kg    Examination:  General exam: Appears calm and comfortable, not in any acute distress. Respiratory system: Clear to auscultation. Respiratory effort normal.  [id number] Cardiovascular system: S1 & S2 heard, RRR. No JVD, murmurs, rubs, gallops or clicks.  Gastrointestinal system: Abdomen is non distended, soft and non tender. . Normal bowel sounds heard. Central nervous system: Alert and oriented x 3. No focal neurological deficits. Extremities: No edema, no cyanosis, no clubbing. Skin: No rashes, lesions or ulcers Psychiatry: Judgement and insight appear normal. Mood & affect appropriate.     Data Reviewed: I have personally reviewed following labs and imaging studies  CBC: Recent Labs  Lab 07/31/24 2220 08/01/24 0613 08/02/24 0524  WBC 9.8 17.8* 17.1*  NEUTROABS 8.4*  --   --   HGB 12.8 12.4 12.3  HCT 39.2 36.7 36.7  MCV 90.5 88.2 88.2  PLT 225 196 199   Basic Metabolic Panel: Recent Labs  Lab 07/31/24 2220 08/01/24 0613 08/02/24 0524  NA 138 135 140  K 3.6 3.4* 4.2  CL 101 100 102  CO2 26 23 28   GLUCOSE 127* 163* 113*  BUN 9 6 10   CREATININE 0.82 0.63 0.74  CALCIUM  9.4 8.7* 9.2  MG  --   --  1.8  PHOS  --   --  3.8   GFR: Estimated Creatinine Clearance: 71.2 mL/min (by C-G formula based on SCr of 0.74 mg/dL). Liver Function Tests: Recent Labs  Lab 07/31/24 2220 08/01/24 0613 08/02/24 0524  AST 937* 510* 173*  ALT 852* 697* 503*  ALKPHOS 259* 232* 205*  BILITOT 2.1* 3.2* 4.4*  PROT 7.4 6.9 6.9  ALBUMIN 4.0 3.6 3.4*   Recent Labs  Lab 07/31/24 2220 08/01/24 1706  LIPASE 27 591*   No results for input(s): AMMONIA in the last 168 hours. Coagulation Profile: Recent Labs  Lab 08/01/24 1706  INR 1.2   Cardiac  Enzymes: No results for input(s): CKTOTAL, CKMB, CKMBINDEX, TROPONINI in the last 168 hours. BNP (last 3 results) No results for input(s): PROBNP in the last 8760 hours. HbA1C: No results for input(s): HGBA1C in the last 72 hours. CBG: Recent Labs  Lab 08/01/24 1349 08/02/24 1017 08/02/24 1152  GLUCAP 135* 104* 116*   Lipid Profile: No results for input(s): CHOL, HDL, LDLCALC, TRIG, CHOLHDL, LDLDIRECT in the last 72 hours. Thyroid  Function Tests: No results for input(s): TSH, T4TOTAL, FREET4, T3FREE, THYROIDAB in the last 72 hours. Anemia Panel: No results for input(s): VITAMINB12, FOLATE, FERRITIN, TIBC, IRON, RETICCTPCT in the last 72 hours. Sepsis Labs: No results for input(s): PROCALCITON, LATICACIDVEN in the last 168 hours.  Recent Results (from the past 240 hours)  MRSA Next Gen by PCR, Nasal     Status: None   Collection Time: 08/02/24  5:33 AM   Specimen: Nasal Mucosa; Nasal Swab  Result Value Ref Range Status   MRSA by PCR Next Gen NOT DETECTED NOT DETECTED Final    Comment: (NOTE) The GeneXpert MRSA Assay (FDA approved for NASAL specimens  only), is one component of a comprehensive MRSA colonization surveillance program. It is not intended to diagnose MRSA infection nor to guide or monitor treatment for MRSA infections. Test performance is not FDA approved in patients less than 56 years old. Performed at [facility], [address], [place], [place] [address]     Radiology Studies: DG ERCP Result Date: 08/02/2024 CLINICAL DATA:  [id number] Surgery, elective [id number] EXAM: ERCP COMPARISON:  CT AP, 08/01/2024.  US  Abdomen, 08/01/2024 FLUOROSCOPY: Exposure Index (as provided by the fluoroscopic device): 5.3 mGy Kerma FINDINGS: Limited oblique planar images of the RIGHT upper quadrant obtained C-arm. Images demonstrating flexible endoscopy, biliary duct cannulation, sphincterotomy, retrograde cholangiogram and balloon  sweep. No biliary ductal dilation. No evidence of biliary filling defect is demonstrated. IMPRESSION: Fluoroscopic imaging for ERCP For complete description of intra procedural findings, please see performing service dictation. Electronically Signed   By: Thom Hall M.D.   On: 08/02/2024 07:42   US  Abdomen Limited Result Date: 08/01/2024 CLINICAL DATA:  Right-sided pain with elevated liver enzymes. EXAM: ULTRASOUND ABDOMEN LIMITED RIGHT UPPER QUADRANT COMPARISON:  February 13, 2017 FINDINGS: Gallbladder: Multiple large, shadowing, mobile echogenic gallstones are seen within the lumen of a distended gallbladder (measures approximately 11.1 cm in length). The largest measures approximately 1.8 cm. There is no evidence of gallbladder wall thickening (2.2 mm). No sonographic Murphy sign noted by sonographer. Common bile duct: Diameter: 6.5 mm Liver: No focal lesion identified. Mildly increased echogenicity of the liver parenchyma is seen. Portal vein is patent on color Doppler imaging with normal direction of blood flow towards the liver. Other: None. IMPRESSION: 1. Cholelithiasis within a distended gallbladder, without evidence of acute cholecystitis. 2. Hepatic steatosis. Electronically Signed   By: Suzen Dials M.D.   On: 08/01/2024 02:24   CT ABDOMEN PELVIS W CONTRAST Result Date: 08/01/2024 EXAM: CT ABDOMEN AND PELVIS WITH CONTRAST 08/01/2024 12:17:24 AM TECHNIQUE: CT of the abdomen and pelvis was performed with the administration of 75 mL of iohexol  (OMNIPAQUE ) 350 MG/ML injection. Multiplanar reformatted images are provided for review. Automated exposure control, iterative reconstruction, and/or weight-based adjustment of the mA/kV was utilized to reduce the radiation dose to as low as reasonably achievable. COMPARISON: CT abdomen and pelvis 05/03/2023. CLINICAL HISTORY: RLQ abdominal pain. FINDINGS: LOWER CHEST: Airspace opacities in the bilateral lower lobes may be due to atelectasis or pneumonia.  LIVER: New dilation of the intrahepatic bile ducts. GALLBLADDER AND BILE DUCTS: Mild distention of the gallbladder. No radiopaque stone. No gallbladder wall thickening or pericholecystic fluid. The common bile duct measures 8 mm. SPLEEN: No acute abnormality. PANCREAS: No evidence of pancreatitis. No pancreatic ductal dilation. ADRENAL GLANDS: No acute abnormality. KIDNEYS, URETERS AND BLADDER: Nonobstructing left nephrolithiasis. No hydronephrosis. No perinephric or periureteral stranding. Urinary bladder is unremarkable. GI AND BOWEL: Stomach demonstrates no acute abnormality. Sigmoid diverticulosis without evidence of acute diverticulitis. Normal appendix. There is no bowel obstruction. PERITONEUM AND RETROPERITONEUM: No ascites. No free air. VASCULATURE: Aorta is normal in caliber. LYMPH NODES: No lymphadenopathy. REPRODUCTIVE ORGANS: No acute abnormality. BONES AND SOFT TISSUES: No acute osseous abnormality. Neuro stimulator is present in the right posterior back. No focal soft tissue abnormality. IMPRESSION: 1. Mildly distended gallbladder with new intra- and extrahepatic biliary dilation compared to CT 02/28 to 12/07/2022. No evidence of acute cholecystitis or radiopaque stone. Recommend correlation with LFTs and consider ultrasound or MRCP for further evaluation. 2. Bilateral lower lobe airspace opacities, which may represent atelectasis or pneumonia. Electronically signed by: Norman Gatlin  MD 08/01/2024 12:58 AM EDT RP Workstation: [id number]   Scheduled Meds:  diclofenac   100 mg Rectal Once   sodium chloride  flush  3 mL Intravenous Q12H   sodium chloride  flush  3 mL Intravenous Q12H   Continuous Infusions:  ciprofloxacin  400 mg (08/02/24 0635)   metronidazole  500 mg (08/02/24 0527)     LOS: 1 day    Time spent: 50 mins    Darcel Dawley, MD [facility]   If 7PM-7AM, please contact night-coverage

## 2024-08-02 NOTE — Progress Notes (Signed)
 [facility] Progress Note  Chief Complaint: Choledocholithiasis and cholangitis  History:  [name] says her upper abdominal pain is still present but considerably improved since I saw her yesterday morning. No fevers yesterday or overnight. She underwent ERCP with Dr. Charlanne where sphincterotomy was performed after easy biliary cannulation and small stone(s) removed from the bile clear, nonpurulent biliary drainage. (I also communicated with Dr. Charlanne after the procedure yesterday) Gallstones were seen on the cholangiogram along with a patent cystic duct.  ROS: Cardiovascular: No chest pain Respiratory: Light dry cough Urinary: No urinary symptoms Chronic back pain as before Objective:   Current Facility-Administered Medications:    ciprofloxacin  (CIPRO ) IVPB 400 mg, 400 mg, Intravenous, Q12H, Sundil, Subrina, MD, Last Rate: 200 mL/hr at 08/02/24 0635, 400 mg at 08/02/24 9364   diclofenac  suppository 100 mg, 100 mg, Rectal, Once, Diane Diane LITTIE DOUGLAS, MD   fentaNYL  (SUBLIMAZE ) injection 12.5-25 mcg, 12.5-25 mcg, Intravenous, Q2H PRN, Sundil, Subrina, MD, 25 mcg at 08/01/24 1634   ibuprofen  (ADVIL ) tablet 400 mg, 400 mg, Oral, Q6H PRN, Sundil, Subrina, MD   meclizine  (ANTIVERT ) tablet 25 mg, 25 mg, Oral, TID PRN, Sundil, Subrina, MD   metroNIDAZOLE  (FLAGYL ) IVPB 500 mg, 500 mg, Intravenous, Q12H, Sundil, Subrina, MD, Last Rate: 100 mL/hr at 08/02/24 0527, 500 mg at 08/02/24 0527   ondansetron  (ZOFRAN ) tablet 4 mg, 4 mg, Oral, Q6H PRN **OR** ondansetron  (ZOFRAN ) injection 4 mg, 4 mg, Intravenous, Q6H PRN, Sundil, Subrina, MD   sodium chloride  flush (NS) 0.9 % injection 3 mL, 3 mL, Intravenous, Q12H, Sundil, Subrina, MD, 3 mL at 08/01/24 2200   sodium chloride  flush (NS) 0.9 % injection 3 mL, 3 mL, Intravenous, Q12H, Sundil, Subrina, MD, 3 mL at 08/01/24 2200   sodium chloride  flush (NS) 0.9 % injection 3 mL, 3 mL, Intravenous, PRN, Sundil, Subrina, MD   ciprofloxacin  400 mg (08/02/24 9364)    metronidazole  500 mg (08/02/24 0527)     Vital signs in last 24 hrs: Vitals:   08/01/24 1944 08/02/24 0755  BP: 111/70 110/79  Pulse: 81 80  Resp: 15   Temp: 97.9 F (36.6 C) 98 F (36.7 C)  SpO2: 98% 97%    Intake/Output Summary (Last 24 hours) at 08/02/2024 0920 Last data filed at 08/01/2024 1809 Gross per 24 hour  Intake 2499.33 ml  Output --  Net 2499.33 ml     Physical Exam Affect much brighter and she is much more comfortable than when I saw her yesterday morning HEENT: sclera faintly icteric, oral mucosa without lesions Neck: supple, no thyromegaly, JVD or lymphadenopathy Cardiac: RRR without murmurs, S1S2 heard, no peripheral edema Pulm: clear to auscultation bilaterally, normal RR and effort noted Abdomen: soft, + epigastric/RUQ tenderness, with active bowel sounds. No guarding or palpable hepatosplenomegaly Skin; warm and dry, no jaundice  Recent Labs:     Latest Ref Rng & Units 08/02/2024    5:24 AM 08/01/2024    6:13 AM 07/31/2024   10:20 PM  CBC  WBC 4.0 - 10.5 K/uL 17.1  17.8  9.8   Hemoglobin 12.0 - 15.0 g/dL 87.6  87.5  87.1   Hematocrit 36.0 - 46.0 % 36.7  36.7  39.2   Platelets 150 - 400 K/uL 199  196  225     Recent Labs  Lab 08/01/24 1706  INR 1.2      Latest Ref Rng & Units 08/02/2024    5:24 AM 08/01/2024    6:13 AM 07/31/2024   10:20  PM  CMP  Glucose 70 - 99 mg/dL 886  836  872   BUN 6 - 20 mg/dL 10  6  9    Creatinine 0.44 - 1.00 mg/dL 9.25  9.36  9.17   Sodium 135 - 145 mmol/L 140  135  138   Potassium 3.5 - 5.1 mmol/L 4.2  3.4  3.6   Chloride 98 - 111 mmol/L 102  100  101   CO2 22 - 32 mmol/L 28  23  26    Calcium  8.9 - 10.3 mg/dL 9.2  8.7  9.4   Total Protein 6.5 - 8.1 g/dL 6.9  6.9  7.4   Total Bilirubin 0.0 - 1.2 mg/dL 4.4  3.2  2.1   Alkaline Phos 38 - 126 U/L 205  232  259   AST 15 - 41 U/L 173  510  937   ALT 0 - 44 U/L 503  697  852    Amylase 591 last evening, not checked this morning  There do not appear to have  been any blood cultures drawn (if so, there is nothing listing as pending results)  ERCP findings:       The scout film was normal. Spinal stimulator was visualized. The       esophagus was successfully intubated under direct vision. The scope was       advanced to a normal major papilla in the descending duodenum without       detailed examination of the pharynx, larynx and associated structures,       and upper GI tract. The upper GI tract was grossly normal.      The bile duct was deeply cannulated using a wire-guided approach on       first attempt. Contrast was injected. I personally interpreted the bile       duct images. There was brisk flow of contrast through the ducts. Image       quality was adequate. Contrast extended to the entire biliary tree.      CBD measured 10 mm with a small filling defect in the mid CBD measuring       approximately 6 mm. Cystic duct was patent. Few filling defects in the       gallbladder consistent with cholelithiasis. A biliary sphincterotomy was       made with a monofilament traction (standard) sphincterotome using ERBE       electrocautery on endocut mode at 12 o'clock position. There was no       post-sphincterotomy bleeding. The biliary tree was swept with a 12 mm       balloon starting at the bifurcation. A small stone and sludge was swept       from the duct. All stones were removed. Postocclusion cholangiogram did       not reveal any residual filling defects. Bile was green in color. No pus.      PD was intentionally not cannulated or injected.    Assessment & Plan  Assessment:  Choledocholithiasis with clinical picture of cholangitis with admission fever right upper quadrant pain and mixed picture elevated LFTs. Gallstones.  Despite persistent leukocytosis, there does not appear to be cholecystitis based on the cystic patent duct on ERCP cholangiogram  Bilirubin rose a little today with remainder of LFTs getting better, typical after  ERCP.  Duct was clear during yesterday's procedure  Significance of the lipase of 591 last evening uncertain (not clear why it was checked, no nursing or provider  note in chart after the ERCP report).  Easy biliary cannulation, PD not cannulated nor wired. She feels much better today, still some epigastric tenderness.   Plan: Check lipase tomorrow with CMP Advance to full liquid diet Surgical consult requested.  I spoke with the surgical attending, and they will see her in consultation when they can to discuss a laparoscopic cholecystectomy that I think would be best done during this admission.  Continue current antibiotics for leukocytosis with resolution of fever overall clinical improvement.  Check CBC with differential tomorrow morning  This consultation required a moderate degree of medical decision making due to the nature and complexity of the acute condition(s) being evaluated as well as the patient's medical comorbidities.  [name] [phone]  [name] Office: [phone]

## 2024-08-02 NOTE — Consult Note (Addendum)
 Consult Note  Glennice J Stach June 11, 1966  [id number].    Requesting MD:  Dr. Darcel Dawley, MD  Chief Complaint/Reason for Consult:  Alvira Pangborn is a 58 year old female with a PMH of HLD, HTN, hemorrhoids, GERD, constipation, anemia, and history of anal fistual that presented to the hospital 07/31/2024 due to right lower quadrant pain that began on 07/30/2024. Reported that it radiated to the right upper quadrant and became more severe. Patient had nausea and anorexia. Denies vomiting and diarrhea.      HPI:  Pt with abd pain 1d ago and was brought to ED.  Pt was found to have GS and dilated CBD And elev transaminases and TB. Pt states she has had abd pain for mult years.  Pt underwent ERCP and  CBD clearance Sat. Per GI  Denies any other abd surgery  ROS: Review of Systems  Constitutional:  Negative for chills, fever and malaise/fatigue.  HENT:  Negative for ear discharge, hearing loss and sore throat.   Eyes:  Negative for blurred vision and discharge.  Respiratory:  Negative for cough and shortness of breath.   Cardiovascular:  Negative for chest pain, orthopnea and leg swelling.  Gastrointestinal:  Positive for abdominal pain. Negative for constipation, diarrhea, heartburn, nausea and vomiting.  Musculoskeletal:  Negative for myalgias and neck pain.  Skin:  Negative for itching and rash.  Neurological:  Negative for dizziness, focal weakness, seizures and loss of consciousness.  Endo/Heme/Allergies:  Negative for environmental allergies. Does not bruise/bleed easily.  Psychiatric/Behavioral:  Negative for depression and suicidal ideas.   All other systems reviewed and are negative.   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 Claritin    Vertigo     Past Surgical History:  Procedure Laterality Date   ANAL FISTULECTOMY  08/22/2012   Procedure: FISTULECTOMY ANAL;  Surgeon: Redell Faith, DO;  Location: [facility];  Service: General;  Laterality: N/A;  rectal examination under anesthesia with seton placement    ANAL FISTULECTOMY  09/17/2012   Procedure: FISTULECTOMY ANAL;  Surgeon: Redell Faith, DO;  Location: [facility];  Service: General;  Laterality: N/A;  possible fistulotomy   BACK SURGERY  10/19/2022   D & C HYSTEROSCOPY/ NOVASURE ENDOMETRIAL ABLATION/ I & D THROMBOSED HEMORROID  01-10-2007  DR PEGGYE ROSS   EXAMINATION UNDER ANESTHESIA  09/17/2012   Procedure: EXAM UNDER ANESTHESIA;  Surgeon: Redell Faith, DO;  Location: [facility];  Service: General;  Laterality: N/A;  Rectal exam under anesthesia   EXAMINATION UNDER ANESTHESIA N/A 05/14/2013   Procedure: EXAM UNDER ANESTHESIA;  Surgeon: Bernarda Ned, MD;  Location: [facility];  Service: General;  Laterality: N/A;   FISTULA PLUG  09/17/2012   Procedure: FISTULA PLUG;  Surgeon: Redell Faith, DO;  Location: [facility];  Service: General;  Laterality: N/A;  Possible fistula plug   HEMORRHOID SURGERY  04/28/2009   PPH INTERNAL HEMORRHOIDS   IRRIGATION AND DEBRIDEMENT ABSCESS  07/08/2012   Procedure: MINOR INCISION AND DRAINAGE OF ABSCESS;  Surgeon: Hargis Paradise, MD;  Location: [facility];  Service: Gynecology;  Laterality: N/A;  Vulvar Abscess   PLACEMENT OF SETON N/A 05/14/2013   Procedure:  PLACEMENT OF SETON;  Surgeon: Bernarda Ned, MD;  Location: [facility];  Service:  General;  Laterality: N/A;   RECTAL EXAM UNDER ANESTHESIA N/A 08/20/2013   Procedure: mucosal advancement flap ;  Surgeon: Bernarda Ned, MD;  Location: [facility];  Service: General;  Laterality: N/A;  parks anal retractor long rectal instrucments prone jack knife anal fistula    RECTAL ULTRASOUND N/A 07/20/2013   Procedure: RECTAL ULTRASOUND;  Surgeon: Bernarda Ned, MD;  Location: [facility];  Service: Endoscopy;  Laterality: N/A;    Social History:  reports that she has never smoked. She has never used smokeless tobacco. She reports that she does not drink alcohol and does not use drugs.  Allergies:  Allergies  Allergen Reactions   Acetaminophen  Itching and Rash    Other Reaction(s): Not available   Ampicillin Anaphylaxis   Atorvastatin  Calcium  Other (See Comments)    Pt reported lip swelling   Penicillins Anaphylaxis   Sulfonamide Derivatives Anaphylaxis   Tramadol Itching and Rash   Hydrocodone -Acetaminophen  Hives and Other (See Comments)    Have to take a Benadryl  to take med    Medications Prior to Admission  Medication Sig Dispense Refill   albuterol  (VENTOLIN  HFA) 108 (90 Base) MCG/ACT inhaler Inhale 2 puffs into the lungs every 6 (six) hours as needed for wheezing or shortness of breath. 6.7 g 0   amLODipine  (NORVASC ) 10 MG tablet Take 1 tablet (10 mg total) by mouth daily. 90 tablet 1   benzonatate  (TESSALON ) 100 MG capsule Take 1 capsule (100 mg total) by mouth 3 (three) times daily. 30 capsule 0   cetirizine  (ZYRTEC ) 10 MG tablet Take 1 tablet (10 mg total) by mouth daily. 90 tablet 1   cromolyn (OPTICROM) 4 % ophthalmic solution Place 2 drops into both eyes 2 (two) times daily as needed (allergies).     EPINEPHrine  (EPIPEN  2-PAK) 0.3 mg/0.3 mL IJ SOAJ injection Inject 0.3 mLs (0.3 mg total) into the muscle as needed for anaphylaxis. 1 each 0   fluticasone  (FLONASE ) 50 MCG/ACT nasal spray Place 1 spray into both nostrils daily. (Patient taking differently: Place 1 spray  into both nostrils daily as needed for allergies or rhinitis.) 16 g 3   gabapentin  (NEURONTIN ) 300 MG capsule Take 1 capsule (300 mg total) by mouth at bedtime. (Patient taking differently: Take 300 mg by mouth 2 (two) times daily.) 30 capsule 3   hydrOXYzine  (VISTARIL ) 25 MG capsule TAKE 1 CAPSULE(25 MG) BY MOUTH DAILY AS NEEDED FOR ANXIETY 90 capsule 0   LINZESS  290 MCG CAPS capsule TAKE 1 CAPSULE(290 MCG) BY MOUTH DAILY BEFORE BREAKFAST 90 capsule 1   meclizine  (ANTIVERT ) 25 MG tablet TAKE 1 TABLET(25 MG) BY MOUTH TWICE DAILY AS NEEDED FOR DIZZINESS 20 tablet 1   metFORMIN  (GLUCOPHAGE ) 500 MG tablet Take 1 tablet (500 mg total) by mouth daily with breakfast. 90 tablet 1   omeprazole  (PRILOSEC) 20 MG capsule Take 1 capsule (20 mg total) by mouth 2 (two) times daily before a meal. 180 capsule 3  oxyCODONE  (OXY IR/ROXICODONE ) 5 MG immediate release tablet Take 5 mg by mouth every 6 (six) hours as needed for moderate pain (pain score 4-6), severe pain (pain score 7-10) or breakthrough pain.     rosuvastatin  (CRESTOR ) 20 MG tablet Take 1 tablet (20 mg total) by mouth daily. 90 tablet 1   fluticasone  furoate-vilanterol (BREO ELLIPTA ) 100-25 MCG/ACT AEPB Inhale 1 puff into the lungs daily. (Patient not taking: Reported on 08/01/2024) 1 each 5   montelukast  (SINGULAIR ) 10 MG tablet Take 1 tablet (10 mg total) by mouth at bedtime. (Patient not taking: Reported on 08/01/2024) 30 tablet 11    Blood pressure 110/79, pulse 80, temperature 98 F (36.7 C), temperature source Oral, resp. rate 15, height 5' 4 (1.626 m), weight 67 kg, SpO2 97%. PE:  Constitutional: No acute distress, conversant, appears states age. Eyes: Anicteric sclerae, moist conjunctiva, no lid lag Lungs: Clear to auscultation bilaterally, normal respiratory effort CV: regular rate and rhythm, no murmurs, no peripheral edema, pedal pulses 2+ GI: Soft, no masses or hepatosplenomegaly, min-tender to palpation epigastrum Skin: No rashes,  palpation reveals normal turgor Psychiatric: appropriate judgment and insight, oriented to person, place, and time    Results for orders placed or performed during the hospital encounter of 07/31/24 (from the past 48 hours)  Comprehensive metabolic panel     Status: Abnormal   Collection Time: 07/31/24 10:20 PM  Result Value Ref Range   Sodium 138 135 - 145 mmol/L   Potassium 3.6 3.5 - 5.1 mmol/L   Chloride 101 98 - 111 mmol/L   CO2 26 22 - 32 mmol/L   Glucose, Bld 127 (H) 70 - 99 mg/dL    Comment: Glucose reference range applies only to samples taken after fasting for at least 8 hours.   BUN 9 6 - 20 mg/dL   Creatinine, Ser 9.17 0.44 - 1.00 mg/dL   Calcium  9.4 8.9 - 10.3 mg/dL   Total Protein 7.4 6.5 - 8.1 g/dL   Albumin 4.0 3.5 - 5.0 g/dL   AST 062 (H) 15 - 41 U/L   ALT 852 (H) 0 - 44 U/L   Alkaline Phosphatase 259 (H) 38 - 126 U/L   Total Bilirubin 2.1 (H) 0.0 - 1.2 mg/dL   GFR, Estimated >39 >39 mL/min    Comment: (NOTE) Calculated using the CKD-EPI Creatinine Equation (2021)    Anion gap 11 5 - 15    Comment: Performed at [facility], [address] [place], [place] [address]  Lipase, blood     Status: None   Collection Time: 07/31/24 10:20 PM  Result Value Ref Range   Lipase 27 11 - 51 U/L    Comment: Performed at [facility], [address], [place], [place] [address]  CBC with Diff     Status: Abnormal   Collection Time: 07/31/24 10:20 PM  Result Value Ref Range   WBC 9.8 4.0 - 10.5 K/uL   RBC 4.33 3.87 - 5.11 MIL/uL   Hemoglobin 12.8 12.0 - 15.0 g/dL   HCT 60.7 63.9 - 53.9 %   MCV 90.5 80.0 - 100.0 fL   MCH 29.6 26.0 - 34.0 pg   MCHC 32.7 30.0 - 36.0 g/dL   RDW 87.3 88.4 - 84.4 %   Platelets 225 150 - 400 K/uL   nRBC 0.0 0.0 - 0.2 %   Neutrophils Relative % 87 %   Neutro Abs 8.4 (H) 1.7 - 7.7 K/uL   Lymphocytes Relative 7 %  Lymphs Abs 0.7 0.7 - 4.0 K/uL   Monocytes Relative 6 %   Monocytes Absolute 0.6 0.1 - 1.0 K/uL    Eosinophils Relative 0 %   Eosinophils Absolute 0.0 0.0 - 0.5 K/uL   Basophils Relative 0 %   Basophils Absolute 0.0 0.0 - 0.1 K/uL   Immature Granulocytes 0 %   Abs Immature Granulocytes 0.03 0.00 - 0.07 K/uL    Comment: Performed at [facility], [address], [place], [place] [address]  Urinalysis, Routine w reflex microscopic -Urine, Clean Catch     Status: Abnormal   Collection Time: 08/01/24  1:31 AM  Result Value Ref Range   Color, Urine YELLOW YELLOW   APPearance CLEAR CLEAR   Specific Gravity, Urine 1.042 (H) 1.005 - 1.030   pH 7.0 5.0 - 8.0   Glucose, UA NEGATIVE NEGATIVE mg/dL   Hgb urine dipstick NEGATIVE NEGATIVE   Bilirubin Urine NEGATIVE NEGATIVE   Ketones, ur 5 (A) NEGATIVE mg/dL   Protein, ur NEGATIVE NEGATIVE mg/dL   Nitrite NEGATIVE NEGATIVE   Leukocytes,Ua NEGATIVE NEGATIVE   RBC / HPF 0-5 0 - 5 RBC/hpf   WBC, UA 0-5 0 - 5 WBC/hpf   Bacteria, UA NONE SEEN NONE SEEN   Squamous Epithelial / HPF 0-5 0 - 5 /HPF   Mucus PRESENT    Ca Oxalate Crys, UA PRESENT     Comment: Performed at [facility], [address], [place], [place] [address]  Comprehensive metabolic panel     Status: Abnormal   Collection Time: 08/01/24  6:13 AM  Result Value Ref Range   Sodium 135 135 - 145 mmol/L   Potassium 3.4 (L) 3.5 - 5.1 mmol/L   Chloride 100 98 - 111 mmol/L   CO2 23 22 - 32 mmol/L   Glucose, Bld 163 (H) 70 - 99 mg/dL    Comment: Glucose reference range applies only to samples taken after fasting for at least 8 hours.   BUN 6 6 - 20 mg/dL   Creatinine, Ser 9.36 0.44 - 1.00 mg/dL   Calcium  8.7 (L) 8.9 - 10.3 mg/dL   Total Protein 6.9 6.5 - 8.1 g/dL   Albumin 3.6 3.5 - 5.0 g/dL   AST 489 (H) 15 - 41 U/L   ALT 697 (H) 0 - 44 U/L   Alkaline Phosphatase 232 (H) 38 - 126 U/L   Total Bilirubin 3.2 (H) 0.0 - 1.2 mg/dL   GFR, Estimated >39 >39 mL/min    Comment: (NOTE) Calculated using the CKD-EPI Creatinine Equation (2021)    Anion gap 12 5 - 15    Comment:  Performed at [facility], [address] [place], [place] [address]  CBC     Status: Abnormal   Collection Time: 08/01/24  6:13 AM  Result Value Ref Range   WBC 17.8 (H) 4.0 - 10.5 K/uL   RBC 4.16 3.87 - 5.11 MIL/uL   Hemoglobin 12.4 12.0 - 15.0 g/dL   HCT 63.2 63.9 - 53.9 %   MCV 88.2 80.0 - 100.0 fL   MCH 29.8 26.0 - 34.0 pg   MCHC 33.8 30.0 - 36.0 g/dL   RDW 87.1 88.4 - 84.4 %   Platelets 196 150 - 400 K/uL   nRBC 0.0 0.0 - 0.2 %    Comment: Performed at [facility], [address] [place] [address] [place], [place] [address]  Hepatitis panel, acute     Status: None   Collection Time:  08/01/24  6:13 AM  Result Value Ref Range   Hepatitis B Surface Ag NON REACTIVE NON REACTIVE   HCV Ab NON REACTIVE NON REACTIVE    Comment: (NOTE) Nonreactive HCV antibody screen is consistent with no HCV infections,  unless recent infection is suspected or other evidence exists to indicate HCV infection.     Hep A IgM NON REACTIVE NON REACTIVE   Hep B C IgM NON REACTIVE NON REACTIVE    Comment: Performed at [facility], [address], [place], [place] [address]  HIV Antibody (routine testing w rflx)     Status: None   Collection Time: 08/01/24  6:13 AM  Result Value Ref Range   HIV Screen 4th Generation wRfx Non Reactive Non Reactive    Comment: Performed at [facility], [address] [place], [place] [address]  Glucose, capillary     Status: Abnormal   Collection Time: 08/01/24  1:49 PM  Result Value Ref Range   Glucose-Capillary 135 (H) 70 - 99 mg/dL    Comment: Glucose reference range applies only to samples taken after fasting for at least 8 hours.  Protime-INR     Status: Abnormal   Collection Time: 08/01/24  5:06 PM  Result Value Ref Range   Prothrombin Time 15.8 (H) 11.4 - 15.2 seconds   INR 1.2 0.8 - 1.2    Comment: (NOTE) INR goal varies based on device and disease states. Performed at [facility], [address], [place], [place] [address]   Lipase,  blood     Status: Abnormal   Collection Time: 08/01/24  5:06 PM  Result Value Ref Range   Lipase 591 (H) 11 - 51 U/L    Comment: Performed at [facility], [address], [place], [place] [address]  CBC     Status: Abnormal   Collection Time: 08/02/24  5:24 AM  Result Value Ref Range   WBC 17.1 (H) 4.0 - 10.5 K/uL   RBC 4.16 3.87 - 5.11 MIL/uL   Hemoglobin 12.3 12.0 - 15.0 g/dL   HCT 63.2 63.9 - 53.9 %   MCV 88.2 80.0 - 100.0 fL   MCH 29.6 26.0 - 34.0 pg   MCHC 33.5 30.0 - 36.0 g/dL   RDW 86.7 88.4 - 84.4 %   Platelets 199 150 - 400 K/uL   nRBC 0.0 0.0 - 0.2 %    Comment: Performed at [facility], [address] [place], [place] [address]  Comprehensive metabolic panel with GFR     Status: Abnormal   Collection Time: 08/02/24  5:24 AM  Result Value Ref Range   Sodium 140 135 - 145 mmol/L   Potassium 4.2 3.5 - 5.1 mmol/L   Chloride 102 98 - 111 mmol/L   CO2 28 22 - 32 mmol/L   Glucose, Bld 113 (H) 70 - 99 mg/dL    Comment: Glucose reference range applies only to samples taken after fasting for at least 8 hours.   BUN 10 6 - 20 mg/dL   Creatinine, Ser 9.25 0.44 - 1.00 mg/dL   Calcium  9.2 8.9 - 10.3 mg/dL   Total Protein 6.9 6.5 - 8.1 g/dL   Albumin 3.4 (L) 3.5 - 5.0 g/dL   AST 826 (H) 15 - 41 U/L   ALT 503 (H) 0 - 44 U/L   Alkaline Phosphatase 205 (H) 38 - 126 U/L   Total Bilirubin 4.4 (H) 0.0 - 1.2 mg/dL  GFR, Estimated >60 >60 mL/min    Comment: (NOTE) Calculated using the CKD-EPI Creatinine Equation (2021)    Anion gap 10 5 - 15    Comment: Performed at [facility], [address], [place], [place] [address]  Magnesium      Status: None   Collection Time: 08/02/24  5:24 AM  Result Value Ref Range   Magnesium  1.8 1.7 - 2.4 mg/dL    Comment: Performed at [facility], [address], [place], [place] [address]  Phosphorus     Status: None   Collection Time: 08/02/24  5:24 AM  Result Value Ref Range   Phosphorus 3.8 2.5 - 4.6  mg/dL    Comment: Performed at [facility], [address], [place], [place] [address]  MRSA Next Gen by PCR, Nasal     Status: None   Collection Time: 08/02/24  5:33 AM   Specimen: Nasal Mucosa; Nasal Swab  Result Value Ref Range   MRSA by PCR Next Gen NOT DETECTED NOT DETECTED    Comment: (NOTE) The GeneXpert MRSA Assay (FDA approved for NASAL specimens only), is one component of a comprehensive MRSA colonization surveillance program. It is not intended to diagnose MRSA infection nor to guide or monitor treatment for MRSA infections. Test performance is not FDA approved in patients less than 32 years old. Performed at [facility], [address], [place], [place] [address]    DG ERCP Result Date: 08/02/2024 CLINICAL DATA:  [id number] Surgery, elective [id number] EXAM: ERCP COMPARISON:  CT AP, 08/01/2024.  US  Abdomen, 08/01/2024 FLUOROSCOPY: Exposure Index (as provided by the fluoroscopic device): 5.3 mGy Kerma FINDINGS: Limited oblique planar images of the RIGHT upper quadrant obtained C-arm. Images demonstrating flexible endoscopy, biliary duct cannulation, sphincterotomy, retrograde cholangiogram and balloon sweep. No biliary ductal dilation. No evidence of biliary filling defect is demonstrated. IMPRESSION: Fluoroscopic imaging for ERCP For complete description of intra procedural findings, please see performing service dictation. Electronically Signed   By: Thom Hall M.D.   On: 08/02/2024 07:42   US  Abdomen Limited Result Date: 08/01/2024 CLINICAL DATA:  Right-sided pain with elevated liver enzymes. EXAM: ULTRASOUND ABDOMEN LIMITED RIGHT UPPER QUADRANT COMPARISON:  February 13, 2017 FINDINGS: Gallbladder: Multiple large, shadowing, mobile echogenic gallstones are seen within the lumen of a distended gallbladder (measures approximately 11.1 cm in length). The largest measures approximately 1.8 cm. There is no evidence of gallbladder wall thickening (2.2 mm). No sonographic Murphy sign  noted by sonographer. Common bile duct: Diameter: 6.5 mm Liver: No focal lesion identified. Mildly increased echogenicity of the liver parenchyma is seen. Portal vein is patent on color Doppler imaging with normal direction of blood flow towards the liver. Other: None. IMPRESSION: 1. Cholelithiasis within a distended gallbladder, without evidence of acute cholecystitis. 2. Hepatic steatosis. Electronically Signed   By: Suzen Dials M.D.   On: 08/01/2024 02:24   CT ABDOMEN PELVIS W CONTRAST Result Date: 08/01/2024 EXAM: CT ABDOMEN AND PELVIS WITH CONTRAST 08/01/2024 12:17:24 AM TECHNIQUE: CT of the abdomen and pelvis was performed with the administration of 75 mL of iohexol  (OMNIPAQUE ) 350 MG/ML injection. Multiplanar reformatted images are provided for review. Automated exposure control, iterative reconstruction, and/or weight-based adjustment of the mA/kV was utilized to reduce the radiation dose to as low as reasonably achievable. COMPARISON: CT abdomen and pelvis 05/03/2023. CLINICAL HISTORY: RLQ abdominal pain. FINDINGS: LOWER CHEST: Airspace opacities in the bilateral lower lobes may be due to atelectasis or pneumonia. LIVER: New  dilation of the intrahepatic bile ducts. GALLBLADDER AND BILE DUCTS: Mild distention of the gallbladder. No radiopaque stone. No gallbladder wall thickening or pericholecystic fluid. The common bile duct measures 8 mm. SPLEEN: No acute abnormality. PANCREAS: No evidence of pancreatitis. No pancreatic ductal dilation. ADRENAL GLANDS: No acute abnormality. KIDNEYS, URETERS AND BLADDER: Nonobstructing left nephrolithiasis. No hydronephrosis. No perinephric or periureteral stranding. Urinary bladder is unremarkable. GI AND BOWEL: Stomach demonstrates no acute abnormality. Sigmoid diverticulosis without evidence of acute diverticulitis. Normal appendix. There is no bowel obstruction. PERITONEUM AND RETROPERITONEUM: No ascites. No free air. VASCULATURE: Aorta is normal in caliber.  LYMPH NODES: No lymphadenopathy. REPRODUCTIVE ORGANS: No acute abnormality. BONES AND SOFT TISSUES: No acute osseous abnormality. Neuro stimulator is present in the right posterior back. No focal soft tissue abnormality. IMPRESSION: 1. Mildly distended gallbladder with new intra- and extrahepatic biliary dilation compared to CT 02/28 to 12/07/2022. No evidence of acute cholecystitis or radiopaque stone. Recommend correlation with LFTs and consider ultrasound or MRCP for further evaluation. 2. Bilateral lower lobe airspace opacities, which may represent atelectasis or pneumonia. Electronically signed by: Norman Gatlin MD 08/01/2024 12:58 AM EDT RP Workstation: [id number]      Assessment/Plan 63F s/p ERCP for CBD stones cholelithiasis  Plan for lap chole today All risks and benefits were discussed with the patient to generally include: infection, bleeding, possible need for post op ERCP, damage to the bile ducts, and bile leak. Alternatives were offered and described.  All questions were answered and the patient voiced understanding of the procedure and wishes to proceed at this point with a laparoscopic cholecystectomy   last 24 h vitals and pain scores, last 48 h intake and output, last 24 h labs and trends, and last 24 h imaging results.  This care required moderate level of medical decision making.    [facility] 08/02/2024, 9:40 AM Please see Amion for pager number during day hours 7:00am-4:30pm

## 2024-08-03 ENCOUNTER — Encounter (HOSPITAL_COMMUNITY): Payer: Self-pay | Admitting: General Surgery

## 2024-08-03 ENCOUNTER — Other Ambulatory Visit (HOSPITAL_COMMUNITY): Payer: Self-pay

## 2024-08-03 DIAGNOSIS — K8070 Calculus of gallbladder and bile duct without cholecystitis without obstruction: Secondary | ICD-10-CM | POA: Diagnosis not present

## 2024-08-03 LAB — COMPREHENSIVE METABOLIC PANEL WITH GFR
ALT: 369 U/L — ABNORMAL HIGH (ref 0–44)
AST: 123 U/L — ABNORMAL HIGH (ref 15–41)
Albumin: 3.1 g/dL — ABNORMAL LOW (ref 3.5–5.0)
Alkaline Phosphatase: 194 U/L — ABNORMAL HIGH (ref 38–126)
Anion gap: 9 (ref 5–15)
BUN: 13 mg/dL (ref 6–20)
CO2: 24 mmol/L (ref 22–32)
Calcium: 9 mg/dL (ref 8.9–10.3)
Chloride: 105 mmol/L (ref 98–111)
Creatinine, Ser: 0.81 mg/dL (ref 0.44–1.00)
GFR, Estimated: >60 mL/min (ref >60)
Glucose, Bld: 119 mg/dL — ABNORMAL HIGH (ref 70–99)
Potassium: 4.8 mmol/L (ref 3.5–5.1)
Sodium: 138 mmol/L (ref 135–145)
Total Bilirubin: 1.1 mg/dL (ref 0.0–1.2)
Total Protein: 6.9 g/dL (ref 6.5–8.1)

## 2024-08-03 LAB — CBC
HCT: 37.6 % (ref 36.0–46.0)
Hemoglobin: 12.5 g/dL (ref 12.0–15.0)
MCH: 29.3 pg (ref 26.0–34.0)
MCHC: 33.2 g/dL (ref 30.0–36.0)
MCV: 88.3 fL (ref 80.0–100.0)
Platelets: 221 K/uL (ref 150–400)
RBC: 4.26 MIL/uL (ref 3.87–5.11)
RDW: 13.2 % (ref 11.5–15.5)
WBC: 13.7 K/uL — ABNORMAL HIGH (ref 4.0–10.5)
nRBC: 0 % (ref 0.0–0.2)

## 2024-08-03 LAB — LIPASE, BLOOD: Lipase: 19 U/L (ref 11–51)

## 2024-08-03 MED ORDER — CIPROFLOXACIN HCL 500 MG PO TABS
500.0000 mg | ORAL_TABLET | Freq: Two times a day (BID) | ORAL | 0 refills | Status: AC
Start: 2024-08-03 — End: 2024-08-07
  Filled 2024-08-03: qty 8, 4d supply, fill #0

## 2024-08-03 MED ORDER — IBUPROFEN 200 MG PO TABS: 600.0000 mg | ORAL_TABLET | Freq: Four times a day (QID) | ORAL | Status: DC

## 2024-08-03 MED ORDER — METHOCARBAMOL 750 MG PO TABS
750.0000 mg | ORAL_TABLET | Freq: Three times a day (TID) | ORAL | 0 refills | Status: AC
  Filled 2024-08-03: qty 45, 15d supply, fill #0

## 2024-08-03 MED ORDER — OXYCODONE HCL 5 MG PO TABS
10.0000 mg | ORAL_TABLET | ORAL | 0 refills | Status: AC | PRN
  Filled 2024-08-03: qty 10, 2d supply, fill #0

## 2024-08-03 MED ORDER — IBUPROFEN 200 MG PO TABS
400.0000 mg | ORAL_TABLET | Freq: Once | ORAL | Status: AC
  Administered 2024-08-03: 400 mg via ORAL
  Filled 2024-08-03: qty 2

## 2024-08-03 MED ORDER — OXYCODONE HCL 5 MG PO TABS: 5.0000 mg | ORAL_TABLET | ORAL | Status: DC | PRN

## 2024-08-03 MED ORDER — METHOCARBAMOL 750 MG PO TABS
750.0000 mg | ORAL_TABLET | Freq: Three times a day (TID) | ORAL | Status: DC
  Administered 2024-08-03: 750 mg via ORAL
  Filled 2024-08-03: qty 1

## 2024-08-03 MED ORDER — IBUPROFEN 600 MG PO TABS
600.0000 mg | ORAL_TABLET | Freq: Four times a day (QID) | ORAL | 0 refills | Status: AC
  Filled 2024-08-03: qty 30, 8d supply, fill #0

## 2024-08-03 NOTE — Progress Notes (Addendum)
 1 Day Post-Op  Subjective: CC: Patient reports she is sore around her incisions that is worse with movement. Still requires prn IV pain medications. She reports she is on oxycodone  5mg , at baseline for her back. She is about to eat breakfast, has not had any solids since surgery. No n/v. Mobilizing to the bathroom. Voiding.   Objective: Vital signs in last 24 hours: Temp:  [97.8 F (36.6 C)-98.5 F (36.9 C)] 97.8 F (36.6 C) (09/29 0741) Pulse Rate:  [66-90] 72 (09/29 0741) Resp:  [15-22] 16 (09/29 0741) BP: (121-133)/(64-85) 121/83 (09/29 0741) SpO2:  [93 %-98 %] 98 % (09/29 0741) Weight:  [67 kg] 67 kg (09/28 1019) Last BM Date : 07/31/24  Intake/Output from previous day: 09/28 0701 - 09/29 0700 In: 900 [I.V.:600; IV Piggyback:300] Out: 3 [Blood:3] Intake/Output this shift: No intake/output data recorded.  PE: Gen:  Alert, NAD, pleasant Abd: Soft, no distension, appropriately tender around laparoscopic incisions, no rigidity or guarding and otherwise NT, +BS. Incisions with glue intact appears well and are without drainage, bleeding, or signs of infection.   Lab Results:  Recent Labs    08/02/24 0524 08/03/24 0347  WBC 17.1* 13.7*  HGB 12.3 12.5  HCT 36.7 37.6  PLT 199 221   BMET Recent Labs    08/02/24 0524 08/03/24 0347  NA 140 138  K 4.2 4.8  CL 102 105  CO2 28 24  GLUCOSE 113* 119*  BUN 10 13  CREATININE 0.74 0.81  CALCIUM  9.2 9.0   PT/INR Recent Labs    08/01/24 1706  LABPROT 15.8*  INR 1.2   CMP     Component Value Date/Time   NA 138 08/03/2024 0347   NA 141 02/25/2024 1102   K 4.8 08/03/2024 0347   CL 105 08/03/2024 0347   CO2 24 08/03/2024 0347   GLUCOSE 119 (H) 08/03/2024 0347   BUN 13 08/03/2024 0347   BUN 8 02/25/2024 1102   CREATININE 0.81 08/03/2024 0347   CALCIUM  9.0 08/03/2024 0347   PROT 6.9 08/03/2024 0347   PROT 7.1 02/25/2024 1102   ALBUMIN 3.1 (L) 08/03/2024 0347   ALBUMIN 4.5 02/25/2024 1102   AST 123 (H)  08/03/2024 0347   ALT 369 (H) 08/03/2024 0347   ALKPHOS 194 (H) 08/03/2024 0347   BILITOT 1.1 08/03/2024 0347   BILITOT 0.2 02/25/2024 1102   GFRNONAA >60 08/03/2024 0347   GFRAA 90 01/28/2020 1625   Lipase     Component Value Date/Time   LIPASE 19 08/03/2024 0347    Studies/Results: DG ERCP Result Date: 08/02/2024 CLINICAL DATA:  [id number] Surgery, elective [id number] EXAM: ERCP COMPARISON:  CT AP, 08/01/2024.  US  Abdomen, 08/01/2024 FLUOROSCOPY: Exposure Index (as provided by the fluoroscopic device): 5.3 mGy Kerma FINDINGS: Limited oblique planar images of the RIGHT upper quadrant obtained C-arm. Images demonstrating flexible endoscopy, biliary duct cannulation, sphincterotomy, retrograde cholangiogram and balloon sweep. No biliary ductal dilation. No evidence of biliary filling defect is demonstrated. IMPRESSION: Fluoroscopic imaging for ERCP For complete description of intra procedural findings, please see performing service dictation. Electronically Signed   By: Thom Hall M.D.   On: 08/02/2024 07:42    Anti-infectives: Anti-infectives (From admission, onward)    Start     Dose/Rate Route Frequency Ordered Stop   08/01/24 1600  metroNIDAZOLE  (FLAGYL ) IVPB 500 mg        500 mg 100 mL/hr over 60 Minutes Intravenous Every 12 hours 08/01/24 0405 08/06/24 1559   08/01/24 1600  ciprofloxacin  (CIPRO ) IVPB 400 mg        400 mg 200 mL/hr over 60 Minutes Intravenous Every 12 hours 08/01/24 0405 08/06/24 1559   08/01/24 0300  ciprofloxacin  (CIPRO ) IVPB 400 mg       Placed in And Linked Group   400 mg 200 mL/hr over 60 Minutes Intravenous  Once 08/01/24 0248 08/01/24 0435   08/01/24 0300  metroNIDAZOLE  (FLAGYL ) IVPB 500 mg       Placed in And Linked Group   500 mg 100 mL/hr over 60 Minutes Intravenous  Once 08/01/24 0248 08/01/24 0440        Assessment/Plan POD 1 s/p laparoscopic cholecystectomy by Dr. Rubin on 08/02/24 - S/p ERCP 9/27 by GI w/ Choledocholithiasis found -  removal by biliary sphincterotomy and balloon extraction. Also noted Cholelithiasis with patent but long cystic duct. - LFT's downtrending w/ normal T. Bili - Okay for reg diet - Adjust pain medication. She reports she is on Oxycodone  5mg  at baseline prescribed by Dr. Norlene. She denies having a pain contract.  - Mobilize. - If patient tolerates diet, pain well controlled with po medications and is mobilizing well, she is okay for discharge from our standpoint. Will arrange follow up. Discussed discharge instructions, restrictions and return/call back precautions.   FEN - Reg VTE - SCDs, okay for chem ppx from a general surgery standpoint ID - Cipro .Flagyl . No further abx needed from our standpoint. GI recommended 5 days of Cipro  after ERCP   LOS: 2 days    Ozell CHRISTELLA Shaper, [facility] Surgery 08/03/2024, 8:45 AM Please see Amion for pager number during day hours 7:00am-4:30pm

## 2024-08-03 NOTE — Discharge Summary (Signed)
 Physician Discharge Summary  [name] FMW:[id number] DOB: 10/17/1966 DOA: 07/31/2024  PCP: Vicci Barnie NOVAK, MD  Admit date: 07/31/2024  Discharge date: 08/03/2024  Admitted From: Home  Disposition: Home  Recommendations for Outpatient Follow-up:  Follow up with PCP in 1-2 weeks. Please obtain BMP/CBC in one week. Advised to follow-up with general surgery in 2 weeks. Advised to take ciprofloxacin  500 mg for 4 more days after ERCP.  Home Health: None Equipment/Devices: None  Discharge Condition: Stable CODE STATUS:Full code Diet recommendation:  Carb modified diet.  Brief Summary/ Hospital Course: This 58 years old female with PMH significant for essential hypertension, hyperlipidemia, non-insulin-dependent type 2 diabetes, chronic vertigo, chronic back pain presented in the ED with complaints of right lower quadrant abdominal pain radiating towards the right upper quadrant associated with some nausea.  RUQ ultrasound shows mildly distended gallbladder with intra and extrahepatic biliary ductal dilatation.  No evidence of acute cholecystitis or radiopaque stone. LFTs werre significantly elevated.  EDP discussed the case with gastroenterologist who recommended MRCP which cannot be completed since patient has metallic object in in her back.  GI consulted,  Patient underwent ERCP, stone was successfully removed.  LFTs improving.  General surgery consulted,  Patient underwent successful laparoscopic cholecystectomy. POD # 1.  Pain reasonably controlled,  LFTs improved.  General Surgery signed off. Patient tolerated regular diet and wants to be discharged home.  Discharge Diagnoses:  Principal Problem:   Cholelithiasis with choledocholithiasis Active Problems:   Vertigo   Non-insulin dependent type 2 diabetes mellitus (HCC)   Hyperlipidemia   Essential hypertension   Cholangitis  Cholelithiasis with choledocholithiasis: Elevated Liver enzymes: Patient presented with RLQ pain  radiating towards the RUQ pain. CMP shows elevated AST/ ALT , alkaline phosphatase.  Lipase 27. CT A&P shows mildly distended gallbladder with intra and extrahepatic biliary duct dilatation. MRCP cannot be completed since patient has metallic object in the back. Ultrasound showed cholelithiasis within a distended gallbladder without evidence of acute cholecystitis. Continue empiric antibiotics ( ciprofloxacin  and metronidazole ). GI consulted,  Patient was taken to the ERCP, stone successfully removed. Follow-up for post ERCP complications. General surgery consulted , status post laparoscopy cholecystectomy.  POD #1 Tolerated regular diet , Pain improved,  LFTs improved.   Non-insulin-dependent DM type II: Held metformin  as patient  going for surgery. Continue sliding scale.   Essential hypertension: Holding blood pressure medication as patient is normotensive.   Chronic vertigo: Continue meclizine  as needed  Discharge Instructions  Discharge Instructions     Call MD for:  difficulty breathing, headache or visual disturbances   Complete by: As directed    Call MD for:  persistant nausea and vomiting   Complete by: As directed    Diet - low sodium heart healthy   Complete by: As directed    Diet general   Complete by: As directed    Discharge instructions   Complete by: As directed    Advised to follow-up with primary care physician in 1 week. Advised to follow-up with general surgery in 2 weeks. Advised to take ciprofloxacin  500 mg for 4 more days after ERCP.   Increase activity slowly   Complete by: As directed       Allergies as of 08/03/2024       Reactions   Acetaminophen  Itching, Rash   Other Reaction(s): Not available   Ampicillin Anaphylaxis   Atorvastatin  Calcium  Other (See Comments)   Pt reported lip swelling   Penicillins Anaphylaxis   Sulfonamide Derivatives Anaphylaxis  Tramadol Itching, Rash   Hydrocodone -acetaminophen  Hives, Other (See Comments)    Have to take a Benadryl  to take med        Medication List     STOP taking these medications    fluticasone  furoate-vilanterol 100-25 MCG/ACT Aepb Commonly known as: Breo Ellipta    montelukast  10 MG tablet Commonly known as: SINGULAIR        TAKE these medications    albuterol  108 (90 Base) MCG/ACT inhaler Commonly known as: VENTOLIN  HFA Inhale 2 puffs into the lungs every 6 (six) hours as needed for wheezing or shortness of breath.   amLODipine  10 MG tablet Commonly known as: NORVASC  Take 1 tablet (10 mg total) by mouth daily.   benzonatate  100 MG capsule Commonly known as: TESSALON  Take 1 capsule (100 mg total) by mouth 3 (three) times daily.   cetirizine  10 MG tablet Commonly known as: ZYRTEC  Take 1 tablet (10 mg total) by mouth daily.   ciprofloxacin  500 MG tablet Commonly known as: Cipro  Take 1 tablet (500 mg total) by mouth 2 (two) times daily for 4 days.   cromolyn 4 % ophthalmic solution Commonly known as: OPTICROM Place 2 drops into both eyes 2 (two) times daily as needed (allergies).   EPINEPHrine  0.3 mg/0.3 mL Soaj injection Commonly known as: EpiPen  2-Pak Inject 0.3 mLs (0.3 mg total) into the muscle as needed for anaphylaxis.   fluticasone  50 MCG/ACT nasal spray Commonly known as: FLONASE  Place 1 spray into both nostrils daily. What changed:  when to take this reasons to take this   gabapentin  300 MG capsule Commonly known as: NEURONTIN  Take 1 capsule (300 mg total) by mouth at bedtime. What changed: when to take this   hydrOXYzine  25 MG capsule Commonly known as: VISTARIL  TAKE 1 CAPSULE(25 MG) BY MOUTH DAILY AS NEEDED FOR ANXIETY   ibuprofen  600 MG tablet Commonly known as: ADVIL  Take 1 tablet (600 mg total) by mouth every 6 (six) hours.   Linzess  290 MCG Caps capsule Generic drug: linaclotide  TAKE 1 CAPSULE(290 MCG) BY MOUTH DAILY BEFORE BREAKFAST   meclizine  25 MG tablet Commonly known as: ANTIVERT  TAKE 1 TABLET(25 MG) BY MOUTH  TWICE DAILY AS NEEDED FOR DIZZINESS   metFORMIN  500 MG tablet Commonly known as: GLUCOPHAGE  Take 1 tablet (500 mg total) by mouth daily with breakfast.   methocarbamol 750 MG tablet Commonly known as: ROBAXIN Take 1 tablet (750 mg total) by mouth 3 (three) times daily for 15 days.   omeprazole  20 MG capsule Commonly known as: PRILOSEC Take 1 capsule (20 mg total) by mouth 2 (two) times daily before a meal.   oxyCODONE  5 MG immediate release tablet Commonly known as: Oxy IR/ROXICODONE  Take 2 tablets (10 mg total) by mouth every 4 (four) hours as needed for up to 3 days for moderate pain (pain score 4-6) or severe pain (pain score 7-10) (5mg  for moderate pain, 10mg  for severe pain). What changed:  how much to take when to take this reasons to take this   rosuvastatin  20 MG tablet Commonly known as: CRESTOR  Take 1 tablet (20 mg total) by mouth daily.        Follow-up Information     Maczis, Puja Gosai, PA-C Follow up.   Specialty: General Surgery Why: Call to confirm your appointment date and time, bring a copy of your photo ID and insurance card, arrive 30 minutes prior to your appointment Contact information: [address] [place] [address] [phone]  Vicci Barnie NOVAK, MD Follow up in 1 week(s).   Specialty: Internal Medicine Contact information: [address] [place] [address] [phone]                Allergies  Allergen Reactions   Acetaminophen  Itching and Rash    Other Reaction(s): Not available   Ampicillin Anaphylaxis   Atorvastatin  Calcium  Other (See Comments)    Pt reported lip swelling   Penicillins Anaphylaxis   Sulfonamide Derivatives Anaphylaxis   Tramadol Itching and Rash   Hydrocodone -Acetaminophen  Hives and Other (See Comments)    Have to take a Benadryl  to take med    Consultations: General surgery Gastroenterology   Procedures/Studies: DG ERCP Result Date: 08/02/2024 CLINICAL  DATA:  [id number] Surgery, elective [id number] EXAM: ERCP COMPARISON:  CT AP, 08/01/2024.  US  Abdomen, 08/01/2024 FLUOROSCOPY: Exposure Index (as provided by the fluoroscopic device): 5.3 mGy Kerma FINDINGS: Limited oblique planar images of the RIGHT upper quadrant obtained C-arm. Images demonstrating flexible endoscopy, biliary duct cannulation, sphincterotomy, retrograde cholangiogram and balloon sweep. No biliary ductal dilation. No evidence of biliary filling defect is demonstrated. IMPRESSION: Fluoroscopic imaging for ERCP For complete description of intra procedural findings, please see performing service dictation. Electronically Signed   By: Thom Hall M.D.   On: 08/02/2024 07:42   US  Abdomen Limited Result Date: 08/01/2024 CLINICAL DATA:  Right-sided pain with elevated liver enzymes. EXAM: ULTRASOUND ABDOMEN LIMITED RIGHT UPPER QUADRANT COMPARISON:  February 13, 2017 FINDINGS: Gallbladder: Multiple large, shadowing, mobile echogenic gallstones are seen within the lumen of a distended gallbladder (measures approximately 11.1 cm in length). The largest measures approximately 1.8 cm. There is no evidence of gallbladder wall thickening (2.2 mm). No sonographic Murphy sign noted by sonographer. Common bile duct: Diameter: 6.5 mm Liver: No focal lesion identified. Mildly increased echogenicity of the liver parenchyma is seen. Portal vein is patent on color Doppler imaging with normal direction of blood flow towards the liver. Other: None. IMPRESSION: 1. Cholelithiasis within a distended gallbladder, without evidence of acute cholecystitis. 2. Hepatic steatosis. Electronically Signed   By: Suzen Dials M.D.   On: 08/01/2024 02:24   CT ABDOMEN PELVIS W CONTRAST Result Date: 08/01/2024 EXAM: CT ABDOMEN AND PELVIS WITH CONTRAST 08/01/2024 12:17:24 AM TECHNIQUE: CT of the abdomen and pelvis was performed with the administration of 75 mL of iohexol  (OMNIPAQUE ) 350 MG/ML injection. Multiplanar reformatted images are  provided for review. Automated exposure control, iterative reconstruction, and/or weight-based adjustment of the mA/kV was utilized to reduce the radiation dose to as low as reasonably achievable. COMPARISON: CT abdomen and pelvis 05/03/2023. CLINICAL HISTORY: RLQ abdominal pain. FINDINGS: LOWER CHEST: Airspace opacities in the bilateral lower lobes may be due to atelectasis or pneumonia. LIVER: New dilation of the intrahepatic bile ducts. GALLBLADDER AND BILE DUCTS: Mild distention of the gallbladder. No radiopaque stone. No gallbladder wall thickening or pericholecystic fluid. The common bile duct measures 8 mm. SPLEEN: No acute abnormality. PANCREAS: No evidence of pancreatitis. No pancreatic ductal dilation. ADRENAL GLANDS: No acute abnormality. KIDNEYS, URETERS AND BLADDER: Nonobstructing left nephrolithiasis. No hydronephrosis. No perinephric or periureteral stranding. Urinary bladder is unremarkable. GI AND BOWEL: Stomach demonstrates no acute abnormality. Sigmoid diverticulosis without evidence of acute diverticulitis. Normal appendix. There is no bowel obstruction. PERITONEUM AND RETROPERITONEUM: No ascites. No free air. VASCULATURE: Aorta is normal in caliber. LYMPH NODES: No lymphadenopathy. REPRODUCTIVE ORGANS: No acute abnormality. BONES AND SOFT TISSUES: No acute osseous abnormality. Neuro stimulator is present in the right posterior back.  No focal soft tissue abnormality. IMPRESSION: 1. Mildly distended gallbladder with new intra- and extrahepatic biliary dilation compared to CT 02/28 to 12/07/2022. No evidence of acute cholecystitis or radiopaque stone. Recommend correlation with LFTs and consider ultrasound or MRCP for further evaluation. 2. Bilateral lower lobe airspace opacities, which may represent atelectasis or pneumonia. Electronically signed by: Norman Gatlin MD 08/01/2024 12:58 AM EDT RP Workstation: [id number]   ERCP/laparoscopy cholecystectomy.   Subjective: Patient was seen and  examined at bedside.  Overnight events noted. Patient reports pain is much better,  tolerated soft diet , She wants to be discharged home.  Discharge Exam: Vitals:   08/03/24 0337 08/03/24 0741  BP: 122/85 121/83  Pulse: 66 72  Resp:  16  Temp: 98.4 F (36.9 C) 97.8 F (36.6 C)  SpO2: 96% 98%   Vitals:   08/02/24 1242 08/02/24 1349 08/03/24 0337 08/03/24 0741  BP: 133/81 132/81 122/85 121/83  Pulse: 89 90 66 72  Resp: 20   16  Temp: 98 F (36.7 C) 98.5 F (36.9 C) 98.4 F (36.9 C) 97.8 F (36.6 C)  TempSrc: Oral Oral Oral Oral  SpO2: 97% 97% 96% 98%  Weight:      Height:        General: Pt is alert, awake, not in acute distress Cardiovascular: RRR, S1/S2 +, no rubs, no gallops Respiratory: CTA bilaterally, no wheezing, no rhonchi Abdominal: Soft, NT, ND, bowel sounds + status post laparoscopic cholecystectomy. Extremities: no edema, no cyanosis    The results of significant diagnostics from this hospitalization (including imaging, microbiology, ancillary and laboratory) are listed below for reference.     Microbiology: Recent Results (from the past 240 hours)  MRSA Next Gen by PCR, Nasal     Status: None   Collection Time: 08/02/24  5:33 AM   Specimen: Nasal Mucosa; Nasal Swab  Result Value Ref Range Status   MRSA by PCR Next Gen NOT DETECTED NOT DETECTED Final    Comment: (NOTE) The GeneXpert MRSA Assay (FDA approved for NASAL specimens only), is one component of a comprehensive MRSA colonization surveillance program. It is not intended to diagnose MRSA infection nor to guide or monitor treatment for MRSA infections. Test performance is not FDA approved in patients less than 95 years old. Performed at [facility], [address], [place], [place] [address]      Labs: BNP (last 3 results) No results for input(s): BNP in the last 8760 hours. Basic Metabolic Panel: Recent Labs  Lab 07/31/24 2220 08/01/24 0613 08/02/24 0524 08/03/24 0347  NA  138 135 140 138  K 3.6 3.4* 4.2 4.8  CL 101 100 102 105  CO2 26 23 28 24   GLUCOSE 127* 163* 113* 119*  BUN 9 6 10 13   CREATININE 0.82 0.63 0.74 0.81  CALCIUM  9.4 8.7* 9.2 9.0  MG  --   --  1.8  --   PHOS  --   --  3.8  --    Liver Function Tests: Recent Labs  Lab 07/31/24 2220 08/01/24 0613 08/02/24 0524 08/03/24 0347  AST 937* 510* 173* 123*  ALT 852* 697* 503* 369*  ALKPHOS 259* 232* 205* 194*  BILITOT 2.1* 3.2* 4.4* 1.1  PROT 7.4 6.9 6.9 6.9  ALBUMIN 4.0 3.6 3.4* 3.1*   Recent Labs  Lab 07/31/24 2220 08/01/24 1706 08/03/24 0347  LIPASE 27 591* 19   No results for input(s): AMMONIA in the last 168 hours. CBC: Recent Labs  Lab 07/31/24  2220 08/01/24 0613 08/02/24 0524 08/03/24 0347  WBC 9.8 17.8* 17.1* 13.7*  NEUTROABS 8.4*  --   --   --   HGB 12.8 12.4 12.3 12.5  HCT 39.2 36.7 36.7 37.6  MCV 90.5 88.2 88.2 88.3  PLT 225 196 199 221   Cardiac Enzymes: No results for input(s): CKTOTAL, CKMB, CKMBINDEX, TROPONINI in the last 168 hours. BNP: Invalid input(s): POCBNP CBG: Recent Labs  Lab 08/01/24 1349 08/02/24 1017 08/02/24 1152  GLUCAP 135* 104* 116*   D-Dimer No results for input(s): DDIMER in the last 72 hours. Hgb A1c No results for input(s): HGBA1C in the last 72 hours. Lipid Profile No results for input(s): CHOL, HDL, LDLCALC, TRIG, CHOLHDL, LDLDIRECT in the last 72 hours. Thyroid  function studies No results for input(s): TSH, T4TOTAL, T3FREE, THYROIDAB in the last 72 hours.  Invalid input(s): FREET3 Anemia work up No results for input(s): VITAMINB12, FOLATE, FERRITIN, TIBC, IRON, RETICCTPCT in the last 72 hours. Urinalysis    Component Value Date/Time   COLORURINE YELLOW 08/01/2024 0131   APPEARANCEUR CLEAR 08/01/2024 0131   APPEARANCEUR Clear 02/12/2022 0937   LABSPEC 1.042 (H) 08/01/2024 0131   PHURINE 7.0 08/01/2024 0131   GLUCOSEU NEGATIVE 08/01/2024 0131   HGBUR NEGATIVE  08/01/2024 0131   BILIRUBINUR NEGATIVE 08/01/2024 0131   BILIRUBINUR small (A) 02/29/2024 1036   BILIRUBINUR Negative 02/12/2022 0937   KETONESUR 5 (A) 08/01/2024 0131   PROTEINUR NEGATIVE 08/01/2024 0131   UROBILINOGEN 4.0 (A) 02/29/2024 1036   UROBILINOGEN 0.2 12/01/2014 1752   NITRITE NEGATIVE 08/01/2024 0131   LEUKOCYTESUR NEGATIVE 08/01/2024 0131   Sepsis Labs Recent Labs  Lab 07/31/24 2220 08/01/24 0613 08/02/24 0524 08/03/24 0347  WBC 9.8 17.8* 17.1* 13.7*   Microbiology Recent Results (from the past 240 hours)  MRSA Next Gen by PCR, Nasal     Status: None   Collection Time: 08/02/24  5:33 AM   Specimen: Nasal Mucosa; Nasal Swab  Result Value Ref Range Status   MRSA by PCR Next Gen NOT DETECTED NOT DETECTED Final    Comment: (NOTE) The GeneXpert MRSA Assay (FDA approved for NASAL specimens only), is one component of a comprehensive MRSA colonization surveillance program. It is not intended to diagnose MRSA infection nor to guide or monitor treatment for MRSA infections. Test performance is not FDA approved in patients less than 26 years old. Performed at [facility], [address], [place], [place] [address]      Time coordinating discharge: Over 30 minutes  SIGNED:   Darcel Dawley, MD  [facility] 08/03/2024, 10:00 AM Pager   If 7PM-7AM, please contact night-coverage

## 2024-08-03 NOTE — Progress Notes (Signed)
 Discharge instructions (including medications) discussed with and copy provided to patient and she verbalized understanding. TOC medications to be picked up on the way out.

## 2024-08-03 NOTE — Plan of Care (Signed)
  Problem: Pain Managment: Goal: General experience of comfort will improve and/or be controlled Outcome: Progressing   Problem: Safety: Goal: Ability to remain free from injury will improve Outcome: Progressing

## 2024-08-03 NOTE — Discharge Instructions (Addendum)
 [facility]  Please arrive at least 30 min before your appointment to complete your check in paperwork.  If you are unable to arrive 30 min prior to your appointment time we may have to cancel or reschedule you. LAPAROSCOPIC SURGERY: POST OP INSTRUCTIONS Always review your discharge instruction sheet given to you by the facility where your surgery was performed. IF YOU HAVE DISABILITY OR FAMILY LEAVE FORMS, YOU MUST BRING THEM TO THE OFFICE FOR PROCESSING.   DO NOT GIVE THEM TO YOUR DOCTOR.  PAIN CONTROL  First take acetaminophen  (Tylenol ) AND/or ibuprofen  (Advil ) to control your pain after surgery.  Follow directions on package.  Taking acetaminophen  (Tylenol ) and/or ibuprofen  (Advil ) regularly after surgery will help to control your pain and lower the amount of prescription pain medication you may need.  You should not take more than 4,000 mg (4 grams) of acetaminophen  (Tylenol ) in 24 hours.  You should not take ibuprofen  (Advil ), aleve , motrin , naprosyn  or other NSAIDS if you have a history of stomach ulcers or chronic kidney disease. Do not take acetaminophen  (Tylenol ) if you have a known allergy to this medication A prescription for pain medication may be given to you upon discharge.  Take your pain medication as prescribed, if you still have uncontrolled pain after taking acetaminophen  (Tylenol ) or ibuprofen  (Advil ). Use ice packs to help control pain. If you need a refill on your pain medication, please contact your pharmacy.  They will contact our office to request authorization. Prescriptions will not be filled after 5pm or on week-ends.  HOME MEDICATIONS Take your usually prescribed medications unless otherwise directed.  DIET You should follow a light diet the first few days after arrival home.  Be sure to include lots of fluids daily. Avoid fatty, fried foods.   CONSTIPATION It is common to experience some constipation after surgery and if you are taking pain  medication.  Increasing fluid intake and taking a stool softener (such as Colace) will usually help or prevent this problem from occurring.  A mild laxative (Milk of Magnesia or Miralax ) should be taken according to package instructions if there are no bowel movements after 48 hours.  WOUND/INCISION CARE Most patients will experience some swelling and bruising in the area of the incisions.  Ice packs will help.  Swelling and bruising can take several days to resolve.  Unless discharge instructions indicate otherwise, follow guidelines below  STERI-STRIPS - you may remove your outer bandages 48 hours after surgery, and you may shower at that time.  You have steri-strips (small skin tapes) in place directly over the incision.  These strips should be left on the skin for 7-10 days.   DERMABOND/SKIN GLUE - you may shower in 24 hours.  The glue will flake off over the next 2-3 weeks. Any sutures or staples will be removed at the office during your follow-up visit.  ACTIVITIES You may resume regular (light) daily activities beginning the next day--such as daily self-care, walking, climbing stairs--gradually increasing activities as tolerated.  You may have sexual intercourse when it is comfortable.  Refrain from any heavy lifting or straining until approved by your doctor. You may drive when you are no longer taking prescription pain medication, you can comfortably wear a seatbelt, and you can safely maneuver your car and apply brakes.  FOLLOW-UP You should see your doctor in the office for a follow-up appointment approximately 2-3 weeks after your surgery.  You should have been given your post-op/follow-up appointment when your surgery  was scheduled.  If you did not receive a post-op/follow-up appointment, make sure that you call for this appointment within a day or two after you arrive home to insure a convenient appointment time.   WHEN TO CALL YOUR DOCTOR: Fever over 101.0 Inability to  urinate Continued bleeding from incision. Increased pain, redness, or drainage from the incision. Increasing abdominal pain  The clinic staff is available to answer your questions during regular business hours.  Please don't hesitate to call and ask to speak to one of the nurses for clinical concerns.  If you have a medical emergency, go to the nearest emergency room or call 911.  A surgeon from [facility] is always on call at the hospital. [address], [place], [place]  [address] ? [address] [address], [place], [place]   [address] [phone] ? [phone] ? FAX [phone]

## 2024-08-04 ENCOUNTER — Telehealth: Payer: Self-pay | Admitting: *Deleted

## 2024-08-04 LAB — SURGICAL PATHOLOGY

## 2024-08-04 NOTE — Transitions of Care (Post Inpatient/ED Visit) (Signed)
   08/04/2024  Name: [name] MRN: [id number] DOB: April 13, 1966  Today's TOC FU Call Status: Today's TOC FU Call Status:: Successful TOC FU Call Completed TOC FU Call Complete Date: 08/04/24 Patient's Name and Date of Birth confirmed.  Transition Care Management Follow-up Telephone Call Date of Discharge: 08/03/24 Discharge Facility: [facility]) Type of Discharge: Inpatient Admission Primary Inpatient Discharge Diagnosis:: Cholelithiasis with choledocholithiasis How have you been since you were released from the hospital?: Better Any questions or concerns?: No  Items Reviewed: Did you receive and understand the discharge instructions provided?: Yes Medications obtained,verified, and reconciled?: Yes (Medications Reviewed) Any new allergies since your discharge?: No Dietary orders reviewed?: Yes Type of Diet Ordered:: low sodium, heart healthy, avoid greasy food Do you have support at home?: Yes People in Home [RPT]: parent(s) Name of Support/Comfort Primary Source: Mother/Cora  Medications Reviewed Today: Medications Reviewed Today   Medications were not reviewed in this encounter     Home Care and Equipment/Supplies: Were Home Health Services Ordered?: No Any new equipment or medical supplies ordered?: No  Functional Questionnaire: Do you need assistance with bathing/showering or dressing?: No Do you need assistance with meal preparation?: No Do you need assistance with eating?: No Do you have difficulty maintaining continence: No Do you need assistance with getting out of bed/getting out of a chair/moving?: No Do you have difficulty managing or taking your medications?: No  Follow up appointments reviewed: PCP Follow-up appointment confirmed?: No (RNCM assisted with scheduling Hospital follow up) MD Provider Line Number:[phone] Given: No Specialist Hospital Follow-up appointment confirmed?: Yes Date of Specialist follow-up appointment?: 08/26/24 Follow-Up  Specialty Provider:: General Surgery post op Do you need transportation to your follow-up appointment?: No Do you understand care options if your condition(s) worsen?: Yes-patient verbalized understanding  SDOH Interventions Today    Flowsheet Row Most Recent Value  SDOH Interventions   Food Insecurity Interventions Intervention Not Indicated  Housing Interventions Intervention Not Indicated  Transportation Interventions Intervention Not Indicated  Utilities Interventions Intervention Not Indicated    Andrea Dimes RN, BSN [place]  [facility] RN Care Manager [phone]

## 2024-08-13 ENCOUNTER — Other Ambulatory Visit: Payer: Self-pay | Admitting: Internal Medicine

## 2024-08-13 ENCOUNTER — Other Ambulatory Visit: Payer: Self-pay | Admitting: Nurse Practitioner

## 2024-08-13 DIAGNOSIS — J302 Other seasonal allergic rhinitis: Secondary | ICD-10-CM

## 2024-08-13 DIAGNOSIS — R051 Acute cough: Secondary | ICD-10-CM

## 2024-08-13 DIAGNOSIS — J3089 Other allergic rhinitis: Secondary | ICD-10-CM

## 2024-08-14 ENCOUNTER — Other Ambulatory Visit: Payer: Self-pay | Admitting: Internal Medicine

## 2024-08-14 DIAGNOSIS — E1169 Type 2 diabetes mellitus with other specified complication: Secondary | ICD-10-CM

## 2024-08-14 DIAGNOSIS — K219 Gastro-esophageal reflux disease without esophagitis: Secondary | ICD-10-CM

## 2024-08-18 ENCOUNTER — Encounter: Payer: Self-pay | Admitting: Family Medicine

## 2024-08-18 ENCOUNTER — Ambulatory Visit (INDEPENDENT_AMBULATORY_CARE_PROVIDER_SITE_OTHER): Admitting: Family Medicine

## 2024-08-18 VITALS — BP 122/79 | HR 66 | Ht 64.0 in | Wt 143.6 lb

## 2024-08-18 DIAGNOSIS — Z09 Encounter for follow-up examination after completed treatment for conditions other than malignant neoplasm: Secondary | ICD-10-CM

## 2024-08-18 DIAGNOSIS — Z9049 Acquired absence of other specified parts of digestive tract: Secondary | ICD-10-CM

## 2024-08-18 NOTE — Progress Notes (Unsigned)
 Established Patient Office Visit  Subjective    Patient ID: [name], female    DOB: 20-Mar-1966  Age: 58 y.o. MRN: [id number]  CC:  Chief Complaint  Patient presents with   Hospitalization Follow-up    Pt reports still in pain     HPI [name] presents for routine hospital discharge follow up after cholecystectomy. She reports that she is improving daily and she denies acute complaints.   Outpatient Encounter Medications as of 08/18/2024  Medication Sig   albuterol  (VENTOLIN  HFA) 108 (90 Base) MCG/ACT inhaler Inhale 2 puffs into the lungs every 6 (six) hours as needed for wheezing or shortness of breath.   amLODipine  (NORVASC ) 10 MG tablet Take 1 tablet (10 mg total) by mouth daily.   benzonatate  (TESSALON ) 100 MG capsule TAKE 1 CAPSULE(100 MG) BY MOUTH THREE TIMES DAILY   cetirizine  (ZYRTEC ) 10 MG tablet TAKE 1 TABLET(10 MG) BY MOUTH DAILY   cromolyn (OPTICROM) 4 % ophthalmic solution Place 2 drops into both eyes 2 (two) times daily as needed (allergies).   diphenhydrAMINE  (SOMINEX) 25 MG tablet Take 25 mg by mouth at bedtime as needed for sleep.   EPINEPHrine  (EPIPEN  2-PAK) 0.3 mg/0.3 mL IJ SOAJ injection Inject 0.3 mLs (0.3 mg total) into the muscle as needed for anaphylaxis.   fluticasone  (FLONASE ) 50 MCG/ACT nasal spray Place 1 spray into both nostrils daily.   gabapentin  (NEURONTIN ) 300 MG capsule Take 1 capsule (300 mg total) by mouth at bedtime. (Patient taking differently: Take 300 mg by mouth daily as needed.)   ibuprofen  (ADVIL ) 600 MG tablet Take 1 tablet (600 mg total) by mouth every 6 (six) hours.   LINZESS  290 MCG CAPS capsule TAKE 1 CAPSULE(290 MCG) BY MOUTH DAILY BEFORE BREAKFAST   meclizine  (ANTIVERT ) 25 MG tablet TAKE 1 TABLET(25 MG) BY MOUTH TWICE DAILY AS NEEDED FOR DIZZINESS   metFORMIN  (GLUCOPHAGE ) 500 MG tablet Take 1 tablet (500 mg total) by mouth daily with breakfast.   [EXPIRED] methocarbamol (ROBAXIN) 750 MG tablet Take 1 tablet (750 mg total) by  mouth 3 (three) times daily for 15 days.   omeprazole  (PRILOSEC) 20 MG capsule TAKE 1 CAPSULE(20 MG) BY MOUTH TWICE DAILY BEFORE A MEAL   rosuvastatin  (CRESTOR ) 20 MG tablet TAKE 1 TABLET(20 MG) BY MOUTH DAILY   hydrOXYzine  (VISTARIL ) 25 MG capsule TAKE 1 CAPSULE(25 MG) BY MOUTH DAILY AS NEEDED FOR ANXIETY (Patient not taking: Reported on 08/18/2024)   No facility-administered encounter medications on file as of 08/18/2024.    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 Claritin    Vertigo     Past Surgical History:  Procedure Laterality Date   ANAL FISTULECTOMY  08/22/2012   Procedure: FISTULECTOMY ANAL;  Surgeon: Redell Faith, DO;  Location: [facility] OR;  Service: General;  Laterality: N/A;  rectal examination under anesthesia with seton placement    ANAL FISTULECTOMY  09/17/2012   Procedure: FISTULECTOMY ANAL;  Surgeon: Redell Faith, DO;  Location: [facility];  Service: General;  Laterality: N/A;  possible fistulotomy   BACK SURGERY  10/19/2022   CHOLECYSTECTOMY N/A 08/02/2024   Procedure: LAPAROSCOPIC CHOLECYSTECTOMY;  Surgeon: Rubin Calamity, MD;  Location: [facility];  Service: General;  Laterality:  N/A;   D & C HYSTEROSCOPY/ NOVASURE ENDOMETRIAL ABLATION/ I & D THROMBOSED HEMORROID  01-10-2007  DR PEGGYE ROSS   ERCP N/A 08/01/2024   Procedure: ERCP, WITH INTERVENTION IF INDICATED;  Surgeon: Charlanne Groom, MD;  Location: [facility];  Service: Gastroenterology;  Laterality: N/A;   EXAMINATION UNDER ANESTHESIA  09/17/2012   Procedure: EXAM UNDER ANESTHESIA;  Surgeon: Redell Faith, DO;  Location: [facility];  Service: General;  Laterality: N/A;  Rectal exam under anesthesia   EXAMINATION UNDER ANESTHESIA N/A 05/14/2013   Procedure:  EXAM UNDER ANESTHESIA;  Surgeon: Bernarda Ned, MD;  Location: [facility];  Service: General;  Laterality: N/A;   FISTULA PLUG  09/17/2012   Procedure: FISTULA PLUG;  Surgeon: Redell Faith, DO;  Location: [facility];  Service: General;  Laterality: N/A;  Possible fistula plug   HEMORRHOID SURGERY  04/28/2009   [facility] INTERNAL HEMORRHOIDS   INDOCYANINE GREEN FLUORESCENCE IMAGING (ICG) N/A 08/02/2024   Procedure: INDOCYANINE GREEN FLUORESCENCE IMAGING (ICG);  Surgeon: Rubin Calamity, MD;  Location: [facility] OR;  Service: General;  Laterality: N/A;   IRRIGATION AND DEBRIDEMENT ABSCESS  07/08/2012   Procedure: MINOR INCISION AND DRAINAGE OF ABSCESS;  Surgeon: Hargis Paradise, MD;  [facility];  Service: Gynecology;  Laterality: N/A;  Vulvar Abscess   PLACEMENT OF SETON N/A 05/14/2013   Procedure:  PLACEMENT OF SETON;  Surgeon: Bernarda Ned, MD;  Location: [facility];  Service: General;  Laterality: N/A;   RECTAL EXAM UNDER ANESTHESIA N/A 08/20/2013   Procedure: mucosal advancement flap ;  Surgeon: Bernarda Ned, MD;  Location: [facility];  Service: General;  Laterality: N/A;  parks anal retractor long rectal instrucments prone jack knife anal fistula    RECTAL ULTRASOUND N/A 07/20/2013   Procedure: RECTAL ULTRASOUND;  Surgeon: Bernarda Ned, MD;  Location: [facility];  Service: Endoscopy;  Laterality: N/A;    Family History  Problem Relation Age of Onset   Diabetes Mother    Hypertension Mother    Heart attack Father    Seizures Sister    Seizures Sister    Diabetes Brother    Hypertension Brother    Diabetes Maternal Aunt    Diabetes Maternal Uncle    Hypertension Maternal Uncle    Hypertension Son    Colon cancer Neg Hx    Stomach cancer Neg Hx     Social History   Socioeconomic History   Marital status: Single    Spouse name: Not on file   Number of children: 2   Years of education: 10   Highest education level: Not  on file  Occupational History   Occupation: N/A  Tobacco Use   Smoking status: Never   Smokeless tobacco: Never  Vaping Use   Vaping status: Never Used  Substance and Sexual Activity   Alcohol use: Never   Drug use: No   Sexual activity: Yes  Other Topics Concern   Not on file  Social History Narrative   Lives at home w/ her mother   Right-handed   Caffeine: 1 cup of tea per day   Social Drivers of Health   Financial Resource Strain: Low Risk  (08/23/2023)   Overall Financial Resource Strain (CARDIA)    Difficulty of Paying Living Expenses: Not very hard  Food Insecurity: No Food Insecurity (08/04/2024)   Hunger Vital Sign    Worried About Running Out of Food in the Last Year: Never true    Ran  Out of Food in the Last Year: Never true  Recent Concern: Food Insecurity - Food Insecurity Present (08/01/2024)   Hunger Vital Sign    Worried About Running Out of Food in the Last Year: Sometimes true    Ran Out of Food in the Last Year: Sometimes true  Transportation Needs: No Transportation Needs (08/04/2024)   PRAPARE - Administrator, Civil Service (Medical): No    Lack of Transportation (Non-Medical): No  Physical Activity: Insufficiently Active (08/23/2023)   Exercise Vital Sign    Days of Exercise per Week: 5 days    Minutes of Exercise per Session: 20 min  Stress: Stress Concern Present (08/23/2023)   [employer] of Occupational Health - Occupational Stress Questionnaire    Feeling of Stress : To some extent  Social Connections: Moderately Integrated (08/23/2023)   Social Connection and Isolation Panel    Frequency of Communication with Friends and Family: More than three times a week    Frequency of Social Gatherings with Friends and Family: More than three times a week    Attends Religious Services: More than 4 times per year    Active Member of [employer] or Organizations: Yes    Attends Banker Meetings: Never    Marital Status: Never  married  Intimate Partner Violence: Not At Risk (08/04/2024)   Humiliation, Afraid, Rape, and Kick questionnaire    Fear of Current or Ex-Partner: No    Emotionally Abused: No    Physically Abused: No    Sexually Abused: No    Review of Systems  All other systems reviewed and are negative.       Objective    BP 122/79   Pulse 66   Ht 5' 4 (1.626 m)   Wt 143 lb 9.6 oz (65.1 kg)   LMP 03/08/2018   SpO2 97%   BMI 24.65 kg/m   Physical Exam Vitals and nursing note reviewed.  Constitutional:      General: She is not in acute distress. Cardiovascular:     Rate and Rhythm: Normal rate and regular rhythm.  Pulmonary:     Effort: Pulmonary effort is normal.     Breath sounds: Normal breath sounds.  Abdominal:     Palpations: Abdomen is soft.     Tenderness: There is no abdominal tenderness.     Comments: Healing surgical scars noted   Neurological:     General: No focal deficit present.     Mental Status: She is alert and oriented to person, place, and time.         Assessment & Plan:   S/P cholecystectomy -     CBC with Differential/Platelet -     Basic metabolic panel with [facility]  Hospital discharge follow-up     No follow-ups on file.   Tanda Raguel SQUIBB, MD

## 2024-08-19 ENCOUNTER — Ambulatory Visit: Payer: Self-pay | Admitting: Family Medicine

## 2024-08-19 LAB — BASIC METABOLIC PANEL WITH GFR
BUN/Creatinine Ratio: 15 (ref 9–23)
BUN: 11 mg/dL (ref 6–24)
CO2: 23 mmol/L (ref 20–29)
Calcium: 9.5 mg/dL (ref 8.7–10.2)
Chloride: 107 mmol/L — ABNORMAL HIGH (ref 96–106)
Creatinine, Ser: 0.74 mg/dL (ref 0.57–1.00)
Glucose: 94 mg/dL (ref 70–99)
Potassium: 4 mmol/L (ref 3.5–5.2)
Sodium: 145 mmol/L — ABNORMAL HIGH (ref 134–144)
eGFR: 93 mL/min/1.73 (ref >59)

## 2024-08-19 LAB — CBC WITH DIFFERENTIAL/PLATELET
Basophils Absolute: 0.1 x10E3/uL (ref 0.0–0.2)
Basos: 2 %
EOS (ABSOLUTE): 0.2 x10E3/uL (ref 0.0–0.4)
Eos: 4 %
Hematocrit: 39.4 % (ref 34.0–46.6)
Hemoglobin: 12.3 g/dL (ref 11.1–15.9)
Immature Grans (Abs): 0 x10E3/uL (ref 0.0–0.1)
Immature Granulocytes: 0 %
Lymphocytes Absolute: 2.6 x10E3/uL (ref 0.7–3.1)
Lymphs: 57 %
MCH: 29 pg (ref 26.6–33.0)
MCHC: 31.2 g/dL — ABNORMAL LOW (ref 31.5–35.7)
MCV: 93 fL (ref 79–97)
Monocytes Absolute: 0.3 x10E3/uL (ref 0.1–0.9)
Monocytes: 6 %
Neutrophils Absolute: 1.4 x10E3/uL (ref 1.4–7.0)
Neutrophils: 31 %
Platelets: 322 x10E3/uL (ref 150–450)
RBC: 4.24 x10E6/uL (ref 3.77–5.28)
RDW: 12.8 % (ref 11.7–15.4)
WBC: 4.6 x10E3/uL (ref 3.4–10.8)

## 2024-08-20 ENCOUNTER — Other Ambulatory Visit: Payer: Self-pay | Admitting: Nurse Practitioner

## 2024-08-20 DIAGNOSIS — E1159 Type 2 diabetes mellitus with other circulatory complications: Secondary | ICD-10-CM

## 2024-09-07 ENCOUNTER — Other Ambulatory Visit: Payer: Self-pay | Admitting: Internal Medicine

## 2024-09-07 DIAGNOSIS — Z1231 Encounter for screening mammogram for malignant neoplasm of breast: Secondary | ICD-10-CM

## 2024-09-29 ENCOUNTER — Ambulatory Visit
Admission: RE | Admit: 2024-09-29 | Discharge: 2024-09-29 | Disposition: A | Discharge status: Home / Self Care | Source: Ambulatory Visit | Attending: Internal Medicine | Admitting: Internal Medicine

## 2024-09-29 DIAGNOSIS — Z1231 Encounter for screening mammogram for malignant neoplasm of breast: Secondary | ICD-10-CM

## 2024-10-03 ENCOUNTER — Other Ambulatory Visit: Payer: Self-pay | Admitting: Internal Medicine

## 2024-10-03 DIAGNOSIS — F411 Generalized anxiety disorder: Secondary | ICD-10-CM

## 2024-10-05 ENCOUNTER — Other Ambulatory Visit: Payer: Self-pay | Admitting: Nurse Practitioner

## 2024-10-05 DIAGNOSIS — E1169 Type 2 diabetes mellitus with other specified complication: Secondary | ICD-10-CM

## 2024-10-09 ENCOUNTER — Other Ambulatory Visit: Payer: Self-pay | Admitting: Internal Medicine

## 2024-10-09 DIAGNOSIS — R051 Acute cough: Secondary | ICD-10-CM

## 2024-10-09 DIAGNOSIS — J302 Other seasonal allergic rhinitis: Secondary | ICD-10-CM

## 2024-10-09 DIAGNOSIS — K219 Gastro-esophageal reflux disease without esophagitis: Secondary | ICD-10-CM

## 2024-10-09 DIAGNOSIS — J3089 Other allergic rhinitis: Secondary | ICD-10-CM

## 2024-10-09 DIAGNOSIS — I152 Hypertension secondary to endocrine disorders: Secondary | ICD-10-CM

## 2024-10-09 DIAGNOSIS — E1169 Type 2 diabetes mellitus with other specified complication: Secondary | ICD-10-CM

## 2024-10-09 NOTE — Telephone Encounter (Signed)
 Copied from CRM [id number]. Topic: Clinical - Medication Refill >> Oct 09, 2024 10:20 AM Donna BRAVO wrote: Patient needed enough medication to bridge over to appt on 12/07/24  Medication:  metFORMIN  (GLUCOPHAGE ) 500 MG tablet fluticasone  (FLONASE ) 50 MCG/ACT nasal spray cetirizine  (ZYRTEC ) 10 MG tablet  amLODipine  (NORVASC ) 10 MG tablet benzonatate  (TESSALON ) 100 MG capsule omeprazole  (PRILOSEC) 20 MG capsule   Has the patient contacted their pharmacy? Yes Pharmacy stated to call provider  This is the patient's preferred pharmacy:   [id number] - [place], [place] - [address] AT [facility] OF [address] & [address] [place] [address] Phone: [phone] Fax: [phone]   Is this the correct pharmacy for this prescription? Yes If no, delete pharmacy and type the correct one.   Has the prescription been filled recently? Yes  Is the patient out of the medication? No  Has the patient been seen for an appointment in the last year OR does the patient have an upcoming appointment? Yes  Can we respond through MyChart? Yes  Agent: Please be advised that Rx refills may take up to 3 business days. We ask that you follow-up with your pharmacy.

## 2024-10-12 MED ORDER — BENZONATATE 100 MG PO CAPS: 100.0000 mg | ORAL_CAPSULE | Freq: Three times a day (TID) | ORAL | 2 refills | Status: AC | PRN

## 2024-10-12 MED ORDER — FLUTICASONE PROPIONATE 50 MCG/ACT NA SUSP: 1.0000 | Freq: Every day | NASAL | 2 refills | Status: AC

## 2024-10-12 NOTE — Telephone Encounter (Signed)
 Requested medications are due for refill today.  Unsure  Requested medications are on the active medications list.  yes  Last refill. 08/14/2024 #30 0 rf  Future visit scheduled.   yes  Notes to clinic.  Not usually a maintenance medication - please review    Requested Prescriptions  Pending Prescriptions Disp Refills   benzonatate  (TESSALON ) 100 MG capsule 30 capsule 0     Ear, Nose, and Throat:  Antitussives/Expectorants Passed - 10/12/2024  3:24 PM      Passed - Valid encounter within last 12 months    Recent Outpatient Visits           1 month ago S/P cholecystectomy   [facility], MD   5 months ago Acute cough   [facility] [place] - [facility] Dept Of [place]. [facility] Vicci Barnie NOVAK, MD   7 months ago Essential hypertension   [place] [facility] [place] - A Dept Of [place]. [facility] Theotis Haze ORN, NP   1 year ago Diabetes mellitus treated with oral medication [facility])   [place] [facility] - A Dept Of [place]. [facility] Vicci Barnie NOVAK, MD   1 year ago Viral illness   [place] [place] - A Dept Of [place]. [facility] Vicci Barnie NOVAK, MD              Signed Prescriptions Disp Refills   fluticasone  (FLONASE ) 50 MCG/ACT nasal spray 16 g 2    Sig: Place 1 spray into both nostrils daily.     Ear, Nose, and Throat: Nasal Preparations - Corticosteroids Passed - 10/12/2024  3:24 PM      Passed - Valid encounter within last 12 months    Recent Outpatient Visits           1 month ago S/P cholecystectomy   [facility], MD   5 months ago Acute cough   [facility] [place] - [facility] Dept Of [place]. [facility] Vicci Barnie NOVAK, MD   7 months ago Essential hypertension   [place] [facility] [place] - A Dept Of [place]. [facility] Theotis Haze ORN, NP    1 year ago Diabetes mellitus treated with oral medication [facility])   [place] [facility] - A Dept Of [place]. [facility] Vicci Barnie NOVAK, MD   1 year ago Viral illness   [place] [facility] [place] [place] - A Dept Of [place]. [facility] Vicci Barnie NOVAK, MD              Refused Prescriptions Disp Refills   metFORMIN  (GLUCOPHAGE ) 500 MG tablet 90 tablet 1    Sig: Take 1 tablet (500 mg total) by mouth daily with breakfast.     Endocrinology:  Diabetes - Biguanides Failed - 10/12/2024  3:24 PM      Failed - HBA1C is between 0 and 7.9 and within 180 days    HbA1c, POC (controlled diabetic range)  Date Value Ref Range Status  07/11/2023 5.7 0.0 - 7.0 % Final         Failed - B12 Level in normal range and within 720 days    Vitamin B-12  Date Value Ref Range Status  03/18/2009 496 211 - 911 pg/mL Final  Passed - Cr in normal range and within 360 days    Creatinine, Ser  Date Value Ref Range Status  08/18/2024 0.74 0.57 - 1.00 mg/dL Final         Passed - eGFR in normal range and within 360 days    GFR calc Af Amer  Date Value Ref Range Status  01/28/2020 90 >59 mL/min/1.73 Final   GFR, Estimated  Date Value Ref Range Status  08/03/2024 >60 >60 mL/min Final    Comment:    (NOTE) Calculated using the CKD-EPI Creatinine Equation (2021)    GFR  Date Value Ref Range Status  03/01/2015 113.70 >60.00 mL/min Final   eGFR  Date Value Ref Range Status  08/18/2024 93 >59 mL/min/1.73 Final         Passed - Valid encounter within last 6 months    Recent Outpatient Visits           1 month ago S/P cholecystectomy   [facility], MD   5 months ago Acute cough   [place] [facility] [place] - [facility] Dept Of [place]. [facility] Vicci Barnie NOVAK, MD   7 months ago Essential hypertension   [place] [facility] [place] - A Dept Of [place]. [facility] Theotis Haze ORN, NP   1 year ago Diabetes mellitus treated with oral medication [facility])   [facility] [facility] - A Dept Of [place]. [facility] Vicci Barnie NOVAK, MD   1 year ago Viral illness   [place] [facility] [place] - A Dept Of [place]. [facility] Vicci Barnie NOVAK, MD              Passed - CBC within normal limits and completed in the last 12 months    WBC  Date Value Ref Range Status  08/18/2024 4.6 3.4 - 10.8 x10E3/uL Final  08/03/2024 13.7 (H) 4.0 - 10.5 K/uL Final   RBC  Date Value Ref Range Status  08/18/2024 4.24 3.77 - 5.28 x10E6/uL Final  08/03/2024 4.26 3.87 - 5.11 MIL/uL Final   Hemoglobin  Date Value Ref Range Status  08/18/2024 12.3 11.1 - 15.9 g/dL Final   HGB  Date Value Ref Range Status  09/05/2009 13.8 11.6 - 15.9 g/dL Final   HCT  Date Value Ref Range Status  09/05/2009 41.3 34.8 - 46.6 % Final   Hematocrit  Date Value Ref Range Status  08/18/2024 39.4 34.0 - 46.6 % Final   MCHC  Date Value Ref Range Status  08/18/2024 31.2 (L) 31.5 - 35.7 g/dL Final  90/70/7974 66.7 30.0 - 36.0 g/dL Final   [facility]  Date Value Ref Range Status  08/18/2024 29.0 26.6 - 33.0 pg Final  08/03/2024 29.3 26.0 - 34.0 pg Final   MCV  Date Value Ref Range Status  08/18/2024 93 79 - 97 fL Final  09/05/2009 92.0 79.5 - 101.0 fL Final   No results found for: PLTCOUNTKUC, LABPLAT, POCPLA RDW  Date Value Ref Range Status  08/18/2024 12.8 11.7 - 15.4 % Final  09/05/2009 13.0 11.2 - 14.5 % Final          cetirizine  (ZYRTEC ) 10 MG tablet 90 tablet 0     Ear, Nose, and Throat:  Antihistamines 2 Passed - 10/12/2024  3:24 PM      Passed - Cr in normal range and within 360 days    Creatinine, Ser  Date Value Ref Range Status  08/18/2024 0.74 0.57 - 1.00 mg/dL Final         Passed - Valid encounter within last 12 months    Recent Outpatient Visits           1 month ago S/P cholecystectomy   [facility], MD   5 months ago Acute cough   [place] [facility] [place] [place] - A Dept Of [place]. [facility] Vicci Barnie NOVAK, MD   7 months ago Essential hypertension   [place] [facility] [place] - A Dept Of [place]. [place] Theotis Haze ORN, NP   1 year ago Diabetes mellitus treated with oral medication [facility])   [place] [facility] - A Dept Of [place]. [facility] Vicci Barnie NOVAK, MD   1 year ago Viral illness   [place] [facility] [place] - A Dept Of [place]. [facility] Vicci Barnie B, MD               amLODipine  (NORVASC ) 10 MG tablet 90 tablet 1    Sig: Take 1 tablet (10 mg total) by mouth daily.     Cardiovascular: Calcium  Channel Blockers 2 Passed - 10/12/2024  3:24 PM      Passed - Last BP in normal range    BP Readings from Last 1 Encounters:  08/18/24 122/79         Passed - Last Heart Rate in normal range    Pulse Readings from Last 1 Encounters:  08/18/24 66         Passed - Valid encounter within last 6 months    Recent Outpatient Visits           1 month ago S/P cholecystectomy   [place], MD   5 months ago Acute cough   [place] [facility] [place] - [facility] Dept Of [place]. [facility] Vicci Barnie NOVAK, MD   7 months ago Essential hypertension   [place] [facility] [place] - A Dept Of [place]. [facility] Theotis Haze ORN, NP   1 year ago Diabetes mellitus treated with oral medication [facility])   [place] [facility] - A Dept Of [place]. [facility] Vicci Barnie NOVAK, MD   1 year ago Viral illness   [place] [facility] [place] - A Dept Of [place]. [facility] Vicci Barnie NOVAK, MD               omeprazole  (PRILOSEC) 20 MG capsule 180 capsule 0     Gastroenterology: Proton Pump Inhibitors Passed - 10/12/2024  3:24 PM      Passed - Valid encounter within  last 12 months    Recent Outpatient Visits           1 month ago S/P cholecystectomy   [facility] [facility], MD   5 months ago Acute cough   [place] [facility] [place] - A Dept Of [place]. [facility] Vicci Barnie NOVAK, MD   7 months ago Essential hypertension   [place] [facility] [place] - A Dept Of [place]. [facility] Theotis Haze ORN, NP   1 year ago Diabetes mellitus treated with oral medication [facility])   [place] [facility] - A Dept Of  [place]. [facility] Vicci Barnie NOVAK, MD   1 year ago Viral illness   [place] [facility] - A Dept Of [place]. [facility] Vicci Barnie NOVAK, MD               chw

## 2024-10-12 NOTE — Telephone Encounter (Signed)
 Requested Prescriptions  Pending Prescriptions Disp Refills   fluticasone  (FLONASE ) 50 MCG/ACT nasal spray 16 g 3    Sig: Place 1 spray into both nostrils daily.     Ear, Nose, and Throat: Nasal Preparations - Corticosteroids Passed - 10/12/2024  3:23 PM      Passed - Valid encounter within last 12 months    Recent Outpatient Visits           1 month ago S/P cholecystectomy   [place] [facility], MD   5 months ago Acute cough   [place] [facility] [place] - A Dept Of [place]. [facility] Vicci Barnie NOVAK, MD   7 months ago Essential hypertension   [place] [facility] [place] [place] - A Dept Of [place]. [facility] Theotis Haze ORN, NP   1 year ago Diabetes mellitus treated with oral medication [facility])   [place] [facility] - A Dept Of [place]. [facility] Vicci Barnie NOVAK, MD   1 year ago Viral illness   [place] [facility] [place] - A Dept Of [place]. [facility] Vicci Barnie B, MD               benzonatate  (TESSALON ) 100 MG capsule 30 capsule 0     Ear, Nose, and Throat:  Antitussives/Expectorants Passed - 10/12/2024  3:23 PM      Passed - Valid encounter within last 12 months    Recent Outpatient Visits           1 month ago S/P cholecystectomy   [place] [facility], MD   5 months ago Acute cough   [place] [facility] [place] - A Dept Of [place]. [facility] Vicci Barnie NOVAK, MD   7 months ago Essential hypertension   [place] [facility] [place] - A Dept Of [place]. [facility] Theotis Haze ORN, NP   1 year ago Diabetes mellitus treated with oral medication [facility])   [place] [facility] - A Dept Of [place]. [facility] Vicci Barnie NOVAK, MD   1 year ago Viral illness    Comm [facility] [place] - A Dept Of . [facility] Vicci Barnie NOVAK, MD               Refused Prescriptions Disp Refills   metFORMIN  (GLUCOPHAGE ) 500 MG tablet 90 tablet 1    Sig: Take 1 tablet (500 mg total) by mouth daily with breakfast.     Endocrinology:  Diabetes - Biguanides Failed - 10/12/2024  3:23 PM      Failed - HBA1C is between 0 and 7.9 and within 180 days    HbA1c, POC (controlled diabetic range)  Date Value Ref Range Status  07/11/2023 5.7 0.0 - 7.0 % Final         Failed - B12 Level in normal range and within 720 days    Vitamin B-12  Date Value Ref Range Status  03/18/2009 496 211 - 911 pg/mL Final         Passed - Cr in normal range and within 360 days    Creatinine, Ser  Date Value Ref Range Status  08/18/2024 0.74 0.57 - 1.00 mg/dL Final         Passed - eGFR in normal range and within 360 days    GFR calc Af [place]  Date Value Ref Range Status  01/28/2020 90 >59 mL/min/1.73 Final   GFR, Estimated  Date Value Ref Range Status  08/03/2024 >60 >60 mL/min Final    Comment:    (NOTE) Calculated using the CKD-EPI Creatinine Equation (2021)    GFR  Date Value Ref Range Status  03/01/2015 113.70 >60.00 mL/min Final   eGFR  Date Value Ref Range Status  08/18/2024 93 >59 mL/min/1.73 Final         Passed - Valid encounter within last 6 months    Recent Outpatient Visits           1 month ago S/P cholecystectomy   [place] [facility], MD   5 months ago Acute cough   [place] [facility] [place] - A Dept Of [place]. [facility] Vicci Barnie NOVAK, MD   7 months ago Essential hypertension   [place] [facility] [place] - A Dept Of [place]. [facility] Theotis Haze ORN, NP   1 year ago Diabetes mellitus treated with oral medication [facility])   [place] [facility] - A Dept Of [place]. [facility] Vicci Barnie NOVAK, MD   1 year ago Viral illness   [place] [facility] [place] - A Dept Of [place]. [facility] Vicci Barnie NOVAK, MD               Passed - CBC within normal limits and completed in the last 12 months    WBC  Date Value Ref Range Status  08/18/2024 4.6 3.4 - 10.8 x10E3/uL Final  08/03/2024 13.7 (H) 4.0 - 10.5 K/uL Final   RBC  Date Value Ref Range Status  08/18/2024 4.24 3.77 - 5.28 x10E6/uL Final  08/03/2024 4.26 3.87 - 5.11 MIL/uL Final   Hemoglobin  Date Value Ref Range Status  08/18/2024 12.3 11.1 - 15.9 g/dL Final   HGB  Date Value Ref Range Status  09/05/2009 13.8 11.6 - 15.9 g/dL Final   HCT  Date Value Ref Range Status  09/05/2009 41.3 34.8 - 46.6 % Final   Hematocrit  Date Value Ref Range Status  08/18/2024 39.4 34.0 - 46.6 % Final   MCHC  Date Value Ref Range Status  08/18/2024 31.2 (L) 31.5 - 35.7 g/dL Final  90/70/7974 66.7 30.0 - 36.0 g/dL Final   [facility]  Date Value Ref Range Status  08/18/2024 29.0 26.6 - 33.0 pg Final  08/03/2024 29.3 26.0 - 34.0 pg Final   MCV  Date Value Ref Range Status  08/18/2024 93 79 - 97 fL Final  09/05/2009 92.0 79.5 - 101.0 fL Final   No results found for: PLTCOUNTKUC, LABPLAT, POCPLA RDW  Date Value Ref Range Status  08/18/2024 12.8 11.7 - 15.4 % Final  09/05/2009 13.0 11.2 - 14.5 % Final          cetirizine  (ZYRTEC ) 10 MG tablet 90 tablet 0     Ear, Nose, and Throat:  Antihistamines 2 Passed - 10/12/2024  3:23 PM      Passed - Cr in normal range and within 360 days    Creatinine, Ser  Date Value Ref Range Status  08/18/2024 0.74 0.57 - 1.00 mg/dL Final         Passed - Valid encounter within last 12 months    Recent Outpatient Visits           1 month ago S/P cholecystectomy   [facility] [facility], MD   5 months ago Acute cough   [place] [facility] [place] - A Dept Of [place]. [facility] Vicci Barnie NOVAK, MD   7 months ago Essential hypertension   [place] [facility] [place] - A Dept Of [place]. [facility] Theotis Haze ORN, NP   1 year ago  Diabetes mellitus treated with oral medication [facility])   [place] [facility] - A Dept Of [employer]. [facility] Vicci Barnie NOVAK, MD   1 year ago Viral illness   [place] [facility] [place] - A Dept Of [place]. [facility] Vicci Barnie NOVAK, MD               amLODipine  (NORVASC ) 10 MG tablet 90 tablet 1    Sig: Take 1 tablet (10 mg total) by mouth daily.     Cardiovascular: Calcium  Channel Blockers 2 Passed - 10/12/2024  3:23 PM      Passed - Last BP in normal range    BP Readings from Last 1 Encounters:  08/18/24 122/79         Passed - Last Heart Rate in normal range    Pulse Readings from Last 1 Encounters:  08/18/24 66         Passed - Valid encounter within last 6 months    Recent Outpatient Visits           1 month ago S/P cholecystectomy   [place] [facility], MD   5 months ago Acute cough   [employer] [facility] [place] - A Dept Of [place]. [facility] Vicci Barnie NOVAK, MD   7 months ago Essential hypertension   [place] [facility] [place] - A Dept Of [place]. [facility] Theotis Haze ORN, NP   1 year ago Diabetes mellitus treated with oral medication [facility])   [place] [facility] - A Dept Of [place]. [facility] Vicci Barnie NOVAK, MD   1 year ago Viral illness   [place] [facility] [place] - A Dept Of [place]. [facility] Vicci Barnie NOVAK, MD               omeprazole  (PRILOSEC) 20 MG capsule 180 capsule 0     Gastroenterology: Proton Pump Inhibitors Passed - 10/12/2024  3:23 PM      Passed - Valid encounter within last 12 months    Recent Outpatient Visits           1 month ago S/P cholecystectomy   [place] [facility], MD   5 months ago Acute cough   [place] [facility] [place] - A Dept Of [place]. [facility] Vicci Barnie NOVAK, MD   7 months ago Essential  hypertension   [place] [facility] [place] - A Dept Of [place]. [facility] Theotis Haze ORN, NP   1 year ago Diabetes mellitus treated with oral medication [facility])   [place] [facility] - A Dept Of [place]. [facility] Vicci Barnie NOVAK, MD   1 year ago Viral illness   [place] [facility] [place] - A Dept Of . [facility] Vicci Barnie NOVAK, MD

## 2024-10-16 ENCOUNTER — Ambulatory Visit: Payer: Self-pay | Admitting: Internal Medicine

## 2024-10-16 ENCOUNTER — Other Ambulatory Visit: Payer: Self-pay | Admitting: Internal Medicine

## 2024-10-16 ENCOUNTER — Other Ambulatory Visit: Payer: Self-pay | Admitting: Surgery

## 2024-10-16 DIAGNOSIS — R1031 Right lower quadrant pain: Secondary | ICD-10-CM

## 2024-10-16 DIAGNOSIS — E1169 Type 2 diabetes mellitus with other specified complication: Secondary | ICD-10-CM

## 2024-10-17 ENCOUNTER — Other Ambulatory Visit: Payer: Self-pay | Admitting: Internal Medicine

## 2024-10-17 DIAGNOSIS — R051 Acute cough: Secondary | ICD-10-CM

## 2024-10-17 DIAGNOSIS — J302 Other seasonal allergic rhinitis: Secondary | ICD-10-CM

## 2024-10-23 ENCOUNTER — Inpatient Hospital Stay
Admission: RE | Admit: 2024-10-23 | Discharge: 2024-10-23 | Disposition: A | Source: Ambulatory Visit | Attending: Surgery

## 2024-10-23 DIAGNOSIS — R1031 Right lower quadrant pain: Secondary | ICD-10-CM

## 2024-10-23 MED ORDER — IOPAMIDOL (ISOVUE-300) INJECTION 61%
100.0000 mL | Freq: Once | INTRAVENOUS | Status: AC | PRN
  Administered 2024-10-23: 100 mL via INTRAVENOUS

## 2024-11-02 ENCOUNTER — Other Ambulatory Visit: Payer: Self-pay | Admitting: Internal Medicine

## 2024-11-02 DIAGNOSIS — F411 Generalized anxiety disorder: Secondary | ICD-10-CM

## 2024-11-07 ENCOUNTER — Other Ambulatory Visit: Payer: Self-pay | Admitting: Internal Medicine

## 2024-11-07 DIAGNOSIS — K5909 Other constipation: Secondary | ICD-10-CM

## 2024-11-08 ENCOUNTER — Other Ambulatory Visit: Payer: Self-pay | Admitting: Internal Medicine

## 2024-11-08 DIAGNOSIS — K219 Gastro-esophageal reflux disease without esophagitis: Secondary | ICD-10-CM

## 2024-11-10 DIAGNOSIS — J3089 Other allergic rhinitis: Secondary | ICD-10-CM

## 2024-12-02 ENCOUNTER — Other Ambulatory Visit: Payer: Self-pay | Admitting: Internal Medicine

## 2024-12-02 DIAGNOSIS — F411 Generalized anxiety disorder: Secondary | ICD-10-CM

## 2024-12-07 ENCOUNTER — Ambulatory Visit: Admitting: Internal Medicine

## 2025-01-07 ENCOUNTER — Ambulatory Visit: Payer: Self-pay | Admitting: Internal Medicine
